# Patient Record
Sex: Male | Born: 1935 | Race: White | Hispanic: No | Smoking: Never smoker
Health system: Southern US, Community
[De-identification: ages and names within clinical notes are randomized; demographics above are authoritative.]

## PROBLEM LIST (undated history)

## (undated) DIAGNOSIS — E785 Hyperlipidemia, unspecified: Secondary | ICD-10-CM

## (undated) DIAGNOSIS — I499 Cardiac arrhythmia, unspecified: Secondary | ICD-10-CM

## (undated) DIAGNOSIS — C4490 Unspecified malignant neoplasm of skin, unspecified: Secondary | ICD-10-CM

## (undated) DIAGNOSIS — I2510 Atherosclerotic heart disease of native coronary artery without angina pectoris: Secondary | ICD-10-CM

## (undated) DIAGNOSIS — K219 Gastro-esophageal reflux disease without esophagitis: Secondary | ICD-10-CM

## (undated) DIAGNOSIS — I639 Cerebral infarction, unspecified: Secondary | ICD-10-CM

## (undated) DIAGNOSIS — E119 Type 2 diabetes mellitus without complications: Secondary | ICD-10-CM

## (undated) DIAGNOSIS — N200 Calculus of kidney: Secondary | ICD-10-CM

## (undated) DIAGNOSIS — L570 Actinic keratosis: Secondary | ICD-10-CM

## (undated) DIAGNOSIS — N400 Enlarged prostate without lower urinary tract symptoms: Secondary | ICD-10-CM

## (undated) DIAGNOSIS — N289 Disorder of kidney and ureter, unspecified: Secondary | ICD-10-CM

## (undated) DIAGNOSIS — I10 Essential (primary) hypertension: Secondary | ICD-10-CM

## (undated) HISTORY — DX: Gastro-esophageal reflux disease without esophagitis: K21.9

## (undated) HISTORY — DX: Benign prostatic hyperplasia without lower urinary tract symptoms: N40.0

## (undated) HISTORY — DX: Type 2 diabetes mellitus without complications: E11.9

## (undated) HISTORY — PX: OTHER SURGICAL HISTORY: SHX169

## (undated) HISTORY — DX: Hyperlipidemia, unspecified: E78.5

## (undated) HISTORY — DX: Disorder of kidney and ureter, unspecified: N28.9

## (undated) HISTORY — DX: Unspecified malignant neoplasm of skin, unspecified: C44.90

## (undated) HISTORY — DX: Cardiac arrhythmia, unspecified: I49.9

## (undated) HISTORY — DX: Cerebral infarction, unspecified: I63.9

## (undated) HISTORY — DX: Calculus of kidney: N20.0

## (undated) HISTORY — DX: Actinic keratosis: L57.0

## (undated) HISTORY — DX: Atherosclerotic heart disease of native coronary artery without angina pectoris: I25.10

## (undated) HISTORY — DX: Essential (primary) hypertension: I10

---

## 2000-06-29 ENCOUNTER — Encounter: Payer: Self-pay | Admitting: Gastroenterology

## 2000-06-29 ENCOUNTER — Ambulatory Visit (HOSPITAL_COMMUNITY): Admission: RE | Admit: 2000-06-29 | Discharge: 2000-06-29 | Payer: Self-pay | Admitting: Gastroenterology

## 2001-06-24 ENCOUNTER — Encounter: Payer: Self-pay | Admitting: Orthopedic Surgery

## 2001-06-24 ENCOUNTER — Encounter: Admission: RE | Admit: 2001-06-24 | Discharge: 2001-06-24 | Payer: Self-pay | Admitting: Orthopedic Surgery

## 2003-06-20 ENCOUNTER — Other Ambulatory Visit: Payer: Self-pay

## 2004-01-24 ENCOUNTER — Emergency Department: Payer: Self-pay | Admitting: Emergency Medicine

## 2004-01-24 ENCOUNTER — Other Ambulatory Visit: Payer: Self-pay

## 2004-01-31 ENCOUNTER — Ambulatory Visit: Payer: Self-pay | Admitting: Specialist

## 2004-04-21 ENCOUNTER — Emergency Department: Payer: Self-pay | Admitting: General Practice

## 2004-08-15 ENCOUNTER — Ambulatory Visit: Payer: Self-pay | Admitting: Specialist

## 2004-09-10 ENCOUNTER — Ambulatory Visit: Payer: Self-pay | Admitting: Specialist

## 2005-10-08 ENCOUNTER — Ambulatory Visit: Payer: Self-pay | Admitting: Gastroenterology

## 2006-12-17 ENCOUNTER — Other Ambulatory Visit: Payer: Self-pay

## 2006-12-17 ENCOUNTER — Emergency Department: Payer: Self-pay | Admitting: Emergency Medicine

## 2007-02-11 DIAGNOSIS — I2510 Atherosclerotic heart disease of native coronary artery without angina pectoris: Secondary | ICD-10-CM

## 2007-02-11 HISTORY — PX: CORONARY ARTERY BYPASS GRAFT: SHX141

## 2007-02-11 HISTORY — DX: Atherosclerotic heart disease of native coronary artery without angina pectoris: I25.10

## 2007-06-10 ENCOUNTER — Ambulatory Visit: Payer: Self-pay | Admitting: Urology

## 2007-09-20 ENCOUNTER — Encounter: Payer: Self-pay | Admitting: Cardiovascular Disease

## 2007-09-21 ENCOUNTER — Encounter: Payer: Self-pay | Admitting: Cardiovascular Disease

## 2007-09-22 ENCOUNTER — Encounter: Payer: Self-pay | Admitting: Cardiovascular Disease

## 2007-09-22 ENCOUNTER — Inpatient Hospital Stay: Payer: Self-pay | Admitting: Cardiovascular Disease

## 2007-10-27 ENCOUNTER — Encounter: Payer: Self-pay | Admitting: Cardiovascular Disease

## 2007-11-17 ENCOUNTER — Encounter: Payer: Self-pay | Admitting: Cardiovascular Disease

## 2007-12-12 ENCOUNTER — Encounter: Payer: Self-pay | Admitting: Cardiovascular Disease

## 2007-12-28 ENCOUNTER — Encounter: Payer: Self-pay | Admitting: Cardiovascular Disease

## 2008-01-11 ENCOUNTER — Emergency Department: Payer: Self-pay | Admitting: Emergency Medicine

## 2008-01-13 ENCOUNTER — Encounter: Payer: Self-pay | Admitting: Cardiovascular Disease

## 2008-01-17 ENCOUNTER — Encounter: Payer: Self-pay | Admitting: Cardiovascular Disease

## 2008-02-11 ENCOUNTER — Encounter: Payer: Self-pay | Admitting: Cardiovascular Disease

## 2009-02-26 ENCOUNTER — Encounter: Payer: Self-pay | Admitting: Cardiovascular Disease

## 2009-06-13 ENCOUNTER — Encounter: Payer: Self-pay | Admitting: Cardiovascular Disease

## 2009-11-29 ENCOUNTER — Encounter: Payer: Self-pay | Admitting: Cardiovascular Disease

## 2009-12-11 ENCOUNTER — Ambulatory Visit: Payer: Self-pay | Admitting: Cardiovascular Disease

## 2009-12-11 DIAGNOSIS — I2510 Atherosclerotic heart disease of native coronary artery without angina pectoris: Secondary | ICD-10-CM | POA: Insufficient documentation

## 2009-12-11 DIAGNOSIS — I25810 Atherosclerosis of coronary artery bypass graft(s) without angina pectoris: Secondary | ICD-10-CM | POA: Insufficient documentation

## 2009-12-11 DIAGNOSIS — E785 Hyperlipidemia, unspecified: Secondary | ICD-10-CM | POA: Insufficient documentation

## 2009-12-11 DIAGNOSIS — I10 Essential (primary) hypertension: Secondary | ICD-10-CM | POA: Insufficient documentation

## 2010-01-10 ENCOUNTER — Ambulatory Visit: Payer: Self-pay | Admitting: Cardiovascular Disease

## 2010-01-14 LAB — CONVERTED CEMR LAB
ALT: 27 units/L (ref 0–53)
AST: 28 units/L (ref 0–37)
Albumin: 4.4 g/dL (ref 3.5–5.2)
Alkaline Phosphatase: 64 units/L (ref 39–117)
Bilirubin, Direct: 0.1 mg/dL (ref 0.0–0.3)
Cholesterol: 125 mg/dL (ref 0–200)
HDL: 36 mg/dL — ABNORMAL LOW (ref >39)
Indirect Bilirubin: 0.5 mg/dL (ref 0.0–0.9)
LDL Cholesterol: 63 mg/dL (ref 0–99)
Total Bilirubin: 0.6 mg/dL (ref 0.3–1.2)
Total CHOL/HDL Ratio: 3.5
Total Protein: 7.2 g/dL (ref 6.0–8.3)
Triglycerides: 131 mg/dL (ref <150)
VLDL: 26 mg/dL (ref 0–40)

## 2010-03-12 NOTE — Letter (Signed)
Summary: Medical Record Release  Medical Record Release   Imported By: Harlon Flor 11/29/2009 11:47:08  _____________________________________________________________________  External Attachment:    Type:   Image     Comment:   External Document

## 2010-03-12 NOTE — Letter (Signed)
Summary: Medical Record Release  Medical Record Release   Imported By: Harlon Flor 12/19/2009 16:12:22  _____________________________________________________________________  External Attachment:    Type:   Image     Comment:   External Document

## 2010-03-12 NOTE — Cardiovascular Report (Signed)
[facility]   Imported By: Roderic Ovens 12/27/2009 17:34:18  _____________________________________________________________________  External Attachment:    Type:   Image     Comment:   External Document

## 2010-03-12 NOTE — Assessment & Plan Note (Signed)
Summary: NP6/AMD   Visit Type:  Initial Consult   History of Present Illness: Randy Frazier is a very pleasant 77 year old gentleman with a history of coronary artery disease, bypass surgery in 2009 at [facility], history of hyperlipidemia and hypertension who presents for new patient evaluation.  Randy Frazier states that he was seen previously by the outside cardiologist and would like to change to her group for his continued care.  Cardiac catheterization notes from August 2009 shows a proximal left main lesion of 40%, proximal LAD lesion 60%, mid LAD lesion 95%, distal LAD lesion 70%, proximal circumflex and 60% followed by a 90% lesion with 99% proximal RCA disease, 70% distal RCA disease, 100% stenosis of the PDA vessel. He was transferred to [facility] for bypass surgery previous records are not available to [place] at this time.  Since his surgery, he reports that he's been doing well. He leads a sedentary lifestyle. His blood pressure has been relatively well controlled with systolic pressures in the low 161W to high 130s. He had some muscle cramping on lovastatin and one month ago changed to pravastatin.  Echocardiogram from December 2009 shows mild mitral regurgitation, mild pulmonic regurgitation, trace TR, otherwise normal LV size and systolic function with mild LVH and mild diastolic dysfunction.  Stress test in August 2000 Prior to his bypass surgery shows ischemia in the inferolateral wall, ejection fraction 73%.  EKG shows normal sinus rhythm with rate 61 beats per minute, right bundle branch block, no significant ST-T wave changes  Labs from January 2000 and shows hemoglobin A1c 8.2, total cholesterol 102, LDL 44, HDL 29, creatinine 1.69, BUN 30  Preventive Screening-Counseling & Management  Alcohol-Tobacco     Smoking Status: never  Caffeine-Diet-Exercise     Does Patient Exercise: no      Drug Use:  no.    Current Medications (verified): 1)  Aspirin 325 Mg Tabs (Aspirin)  .... One Tablet Once Daily 2)  Coq10 50 Mg Caps (Coenzyme Q10) .... One Tablet Once Daily 3)  Enalapril Maleate 10 Mg Tabs (Enalapril Maleate) .... One Tablet Once Daily 4)  Magnesium 250 Mg Tabs (Magnesium) .... One Tablet Once Daily 5)  Metformin Hcl 500 Mg Tabs (Metformin Hcl) .... One Tablet Two Times A Day 6)  Metoprolol Succinate 50 Mg Xr24h-Tab (Metoprolol Succinate) .... One Tablet Once Daily 7)  Omeprazole 40 Mg Cpdr (Omeprazole) .... One Tablet Once Daily 8)  Pravastatin Sodium 40 Mg Tabs (Pravastatin Sodium) .... One Tablet At Bedtime 9)  Amlodipine Besylate 10 Mg Tabs (Amlodipine Besylate) .... One Tablet Once Daily  Allergies (verified): 1)  ! Ibuprofen 2)  ! Penicillin  Past History:  Family History: Last updated: 2009-12-23 Mother: Deceased @ age 40 with cancer Father: Deceased @ age 28 with hemorrhage  Social History: Last updated: 12-23-09 Retired  Married  Tobacco Use - No.  Alcohol Use - no Regular Exercise - no Drug Use - no  Risk Factors: Exercise: no (Dec 23, 2009)  Risk Factors: Smoking Status: never (2009-12-23)  Past Medical History: A-Fib CAD; CABG x 3 @ [facility] Hypertension GERD Renal disorder 1.69 creatinine Enlarged prostate  Past Surgical History: CABG x 3 @ [facility]; 2009   Family History: Mother: Deceased @ age 73 with cancer Father: Deceased @ age 23 with hemorrhage  Social History: Retired  Married  Tobacco Use - No.  Alcohol Use - no Regular Exercise - no Drug Use - no Smoking Status:  never Does Patient Exercise:  no Drug Use:  no  Review of Systems  The patient denies fever, weight loss, weight gain, vision loss, decreased hearing, hoarseness, chest pain, syncope, dyspnea on exertion, peripheral edema, prolonged cough, abdominal pain, incontinence, muscle weakness, depression, and enlarged lymph nodes.    Vital Signs:  Patient profile:   77 year old male Height:      72 inches Weight:      180 pounds BMI:      24.50 Pulse rate:   68 / minute BP sitting:   151 / 85  (left arm) Cuff size:   regular  Vitals Entered By: Bishop Dublin, CMA (December 11, 2009 10:37 AM)  Physical Exam  General:  Well developed, well nourished, in no acute distress. Head:  normocephalic and atraumatic Neck:  Neck supple, no JVD. No masses, thyromegaly or abnormal cervical nodes. Lungs:  Clear bilaterally to auscultation and percussion. Heart:  Non-displaced PMI, chest non-tender; regular rate and rhythm, S1, S2 without murmurs, rubs or gallops. Carotid upstroke normal, no bruit. Normal abdominal aortic size, no bruits. Pedals normal pulses. No edema, no varicosities. Abdomen:  Bowel sounds positive; abdomen soft and non-tender  Msk:  Back normal, normal gait. Muscle strength and tone normal. Pulses:  pulses normal in all 4 extremities Extremities:  No clubbing or cyanosis. Neurologic:  Alert and oriented x 3. Skin:  Intact without lesions or rashes. Psych:  Normal affect.   Impression & Recommendations:  Problem # 1:  CAD, AUTOLOGOUS BYPASS GRAFT (ICD-414.02) severe three-vessel CAD by catheterization in 2009 with bypass surgery at [facility] at that time. No symptoms over the past 2 years. We'll continue aggressive medical management.  His updated medication list for this problem includes:    Aspirin 81 Mg Tbec (Aspirin) .[name]... Take 2  tablets by mouth daily    Enalapril Maleate 10 Mg Tabs (Enalapril maleate) ..... One tablet once daily    Metoprolol Succinate 50 Mg Xr24h-tab (Metoprolol succinate) ..... One tablet once daily    Amlodipine Besylate 10 Mg Tabs (Amlodipine besylate) ..... One tablet once daily  Problem # 2:  HYPERLIPIDEMIA-MIXED (ICD-272.4) Goal LDL is less than 70. We will have him continue on pravastatin for one more month and check his cholesterol at that time. It is not goal, we can change him to Lipitor.  The following medications were removed from the medication list:    Lovastatin 40 Mg  Tabs (Lovastatin) ..... One tablet at bedtime His updated medication list for this problem includes:    Pravastatin Sodium 40 Mg Tabs (Pravastatin sodium) ..... One tablet at bedtime  Problem # 3:  HYPERTENSION, BENIGN (ICD-401.1) I have asked him to monitor his blood pressure. He comments that his stock pressures are typically 140.  His updated medication list for this problem includes:    Aspirin 81 Mg Tbec (Aspirin) .[name]... Take 2  tablets by mouth daily    Enalapril Maleate 10 Mg Tabs (Enalapril maleate) ..... One tablet once daily    Metoprolol Succinate 50 Mg Xr24h-tab (Metoprolol succinate) ..... One tablet once daily    Amlodipine Besylate 10 Mg Tabs (Amlodipine besylate) ..... One tablet once daily  Other Orders: EKG w/ Interpretation (93000)  Patient Instructions: 1)  Your physician recommends that you return for a FASTING lipid profile: 1 month (lipid/lft) 2)  Your physician has recommended you make the following change in your medication: Decrease aspirin 81mg  take 2 tabs daily 3)  Your physician wants you to follow-up in:   6 months You will receive a reminder letter in  the mail two months in advance. If you don't receive a letter, please call our office to schedule the follow-up appointment.

## 2010-05-20 ENCOUNTER — Other Ambulatory Visit: Payer: Self-pay | Admitting: Emergency Medicine

## 2010-05-20 MED ORDER — AMLODIPINE BESYLATE 10 MG PO TABS: 10.0000 mg | ORAL_TABLET | Freq: Every day | ORAL | Status: DC

## 2010-06-19 ENCOUNTER — Encounter: Payer: Self-pay | Admitting: Cardiovascular Disease

## 2010-06-19 ENCOUNTER — Ambulatory Visit (INDEPENDENT_AMBULATORY_CARE_PROVIDER_SITE_OTHER): Payer: Medicare Other | Admitting: Cardiovascular Disease

## 2010-06-19 DIAGNOSIS — E785 Hyperlipidemia, unspecified: Secondary | ICD-10-CM

## 2010-06-19 DIAGNOSIS — I10 Essential (primary) hypertension: Secondary | ICD-10-CM

## 2010-06-19 DIAGNOSIS — E119 Type 2 diabetes mellitus without complications: Secondary | ICD-10-CM | POA: Insufficient documentation

## 2010-06-19 DIAGNOSIS — I2510 Atherosclerotic heart disease of native coronary artery without angina pectoris: Secondary | ICD-10-CM

## 2010-06-19 DIAGNOSIS — I25810 Atherosclerosis of coronary artery bypass graft(s) without angina pectoris: Secondary | ICD-10-CM

## 2010-06-19 MED ORDER — ENALAPRIL MALEATE 20 MG PO TABS: 20.0000 mg | ORAL_TABLET | Freq: Every day | ORAL | Status: DC

## 2010-06-19 NOTE — Assessment & Plan Note (Signed)
Blood pressure is well controlled on today's visit. No changes made to the medications. He would like to change his p.m. Enalapril to AM. We'll change the a.m. Dose to 20 mg daily.

## 2010-06-19 NOTE — Progress Notes (Signed)
Patient ID: Randy Frazier, male    DOB: 03-18-1927, 77 y.o.   MRN: [id number]  HPI Comments: Randy Frazier is a very pleasant 77 year old gentleman with a history of coronary artery disease, bypass surgery in 2009 at [facility], poorly controlled diabetes, history of hyperlipidemia and hypertension who presents for routine followup.   Since his surgery, he reports that he's been doing well. He leads a sedentary lifestyle. His blood pressure has been relatively well controlled with systolic pressures in the low 409W to high 130s. He had some muscle cramping on lovastatin and  changed to pravastatin. On pravastatin, he has without muscle ache and has cut the pill in half. He does not exercise and reports eating poorly. He believes his diabetes numbers will be elevated. He also reports having vivid dreams at night and wonders if it could be one of the medications.   Cardiac catheterization notes from August 2009 shows a proximal left main lesion of 40%, proximal LAD lesion 60%, mid LAD lesion 95%, distal LAD lesion 70%, proximal circumflex and 60% followed by a 90% lesion with 99% proximal RCA disease, 70% distal RCA disease, 100% stenosis of the PDA vessel. He was transferred to [facility] for bypass surgery previous records are not available to [place] at this time.   Echocardiogram from December 2009 shows mild mitral regurgitation, mild pulmonic regurgitation, trace TR, otherwise normal LV size and systolic function with mild LVH and mild diastolic dysfunction.   EKG shows normal sinus rhythm with rate 58 beats per minute, right bundle branch block, no significant ST-T wave changes   Labs from January 2011 and shows hemoglobin A1c 8.2, total cholesterol 102, LDL 44, HDL 29, creatinine 1.69, BUN 30      Review of Systems  Constitutional: Negative.        Nightmares when sleeping at night.  HENT: Negative.   Eyes: Negative.   Respiratory: Negative.   Cardiovascular: Negative.     Gastrointestinal: Negative.   Musculoskeletal: Negative.   Skin: Negative.   Neurological: Negative.   Hematological: Negative.   Psychiatric/Behavioral: Negative.   All other systems reviewed and are negative.   BP 118/68  Pulse 58  Ht 6' (1.829 m)  Wt 179 lb 12.8 oz (81.557 kg)  BMI 24.39 kg/m2   Physical Exam  Nursing note and vitals reviewed. Constitutional: He is oriented to person, place, and time. He appears well-developed and well-nourished.  HENT:  Head: Normocephalic.  Nose: Nose normal.  Mouth/Throat: Oropharynx is clear and moist.  Eyes: Conjunctivae are normal. Pupils are equal, round, and reactive to light.  Neck: Normal range of motion. Neck supple. No JVD present.  Cardiovascular: Normal rate, regular rhythm, S1 normal, S2 normal, normal heart sounds and intact distal pulses.  Exam reveals no gallop and no friction rub.   No murmur heard. Pulmonary/Chest: Effort normal and breath sounds normal. No respiratory distress. He has no wheezes. He has no rales. He exhibits no tenderness.  Abdominal: Soft. Bowel sounds are normal. He exhibits no distension. There is no tenderness.  Musculoskeletal: Normal range of motion. He exhibits no edema and no tenderness.  Lymphadenopathy:    He has no cervical adenopathy.  Neurological: He is alert and oriented to person, place, and time. Coordination normal.  Skin: Skin is warm and dry. No rash noted. No erythema.  Psychiatric: He has a normal mood and affect. His behavior is normal. Judgment and thought content normal.  Assessment and Plan

## 2010-06-19 NOTE — Assessment & Plan Note (Signed)
We have encouraged continued exercise, careful diet management in an effort to lose weight. He needs to check his sugars at home. He reports that he has the supplies that does not do it. He also eats junk food.

## 2010-06-19 NOTE — Patient Instructions (Signed)
Your physician has recommended you make the following change in your medication: Change Enalapril to 20mg  in AM only. We will send this Rx to [facility] pharmacy.  Your physician recommends that you return for a FASTING lipid profile: in 1 week (lipid/lft/HgB A1c/BMP)  Your physician recommends that you schedule a follow-up appointment in: 6 months

## 2010-06-19 NOTE — Assessment & Plan Note (Signed)
We'll schedule a cholesterol check for next week. We'll also check hemoglobin A1c

## 2010-06-19 NOTE — Assessment & Plan Note (Signed)
Currently with no symptoms of angina. No further workup at this time. Continue current medication regimen. 

## 2010-06-26 ENCOUNTER — Other Ambulatory Visit (INDEPENDENT_AMBULATORY_CARE_PROVIDER_SITE_OTHER): Payer: Medicare Other | Admitting: *Deleted

## 2010-06-26 DIAGNOSIS — E119 Type 2 diabetes mellitus without complications: Secondary | ICD-10-CM

## 2010-06-26 DIAGNOSIS — E785 Hyperlipidemia, unspecified: Secondary | ICD-10-CM

## 2010-06-26 DIAGNOSIS — I2510 Atherosclerotic heart disease of native coronary artery without angina pectoris: Secondary | ICD-10-CM

## 2010-06-26 DIAGNOSIS — Z79899 Other long term (current) drug therapy: Secondary | ICD-10-CM

## 2010-06-26 LAB — BASIC METABOLIC PANEL
CO2: 24 mEq/L (ref 19–32)
Glucose, Bld: 129 mg/dL — ABNORMAL HIGH (ref 70–99)
Potassium: 4.8 mEq/L (ref 3.5–5.3)
Sodium: 137 mEq/L (ref 135–145)

## 2010-06-26 LAB — LIPID PANEL
Cholesterol: 137 mg/dL (ref 0–200)
HDL: 36 mg/dL — ABNORMAL LOW (ref >39)
Total CHOL/HDL Ratio: 3.8 Ratio
Triglycerides: 123 mg/dL (ref <150)
VLDL: 25 mg/dL (ref 0–40)

## 2010-06-26 LAB — HEPATIC FUNCTION PANEL
ALT: 27 U/L (ref 0–53)
AST: 34 U/L (ref 0–37)
Albumin: 4 g/dL (ref 3.5–5.2)
Total Protein: 6.6 g/dL (ref 6.0–8.3)

## 2010-06-27 LAB — HEMOGLOBIN A1C: Hgb A1c MFr Bld: 7.2 % — ABNORMAL HIGH (ref <5.7)

## 2010-06-28 NOTE — Procedures (Signed)
[place]  Patient:    EKANSH, SHERK.                  MRN: [id number] Proc. Date: 07/01/00 Attending:  Verlin Grills, M.D. CC:         [facility], [place]   Procedure Report  PROCEDURE:  Esophagogastroduodenoscopy with Savary esophageal dilation.  PROCEDURE INDICATION:  Mr. Terrez Ander (date of birth 03-23-1927) is a 77 year old male with chronic heartburn.  He takes his proton pump inhibitor intermittently.  He is complaining of heartburn and nausea.  I discussed with Mr. Gelb the complications associated with esophagogastroduodenoscopy and possible Savary esophageal dilation, including intestinal bleeding and intestinal perforation.  Mr. Tendler has signed the operative permit.  ENDOSCOPIST:  Verlin Grills, M.D.  PREMEDICATION:  Fentanyl 50 mcg, Versed 6 mg.  ENDOSCOPE:  Olympus gastroscope and the Savary 16 mm dilator.  DESCRIPTION OF PROCEDURE:  After obtaining informed consent, the patient was placed in the left lateral decubitus position.  I administered intravenous fentanyl and intravenous Versed to achieve conscious sedation for the procedure.  The patients blood pressure, oxygen saturation, and cardiac rhythm were monitored throughout the procedure and documented in the medial record.  The Olympus gastroscope was passed through the posterior hypopharynx into the proximal esophagus without difficulty.  The hypopharynx, larynx, and vocal cords appeared normal.  Esophagoscopy:  The proximal and midsegments of the esophagus appeared normal. 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  With steady light pressure, the mucosal stricture was slightly dilated with the gastroscope, allowing the gastroscope to enter the proximal stomach.  There is circumferential erosive esophagitis at the esophagogastric junction.  There is no ulceration of the  esophagus.  Gastroscopy:  There is a moderate-sized hiatal hernia.  Retroflexed view of the gastric cardia and fundus was normal.  The gastric body, antrum, and pylorus appeared normal.  Duodenoscopy:  There are scattered erosions in the duodenal bulb without ulceration; the descending duodenum appeared normal.  Savary esophageal dilation:  The Savary dilator wire was passed through the endoscope and the tip of the guidewire advanced to the distal gastric antrum as confirmed endoscopically and fluoroscopically.  Under fluoroscopic guidance, the 16 mm Savary dilator passed with minimal resistance.  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  RECOMMENDATIONS:  I have encouraged Mr. Burmester to take his proton pump inhibitor on a daily basis to prevent heartburn and hopefully relieve his nausea. DD:  06/29/00 TD:  06/30/00 Job: [id number]

## 2010-06-30 ENCOUNTER — Encounter: Payer: Self-pay | Admitting: Cardiovascular Disease

## 2010-07-15 ENCOUNTER — Telehealth: Payer: Self-pay | Admitting: Emergency Medicine

## 2010-07-15 NOTE — Telephone Encounter (Signed)
Pt called and left a message stating that he has questions about his pravastatin. Tried to call patient and LMOM TCB

## 2010-07-16 NOTE — Telephone Encounter (Signed)
Pt called the office regarding his pravastatin and stated that he has bad muscle cramps. He said that he is going to stop this medication due to this problem. Also he stated that you mentioned trying him on lipitor(generic) and he wanted to know if he can switch to this medication. If so what dose? Please advise, Thanks, Leemon Ayala

## 2010-07-18 NOTE — Telephone Encounter (Signed)
Would start lowest dose to start 10 mg daily Would check cholesterol in 2 months

## 2010-07-19 NOTE — Telephone Encounter (Signed)
Attempted to call pt, no answer. x1

## 2010-07-23 NOTE — Telephone Encounter (Signed)
Pt called back stating the medication Dr. Leavy Cella put him on was Mirapex (Ropinirole HCL) usually used for Parkinson's dz, he wants him to try this prior to restarting chol med to see if improves muscle spasms. Please advise.

## 2010-07-23 NOTE — Telephone Encounter (Signed)
Spoke to pt this AM, he states he saw Dr. Leavy Cella in the meantime, and was told to hold Pravastatin for 30 days. Dr. Leavy Cella prescribed a medication (pt unsure of name he has not picked up from pharmacy yet) to help with his muscle cramps. Pt states that since stopping Pravastatin his cramps have worsened and he is having spasms "all over body." Pt does not want to decr dose, he does want to hold for now and he states he will call me back to let me know what medication he has started. Notified him I would discuss with Dr. Mariah Milling after we know what he is taking.

## 2010-07-24 NOTE — Telephone Encounter (Signed)
Sounds like he can try the other medication first. Will defer to Dr. Leavy Cella.

## 2010-07-25 NOTE — Telephone Encounter (Signed)
Noted. Will follow up with pt to find out if his symptoms have improved after a few weeks.

## 2010-08-27 NOTE — Telephone Encounter (Signed)
Spoke to pt after he has been on Mirapex as mentioned below, his muscle spasms have improved some but not gone. Told pt that this shows it was not the chol med causing most likely, and advised he restart Pravastatin 40mg  again, may want to try 1/2 tab to start. He says Dr. Mariah Milling had also mentioned in the past trying Lipitor 10mg , and would like to see if he recommends instead of restarting prava. Please advise.

## 2010-08-28 NOTE — Telephone Encounter (Signed)
He could try either pravastatin or lipitor 10 mg daily

## 2010-08-29 NOTE — Telephone Encounter (Signed)
Attempted to contact pt, LMOM TCB. Please call pt and notify. Thanks.

## 2010-08-30 NOTE — Telephone Encounter (Signed)
LMOMTCB

## 2010-09-03 ENCOUNTER — Other Ambulatory Visit: Payer: Self-pay

## 2010-09-03 MED ORDER — ROSUVASTATIN CALCIUM 5 MG PO TABS: 5.0000 mg | ORAL_TABLET | Freq: Every day | ORAL | Status: DC

## 2010-09-03 NOTE — Telephone Encounter (Signed)
Notified Dr. Mariah Milling the patient is having cramping from the pravastatin.  Dr. Mariah Milling had patient pick up crestor 5 mg samples to take one tablet daily.

## 2010-10-23 ENCOUNTER — Telehealth: Payer: Self-pay | Admitting: Cardiovascular Disease

## 2010-10-23 NOTE — Telephone Encounter (Signed)
Please see below.

## 2010-10-23 NOTE — Telephone Encounter (Signed)
Pt wants to have a script for Lipitor (generic 10mg ). Pt uses [facility] [address]

## 2010-10-28 ENCOUNTER — Telehealth: Payer: Self-pay

## 2010-10-28 NOTE — Telephone Encounter (Signed)
Duplicate message. 

## 2010-11-05 NOTE — Telephone Encounter (Signed)
Please advise if okay to take generic lipitor. If so, what dose do you recommend? If okay send to [facility].

## 2010-11-06 NOTE — Telephone Encounter (Signed)
Would start lipitor 10 mg. Check lipids in 3 months May need 20 mg daily to match crestor numbers from May

## 2010-11-07 ENCOUNTER — Other Ambulatory Visit: Payer: Self-pay

## 2010-11-07 DIAGNOSIS — E785 Hyperlipidemia, unspecified: Secondary | ICD-10-CM

## 2010-11-07 DIAGNOSIS — I2510 Atherosclerotic heart disease of native coronary artery without angina pectoris: Secondary | ICD-10-CM

## 2010-11-07 MED ORDER — ATORVASTATIN CALCIUM 10 MG PO TABS: 10.0000 mg | ORAL_TABLET | Freq: Every day | ORAL | Status: DC

## 2010-11-07 NOTE — Telephone Encounter (Signed)
Notified patient need to start on lipitor 10 mg one tablet daily.  Reck liv/lip in three months.

## 2010-12-12 ENCOUNTER — Other Ambulatory Visit: Payer: Self-pay

## 2010-12-12 MED ORDER — AMLODIPINE BESYLATE 10 MG PO TABS: 10.0000 mg | ORAL_TABLET | Freq: Every day | ORAL | Status: DC

## 2011-02-01 ENCOUNTER — Other Ambulatory Visit: Payer: Self-pay | Admitting: Cardiovascular Disease

## 2011-02-03 ENCOUNTER — Telehealth: Payer: Self-pay

## 2011-02-03 MED ORDER — ENALAPRIL MALEATE 20 MG PO TABS: 20.0000 mg | ORAL_TABLET | Freq: Every day | ORAL | Status: DC

## 2011-02-03 NOTE — Telephone Encounter (Signed)
Refill sent for enalapril  

## 2011-02-06 ENCOUNTER — Ambulatory Visit (INDEPENDENT_AMBULATORY_CARE_PROVIDER_SITE_OTHER): Payer: Medicare Other | Admitting: *Deleted

## 2011-02-06 DIAGNOSIS — I2510 Atherosclerotic heart disease of native coronary artery without angina pectoris: Secondary | ICD-10-CM

## 2011-02-06 DIAGNOSIS — E785 Hyperlipidemia, unspecified: Secondary | ICD-10-CM

## 2011-02-06 LAB — HEPATIC FUNCTION PANEL
AST: 18 U/L (ref 0–37)
Albumin: 4.3 g/dL (ref 3.5–5.2)
Alkaline Phosphatase: 53 U/L (ref 39–117)
Total Protein: 6.8 g/dL (ref 6.0–8.3)

## 2011-02-06 LAB — HEMOGLOBIN A1C
Hgb A1c MFr Bld: 6.5 % — ABNORMAL HIGH (ref <5.7)
Mean Plasma Glucose: 140 mg/dL — ABNORMAL HIGH (ref <117)

## 2011-02-06 LAB — LIPID PANEL
LDL Cholesterol: 78 mg/dL (ref 0–99)
Total CHOL/HDL Ratio: 4 Ratio

## 2011-02-19 ENCOUNTER — Other Ambulatory Visit: Payer: Self-pay | Admitting: Cardiovascular Disease

## 2011-02-19 DIAGNOSIS — E785 Hyperlipidemia, unspecified: Secondary | ICD-10-CM

## 2011-06-19 DIAGNOSIS — I10 Essential (primary) hypertension: Secondary | ICD-10-CM | POA: Insufficient documentation

## 2011-06-19 DIAGNOSIS — N400 Enlarged prostate without lower urinary tract symptoms: Secondary | ICD-10-CM | POA: Insufficient documentation

## 2011-07-14 ENCOUNTER — Other Ambulatory Visit: Payer: Self-pay | Admitting: Cardiovascular Disease

## 2011-07-14 NOTE — Telephone Encounter (Signed)
Refilled Lipitor

## 2011-07-31 ENCOUNTER — Encounter: Payer: Self-pay | Admitting: Cardiovascular Disease

## 2011-07-31 ENCOUNTER — Ambulatory Visit (INDEPENDENT_AMBULATORY_CARE_PROVIDER_SITE_OTHER): Payer: Medicare Other | Admitting: Cardiovascular Disease

## 2011-07-31 VITALS — BP 102/52 | HR 59 | Ht 72.0 in | Wt 180.5 lb

## 2011-07-31 DIAGNOSIS — I2510 Atherosclerotic heart disease of native coronary artery without angina pectoris: Secondary | ICD-10-CM

## 2011-07-31 DIAGNOSIS — F3289 Other specified depressive episodes: Secondary | ICD-10-CM | POA: Insufficient documentation

## 2011-07-31 DIAGNOSIS — F329 Major depressive disorder, single episode, unspecified: Secondary | ICD-10-CM | POA: Insufficient documentation

## 2011-07-31 DIAGNOSIS — I10 Essential (primary) hypertension: Secondary | ICD-10-CM

## 2011-07-31 DIAGNOSIS — E119 Type 2 diabetes mellitus without complications: Secondary | ICD-10-CM

## 2011-07-31 DIAGNOSIS — E785 Hyperlipidemia, unspecified: Secondary | ICD-10-CM

## 2011-07-31 DIAGNOSIS — I25810 Atherosclerosis of coronary artery bypass graft(s) without angina pectoris: Secondary | ICD-10-CM

## 2011-07-31 NOTE — Patient Instructions (Addendum)
You are doing well. No medication changes were made.  Please call us if you have new issues that need to be addressed before your next appt.  Your physician wants you to follow-up in: 6 months.  You will receive a reminder letter in the mail two months in advance. If you don't receive a letter, please call our office to schedule the follow-up appointment.   

## 2011-07-31 NOTE — Assessment & Plan Note (Signed)
Cholesterol is at goal on the current lipid regimen. No changes to the medications were made.  

## 2011-07-31 NOTE — Assessment & Plan Note (Signed)
We have encouraged continued exercise, careful diet management in an effort to lose weight. 

## 2011-07-31 NOTE — Progress Notes (Signed)
Patient ID: Randy Frazier, male    DOB: 1927/10/30, 78 y.o.   MRN: [id number]  HPI Comments: Mr. Strole is a very pleasant 78 year old gentleman with a history of coronary artery disease, bypass surgery in 2009 at [facility], poorly controlled diabetes, history of hyperlipidemia and hypertension who presents for routine followup.  He reports that he is depressed after the recent loss of his wife in the hospital. She had a stroke, finally succumbed to pneumonia. In the past, he did not tolerate lovastatin secondary to cramping and was changed to pravastatin. He decreased the dose of pravastatin. He does not exercise, eats poorly. He has problems with diabetes secondary to poor diet. In the past he has had vivid dreams. He denies any chest pain or shortness of breath. He does not do very much as he reports having no hobbies since his wife passed away  Recent lab work shows total cholesterol 134, LDL 70, HDL 35 labs from May 2013 Hemoglobin A1c 7.2   Cardiac catheterization notes from August 2009 shows a proximal left main lesion of 40%, proximal LAD lesion 60%, mid LAD lesion 95%, distal LAD lesion 70%, proximal circumflex and 60% followed by a 90% lesion with 99% proximal RCA disease, 70% distal RCA disease, 100% stenosis of the PDA vessel. He was transferred to [facility] for bypass surgery previous records are not available to [place] at this time.   Echocardiogram from December 2009 shows mild mitral regurgitation, mild pulmonic regurgitation, trace TR, otherwise normal LV size and systolic function with mild LVH and mild diastolic dysfunction.   EKG shows normal sinus rhythm with rate 59 beats per minute, right bundle branch block, no significant ST-T wave changes    Outpatient Encounter Prescriptions as of 76/20/2013  Medication Sig Dispense Refill  . amLODipine (NORVASC) 10 MG tablet Take 1 tablet (10 mg total) by mouth daily.  30 tablet  6  . aspirin 162 MG EC tablet 162 mg daily.      [name]  atorvastatin (LIPITOR) 10 MG tablet TAKE 1 TABLET BY MOUTH EVERY DAY  30 tablet  3  . enalapril (VASOTEC) 20 MG tablet Take 1 tablet (20 mg total) by mouth daily with breakfast.  30 tablet  6  . Fluoxetine HCl, PMDD, 20 MG CAPS Take 20 mg by mouth daily.      . metFORMIN (GLUCOPHAGE) 500 MG tablet Take 500 mg by mouth 2 (two) times daily with a meal.        . metoprolol tartrate (LOPRESSOR) 25 MG tablet Take 25 mg by mouth 2 (two) times daily.        [name] omeprazole (PRILOSEC) 40 MG capsule Take 40 mg by mouth daily.        . Tamsulosin HCl (FLOMAX) 0.4 MG CAPS Take 0.4 mg by mouth daily.       Review of Systems  Constitutional: Negative.   HENT: Negative.   Eyes: Negative.   Respiratory: Negative.   Cardiovascular: Negative.   Gastrointestinal: Negative.   Musculoskeletal: Negative.   Skin: Negative.   Neurological: Negative.   Hematological: Negative.   Psychiatric/Behavioral: Positive for dysphoric mood.  All other systems reviewed and are negative.   BP 102/52  Pulse 59  Ht 6' (1.829 m)  Wt 180 lb 8 oz (81.874 kg)  BMI 24.48 kg/m2  Physical Exam  Nursing note and vitals reviewed. Constitutional: He is oriented to person, place, and time. He appears well-developed and well-nourished.  HENT:  Head: Normocephalic.  Nose:  Nose normal.  Mouth/Throat: Oropharynx is clear and moist.  Eyes: Conjunctivae are normal. Pupils are equal, round, and reactive to light.  Neck: Normal range of motion. Neck supple. No JVD present.  Cardiovascular: Normal rate, regular rhythm, S1 normal, S2 normal, normal heart sounds and intact distal pulses.  Exam reveals no gallop and no friction rub.   No murmur heard. Pulmonary/Chest: Effort normal and breath sounds normal. No respiratory distress. He has no wheezes. He has no rales. He exhibits no tenderness.  Abdominal: Soft. Bowel sounds are normal. He exhibits no distension. There is no tenderness.  Musculoskeletal: Normal range of motion. He  exhibits no edema and no tenderness.  Lymphadenopathy:    He has no cervical adenopathy.  Neurological: He is alert and oriented to person, place, and time. Coordination normal.  Skin: Skin is warm and dry. No rash noted. No erythema.  Psychiatric: He has a normal mood and affect. His behavior is normal. Judgment and thought content normal.           Assessment and Plan

## 2011-07-31 NOTE — Assessment & Plan Note (Addendum)
He is currently on Prozac. He does not like being on medications for depression but feels he is very depressed. We have encouraged him to restart exercise program. We have also encouraged him to find new hobbies. He has significant stress from a pending court case with the hospital.

## 2011-07-31 NOTE — Assessment & Plan Note (Signed)
Currently with no symptoms of angina. No further workup at this time. Continue current medication regimen. 

## 2011-07-31 NOTE — Assessment & Plan Note (Signed)
Blood pressure is well controlled on today's visit. No changes made to the medications. 

## 2011-08-08 ENCOUNTER — Other Ambulatory Visit: Payer: Self-pay | Admitting: Cardiovascular Disease

## 2011-08-08 NOTE — Telephone Encounter (Signed)
Refilled Amlodipine.

## 2011-09-01 ENCOUNTER — Other Ambulatory Visit: Payer: Self-pay | Admitting: Cardiovascular Disease

## 2011-09-01 NOTE — Telephone Encounter (Signed)
Refilled Enalapril

## 2011-11-10 ENCOUNTER — Other Ambulatory Visit: Payer: Self-pay | Admitting: Cardiovascular Disease

## 2011-11-10 ENCOUNTER — Telehealth: Payer: Self-pay

## 2011-11-10 NOTE — Telephone Encounter (Signed)
See below as to what I told pt Let me know if I need to do anything different Thanks

## 2011-11-10 NOTE — Telephone Encounter (Signed)
Pt calling with c/o left arm/neck/hand numbness/tingling. He says this has been occuring x 2 days. From elbow to hand, he feels it is "asleep".  He says he is able to get relief when he manipulates area by rubbing on it.  He denies c/o cp, sob, or dizziness. He has a hx of CABG but did not have any symptoms prior to surg. He is asking if he should see Dr. Mariah Milling for this or his PCP. He says he thinks it is a "pinched nerve". I reassured him, since he is able to get relief from movement/manipulation, it is probably non cardiac and should f/u with PCP first. I instructed him to let us know ASAP should he continue to have symptoms/not be able to see PCP and we would address. Understanding verb. He will call PCP now.

## 2011-11-11 ENCOUNTER — Other Ambulatory Visit: Payer: Self-pay | Admitting: *Deleted

## 2011-11-11 MED ORDER — ATORVASTATIN CALCIUM 10 MG PO TABS: 10.0000 mg | ORAL_TABLET | Freq: Every day | ORAL | Status: DC

## 2011-11-11 NOTE — Telephone Encounter (Signed)
Refilled Atorvastatin.

## 2012-01-14 ENCOUNTER — Other Ambulatory Visit: Payer: Self-pay | Admitting: *Deleted

## 2012-01-14 MED ORDER — AMLODIPINE BESYLATE 10 MG PO TABS: 10.0000 mg | ORAL_TABLET | Freq: Every day | ORAL | Status: DC

## 2012-01-14 NOTE — Telephone Encounter (Signed)
Refilled Amlodipine.

## 2012-02-05 ENCOUNTER — Other Ambulatory Visit: Payer: Self-pay | Admitting: *Deleted

## 2012-02-05 MED ORDER — ENALAPRIL MALEATE 20 MG PO TABS: 20.0000 mg | ORAL_TABLET | Freq: Every day | ORAL | Status: DC

## 2012-02-05 NOTE — Telephone Encounter (Signed)
Refilled enalapril

## 2012-02-11 DIAGNOSIS — I639 Cerebral infarction, unspecified: Secondary | ICD-10-CM

## 2012-02-11 HISTORY — DX: Cerebral infarction, unspecified: I63.9

## 2012-02-20 ENCOUNTER — Ambulatory Visit (INDEPENDENT_AMBULATORY_CARE_PROVIDER_SITE_OTHER): Payer: Medicare Other | Admitting: Cardiovascular Disease

## 2012-02-20 ENCOUNTER — Encounter: Payer: Self-pay | Admitting: Cardiovascular Disease

## 2012-02-20 VITALS — BP 132/70 | HR 54 | Ht 72.0 in | Wt 189.2 lb

## 2012-02-20 DIAGNOSIS — I10 Essential (primary) hypertension: Secondary | ICD-10-CM

## 2012-02-20 DIAGNOSIS — I2510 Atherosclerotic heart disease of native coronary artery without angina pectoris: Secondary | ICD-10-CM

## 2012-02-20 DIAGNOSIS — I25810 Atherosclerosis of coronary artery bypass graft(s) without angina pectoris: Secondary | ICD-10-CM

## 2012-02-20 DIAGNOSIS — E785 Hyperlipidemia, unspecified: Secondary | ICD-10-CM

## 2012-02-20 DIAGNOSIS — E119 Type 2 diabetes mellitus without complications: Secondary | ICD-10-CM

## 2012-02-20 NOTE — Progress Notes (Signed)
Patient ID: Randy Frazier, male    DOB: [birth year], 79 y.o.   MRN: [id number]  HPI Comments: [name] is a very pleasant 79 year old gentleman with a history of coronary artery disease, bypass surgery in 2009 at [facility], poorly controlled diabetes, history of hyperlipidemia and hypertension who presents for routine followup.  He had a difficult time after the loss of his wife several years ago. He is been doing better since then, he is eating more, has had weight gain and has started to go out and socialize. Overall he feels much better. He continues to have trouble sleeping and has been taking melatonin 500 mg a time. He reports recently going line dancing and although he did not know the moves, he had a good time and is willing to go back. He does have mild fatigue at times.  Recent lab work shows total cholesterol 134, LDL 70, HDL 35 labs from May 2013 Hemoglobin A1c 7.2   Cardiac catheterization notes from August 2009 shows a proximal left main lesion of 40%, proximal LAD lesion 60%, mid LAD lesion 95%, distal LAD lesion 70%, proximal circumflex and 60% followed by a 90% lesion with 99% proximal RCA disease, 70% distal RCA disease, 100% stenosis of the PDA vessel. He was transferred to [facility] for bypass surgery previous records are not available to [place] at this time.   Echocardiogram from December 2009 shows mild mitral regurgitation, mild pulmonic regurgitation, trace TR, otherwise normal LV size and systolic function with mild LVH and mild diastolic dysfunction.   EKG shows normal sinus rhythm with rate 53 beats per minute, right bundle branch block, no significant ST-T wave changes    Outpatient Encounter Prescriptions as of 02/20/2012  Medication Sig Dispense Refill  . amLODipine (NORVASC) 10 MG tablet Take 1 tablet (10 mg total) by mouth daily.  30 tablet  3  . aspirin 162 MG EC tablet 162 mg daily.      [name] atorvastatin (LIPITOR) 10 MG tablet Take 1 tablet (10 mg total) by  mouth daily.  30 tablet  3  . enalapril (VASOTEC) 20 MG tablet Take 1 tablet (20 mg total) by mouth daily.  30 tablet  5  . Fluoxetine HCl, PMDD, 20 MG CAPS Take 20 mg by mouth daily.      [name] gabapentin (NEURONTIN) 300 MG capsule Take 300 mg by mouth 3 (three) times daily.       . Melatonin 500 MCG TBDP Take 500 mcg by mouth at bedtime.      . metFORMIN (GLUCOPHAGE) 500 MG tablet Take 500 mg by mouth 2 (two) times daily with a meal.        . metoprolol tartrate (LOPRESSOR) 25 MG tablet Take 25 mg by mouth 2 (two) times daily.        [name] omeprazole (PRILOSEC) 40 MG capsule Take 40 mg by mouth daily.        . Tamsulosin HCl (FLOMAX) 0.4 MG CAPS Take 0.4 mg by mouth daily.        Review of Systems  Constitutional: Negative.   HENT: Negative.   Eyes: Negative.   Respiratory: Negative.   Cardiovascular: Negative.   Gastrointestinal: Negative.   Musculoskeletal: Negative.   Skin: Negative.   Neurological: Negative.   Hematological: Negative.   All other systems reviewed and are negative.   BP 132/70  Pulse 54  Ht 6' (1.829 m)  Wt 189 lb 4 oz (85.843 kg)  BMI 25.67 kg/m2  Physical Exam  Nursing note and vitals reviewed. Constitutional: He is oriented to person, place, and time. He appears well-developed and well-nourished.  HENT:  Head: Normocephalic.  Nose: Nose normal.  Mouth/Throat: Oropharynx is clear and moist.  Eyes: Conjunctivae normal are normal. Pupils are equal, round, and reactive to light.  Neck: Normal range of motion. Neck supple. No JVD present.  Cardiovascular: Normal rate, regular rhythm, S1 normal, S2 normal, normal heart sounds and intact distal pulses.  Exam reveals no gallop and no friction rub.   No murmur heard. Pulmonary/Chest: Effort normal and breath sounds normal. No respiratory distress. He has no wheezes. He has no rales. He exhibits no tenderness.  Abdominal: Soft. Bowel sounds are normal. He exhibits no distension. There is no tenderness.    Musculoskeletal: Normal range of motion. He exhibits no edema and no tenderness.  Lymphadenopathy:    He has no cervical adenopathy.  Neurological: He is alert and oriented to person, place, and time. Coordination normal.  Skin: Skin is warm and dry. No rash noted. No erythema.  Psychiatric: He has a normal mood and affect. His behavior is normal. Judgment and thought content normal.           Assessment and Plan

## 2012-02-20 NOTE — Assessment & Plan Note (Signed)
Currently with no symptoms of angina. No further workup at this time. Continue current medication regimen. 

## 2012-02-20 NOTE — Assessment & Plan Note (Signed)
We have encouraged continued exercise, careful diet management in an effort to lose weight. 

## 2012-02-20 NOTE — Patient Instructions (Addendum)
You are doing well. Consider cutting the metoprolol in 1/2 and take twice a day  Come in for cholesterol check (fasting) next week  Please call us if you have new issues that need to be addressed before your next appt.  Your physician wants you to follow-up in: 6 months.  You will receive a reminder letter in the mail two months in advance. If you don't receive a letter, please call our office to schedule the follow-up appointment.

## 2012-02-20 NOTE — Assessment & Plan Note (Signed)
He will come in next week for routine lipid panel. We have suggested he stay on Lipitor 10 mg daily until results are back for further review.

## 2012-02-20 NOTE — Assessment & Plan Note (Signed)
Blood pressure is well controlled on today's visit. No changes made to the medications. 

## 2012-02-23 ENCOUNTER — Ambulatory Visit (INDEPENDENT_AMBULATORY_CARE_PROVIDER_SITE_OTHER): Payer: Medicare Other

## 2012-02-23 DIAGNOSIS — I2510 Atherosclerotic heart disease of native coronary artery without angina pectoris: Secondary | ICD-10-CM

## 2012-02-23 DIAGNOSIS — E785 Hyperlipidemia, unspecified: Secondary | ICD-10-CM

## 2012-02-24 LAB — HEPATIC FUNCTION PANEL
Albumin: 4.1 g/dL (ref 3.5–4.7)
Total Protein: 6.4 g/dL (ref 6.0–8.5)

## 2012-02-24 LAB — LIPID PANEL
LDL Calculated: 64 mg/dL (ref 0–99)
VLDL Cholesterol Cal: 20 mg/dL (ref 5–40)

## 2012-03-09 ENCOUNTER — Emergency Department: Payer: Self-pay | Admitting: Emergency Medicine

## 2012-03-11 ENCOUNTER — Other Ambulatory Visit: Payer: Self-pay

## 2012-03-11 MED ORDER — ATORVASTATIN CALCIUM 10 MG PO TABS: 10.0000 mg | ORAL_TABLET | Freq: Every day | ORAL | Status: DC

## 2012-03-11 NOTE — Telephone Encounter (Signed)
Refill sent for atorvastatin. 

## 2012-05-12 ENCOUNTER — Other Ambulatory Visit: Payer: Self-pay | Admitting: *Deleted

## 2012-05-12 MED ORDER — AMLODIPINE BESYLATE 10 MG PO TABS: 10.0000 mg | ORAL_TABLET | Freq: Every day | ORAL | Status: DC

## 2012-05-12 NOTE — Telephone Encounter (Signed)
Refilled Amlodipine sent to [facility] pharmacy. 

## 2012-08-04 ENCOUNTER — Other Ambulatory Visit: Payer: Self-pay | Admitting: *Deleted

## 2012-08-04 MED ORDER — ENALAPRIL MALEATE 20 MG PO TABS: 20.0000 mg | ORAL_TABLET | Freq: Every day | ORAL | Status: DC

## 2012-08-04 NOTE — Telephone Encounter (Signed)
Refilled Enalapril sent to [facility] pharmacy.

## 2012-08-12 DIAGNOSIS — R4701 Aphasia: Secondary | ICD-10-CM | POA: Insufficient documentation

## 2012-08-16 ENCOUNTER — Telehealth: Payer: Self-pay | Admitting: *Deleted

## 2012-08-16 NOTE — Telephone Encounter (Signed)
Please call patient..he had a stroke Wed. 08/12/12 [place] and has some questions

## 2012-08-16 NOTE — Telephone Encounter (Signed)
Pt says he was in [place] last week for vacation and had a CVA He was taken to [facility] and was admitted x 3 days Says he had echocardiogram, CT and MRI He does have some aphasia, which he mentions is the only deficit from the CVA He was advised to start speech therapy when he returned home but is unsure where to begin with this He is due for 6 month f/u with Dr. Mariah Milling I advised he come in 7/9 for 6 month f/u and hospital f/u and we can discuss speech therapy at that time. I exp[lained this may be left up to PCP to manage, but he states PCP  is leaving and has only seen pt once Will come in 7/9 and discuss further He will drop off records from hospital today

## 2012-08-18 ENCOUNTER — Ambulatory Visit (INDEPENDENT_AMBULATORY_CARE_PROVIDER_SITE_OTHER): Payer: Medicare Other | Admitting: Cardiovascular Disease

## 2012-08-18 ENCOUNTER — Encounter: Payer: Self-pay | Admitting: Cardiovascular Disease

## 2012-08-18 VITALS — BP 121/60 | HR 57 | Ht 73.0 in | Wt 182.5 lb

## 2012-08-18 DIAGNOSIS — I639 Cerebral infarction, unspecified: Secondary | ICD-10-CM | POA: Insufficient documentation

## 2012-08-18 DIAGNOSIS — I25810 Atherosclerosis of coronary artery bypass graft(s) without angina pectoris: Secondary | ICD-10-CM

## 2012-08-18 DIAGNOSIS — M109 Gout, unspecified: Secondary | ICD-10-CM

## 2012-08-18 DIAGNOSIS — E785 Hyperlipidemia, unspecified: Secondary | ICD-10-CM

## 2012-08-18 DIAGNOSIS — E119 Type 2 diabetes mellitus without complications: Secondary | ICD-10-CM

## 2012-08-18 DIAGNOSIS — I2510 Atherosclerotic heart disease of native coronary artery without angina pectoris: Secondary | ICD-10-CM

## 2012-08-18 DIAGNOSIS — I10 Essential (primary) hypertension: Secondary | ICD-10-CM

## 2012-08-18 MED ORDER — CLOPIDOGREL BISULFATE 75 MG PO TABS: 75.0000 mg | ORAL_TABLET | Freq: Every day | ORAL | Status: DC

## 2012-08-18 MED ORDER — COLCHICINE 0.6 MG PO TABS
0.6000 mg | ORAL_TABLET | Freq: Two times a day (BID) | ORAL | Status: DC | PRN
Start: 2012-08-18 — End: 2016-02-22

## 2012-08-18 NOTE — Assessment & Plan Note (Signed)
He reports having gout attack of his left ankle. It is swollen and tender. He has had similar episodes in the past and reports that colchicine works well. He is requesting a prescription of colchicine and we will send this in for him to the pharmacy. I have asked him to followup with his primary care physician for his gout management.

## 2012-08-18 NOTE — Assessment & Plan Note (Signed)
Etiology is not clear. Mild residual speech deficits. We have ordered a bubble study to rule out interatrial septal defect, PFO. We'll start him on Plavix as well as his aspirin. If he has recurrent stroke, he may need warfarin or other more aggressive anticoagulation.

## 2012-08-18 NOTE — Progress Notes (Signed)
Patient ID: Randy Frazier, male    DOB: 10-03-1927, 79 y.o.   MRN: [id number]  HPI Comments: Randy Frazier is a very pleasant 79 year old gentleman with a history of coronary artery disease, bypass surgery in 2009 at [facility], poorly controlled diabetes, history of hyperlipidemia and hypertension who presents for routine followup.  He reports that he had recent stroke last week[place] difficulty with [place] speech. He was admitted to a hospital there and had full workup including carotid ultrasound, head CT, MRI and echocardiogram. Presumably he was placed on a telemetry monitor during his hospital admission. Cardiac CT and MRI showed left posterior temporal lobe stroke.  Carotid ultrasound showed mild plaquing bilaterally Echocardiogram showed we've she estimated at 60-65%, mildly dilated left atrium, otherwise normal study   lab work there showed hemoglobin A1c 7.4, total cholesterol 138, LDL 70  Cardiac catheterization notes from August 2009 shows a proximal left main lesion of 40%, proximal LAD lesion 60%, mid LAD lesion 95%, distal LAD lesion 70%, proximal circumflex and 60% followed by a 90% lesion with 99% proximal RCA disease, 70% distal RCA disease, 100% stenosis of the PDA vessel. He was transferred to [facility] for bypass surgery previous records are not available to [place] at this time.   Echocardiogram from December 2009 shows mild mitral regurgitation, mild pulmonic regurgitation, trace TR, otherwise normal LV size and systolic function with mild LVH and mild diastolic dysfunction.   EKG shows normal sinus rhythm with rate 57beats per minute, right bundle branch block, no significant ST-T wave changes    Outpatient Encounter Prescriptions as of 08/18/2012  Medication Sig Dispense Refill  . amLODipine (NORVASC) 10 MG tablet Take 1 tablet (10 mg total) by mouth daily.  30 tablet  3  . aspirin 162 MG EC tablet 162 mg daily.      [name]  atorvastatin (LIPITOR) 10 MG tablet Take 1 tablet (10 mg total) by mouth daily.  30 tablet  6  . enalapril (VASOTEC) 20 MG tablet Take 1 tablet (20 mg total) by mouth daily.  30 tablet  5  . Fluoxetine HCl, PMDD, 20 MG CAPS Take 20 mg by mouth daily.      [name] gabapentin (NEURONTIN) 300 MG capsule Take 300 mg by mouth 3 (three) times daily.       . Melatonin 500 MCG TBDP Take 500 mcg by mouth at bedtime.      . metFORMIN (GLUCOPHAGE) 500 MG tablet Take 500 mg by mouth 2 (two) times daily with a meal.        . metoprolol tartrate (LOPRESSOR) 25 MG tablet Take 25 mg by mouth 2 (two) times daily.        [name] omeprazole (PRILOSEC) 40 MG capsule Take 40 mg by mouth daily.        . Tamsulosin HCl (FLOMAX) 0.4 MG CAPS Take 0.4 mg by mouth daily.      . clopidogrel (PLAVIX) 75 MG tablet Take 1 tablet (75 mg total) by mouth daily.  30 tablet  6  . colchicine 0.6 MG tablet Take 1 tablet (0.6 mg total) by mouth 2 (two) times daily as needed.  30 tablet  1   Review of Systems  Constitutional: Negative.   HENT: Negative.   Eyes: Negative.   Respiratory: Negative.   Cardiovascular: Negative.   Gastrointestinal: Negative.   Musculoskeletal: Negative.   Skin: Negative.   Neurological: Negative.  Problems with his speech  Psychiatric/Behavioral: Negative.   All other systems reviewed and are negative.   BP 121/60  Pulse 57  Ht 6\' 1"  (1.854 m)  Wt 182 lb 8 oz (82.781 kg)  BMI 24.08 kg/m2  Physical Exam  Nursing note and vitals reviewed. Constitutional: He is oriented to person, place, and time. He appears well-developed and well-nourished.  HENT:  Head: Normocephalic.  Nose: Nose normal.  Mouth/Throat: Oropharynx is clear and moist.  Eyes: Conjunctivae are normal. Pupils are equal, round, and reactive to light.  Neck: Normal range of motion. Neck supple. No JVD present.  Cardiovascular: Normal rate, regular rhythm, S1 normal, S2 normal, normal heart sounds and intact distal pulses.  Exam  reveals no gallop and no friction rub.   No murmur heard. Pulmonary/Chest: Effort normal and breath sounds normal. No respiratory distress. He has no wheezes. He has no rales. He exhibits no tenderness.  Abdominal: Soft. Bowel sounds are normal. He exhibits no distension. There is no tenderness.  Musculoskeletal: Normal range of motion. He exhibits no edema and no tenderness.  Lymphadenopathy:    He has no cervical adenopathy.  Neurological: He is alert and oriented to person, place, and time. Coordination normal.  Skin: Skin is warm and dry. No rash noted. No erythema.  Psychiatric: He has a normal mood and affect. His behavior is normal. Judgment and thought content normal.      Assessment and Plan

## 2012-08-18 NOTE — Assessment & Plan Note (Signed)
Cholesterol is at goal on the current lipid regimen. No changes to the medications were made.  

## 2012-08-18 NOTE — Assessment & Plan Note (Signed)
Encouraged him to work on his diet and exercise. Goal hemoglobin A1c less than 6.5

## 2012-08-18 NOTE — Patient Instructions (Addendum)
Please take colchicine for gout twice a day as needed Please start plavix once a day with aspirin 81 mg daily If no bruising, ok to increase to aspirin 81 mg x 2 with plavix  We will call you to schedule a "bubble study" to look for a hole in the heart   Please call us if you have new issues that need to be addressed before your next appt.  Your physician wants you to follow-up in: 6 months.  You will receive a reminder letter in the mail two months in advance. If you don't receive a letter, please call our office to schedule the follow-up appointment.

## 2012-08-18 NOTE — Assessment & Plan Note (Signed)
Blood pressure is well controlled on today's visit. No changes made to the medications. 

## 2012-08-18 NOTE — Assessment & Plan Note (Signed)
Currently with no symptoms of angina. No further workup at this time. Continue current medication regimen. 

## 2012-08-19 NOTE — Addendum Note (Signed)
Addended by: Sabino Snipes E on: 08/19/2012 08:24 AM   Modules accepted: Orders

## 2012-08-24 ENCOUNTER — Other Ambulatory Visit: Payer: Self-pay

## 2012-08-24 ENCOUNTER — Other Ambulatory Visit (INDEPENDENT_AMBULATORY_CARE_PROVIDER_SITE_OTHER): Payer: Medicare Other

## 2012-08-24 DIAGNOSIS — I639 Cerebral infarction, unspecified: Secondary | ICD-10-CM

## 2012-08-24 DIAGNOSIS — I10 Essential (primary) hypertension: Secondary | ICD-10-CM

## 2012-08-24 DIAGNOSIS — E785 Hyperlipidemia, unspecified: Secondary | ICD-10-CM

## 2012-08-24 DIAGNOSIS — I6789 Other cerebrovascular disease: Secondary | ICD-10-CM

## 2012-08-24 DIAGNOSIS — M109 Gout, unspecified: Secondary | ICD-10-CM

## 2012-08-24 DIAGNOSIS — I25810 Atherosclerosis of coronary artery bypass graft(s) without angina pectoris: Secondary | ICD-10-CM

## 2012-08-24 DIAGNOSIS — E119 Type 2 diabetes mellitus without complications: Secondary | ICD-10-CM

## 2012-08-24 DIAGNOSIS — I2510 Atherosclerotic heart disease of native coronary artery without angina pectoris: Secondary | ICD-10-CM

## 2012-08-27 ENCOUNTER — Encounter: Payer: Self-pay | Admitting: *Deleted

## 2012-09-13 DIAGNOSIS — M109 Gout, unspecified: Secondary | ICD-10-CM

## 2012-09-13 DIAGNOSIS — I2510 Atherosclerotic heart disease of native coronary artery without angina pectoris: Secondary | ICD-10-CM

## 2012-09-13 DIAGNOSIS — I10 Essential (primary) hypertension: Secondary | ICD-10-CM

## 2012-09-13 DIAGNOSIS — E785 Hyperlipidemia, unspecified: Secondary | ICD-10-CM

## 2012-09-13 DIAGNOSIS — E119 Type 2 diabetes mellitus without complications: Secondary | ICD-10-CM

## 2012-09-13 DIAGNOSIS — I6350 Cerebral infarction due to unspecified occlusion or stenosis of unspecified cerebral artery: Secondary | ICD-10-CM

## 2012-09-13 DIAGNOSIS — I25810 Atherosclerosis of coronary artery bypass graft(s) without angina pectoris: Secondary | ICD-10-CM

## 2012-10-03 ENCOUNTER — Other Ambulatory Visit: Payer: Self-pay | Admitting: Cardiovascular Disease

## 2012-10-22 ENCOUNTER — Other Ambulatory Visit: Payer: Self-pay | Admitting: Cardiovascular Disease

## 2012-10-25 ENCOUNTER — Other Ambulatory Visit: Payer: Self-pay | Admitting: *Deleted

## 2012-10-25 MED ORDER — ATORVASTATIN CALCIUM 10 MG PO TABS: 10.0000 mg | ORAL_TABLET | Freq: Every day | ORAL | Status: DC

## 2012-10-25 NOTE — Telephone Encounter (Signed)
Refilled Atorvastatin sent to [facility] pharmacy.

## 2013-02-21 ENCOUNTER — Ambulatory Visit (INDEPENDENT_AMBULATORY_CARE_PROVIDER_SITE_OTHER): Payer: Medicare Other | Admitting: Cardiovascular Disease

## 2013-02-21 ENCOUNTER — Encounter: Payer: Self-pay | Admitting: Cardiovascular Disease

## 2013-02-21 VITALS — BP 153/73 | HR 54 | Ht 72.0 in | Wt 192.2 lb

## 2013-02-21 DIAGNOSIS — I639 Cerebral infarction, unspecified: Secondary | ICD-10-CM

## 2013-02-21 DIAGNOSIS — E119 Type 2 diabetes mellitus without complications: Secondary | ICD-10-CM

## 2013-02-21 DIAGNOSIS — I2510 Atherosclerotic heart disease of native coronary artery without angina pectoris: Secondary | ICD-10-CM

## 2013-02-21 DIAGNOSIS — I6350 Cerebral infarction due to unspecified occlusion or stenosis of unspecified cerebral artery: Secondary | ICD-10-CM

## 2013-02-21 DIAGNOSIS — E785 Hyperlipidemia, unspecified: Secondary | ICD-10-CM

## 2013-02-21 DIAGNOSIS — I25810 Atherosclerosis of coronary artery bypass graft(s) without angina pectoris: Secondary | ICD-10-CM

## 2013-02-21 DIAGNOSIS — I10 Essential (primary) hypertension: Secondary | ICD-10-CM

## 2013-02-21 NOTE — Progress Notes (Signed)
Patient ID: Randy Frazier, male    DOB: [birth year], 80 y.o.   MRN: [id number]  HPI Comments: Randy Frazier is a very pleasant 80 year old gentleman with a history of coronary artery disease, bypass surgery in 2009 at [facility], poorly controlled diabetes, history of hyperlipidemia and hypertension who presents for routine followup. He has a history of stroke   In followup today, he reports that he continues to have word finding difficulty. 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 Otherwise she feels well with no complaints. Still fighting with depression after the loss of his wife. He's not doing regular exercise. In fact reports that he sits on the couch most of the day but is trying to be more social.   He was previously in the [place] when he had acute onset of difficulty with his speech. He was admitted to a hospital there and had full workup including carotid ultrasound, head CT, MRI and echocardiogram. Presumably he was placed on a telemetry monitor during his hospital admission. Cardiac CT and MRI showed left posterior temporal lobe stroke.  Carotid ultrasound showed mild plaquing bilaterally Echocardiogram ejection fraction 60-65%, mildly dilated left atrium, otherwise normal study  hemoglobin A1c 7.9, Previous  total cholesterol 138, LDL 70  Cardiac catheterization notes from August 2009 shows a proximal left main lesion of 40%, proximal LAD lesion 60%, mid LAD lesion 95%, distal LAD lesion 70%, proximal circumflex and 60% followed by a 90% lesion with 99% proximal RCA disease, 70% distal RCA disease, 100% stenosis of the PDA vessel. He was transferred to [facility] for bypass surgery previous records are not available to [place] at this time.   Echocardiogram from December 2009 shows mild mitral regurgitation, mild pulmonic regurgitation, trace TR, otherwise normal LV size and systolic function with mild LVH and mild diastolic  dysfunction.   EKG shows normal sinus rhythm with rate 54 beats per minute, right bundle branch block, no significant ST-T wave changes   Outpatient Encounter Prescriptions 78 as of 02/21/2013  Medication Sig  . amLODipine (NORVASC) 10 MG tablet TAKE 1 TABLET BY MOUTH EVERY DAY  . aspirin 162 MG EC tablet 162 mg daily.  [name] atorvastatin (LIPITOR) 10 MG tablet Take 1 tablet (10 mg total) by mouth daily.  . clopidogrel (PLAVIX) 75 MG tablet Take 1 tablet (75 mg total) by mouth daily.  . colchicine 0.6 MG tablet Take 1 tablet (0.6 mg total) by mouth 2 (two) times daily as needed.  . Fluoxetine HCl, PMDD, 20 MG CAPS Take 20 mg by mouth daily.  [name] gabapentin (NEURONTIN) 300 MG capsule Take 300 mg by mouth 3 (three) times daily.   [name] lisinopril (PRINIVIL,ZESTRIL) 20 MG tablet Take 20 mg by mouth daily.   . Melatonin 500 MCG TBDP Take 500 mcg by mouth at bedtime.  . metFORMIN (GLUCOPHAGE) 500 MG tablet Take 500 mg by mouth 2 (two) times daily with a meal.    . metoprolol tartrate (LOPRESSOR) 25 MG tablet Take 25 mg by mouth 2 (two) times daily.    [name] omeprazole (PRILOSEC) 40 MG capsule Take 40 mg by mouth daily.    . Tamsulosin HCl (FLOMAX) 0.4 MG CAPS Take 0.4 mg by mouth daily.  . [DISCONTINUED] enalapril (VASOTEC) 20 MG tablet Take 1 tablet (20 mg total) by mouth daily.    Review of Systems  Constitutional: Negative.   HENT: Negative.   Eyes: Negative.   Respiratory: Negative.  Cardiovascular: Negative.   Gastrointestinal: Negative.   Musculoskeletal: Negative.   Skin: Negative.   Neurological: Negative.        Problems with his speech  Psychiatric/Behavioral: Positive for dysphoric mood.  All other systems reviewed and are negative.   BP 153/73  Pulse 54  Ht 6' (1.829 m)  Wt 192 lb 4 oz (87.204 kg)  BMI 26.07 kg/m2  Physical Exam  Nursing note and vitals reviewed. Constitutional: He is oriented to person, place, and time. He appears well-developed and well-nourished.  HENT:   Head: Normocephalic.  Nose: Nose normal.  Mouth/Throat: Oropharynx is clear and moist.  Eyes: Conjunctivae are normal. Pupils are equal, round, and reactive to light.  Neck: Normal range of motion. Neck supple. No JVD present.  Cardiovascular: Normal rate, regular rhythm, S1 normal, S2 normal, normal heart sounds and intact distal pulses.  Exam reveals no gallop and no friction rub.   No murmur heard. Pulmonary/Chest: Effort normal and breath sounds normal. No respiratory distress. He has no wheezes. He has no rales. He exhibits no tenderness.  Abdominal: Soft. Bowel sounds are normal. He exhibits no distension. There is no tenderness.  Musculoskeletal: Normal range of motion. He exhibits no edema and no tenderness.  Lymphadenopathy:    He has no cervical adenopathy.  Neurological: He is alert and oriented to person, place, and time. Coordination normal.  Skin: Skin is warm and dry. No rash noted. No erythema.  Psychiatric: He has a normal mood and affect. His behavior is normal. Judgment and thought content normal.      Assessment and Plan

## 2013-02-21 NOTE — Patient Instructions (Addendum)
You are doing well. No medication changes were made.  If your blood pressure runs high, Please call the office  If your heart rate runs slow (low 50s), call the office  We would cut the metoprolol in 1/2 twice a day  Please call us if you have new issues that need to be addressed before your next appt.  Your physician wants you to follow-up in: 6 months.  You will receive a reminder letter in the mail two months in advance. If you don't receive a letter, please call our office to schedule the follow-up appointment.

## 2013-02-21 NOTE — Assessment & Plan Note (Signed)
Cholesterol is at goal on the current lipid regimen. No changes to the medications were made.  

## 2013-02-21 NOTE — Assessment & Plan Note (Addendum)
has recovered for the most part but still with mild word finding difficulty and difficulty completing his sentences

## 2013-02-21 NOTE — Assessment & Plan Note (Signed)
Blood pressure is well controlled on today's visit. No changes made to the medications. 

## 2013-02-21 NOTE — Assessment & Plan Note (Signed)
Currently with no symptoms of angina. No further workup at this time. Continue current medication regimen. 

## 2013-03-15 ENCOUNTER — Other Ambulatory Visit: Payer: Self-pay | Admitting: *Deleted

## 2013-03-15 ENCOUNTER — Other Ambulatory Visit: Payer: Self-pay | Admitting: Cardiovascular Disease

## 2013-03-15 MED ORDER — CLOPIDOGREL BISULFATE 75 MG PO TABS: 75.0000 mg | ORAL_TABLET | Freq: Every day | ORAL | Status: DC

## 2013-03-15 NOTE — Telephone Encounter (Signed)
Requested Prescriptions   Signed Prescriptions Disp Refills  . clopidogrel (PLAVIX) 75 MG tablet 30 tablet 6    Sig: Take 1 tablet (75 mg total) by mouth daily.    Authorizing Provider: GOLLAN, TIMOTHY J    Ordering User: LOPEZ, MARINA C    

## 2013-03-30 ENCOUNTER — Emergency Department: Payer: Self-pay | Admitting: Emergency Medicine

## 2013-03-30 LAB — URINALYSIS, COMPLETE
BACTERIA: NONE SEEN
Bilirubin,UR: NEGATIVE
Glucose,UR: 150 mg/dL (ref 0–75)
Ketone: NEGATIVE
Leukocyte Esterase: NEGATIVE
NITRITE: NEGATIVE
Ph: 5 (ref 4.5–8.0)
Protein: NEGATIVE
SPECIFIC GRAVITY: 1.023 (ref 1.003–1.030)
Squamous Epithelial: <1
WBC UR: 1 /HPF (ref 0–5)

## 2013-03-30 LAB — COMPREHENSIVE METABOLIC PANEL
ANION GAP: 7 (ref 7–16)
AST: 21 U/L (ref 15–37)
Albumin: 3.7 g/dL (ref 3.4–5.0)
Alkaline Phosphatase: 75 U/L
BUN: 20 mg/dL — ABNORMAL HIGH (ref 7–18)
Bilirubin,Total: 0.5 mg/dL (ref 0.2–1.0)
CHLORIDE: 103 mmol/L (ref 98–107)
CO2: 25 mmol/L (ref 21–32)
CREATININE: 1.54 mg/dL — AB (ref 0.60–1.30)
Calcium, Total: 8.5 mg/dL (ref 8.5–10.1)
EGFR (African American): 48 — ABNORMAL LOW
EGFR (Non-African Amer.): 41 — ABNORMAL LOW
Glucose: 219 mg/dL — ABNORMAL HIGH (ref 65–99)
Osmolality: 279 (ref 275–301)
Potassium: 4.3 mmol/L (ref 3.5–5.1)
SGPT (ALT): 35 U/L (ref 12–78)
Sodium: 135 mmol/L — ABNORMAL LOW (ref 136–145)
Total Protein: 7.7 g/dL (ref 6.4–8.2)

## 2013-03-30 LAB — CBC WITH DIFFERENTIAL/PLATELET
Basophil #: 0 10*3/uL (ref 0.0–0.1)
Basophil %: 0.3 %
Eosinophil #: 0 10*3/uL (ref 0.0–0.7)
Eosinophil %: 0.5 %
HCT: 43.2 % (ref 40.0–52.0)
HGB: 14.6 g/dL (ref 13.0–18.0)
Lymphocyte #: 0.3 10*3/uL — ABNORMAL LOW (ref 1.0–3.6)
Lymphocyte %: 3.9 %
MCH: 31.5 pg (ref 26.0–34.0)
MCHC: 33.7 g/dL (ref 32.0–36.0)
MCV: 93 fL (ref 80–100)
Monocyte #: 0.3 x10 3/mm (ref 0.2–1.0)
Monocyte %: 4 %
Neutrophil #: 7.9 10*3/uL — ABNORMAL HIGH (ref 1.4–6.5)
Neutrophil %: 91.3 %
Platelet: 190 10*3/uL (ref 150–440)
RBC: 4.63 10*6/uL (ref 4.40–5.90)
RDW: 15.6 % — ABNORMAL HIGH (ref 11.5–14.5)
WBC: 8.7 10*3/uL (ref 3.8–10.6)

## 2013-03-30 LAB — LIPASE, BLOOD: Lipase: 119 U/L (ref 73–393)

## 2013-03-30 LAB — TROPONIN I: Troponin-I: <0.02 ng/mL

## 2013-06-04 ENCOUNTER — Other Ambulatory Visit: Payer: Self-pay | Admitting: Cardiovascular Disease

## 2013-10-13 ENCOUNTER — Other Ambulatory Visit: Payer: Self-pay | Admitting: Cardiovascular Disease

## 2013-10-26 ENCOUNTER — Encounter: Payer: Self-pay | Admitting: Cardiovascular Disease

## 2013-10-26 ENCOUNTER — Ambulatory Visit (INDEPENDENT_AMBULATORY_CARE_PROVIDER_SITE_OTHER): Payer: Medicare Other | Admitting: Cardiovascular Disease

## 2013-10-26 VITALS — BP 130/80 | HR 55 | Ht 72.0 in | Wt 186.3 lb

## 2013-10-26 DIAGNOSIS — F32A Depression, unspecified: Secondary | ICD-10-CM

## 2013-10-26 DIAGNOSIS — E1159 Type 2 diabetes mellitus with other circulatory complications: Secondary | ICD-10-CM

## 2013-10-26 DIAGNOSIS — I25810 Atherosclerosis of coronary artery bypass graft(s) without angina pectoris: Secondary | ICD-10-CM

## 2013-10-26 DIAGNOSIS — I2510 Atherosclerotic heart disease of native coronary artery without angina pectoris: Secondary | ICD-10-CM

## 2013-10-26 DIAGNOSIS — E785 Hyperlipidemia, unspecified: Secondary | ICD-10-CM

## 2013-10-26 DIAGNOSIS — F329 Major depressive disorder, single episode, unspecified: Secondary | ICD-10-CM

## 2013-10-26 DIAGNOSIS — I10 Essential (primary) hypertension: Secondary | ICD-10-CM

## 2013-10-26 DIAGNOSIS — F3289 Other specified depressive episodes: Secondary | ICD-10-CM

## 2013-10-26 NOTE — Assessment & Plan Note (Signed)
Blood pressure is well controlled on today's visit. No changes made to the medications. 

## 2013-10-26 NOTE — Assessment & Plan Note (Signed)
Previously had significant adjustment disorder. Has done well on fluoxetine. He has indicated he would like to potentially wean off this medication. Suggested he talk with primary care

## 2013-10-26 NOTE — Assessment & Plan Note (Signed)
Hemoglobin A1c continues to be elevated. Encouraged him to work on his diet, several pound weight loss

## 2013-10-26 NOTE — Patient Instructions (Signed)
You are doing well. No medication changes were made.  Please call us if you have new issues that need to be addressed before your next appt.  Your physician wants you to follow-up in: 6 months.  You will receive a reminder letter in the mail two months in advance. If you don't receive a letter, please call our office to schedule the follow-up appointment.   

## 2013-10-26 NOTE — Assessment & Plan Note (Signed)
Currently with no symptoms of angina. No further workup at this time. Continue current medication regimen. 

## 2013-10-26 NOTE — Progress Notes (Signed)
Patient ID: Randy Frazier, male    DOB: 11/04/1927, 80 y.o.   MRN: [id number]  HPI Comments: Randy Frazier is a very pleasant 80 year old gentleman with a history of coronary artery disease, bypass surgery in 2009 at [facility], poorly controlled diabetes, history of hyperlipidemia and hypertension who presents for routine followup. He has a history of stroke  Following his stroke he had word finding difficulty. He had depression after the loss of his wife. Better on fluoxetine for now he would like to potentially, office medication Reports his depression is better, overall is doing well. Riding a motorcycle periodically.  He denies any significant chest pain or shortness of breath with exertion. Seen in the hospital emergency room 03/30/2013 port chest pain. Workup was essentially normal, negative cardiac enzymes, creatinine 1.54 with BUN of 20, normal LFTs. EKG was unchanged with right bundle branch block  Hemoglobin A1c 7.3, total cholesterol 127, HDL 37, LDL 60  [place] speech. He was admitted to a hospital there and had full workup including carotid ultrasound, head CT, MRI and echocardiogram. Presumably he was placed on a telemetry monitor during his hospital admission. Cardiac CT and MRI showed left posterior temporal lobe stroke.  Carotid ultrasound showed mild plaquing bilaterally Echocardiogram ejection fraction 60-65%, mildly dilated left atrium, otherwise normal study  Cardiac catheterization notes from August 2009 shows a proximal left main lesion of 40%, proximal LAD lesion 60%, mid LAD lesion 95%, distal LAD lesion 70%, proximal circumflex and 60% followed by a 90% lesion with 99% proximal RCA disease, 70% distal RCA disease, 100% stenosis of the PDA vessel. He was transferred to [facility] for bypass surgery previous records are not available to [place] at this time.   Echocardiogram from December 2009 shows mild  mitral regurgitation, mild pulmonic regurgitation, trace TR, otherwise normal LV size and systolic function with mild LVH and mild diastolic dysfunction.   EKG shows normal sinus rhythm with rate 54 beats per minute, right bundle branch block, no significant ST-T wave changes   Outpatient Encounter Prescriptions as of 10/26/2013  Medication Sig  . amLODipine (NORVASC) 10 MG tablet TAKE 1 TABLET BY MOUTH EVERY DAY  . aspirin 162 MG EC tablet 162 mg daily.  [name] atorvastatin (LIPITOR) 10 MG tablet TAKE 1 TABLET BY MOUTH EVERY DAY  . clopidogrel (PLAVIX) 75 MG tablet TAKE 1 TABLET BY MOUTH EVERY DAY  . colchicine 0.6 MG tablet Take 1 tablet (0.6 mg total) by mouth 2 (two) times daily as needed.  . Fluoxetine HCl, PMDD, 20 MG CAPS Take 20 mg by mouth daily.  [name] gabapentin (NEURONTIN) 300 MG capsule Take 300 mg by mouth 3 (three) times daily.   [name] lisinopril (PRINIVIL,ZESTRIL) 20 MG tablet Take 20 mg by mouth daily.   . Melatonin 500 MCG TBDP Take 500 mcg by mouth at bedtime.  . metFORMIN (GLUCOPHAGE) 500 MG tablet Take 500 mg by mouth 2 (two) times daily with a meal.    . metoprolol tartrate (LOPRESSOR) 25 MG tablet Take 25 mg by mouth 2 (two) times daily.    [name] omeprazole (PRILOSEC) 40 MG capsule Take 40 mg by mouth daily.    . Tamsulosin HCl (FLOMAX) 0.4 MG CAPS Take 0.4 mg by mouth daily.    Review of Systems  Constitutional: Negative.   HENT: Negative.   Eyes: Negative.   Respiratory: Negative.   Cardiovascular: Negative.   Gastrointestinal: Negative.   Endocrine: Negative.  Musculoskeletal: Negative.   Skin: Negative.   Allergic/Immunologic: Negative.   Neurological: Negative.        Problems with his speech  Hematological: Negative.   Psychiatric/Behavioral: Positive for dysphoric mood.  All other systems reviewed and are negative.  BP 130/80  Pulse 55  Ht 6' (1.829 m)  Wt 186 lb 5 oz (84.511 kg)  BMI 25.26 kg/m2  Physical Exam  Nursing note and vitals  reviewed. Constitutional: He is oriented to person, place, and time. He appears well-developed and well-nourished.  HENT:  Head: Normocephalic.  Nose: Nose normal.  Mouth/Throat: Oropharynx is clear and moist.  Eyes: Conjunctivae are normal. Pupils are equal, round, and reactive to light.  Neck: Normal range of motion. Neck supple. No JVD present.  Cardiovascular: Normal rate, regular rhythm, S1 normal, S2 normal, normal heart sounds and intact distal pulses.  Exam reveals no gallop and no friction rub.   No murmur heard. Pulmonary/Chest: Effort normal and breath sounds normal. No respiratory distress. He has no wheezes. He has no rales. He exhibits no tenderness.  Abdominal: Soft. Bowel sounds are normal. He exhibits no distension. There is no tenderness.  Musculoskeletal: Normal range of motion. He exhibits no edema and no tenderness.  Lymphadenopathy:    He has no cervical adenopathy.  Neurological: He is alert and oriented to person, place, and time. Coordination normal.  Skin: Skin is warm and dry. No rash noted. No erythema.  Psychiatric: He has a normal mood and affect. His behavior is normal. Judgment and thought content normal.      Assessment and Plan

## 2013-10-26 NOTE — Assessment & Plan Note (Signed)
Cholesterol is at goal on the current lipid regimen. No changes to the medications were made.  

## 2013-11-19 ENCOUNTER — Other Ambulatory Visit: Payer: Self-pay | Admitting: Cardiovascular Disease

## 2014-03-16 ENCOUNTER — Telehealth: Payer: Self-pay | Admitting: *Deleted

## 2014-03-16 NOTE — Telephone Encounter (Signed)
Left message for pt to call back  °

## 2014-03-16 NOTE — Telephone Encounter (Signed)
BP ran how high? Was he experiencing any symptoms? Is his BP still high? How has his BP been running lately? I cannot fax a letter clearing him based on the info provided in the phone note.

## 2014-03-16 NOTE — Telephone Encounter (Signed)
1.What dental office are you calling from? [facility]   1. What is your office phone and fax number? [phone] and fax: [phone]  2. What type of procedure is the patient having performed? [facility] crown lengthen   3. What date is procedure scheduled? could'nt start it today, we suppose be done today   4. What is your question (ex. Antibiotics prior to procedure, holding medication-we need to know how long dentist wants pt to hold med)? No They just need a letter saying he is okay to do this. He came in today to do procedure but his bp ran a little high.  Please fax a letter of clearance.

## 2014-03-17 NOTE — Telephone Encounter (Signed)
Not much I can offer here if his BP is running normal at home and their's is elevated unless he takes his BP cuff in to compare so we truly know what is going on. Alternatively, he can bring his BP cuff here to verify that his is calibrated. If we can verify that and if we can verify that he has normal readings at home then we may be able to treat his blood pressure for a short period to get him through his dental procedure. However, he will have to help [place] out be cooperating to get this done.

## 2014-03-17 NOTE — Telephone Encounter (Signed)
Spoke w/ pt.  He reports that during his dental visit, his BP was 190/90, "couldn't get it to come down past 180/something".  Reports that he feels fine, never felt bad and his BP runs WNL at home. He feels that they are getting an inaccurate reading.  Advised pt to bring his BP monitor to visit to compare, but he states that he does not want to sched another appt w/ them unless he can proceed w/ dental procedure.  Please advise.  Thank you.

## 2014-03-17 NOTE — Telephone Encounter (Signed)
Spoke w/ pt.  Advised him of Ryan's recommendation.  He states "I just need a note so I can get my teeth done". Again reiterated Ryan's suggestion. He verbalizes understanding and will call back if he would like [place] to check his cuff.

## 2014-03-21 ENCOUNTER — Other Ambulatory Visit: Payer: Self-pay | Admitting: Cardiovascular Disease

## 2014-06-01 ENCOUNTER — Telehealth: Payer: Self-pay | Admitting: *Deleted

## 2014-06-01 NOTE — Telephone Encounter (Signed)
Patient needs cardiac clearance for dental procedure. It will be scheduled as soon as he gets his cardiac clearance. Please fax to [facility] at [address] Caryl Asp, DDs phone # [phone] fax # [phone]. Please call patient when this has been completed. Thanks!

## 2014-06-01 NOTE — Telephone Encounter (Signed)
Letter faxed.

## 2014-06-01 NOTE — Telephone Encounter (Signed)
He is having a crown lengthing

## 2014-06-01 NOTE — Telephone Encounter (Signed)
Letter printed and signed.  

## 2014-06-01 NOTE — Telephone Encounter (Signed)
Left message for pt to call back and let us know what type of procedure he is having done.

## 2014-06-08 ENCOUNTER — Telehealth: Payer: Self-pay

## 2014-06-08 NOTE — Telephone Encounter (Signed)
Patient called to check status of clearance form fax.  Patient is aware this was faxed on 04-21 per note from [facility].

## 2014-09-14 ENCOUNTER — Other Ambulatory Visit: Payer: Self-pay | Admitting: Cardiovascular Disease

## 2014-09-14 DIAGNOSIS — I25810 Atherosclerosis of coronary artery bypass graft(s) without angina pectoris: Secondary | ICD-10-CM

## 2014-09-22 ENCOUNTER — Ambulatory Visit (INDEPENDENT_AMBULATORY_CARE_PROVIDER_SITE_OTHER): Payer: Medicare Other

## 2014-09-22 ENCOUNTER — Other Ambulatory Visit: Payer: Self-pay

## 2014-09-22 DIAGNOSIS — I25810 Atherosclerosis of coronary artery bypass graft(s) without angina pectoris: Secondary | ICD-10-CM

## 2014-09-26 ENCOUNTER — Telehealth: Payer: Self-pay | Admitting: Cardiovascular Disease

## 2014-09-26 NOTE — Telephone Encounter (Signed)
Patient was denied upcoming dental procedure because we did not provide card clearance letter that [employer] [facility] [employer] needed to proceed.  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  Please see note from [facility] under Letters.  Patient  Made a comment that he might die then said oh no he was kidding he was fine.  Patient was instructed to go to the ER if needed.  Patient also aware he has an upcoming appt with Dr. Rockey Situ on 08/23.   Patient says he may have to cancel dental procedure and asked if this is what he should do.     Please let patient know what to do about Bp and dental appt.

## 2014-09-26 NOTE — Telephone Encounter (Signed)
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 Rockey Situ. States he will be here 8/23 at 2:40pm Pt had no further questions.

## 2014-10-03 ENCOUNTER — Encounter: Payer: Self-pay | Admitting: Cardiovascular Disease

## 2014-10-03 ENCOUNTER — Ambulatory Visit (INDEPENDENT_AMBULATORY_CARE_PROVIDER_SITE_OTHER): Payer: Medicare Other | Admitting: Cardiovascular Disease

## 2014-10-03 VITALS — BP 106/60 | HR 61 | Ht 72.0 in | Wt 184.8 lb

## 2014-10-03 DIAGNOSIS — M549 Dorsalgia, unspecified: Secondary | ICD-10-CM

## 2014-10-03 DIAGNOSIS — E1159 Type 2 diabetes mellitus with other circulatory complications: Secondary | ICD-10-CM

## 2014-10-03 DIAGNOSIS — E785 Hyperlipidemia, unspecified: Secondary | ICD-10-CM

## 2014-10-03 DIAGNOSIS — I25810 Atherosclerosis of coronary artery bypass graft(s) without angina pectoris: Secondary | ICD-10-CM

## 2014-10-03 DIAGNOSIS — I10 Essential (primary) hypertension: Secondary | ICD-10-CM

## 2014-10-03 MED ORDER — ALPRAZOLAM 0.25 MG PO TABS: 0.2500 mg | ORAL_TABLET | Freq: Every day | ORAL | Status: DC | PRN

## 2014-10-03 NOTE — Assessment & Plan Note (Signed)
Currently with no symptoms of angina. No further workup at this time. Continue current medication regimen. 

## 2014-10-03 NOTE — Assessment & Plan Note (Signed)
Blood pressure well controlled. Recommended he stay on his current medications Prior to his dental procedure, recommended he take Xanax Likely has situational hypertension

## 2014-10-03 NOTE — Assessment & Plan Note (Signed)
We have encouraged continued exercise, careful diet management   

## 2014-10-03 NOTE — Assessment & Plan Note (Signed)
Cholesterol is at goal on the current lipid regimen. No changes to the medications were made.  

## 2014-10-03 NOTE — Patient Instructions (Signed)
You are doing well. No medication changes were made.  Take xanax the morning of the dental procedure Take additional pill before procedure if needed for high blood pressure  Please call us if you have new issues that need to be addressed before your next appt.  Your physician wants you to follow-up in: 6 months.  You will receive a reminder letter in the mail two months in advance. If you don't receive a letter, please call our office to schedule the follow-up appointment.

## 2014-10-03 NOTE — Progress Notes (Signed)
Patient ID: Randy Frazier, male    DOB: November 20, 1927, 81 y.o.   MRN: [id number]  HPI Comments: Randy Frazier is a very pleasant 81 year old gentleman with a history of coronary artery disease, bypass surgery in 2009 at [facility], poorly controlled diabetes, history of hyperlipidemia and hypertension who presents for routine followup of his coronary artery disease He has a history of stroke  Reports having well-controlled blood pressure at home Several trips to the dentist at [facility] has had high blood pressure They have refused treatment He reports he is been taking his medications consistently. He is requesting a note to give to them so they may proceed with care and do a final procedure on his teeth.  Otherwise reports he is doing well, denies any anginal symptoms, rice his motorcycle on a frequent basis  EKG on today's visit shows normal sinus rhythm with rate 61 bpm, right bundle branch block  Other past medical history Prior stroke stroke he had word finding difficulty. He had depression after the loss of his wife.   Seen in the hospital emergency room 03/30/2013 for chest pain. Workup was essentially normal, negative cardiac enzymes, creatinine 1.54 with BUN of 20, normal LFTs. EKG was unchanged with right bundle branch block  Hemoglobin A1c 7.3, total cholesterol 127, HDL 37, LDL 60  [place] of [place] with [place] speech. He was admitted to a hospital there and had full workup including carotid ultrasound, head CT, MRI and echocardiogram. Presumably he was placed on a telemetry monitor during his hospital admission. Cardiac CT and MRI showed left posterior temporal lobe stroke.  Carotid ultrasound showed mild plaquing bilaterally Echocardiogram ejection fraction 60-65%, mildly dilated left atrium, otherwise normal study  Cardiac catheterization notes from August 2009 shows a proximal left main lesion of 40%, proximal LAD lesion 60%, mid LAD  lesion 95%, distal LAD lesion 70%, proximal circumflex and 60% followed by a 90% lesion with 99% proximal RCA disease, 70% distal RCA disease, 100% stenosis of the PDA vessel. He was transferred to [facility] for bypass surgery previous records are not available to [place] at this time.   Echocardiogram from December 2009 shows mild mitral regurgitation, mild pulmonic regurgitation, trace TR, otherwise normal LV size and systolic function with mild LVH and mild diastolic dysfunction.   Allergies  Allergen Reactions  . Ibuprofen   . Penicillins     Current Outpatient Prescriptions on File Prior to Visit  Medication Sig Dispense Refill  . amLODipine (NORVASC) 10 MG tablet TAKE 1 TABLET BY MOUTH EVERY DAY 30 tablet 6  . aspirin 162 MG EC tablet 162 mg daily.    [name] atorvastatin (LIPITOR) 10 MG tablet TAKE 1 TABLET BY MOUTH EVERY DAY 30 tablet 0  . clopidogrel (PLAVIX) 75 MG tablet TAKE 1 TABLET BY MOUTH EVERY DAY 30 tablet 0  . colchicine 0.6 MG tablet Take 1 tablet (0.6 mg total) by mouth 2 (two) times daily as needed. 30 tablet 1  . Fluoxetine HCl, PMDD, 20 MG CAPS Take 20 mg by mouth daily.    [name] gabapentin (NEURONTIN) 300 MG capsule Take 300 mg by mouth 3 (three) times daily.     [name] lisinopril (PRINIVIL,ZESTRIL) 20 MG tablet Take 20 mg by mouth daily.     . Melatonin 500 MCG TBDP Take 500 mcg by mouth at bedtime.    . metFORMIN (GLUCOPHAGE) 500 MG tablet Take 500 mg by mouth 2 (two) times daily with a meal.      .  metoprolol tartrate (LOPRESSOR) 25 MG tablet Take 25 mg by mouth 2 (two) times daily.      [name] omeprazole (PRILOSEC) 40 MG capsule Take 40 mg by mouth daily.      . Tamsulosin HCl (FLOMAX) 0.4 MG CAPS Take 0.4 mg by mouth daily.     No current facility-administered medications on file prior to visit.    Past Medical History  Diagnosis Date  . Hypertension   . Hyperlipidemia   . Arrhythmia     A-Fib  . Coronary artery disease 2009    CABG X 3  @ [facility]  . GERD (gastroesophageal  reflux disease)   . Enlarged prostate   . Renal disorder   . CVA (cerebral infarction) 2014  . Diabetes mellitus without complication   . BPH (benign prostatic hyperplasia)     Past Surgical History  Procedure Laterality Date  . Coronary artery bypass graft  2009    CABG X 3 @ [facility]  . Cataract surgery Bilateral     Social History  reports that he has never smoked. He has never used smokeless tobacco. He reports that he does not drink alcohol or use illicit drugs.  Family History Family history is unknown by patient.        Review of Systems  Constitutional: Negative.   Respiratory: Negative.   Cardiovascular: Negative.   Gastrointestinal: Negative.   Musculoskeletal: Negative.   Skin: Negative.   Neurological: Negative.        Problems with his speech  Hematological: Negative.   Psychiatric/Behavioral: Positive for dysphoric mood.  All other systems reviewed and are negative.  BP 106/60 mmHg  Pulse 61  Ht 6' (1.829 m)  Wt 184 lb 12 oz (83.802 kg)  BMI 25.05 kg/m2  Physical Exam  Constitutional: He is oriented to person, place, and time. He appears well-developed and well-nourished.  HENT:  Head: Normocephalic.  Nose: Nose normal.  Mouth/Throat: Oropharynx is clear and moist.  Eyes: Conjunctivae are normal. Pupils are equal, round, and reactive to light.  Neck: Normal range of motion. Neck supple. No JVD present.  Cardiovascular: Normal rate, regular rhythm, S1 normal, S2 normal, normal heart sounds and intact distal pulses.  Exam reveals no gallop and no friction rub.   No murmur heard. Pulmonary/Chest: Effort normal and breath sounds normal. No respiratory distress. He has no wheezes. He has no rales. He exhibits no tenderness.  Abdominal: Soft. Bowel sounds are normal. He exhibits no distension. There is no tenderness.  Musculoskeletal: Normal range of motion. He exhibits no edema or tenderness.  Lymphadenopathy:    He has no cervical adenopathy.   Neurological: He is alert and oriented to person, place, and time. Coordination normal.  Skin: Skin is warm and dry. No rash noted. No erythema.  Psychiatric: He has a normal mood and affect. His behavior is normal. Judgment and thought content normal.      Assessment and Plan   Nursing note and vitals reviewed.

## 2014-10-19 ENCOUNTER — Other Ambulatory Visit: Payer: Self-pay | Admitting: Cardiovascular Disease

## 2015-01-25 DIAGNOSIS — L84 Corns and callosities: Secondary | ICD-10-CM | POA: Insufficient documentation

## 2015-02-13 DIAGNOSIS — M109 Gout, unspecified: Secondary | ICD-10-CM | POA: Insufficient documentation

## 2015-03-30 ENCOUNTER — Telehealth: Payer: Self-pay | Admitting: Cardiovascular Disease

## 2015-03-30 NOTE — Telephone Encounter (Signed)
Patient wants dr. Rockey Situ to know he was recently treated at [facility] for DM and is now on insulin.

## 2015-05-14 ENCOUNTER — Encounter: Payer: Self-pay | Admitting: *Deleted

## 2015-05-14 ENCOUNTER — Ambulatory Visit: Payer: Medicare Other | Admitting: Cardiovascular Disease

## 2015-05-23 ENCOUNTER — Encounter (INDEPENDENT_AMBULATORY_CARE_PROVIDER_SITE_OTHER): Payer: Self-pay

## 2015-05-23 ENCOUNTER — Encounter: Payer: Self-pay | Admitting: Cardiovascular Disease

## 2015-05-23 ENCOUNTER — Ambulatory Visit (INDEPENDENT_AMBULATORY_CARE_PROVIDER_SITE_OTHER): Payer: Medicare Other | Admitting: Cardiovascular Disease

## 2015-05-23 VITALS — BP 118/68 | HR 68 | Ht 72.0 in | Wt 176.0 lb

## 2015-05-23 DIAGNOSIS — F329 Major depressive disorder, single episode, unspecified: Secondary | ICD-10-CM

## 2015-05-23 DIAGNOSIS — I10 Essential (primary) hypertension: Secondary | ICD-10-CM

## 2015-05-23 DIAGNOSIS — I2510 Atherosclerotic heart disease of native coronary artery without angina pectoris: Secondary | ICD-10-CM

## 2015-05-23 DIAGNOSIS — E1159 Type 2 diabetes mellitus with other circulatory complications: Secondary | ICD-10-CM

## 2015-05-23 DIAGNOSIS — I25810 Atherosclerosis of coronary artery bypass graft(s) without angina pectoris: Secondary | ICD-10-CM

## 2015-05-23 DIAGNOSIS — E785 Hyperlipidemia, unspecified: Secondary | ICD-10-CM

## 2015-05-23 DIAGNOSIS — F32A Depression, unspecified: Secondary | ICD-10-CM

## 2015-05-23 NOTE — Assessment & Plan Note (Signed)
Currently with no symptoms of angina. No further workup at this time. Continue current medication regimen. 

## 2015-05-23 NOTE — Assessment & Plan Note (Signed)
We have encouraged continued exercise, careful diet management in an effort to lose weight. Stress the importance of avoiding sodas

## 2015-05-23 NOTE — Progress Notes (Signed)
Patient ID: Randy Frazier, male    DOB: 03/29/1927, 82 y.o.   MRN: [id number]  HPI Comments: Randy Frazier is a very pleasant 82 year-old gentleman with a history of coronary artery disease, bypass surgery in 2009 at [facility], poorly controlled diabetes, history of hyperlipidemia and hypertension who presents for routine followup of his coronary artery disease He has a history of stroke  In follow-up today, he reports doing relatively well Hemoglobin A1c climbed up to 11.4 denies any dramatic dietary changes Started on insulin, now has improved daily sugar numbers 100 up to 120. Takes 20 units of insulin Previous hemoglobin A1c of 7. He reports he cut out all sodas, other sweet drinks, was drinking lots of RuM mixers, He has lost several pounds since his last clinic visit  He reports that he stopped his fluoxetine on his own abruptly, did not have side effects Does not feel that he is depressed  He has significant leg cramping at nighttime Problems with BPH, urinary flow Lab work reviewed with him showing total cholesterol 153, LDL 73 He does not want to increase his Lipitor as his cramping gets worse Denies having any chest pain concerning for angina  EKG on today's visit shows normal sinus rhythm with rate 68 bpm, right bundle branch block, left axis deviation  Other past medical history Prior stroke stroke he had word finding difficulty. He had depression after the loss of his wife.   Seen in the hospital emergency room 03/30/2013 for chest pain. Workup was essentially normal, negative cardiac enzymes, creatinine 1.54 with BUN of 20, normal LFTs. EKG was unchanged with right bundle branch block  Hemoglobin A1c 7.3, total cholesterol 127, HDL 37, LDL 60  [place]. He was admitted to a hospital there and had full workup including carotid ultrasound, head CT, MRI and echocardiogram. Presumably he was placed on a  telemetry monitor during his hospital admission. Cardiac CT and MRI showed left posterior temporal lobe stroke.  Carotid ultrasound showed mild plaquing bilaterally Echocardiogram ejection fraction 60-65%, mildly dilated left atrium, otherwise normal study  Cardiac catheterization notes from August 2009 shows a proximal left main lesion of 40%, proximal LAD lesion 60%, mid LAD lesion 95%, distal LAD lesion 70%, proximal circumflex and 60% followed by a 90% lesion with 99% proximal RCA disease, 70% distal RCA disease, 100% stenosis of the PDA vessel. He was transferred to [facility] for bypass surgery previous records are not available to [place] at this time.   Echocardiogram from December 2009 shows mild mitral regurgitation, mild pulmonic regurgitation, trace TR, otherwise normal LV size and systolic function with mild LVH and mild diastolic dysfunction.   Allergies  Allergen Reactions  . Ibuprofen   . Penicillins     Current Outpatient Prescriptions on File Prior to Visit  Medication Sig Dispense Refill  . ALPRAZolam (XANAX) 0.25 MG tablet Take 1 tablet (0.25 mg total) by mouth daily as needed for anxiety. 4 tablet 0  . amLODipine (NORVASC) 10 MG tablet TAKE 1 TABLET BY MOUTH EVERY DAY 30 tablet 6  . aspirin 162 MG EC tablet 162 mg daily.    [name] atorvastatin (LIPITOR) 10 MG tablet TAKE 1 TABLET BY MOUTH EVERY DAY 30 tablet 0  . clopidogrel (PLAVIX) 75 MG tablet TAKE 1 TABLET BY MOUTH EVERY DAY 30 tablet 6  . colchicine 0.6 MG tablet Take 1 tablet (0.6 mg total) by mouth 2 (two) times  daily as needed. 30 tablet 1  . Fluoxetine HCl, PMDD, 20 MG CAPS Take 20 mg by mouth daily.    [name] gabapentin (NEURONTIN) 300 MG capsule Take 300 mg by mouth 3 (three) times daily.     [name] lisinopril (PRINIVIL,ZESTRIL) 20 MG tablet Take 20 mg by mouth daily.     . Melatonin 500 MCG TBDP Take 500 mcg by mouth at bedtime.    . metFORMIN (GLUCOPHAGE) 500 MG tablet Take 1,000 mg by mouth 2 (two) times daily with a  meal.     . metoprolol tartrate (LOPRESSOR) 25 MG tablet Take 25 mg by mouth 2 (two) times daily.      [name] omeprazole (PRILOSEC) 40 MG capsule Take 40 mg by mouth daily.      . Tamsulosin HCl (FLOMAX) 0.4 MG CAPS Take 0.4 mg by mouth daily.     No current facility-administered medications on file prior to visit.    Past Medical History  Diagnosis Date  . Hypertension   . Hyperlipidemia   . Arrhythmia     A-Fib  . Coronary artery disease 2009    CABG X 3  @ [facility]  . GERD (gastroesophageal reflux disease)   . Enlarged prostate   . Renal disorder   . CVA (cerebral infarction) 2014  . Diabetes mellitus without complication ([place])   . BPH (benign prostatic hyperplasia)     Past Surgical History  Procedure Laterality Date  . Coronary artery bypass graft  2009    CABG X 3 @ [facility]  . Cataract surgery Bilateral     Social History  reports that he has never smoked. He has never used smokeless tobacco. He reports that he does not drink alcohol or use illicit drugs.  Family History Family history is unknown by patient.        Review of Systems  Constitutional: Negative.   Respiratory: Negative.   Cardiovascular: Negative.   Gastrointestinal: Negative.   Musculoskeletal: Negative.   Skin: Negative.   Neurological: Negative.        Problems with his speech  Hematological: Negative.   Psychiatric/Behavioral: Negative.   All other systems reviewed and are negative.  BP 118/68 mmHg  Pulse 68  Ht 6' (1.829 m)  Wt 176 lb (79.833 kg)  BMI 23.86 kg/m2  Physical Exam  Constitutional: He is oriented to person, place, and time. He appears well-developed and well-nourished.  HENT:  Head: Normocephalic.  Nose: Nose normal.  Mouth/Throat: Oropharynx is clear and moist.  Eyes: Conjunctivae are normal. Pupils are equal, round, and reactive to light.  Neck: Normal range of motion. Neck supple. No JVD present.  Cardiovascular: Normal rate, regular rhythm, S1 normal, S2 normal, normal  heart sounds and intact distal pulses.  Exam reveals no gallop and no friction rub.   No murmur heard. Pulmonary/Chest: Effort normal and breath sounds normal. No respiratory distress. He has no wheezes. He has no rales. He exhibits no tenderness.  Abdominal: Soft. Bowel sounds are normal. He exhibits no distension. There is no tenderness.  Musculoskeletal: Normal range of motion. He exhibits no edema or tenderness.  Lymphadenopathy:    He has no cervical adenopathy.  Neurological: He is alert and oriented to person, place, and time. Coordination normal.  Skin: Skin is warm and dry. No rash noted. No erythema.  Psychiatric: He has a normal mood and affect. His behavior is normal. Judgment and thought content normal.      Assessment and Plan   Nursing note  and vitals reviewed.

## 2015-05-23 NOTE — Assessment & Plan Note (Signed)
He has indicated that he stop the fluoxetine abruptly, does not feel depressed, "none of his friends say so".    Total encounter time more than 25 minutes  Greater than 50% was spent in counseling and coordination of care with the patient

## 2015-05-23 NOTE — Assessment & Plan Note (Signed)
He does not want a higher dose Lipitor secondary to myalgias, we'll continue low-dose

## 2015-05-23 NOTE — Patient Instructions (Signed)
You are doing well. No medication changes were made.  Please call us if you have new issues that need to be addressed before your next appt.  Your physician wants you to follow-up in: 6 months.  You will receive a reminder letter in the mail two months in advance. If you don't receive a letter, please call our office to schedule the follow-up appointment.   

## 2015-05-23 NOTE — Assessment & Plan Note (Signed)
Blood pressure is well controlled on today's visit. No changes made to the medications. 

## 2015-05-26 ENCOUNTER — Other Ambulatory Visit: Payer: Self-pay | Admitting: Cardiovascular Disease

## 2015-06-17 ENCOUNTER — Emergency Department
Admission: EM | Admit: 2015-06-17 | Discharge: 2015-06-17 | Disposition: A | Discharge status: Home / Self Care | Payer: Medicare Other | Attending: Emergency Medicine | Admitting: Emergency Medicine

## 2015-06-17 ENCOUNTER — Emergency Department: Payer: Medicare Other

## 2015-06-17 ENCOUNTER — Encounter: Payer: Self-pay | Admitting: Emergency Medicine

## 2015-06-17 DIAGNOSIS — J029 Acute pharyngitis, unspecified: Secondary | ICD-10-CM | POA: Insufficient documentation

## 2015-06-17 DIAGNOSIS — F329 Major depressive disorder, single episode, unspecified: Secondary | ICD-10-CM | POA: Diagnosis not present

## 2015-06-17 DIAGNOSIS — E785 Hyperlipidemia, unspecified: Secondary | ICD-10-CM | POA: Diagnosis not present

## 2015-06-17 DIAGNOSIS — R739 Hyperglycemia, unspecified: Secondary | ICD-10-CM

## 2015-06-17 DIAGNOSIS — I2510 Atherosclerotic heart disease of native coronary artery without angina pectoris: Secondary | ICD-10-CM | POA: Insufficient documentation

## 2015-06-17 DIAGNOSIS — Z7982 Long term (current) use of aspirin: Secondary | ICD-10-CM | POA: Insufficient documentation

## 2015-06-17 DIAGNOSIS — I10 Essential (primary) hypertension: Secondary | ICD-10-CM | POA: Diagnosis not present

## 2015-06-17 DIAGNOSIS — Z794 Long term (current) use of insulin: Secondary | ICD-10-CM | POA: Insufficient documentation

## 2015-06-17 DIAGNOSIS — E1165 Type 2 diabetes mellitus with hyperglycemia: Secondary | ICD-10-CM | POA: Insufficient documentation

## 2015-06-17 DIAGNOSIS — Z7984 Long term (current) use of oral hypoglycemic drugs: Secondary | ICD-10-CM | POA: Insufficient documentation

## 2015-06-17 DIAGNOSIS — Z8673 Personal history of transient ischemic attack (TIA), and cerebral infarction without residual deficits: Secondary | ICD-10-CM | POA: Insufficient documentation

## 2015-06-17 DIAGNOSIS — Z79899 Other long term (current) drug therapy: Secondary | ICD-10-CM | POA: Insufficient documentation

## 2015-06-17 DIAGNOSIS — J069 Acute upper respiratory infection, unspecified: Secondary | ICD-10-CM | POA: Insufficient documentation

## 2015-06-17 DIAGNOSIS — J320 Chronic maxillary sinusitis: Secondary | ICD-10-CM | POA: Diagnosis not present

## 2015-06-17 DIAGNOSIS — H109 Unspecified conjunctivitis: Secondary | ICD-10-CM | POA: Insufficient documentation

## 2015-06-17 DIAGNOSIS — Z951 Presence of aortocoronary bypass graft: Secondary | ICD-10-CM | POA: Insufficient documentation

## 2015-06-17 DIAGNOSIS — R05 Cough: Secondary | ICD-10-CM | POA: Diagnosis present

## 2015-06-17 LAB — CBC
HCT: 38.5 % — ABNORMAL LOW (ref 40.0–52.0)
HEMOGLOBIN: 13 g/dL (ref 13.0–18.0)
MCH: 31.6 pg (ref 26.0–34.0)
MCHC: 33.7 g/dL (ref 32.0–36.0)
MCV: 93.7 fL (ref 80.0–100.0)
Platelets: 305 10*3/uL (ref 150–440)
RBC: 4.1 MIL/uL — ABNORMAL LOW (ref 4.40–5.90)
RDW: 14.9 % — AB (ref 11.5–14.5)
WBC: 8.6 10*3/uL (ref 3.8–10.6)

## 2015-06-17 LAB — TROPONIN I: Troponin I: <0.03 ng/mL (ref <0.031)

## 2015-06-17 LAB — BASIC METABOLIC PANEL
ANION GAP: 13 (ref 5–15)
BUN: 22 mg/dL — AB (ref 6–20)
CALCIUM: 8.9 mg/dL (ref 8.9–10.3)
CO2: 23 mmol/L (ref 22–32)
Chloride: 100 mmol/L — ABNORMAL LOW (ref 101–111)
Creatinine, Ser: 1.27 mg/dL — ABNORMAL HIGH (ref 0.61–1.24)
GFR calc Af Amer: 58 mL/min — ABNORMAL LOW (ref >60)
GFR, EST NON AFRICAN AMERICAN: 50 mL/min — AB (ref >60)
GLUCOSE: 170 mg/dL — AB (ref 65–99)
Potassium: 3.9 mmol/L (ref 3.5–5.1)
SODIUM: 136 mmol/L (ref 135–145)

## 2015-06-17 LAB — RAPID INFLUENZA A&B ANTIGENS (ARMC ONLY): INFLUENZA B (ARMC): NEGATIVE

## 2015-06-17 LAB — RAPID INFLUENZA A&B ANTIGENS: Influenza A (ARMC): NEGATIVE

## 2015-06-17 MED ORDER — ACETAMINOPHEN 500 MG PO TABS
1000.0000 mg | ORAL_TABLET | Freq: Once | ORAL | Status: AC
  Administered 2015-06-17: 1000 mg via ORAL
  Filled 2015-06-17: qty 2

## 2015-06-17 MED ORDER — BENZONATATE 100 MG PO CAPS: 100.0000 mg | ORAL_CAPSULE | Freq: Four times a day (QID) | ORAL | Status: DC | PRN

## 2015-06-17 MED ORDER — ONDANSETRON 4 MG PO TBDP
ORAL_TABLET | ORAL | Status: AC
  Administered 2015-06-17: 4 mg
  Filled 2015-06-17: qty 1

## 2015-06-17 MED ORDER — ONDANSETRON HCL 4 MG PO TABS: 4.0000 mg | ORAL_TABLET | Freq: Once | ORAL | Status: DC

## 2015-06-17 MED ORDER — SODIUM CHLORIDE-SODIUM BICARB 2300-700 MG NA KIT: 1.0000 | PACK | Freq: Once | NASAL | Status: DC

## 2015-06-17 MED ORDER — LEVOFLOXACIN 750 MG PO TABS: 750.0000 mg | ORAL_TABLET | Freq: Every day | ORAL | Status: AC

## 2015-06-17 NOTE — ED Notes (Signed)
Returned to room.

## 2015-06-17 NOTE — ED Notes (Signed)
Pt says he's been feeling bad for about a week; sleeping more than usual; productive cough of green sputum; shortness of breath with exertion; denies chest pain; talking in complete coherent sentences

## 2015-06-17 NOTE — ED Provider Notes (Signed)
[facility] Emergency Department Provider Note  ____________________________________________  Time seen: Approximately 8:08 PM  I have reviewed the triage vital signs and the nursing notes.   HISTORY  Chief Complaint Nausea; Cough; and Shortness of Breath    HPI RANI SISNEY is a 82 y.o. male with a history of HTN, HL, CAD, DM presenting with cough, congestion, facial fullness, rhinorrhea. The patient reports that for 7 days he has had a cough that is productive of a small amount of green phlegm. He also has a sensation of facial fullness when he leans forward and a postnasal drip. He reports significant congestion and rhinorrhea without ear pain. Mild sore throat. No fever or chills, nausea vomiting diarrhea or abdominal pain. He has tried Mucinex with mild improvement.   Past Medical History  Diagnosis Date  . Hypertension   . Hyperlipidemia   . Arrhythmia     A-Fib  . Coronary artery disease 2009    CABG X 3  @ [facility]  . GERD (gastroesophageal reflux disease)   . Enlarged prostate   . Renal disorder   . CVA (cerebral infarction) 2014  . Diabetes mellitus without complication ([place])   . BPH (benign prostatic hyperplasia)     Patient Active Problem List   Diagnosis Date Noted  . Stroke ([place]) 08/18/2012  . Gout 08/18/2012  . Depression 07/31/2011  . Diabetes mellitus ([place]) 06/19/2010  . Hyperlipidemia 12/11/2009  . HYPERTENSION, BENIGN 12/11/2009  . CAD (coronary artery disease) 12/11/2009  . CAD, AUTOLOGOUS BYPASS GRAFT 12/11/2009    Past Surgical History  Procedure Laterality Date  . Coronary artery bypass graft  2009    CABG X 3 @ [facility]  . Cataract surgery Bilateral     Current Outpatient Rx  Name  Route  Sig  Dispense  Refill  . ALPRAZolam (XANAX) 0.25 MG tablet   Oral   Take 1 tablet (0.25 mg total) by mouth daily as needed for anxiety.   4 tablet   0   . amLODipine (NORVASC) 10 MG tablet      TAKE 1 TABLET BY MOUTH EVERY  DAY   30 tablet   6   . aspirin 162 MG EC tablet      162 mg daily.         [name] atorvastatin (LIPITOR) 10 MG tablet      TAKE 1 TABLET BY MOUTH EVERY DAY   30 tablet   0   . clopidogrel (PLAVIX) 75 MG tablet      TAKE 1 TABLET BY MOUTH EVERY DAY   30 tablet   6   . colchicine 0.6 MG tablet   Oral   Take 1 tablet (0.6 mg total) by mouth 2 (two) times daily as needed.   30 tablet   1   . Fluoxetine HCl, PMDD, 20 MG CAPS   Oral   Take 20 mg by mouth daily.         [name] gabapentin (NEURONTIN) 300 MG capsule   Oral   Take 300 mg by mouth 3 (three) times daily.          . insulin glargine (LANTUS) 100 UNIT/ML injection   Subcutaneous   Inject 10 Units into the skin at bedtime.         [name] lisinopril (PRINIVIL,ZESTRIL) 20 MG tablet   Oral   Take 20 mg by mouth daily.          . metFORMIN (GLUCOPHAGE) 500 MG tablet  Oral   Take 1,000 mg by mouth 2 (two) times daily with a meal.          . metoprolol tartrate (LOPRESSOR) 25 MG tablet   Oral   Take 25 mg by mouth 2 (two) times daily.           [name] omeprazole (PRILOSEC) 40 MG capsule   Oral   Take 40 mg by mouth daily.           . Tamsulosin HCl (FLOMAX) 0.4 MG CAPS   Oral   Take 0.4 mg by mouth daily.         . benzonatate (TESSALON PERLES) 100 MG capsule   Oral   Take 1 capsule (100 mg total) by mouth every 6 (six) hours as needed for cough.   15 capsule   0   . insulin glargine (LANTUS) 100 UNIT/ML injection   Subcutaneous   Inject 10 Units into the skin 2 (two) times daily.         [name] levofloxacin (LEVAQUIN) 750 MG tablet   Oral   Take 1 tablet (750 mg total) by mouth daily.   10 tablet   0   . Melatonin 500 MCG TBDP   Oral   Take 500 mcg by mouth at bedtime.         . Sodium Chloride-Sodium Bicarb (CLASSIC NETI POT SINUS WASH) 2300-700 MG KIT   Nasal   Place 1 Dose into the nose once.   1 each   0     Allergies Ibuprofen and Penicillins  Family History  Problem Relation  Age of Onset  . Family history unknown: Yes    Social History Social History  Substance Use Topics  . Smoking status: Never Smoker   . Smokeless tobacco: Never Used  . Alcohol Use: No     Comment: occasional    Review of Systems Constitutional: No fever/chills.No lightheadedness or syncope. Positive general fatigue. Eyes: No visual changes. Positive eye discharge, right greater than left. ENT: Mild sore throat. Positive congestion and rhinorrhea.. Cardiovascular: Denies chest pain. Denies palpitations. Respiratory: Denies shortness of breath.  Positive cough. Gastrointestinal: No abdominal pain.  No nausea, no vomiting.  No diarrhea.  No constipation. Genitourinary: Negative for dysuria. Musculoskeletal: Negative for back pain. Skin: Negative for rash. Neurological: Negative for headaches. No focal numbness, tingling or weakness.   10-point ROS otherwise negative.  ____________________________________________   PHYSICAL EXAM:  VITAL SIGNS: ED Triage Vitals  Enc Vitals Group     BP 06/17/15 1918 147/78 mmHg     Pulse Rate 06/17/15 1918 88     Resp 06/17/15 1918 20     Temp 06/17/15 1918 99 F (37.2 C)     Temp Source 06/17/15 1918 Oral     SpO2 06/17/15 1918 98 %     Weight 06/17/15 1918 180 lb (81.647 kg)     Height 06/17/15 1918 6' (1.829 m)     Head Cir --      Peak Flow --      Pain Score 06/17/15 1918 0     Pain Loc --      Pain Edu? --      Excl. in [place]? --     Constitutional: Alert and oriented. Well appearing and in no acute distress. Answers questions appropriately. Eyes:   EOMI. No scleral icterus.Positive yellowish eye discharge right greater than left. Positive conjunctival erythema bilaterally. Head: Atraumatic.Positive mild tenderness to palpation over the maxillary sinus. No pain  over the frontal sinus. Nose: Positive congestion/rhinnorhea. Mouth/Throat: Mucous membranes are moist. Mild posterior pharyngeal erythema without tonsillar swelling or  exudate. The posterior palate is symmetric and uvula is midline. Neck: No stridor.  Supple.  No JVD. Cardiovascular: Normal rate, regular rhythm. No murmurs, rubs or gallops.  Respiratory: Normal respiratory effort.  No accessory muscle use or retractions. Lungs CTAB.  No wheezes, rales or ronchi. Gastrointestinal: Soft, nontender and nondistended.  No guarding or rebound.  No peritoneal signs. Musculoskeletal: No LE edema. No ttp in the calves or palpable cords.  Negative Homan's sign. Neurologic:  A&Ox3.  Speech is clear.  Face and smile are symmetric.  EOMI.  Moves all extremities well. Skin:  Skin is warm, dry and intact. No rash noted. Psychiatric: Mood and affect are normal. Speech and behavior are normal.  Normal judgement.  ____________________________________________   LABS (all labs ordered are listed, but only abnormal results are displayed)  Labs Reviewed  BASIC METABOLIC PANEL - Abnormal; Notable for the following:    Chloride 100 (*)    Glucose, Bld 170 (*)    BUN 22 (*)    Creatinine, Ser 1.27 (*)    GFR calc non Af Amer 50 (*)    GFR calc Af Amer 58 (*)    All other components within normal limits  CBC - Abnormal; Notable for the following:    RBC 4.10 (*)    HCT 38.5 (*)    RDW 14.9 (*)    All other components within normal limits  RAPID INFLUENZA A&B ANTIGENS ([facility] ONLY)  TROPONIN I   ____________________________________________  EKG  ED ECG REPORT I, Eula Listen, the attending physician, personally viewed and interpreted this ECG.   Date: 06/17/2015  EKG Time: 1931  Rate: 85  Rhythm: normal sinus rhythm, RBBB  Axis: Leftward  Intervals:none  ST&T Change: No STEMI.  EKG Is grossly unchanged from 4/17 ____________________________________________  RADIOLOGY  Dg Chest 2 View  06/17/2015  CLINICAL DATA:  Cough and shortness of breath. EXAM: CHEST  2 VIEW COMPARISON:  None available FINDINGS: Normal heart size and mediastinal contours. The  patient is status post CABG. There is no edema, consolidation, effusion, or pneumothorax. Mid thoracic degenerative disc disease. No acute osseous finding. IMPRESSION: No evidence of active disease. Electronically Signed   By: Monte Fantasia M.D.   On: 06/17/2015 20:25    ____________________________________________   PROCEDURES  Procedure(s) performed: None  Critical Care performed: No ____________________________________________   INITIAL IMPRESSION / ASSESSMENT AND PLAN / ED COURSE  Pertinent labs & imaging results that were available during my care of the patient were reviewed by me and considered in my medical decision making (see chart for details).  82 y.o. male with 7 days of cough, congestion and rhinorrhea, facial fullness, postnasal drip without fever or shortness of breath. The most likely etiology is a viral process including viral conjunctivitis and viral pharyngitis. Given the facial fullness however, I will plan at least to treat the patient for sinusitis. We will get a chest x-ray to rule out pneumonia.  ----------------------------------------- 8:15 PM on 06/17/2015 -----------------------------------------  Patient has some mild renal insufficiency, but he has had a creatinine of 1.5 in 2015 sig this is likely chronic.  ----------------------------------------- 9:30 PM on 06/17/2015 -----------------------------------------  The patient's evaluation here is reassuring and he does not have influenza or pneumonia. We'll discharge him home with symptom at a treatment for his likely viral URI . I will also treat him for sinusitis with  antibiotics. He understands that he needs close follow-up with his primary care physician, and return precautions were discussed. ____________________________________________  FINAL CLINICAL IMPRESSION(S) / ED DIAGNOSES  Final diagnoses:  Hyperglycemia  URI (upper respiratory infection)  Maxillary sinusitis, unspecified chronicity   Pharyngitis  Bilateral conjunctivitis      NEW MEDICATIONS STARTED DURING THIS VISIT:  New Prescriptions   BENZONATATE (TESSALON PERLES) 100 MG CAPSULE    Take 1 capsule (100 mg total) by mouth every 6 (six) hours as needed for cough.   LEVOFLOXACIN (LEVAQUIN) 750 MG TABLET    Take 1 tablet (750 mg total) by mouth daily.   SODIUM CHLORIDE-SODIUM BICARB (CLASSIC NETI POT SINUS WASH) 2300-700 MG KIT    Place 1 Dose into the nose once.     Eula Listen, MD 06/17/15 2131

## 2015-06-17 NOTE — ED Notes (Signed)
Patient transported to X-ray 

## 2015-06-17 NOTE — Discharge Instructions (Signed)
Please drink plenty of fluid to stay well hydrated. Take the entire course of antibiotics, even if you're feeling better. Return to the emergency department if you developd shortness of breath, inability to keep down fluids, lightheadedness or fainting, or any other symptoms concerning to you.

## 2015-06-19 DIAGNOSIS — M25579 Pain in unspecified ankle and joints of unspecified foot: Secondary | ICD-10-CM | POA: Insufficient documentation

## 2015-07-03 DIAGNOSIS — S92353A Displaced fracture of fifth metatarsal bone, unspecified foot, initial encounter for closed fracture: Secondary | ICD-10-CM | POA: Insufficient documentation

## 2015-08-10 DIAGNOSIS — E1142 Type 2 diabetes mellitus with diabetic polyneuropathy: Secondary | ICD-10-CM | POA: Insufficient documentation

## 2015-08-10 DIAGNOSIS — E119 Type 2 diabetes mellitus without complications: Secondary | ICD-10-CM | POA: Insufficient documentation

## 2015-08-14 ENCOUNTER — Emergency Department
Admission: EM | Admit: 2015-08-14 | Discharge: 2015-08-15 | Disposition: A | Discharge status: Home / Self Care | Payer: Medicare Other | Attending: Emergency Medicine | Admitting: Emergency Medicine

## 2015-08-14 ENCOUNTER — Emergency Department: Payer: Medicare Other

## 2015-08-14 DIAGNOSIS — F329 Major depressive disorder, single episode, unspecified: Secondary | ICD-10-CM | POA: Diagnosis not present

## 2015-08-14 DIAGNOSIS — I10 Essential (primary) hypertension: Secondary | ICD-10-CM | POA: Diagnosis not present

## 2015-08-14 DIAGNOSIS — I4891 Unspecified atrial fibrillation: Secondary | ICD-10-CM | POA: Diagnosis not present

## 2015-08-14 DIAGNOSIS — E785 Hyperlipidemia, unspecified: Secondary | ICD-10-CM | POA: Diagnosis not present

## 2015-08-14 DIAGNOSIS — I2510 Atherosclerotic heart disease of native coronary artery without angina pectoris: Secondary | ICD-10-CM | POA: Diagnosis not present

## 2015-08-14 DIAGNOSIS — E119 Type 2 diabetes mellitus without complications: Secondary | ICD-10-CM | POA: Insufficient documentation

## 2015-08-14 DIAGNOSIS — Z794 Long term (current) use of insulin: Secondary | ICD-10-CM | POA: Insufficient documentation

## 2015-08-14 DIAGNOSIS — Z7982 Long term (current) use of aspirin: Secondary | ICD-10-CM | POA: Diagnosis not present

## 2015-08-14 DIAGNOSIS — K529 Noninfective gastroenteritis and colitis, unspecified: Secondary | ICD-10-CM | POA: Diagnosis not present

## 2015-08-14 DIAGNOSIS — Z955 Presence of coronary angioplasty implant and graft: Secondary | ICD-10-CM | POA: Diagnosis not present

## 2015-08-14 DIAGNOSIS — Z8673 Personal history of transient ischemic attack (TIA), and cerebral infarction without residual deficits: Secondary | ICD-10-CM | POA: Diagnosis not present

## 2015-08-14 DIAGNOSIS — Z7984 Long term (current) use of oral hypoglycemic drugs: Secondary | ICD-10-CM | POA: Diagnosis not present

## 2015-08-14 DIAGNOSIS — K8020 Calculus of gallbladder without cholecystitis without obstruction: Secondary | ICD-10-CM | POA: Insufficient documentation

## 2015-08-14 DIAGNOSIS — Z79899 Other long term (current) drug therapy: Secondary | ICD-10-CM | POA: Insufficient documentation

## 2015-08-14 DIAGNOSIS — R109 Unspecified abdominal pain: Secondary | ICD-10-CM | POA: Diagnosis present

## 2015-08-14 LAB — COMPREHENSIVE METABOLIC PANEL
ALBUMIN: 4.5 g/dL (ref 3.5–5.0)
ALK PHOS: 59 U/L (ref 38–126)
ALT: 23 U/L (ref 17–63)
ANION GAP: 12 (ref 5–15)
AST: 28 U/L (ref 15–41)
BUN: 15 mg/dL (ref 6–20)
CALCIUM: 9.4 mg/dL (ref 8.9–10.3)
CHLORIDE: 99 mmol/L — AB (ref 101–111)
CO2: 25 mmol/L (ref 22–32)
Creatinine, Ser: 1.11 mg/dL (ref 0.61–1.24)
GFR calc Af Amer: >60 mL/min (ref >60)
GFR calc non Af Amer: 59 mL/min — ABNORMAL LOW (ref >60)
GLUCOSE: 161 mg/dL — AB (ref 65–99)
POTASSIUM: 3.9 mmol/L (ref 3.5–5.1)
SODIUM: 136 mmol/L (ref 135–145)
Total Bilirubin: 0.8 mg/dL (ref 0.3–1.2)
Total Protein: 8.4 g/dL — ABNORMAL HIGH (ref 6.5–8.1)

## 2015-08-14 LAB — URINALYSIS COMPLETE WITH MICROSCOPIC (ARMC ONLY)
BACTERIA UA: NONE SEEN
Bilirubin Urine: NEGATIVE
GLUCOSE, UA: NEGATIVE mg/dL
Ketones, ur: NEGATIVE mg/dL
LEUKOCYTES UA: NEGATIVE
NITRITE: NEGATIVE
PH: 5 (ref 5.0–8.0)
Protein, ur: 30 mg/dL — AB
SPECIFIC GRAVITY, URINE: 1.02 (ref 1.005–1.030)
SQUAMOUS EPITHELIAL / LPF: NONE SEEN

## 2015-08-14 LAB — CBC
HCT: 43.7 % (ref 40.0–52.0)
Hemoglobin: 14.6 g/dL (ref 13.0–18.0)
MCH: 31.1 pg (ref 26.0–34.0)
MCHC: 33.5 g/dL (ref 32.0–36.0)
MCV: 92.9 fL (ref 80.0–100.0)
PLATELETS: 291 10*3/uL (ref 150–440)
RBC: 4.7 MIL/uL (ref 4.40–5.90)
RDW: 16.1 % — ABNORMAL HIGH (ref 11.5–14.5)
WBC: 6.4 10*3/uL (ref 3.8–10.6)

## 2015-08-14 LAB — TROPONIN I

## 2015-08-14 LAB — LIPASE, BLOOD: LIPASE: 34 U/L (ref 11–51)

## 2015-08-14 LAB — LACTIC ACID, PLASMA: Lactic Acid, Venous: 2.2 mmol/L (ref 0.5–1.9)

## 2015-08-14 MED ORDER — MORPHINE SULFATE (PF) 4 MG/ML IV SOLN
4.0000 mg | Freq: Once | INTRAVENOUS | Status: AC
  Administered 2015-08-14: 4 mg via INTRAVENOUS
  Filled 2015-08-14: qty 1

## 2015-08-14 MED ORDER — OXYCODONE-ACETAMINOPHEN 5-325 MG PO TABS
2.0000 | ORAL_TABLET | Freq: Once | ORAL | Status: AC
  Administered 2015-08-15: 2 via ORAL
  Filled 2015-08-14: qty 2

## 2015-08-14 MED ORDER — DIATRIZOATE MEGLUMINE & SODIUM 66-10 % PO SOLN
15.0000 mL | Freq: Once | ORAL | Status: AC
  Administered 2015-08-14: 15 mL via ORAL

## 2015-08-14 MED ORDER — IOPAMIDOL (ISOVUE-370) INJECTION 76%
100.0000 mL | Freq: Once | INTRAVENOUS | Status: AC | PRN
  Administered 2015-08-14: 100 mL via INTRAVENOUS

## 2015-08-14 MED ORDER — SODIUM CHLORIDE 0.9 % IV BOLUS (SEPSIS)
500.0000 mL | INTRAVENOUS | Status: AC
  Administered 2015-08-15: 500 mL via INTRAVENOUS

## 2015-08-14 MED ORDER — ONDANSETRON HCL 4 MG/2ML IJ SOLN
4.0000 mg | INTRAMUSCULAR | Status: AC
  Administered 2015-08-14: 4 mg via INTRAVENOUS
  Filled 2015-08-14: qty 2

## 2015-08-14 MED ORDER — SODIUM CHLORIDE 0.9 % IV BOLUS (SEPSIS)
500.0000 mL | INTRAVENOUS | Status: AC
  Administered 2015-08-14: 500 mL via INTRAVENOUS

## 2015-08-14 NOTE — ED Notes (Signed)
Patient transported to CT 

## 2015-08-14 NOTE — ED Notes (Signed)
Pt in with co left flank pain radiates down left leg and left groin area. Denies any dysuria at this time, has nausea no vomiting. Denies any hx of the same or injury.

## 2015-08-14 NOTE — ED Provider Notes (Signed)
[facility] Emergency Department Provider Note  ____________________________________________  Time seen: Approximately 8:25 PM  I have reviewed the triage vital signs and the nursing notes.   HISTORY  Chief Complaint Flank Pain    HPI Randy Frazier is a 82 y.o. male who is relatively healthy and active for his age and presents for evaluation of acute onset severe sharp stabbing pain in his left flank that radiates around to his left groin and testicles.  He reports that it awoke him from sleep at about 3 AM and has been persistent ever since.  It is worse with palpation of his left flank and with certain movements and helped a little bit depending on his position.  He denies dysuria and gross hematuria.  He has had a couple of loose stools and has been nauseated but has not vomited today.  He denies fever/chills, chest pain, shortness of breath.  He has some pain in the left side of his abdomen that he feels this coming from his left flank.  He states it feels exactly like prior kidney stones he has had in the past.   Past Medical History  Diagnosis Date  . Hypertension   . Hyperlipidemia   . Arrhythmia     A-Fib  . Coronary artery disease 2009    CABG X 3  @ [facility]  . GERD (gastroesophageal reflux disease)   . Enlarged prostate   . Renal disorder   . CVA (cerebral infarction) 2014  . Diabetes mellitus without complication ([place])   . BPH (benign prostatic hyperplasia)     Patient Active Problem List   Diagnosis Date Noted  . Stroke ([place]) 08/18/2012  . Gout 08/18/2012  . Depression 07/31/2011  . Diabetes mellitus ([place]) 06/19/2010  . Hyperlipidemia 12/11/2009  . HYPERTENSION, BENIGN 12/11/2009  . CAD (coronary artery disease) 12/11/2009  . CAD, AUTOLOGOUS BYPASS GRAFT 12/11/2009    Past Surgical History  Procedure Laterality Date  . Coronary artery bypass graft  2009    CABG X 3 @ [facility]  . Cataract surgery Bilateral     Current  Outpatient Rx  Name  Route  Sig  Dispense  Refill  . ALPRAZolam (XANAX) 0.25 MG tablet   Oral   Take 1 tablet (0.25 mg total) by mouth daily as needed for anxiety.   4 tablet   0   . amLODipine (NORVASC) 10 MG tablet      TAKE 1 TABLET BY MOUTH EVERY DAY   30 tablet   6   . aspirin 162 MG EC tablet      162 mg daily.         [name] atorvastatin (LIPITOR) 10 MG tablet      TAKE 1 TABLET BY MOUTH EVERY DAY   30 tablet   0   . benzonatate (TESSALON PERLES) 100 MG capsule   Oral   Take 1 capsule (100 mg total) by mouth every 6 (six) hours as needed for cough.   15 capsule   0   . clopidogrel (PLAVIX) 75 MG tablet      TAKE 1 TABLET BY MOUTH EVERY DAY   30 tablet   6   . colchicine 0.6 MG tablet   Oral   Take 1 tablet (0.6 mg total) by mouth 2 (two) times daily as needed.   30 tablet   1   . Fluoxetine HCl, PMDD, 20 MG CAPS   Oral   Take 20 mg by mouth daily.         [name]  gabapentin (NEURONTIN) 300 MG capsule   Oral   Take 300 mg by mouth 3 (three) times daily.          . insulin glargine (LANTUS) 100 UNIT/ML injection   Subcutaneous   Inject 10 Units into the skin 2 (two) times daily.         . insulin glargine (LANTUS) 100 UNIT/ML injection   Subcutaneous   Inject 10 Units into the skin at bedtime.         [name] lisinopril (PRINIVIL,ZESTRIL) 20 MG tablet   Oral   Take 20 mg by mouth daily.          . Melatonin 500 MCG TBDP   Oral   Take 500 mcg by mouth at bedtime.         . metFORMIN (GLUCOPHAGE) 500 MG tablet   Oral   Take 1,000 mg by mouth 2 (two) times daily with a meal.          . metoprolol tartrate (LOPRESSOR) 25 MG tablet   Oral   Take 25 mg by mouth 2 (two) times daily.           [name] omeprazole (PRILOSEC) 40 MG capsule   Oral   Take 40 mg by mouth daily.           . Sodium Chloride-Sodium Bicarb (CLASSIC NETI POT SINUS WASH) 2300-700 MG KIT   Nasal   Place 1 Dose into the nose once.   1 each   0   . Tamsulosin HCl  (FLOMAX) 0.4 MG CAPS   Oral   Take 0.4 mg by mouth daily.           Allergies Penicillins  Family History  Problem Relation Age of Onset  . Family history unknown: Yes    Social History Social History  Substance Use Topics  . Smoking status: Never Smoker   . Smokeless tobacco: Never Used  . Alcohol Use: No     Comment: occasional    Review of Systems Constitutional: No fever/chills Eyes: No visual changes. ENT: No sore throat. Cardiovascular: Denies chest pain. Respiratory: Denies shortness of breath. Gastrointestinal: No abdominal pain But severe left flank pain radiating around to the left groin.  nausea, no vomiting.  Several loose stools.  No constipation. Genitourinary: Negative for dysuria. Musculoskeletal: Negative for back pain. Skin: Negative for rash. Neurological: Negative for headaches, focal weakness or numbness.  10-point ROS otherwise negative.  ____________________________________________   PHYSICAL EXAM:  VITAL SIGNS: ED Triage Vitals  Enc Vitals Group     BP 08/14/15 1929 155/89 mmHg     Pulse Rate 08/14/15 1929 85     Resp 08/14/15 1929 18     Temp 08/14/15 1929 97.8 F (36.6 C)     Temp Source 08/14/15 1929 Oral     SpO2 08/14/15 1929 100 %     Weight 08/14/15 1929 180 lb (81.647 kg)     Height 08/14/15 1929 6' (1.829 m)     Head Cir --      Peak Flow --      Pain Score 08/14/15 1930 9     Pain Loc --      Pain Edu? --      Excl. in [place]? --     Constitutional: Alert and oriented. Well appearing and in no acute distress. Eyes: Conjunctivae are normal. PERRL. EOMI. Head: Atraumatic. Nose: No congestion/rhinnorhea. Mouth/Throat: Mucous membranes are moist.  Oropharynx non-erythematous. Neck: No stridor.  No meningeal  signs.   Cardiovascular: Normal rate, regular rhythm. Good peripheral circulation. Grossly normal heart sounds.   Respiratory: Normal respiratory effort.  No retractions. Lungs CTAB. Gastrointestinal: Soft and  nontender. No distention.  Musculoskeletal: No lower extremity tenderness nor edema. No gross deformities of extremities.  CVA tenderness on left Neurologic:  Normal speech and language. No gross focal neurologic deficits are appreciated.  Skin:  Skin is warm, dry and intact. No rash noted. Psychiatric: Mood and affect are normal. Speech and behavior are normal.  ____________________________________________   LABS (all labs ordered are listed, but only abnormal results are displayed)  Labs Reviewed  CBC - Abnormal; Notable for the following:    RDW 16.1 (*)    All other components within normal limits  COMPREHENSIVE METABOLIC PANEL - Abnormal; Notable for the following:    Chloride 99 (*)    Glucose, Bld 161 (*)    Total Protein 8.4 (*)    GFR calc non Af Amer 59 (*)    All other components within normal limits  URINALYSIS COMPLETEWITH MICROSCOPIC ([facility] ONLY) - Abnormal; Notable for the following:    Color, Urine YELLOW (*)    APPearance CLEAR (*)    Hgb urine dipstick 1+ (*)    Protein, ur 30 (*)    All other components within normal limits  LACTIC ACID, PLASMA - Abnormal; Notable for the following:    Lactic Acid, Venous 2.2 (*)    All other components within normal limits  LIPASE, BLOOD  TROPONIN I  LACTIC ACID, PLASMA   ____________________________________________  EKG  None ____________________________________________  RADIOLOGY  I, Tereka Thorley, personally discussed these images and results by phone with the on-call radiologist and used this discussion as part of my medical decision making. I also viewed the images personally.   Ct Renal Stone Study  08/14/2015  ADDENDUM REPORT: 08/14/2015 21:49 ADDENDUM: After further review, there is subtle haziness within the central abdominal mesentery, at the level of the duodenum and proximal jejunum. This is of uncertain significance, possibly edema. The adjacent small bowel is difficult to characterize due to inadequate  distention. Consider CT with oral and IV contrast for further characterization of a possible of bowel wall inflammation or ischemia. These findings and recommendations were discussed with Dr. Hinda Kehr. Per this discussion, a CT angiogram will be obtained, with additional oral contrast. Electronically Signed   By: Franki Cabot M.D.   On: 08/14/2015 21:49  08/14/2015  CLINICAL DATA:  Left flank pain radiating down left leg and left groin area. EXAM: CT ABDOMEN AND PELVIS WITHOUT CONTRAST TECHNIQUE: Multidetector CT imaging of the abdomen and pelvis was performed following the standard protocol without IV contrast. COMPARISON:  None. FINDINGS: Lower chest:  No acute findings. Hepatobiliary: Small stone in the gallbladder neck region. No gallbladder distension. No pericholecystic inflammation. Liver appears normal. Pancreas: No mass or inflammatory process identified on this un-enhanced exam. Spleen: Within normal limits in size. Adrenals/Urinary Tract: Adrenal glands appear normal. The right renal cyst has enlarged in the interval, now measuring 8.2 x 6.3 cm. Left renal cyst has also enlarged, now measuring 3.6 x 3.5 cm. No renal stone or hydronephrosis bilaterally. No perinephric fluid. No ureteral or bladder calculi identified. Stomach/Bowel: Scattered diverticulosis again noted within the sigmoid and descending colon but no inflammatory change to suggest acute diverticulitis. No large or small bowel dilatation. No evidence of bowel wall inflammation. Appendix is normal. Stomach appears normal. Vascular/Lymphatic: Scattered atherosclerotic calcifications along the walls of the normal caliber  abdominal aorta, aortic branch vessels and pelvic vasculature. No enlarged lymph nodes seen in the abdomen or pelvis. Reproductive: Prostate gland is moderately enlarged causing mass effect on the bladder base. Other: No free fluid or abscess collections seen. No free intraperitoneal air. Musculoskeletal: Degenerative  changes of the thoracolumbar spine, moderate in degree. No acute or suspicious osseous lesion. Superficial soft tissues are unremarkable. IMPRESSION: 1. No acute findings in the abdomen or pelvis. No renal or ureteral calculi. No hydronephrosis. 2. Cholelithiasis without evidence of acute cholecystitis. 3. Bilateral renal cysts. 4. Aortic atherosclerosis. 5. Prostate gland enlargement. 6. Colonic diverticulosis without evidence of acute diverticulitis. Electronically Signed: By: Franki Cabot M.D. On: 08/14/2015 21:23   Ct Cta Abd/pel W/cm &/or W/o Cm  08/14/2015  CLINICAL DATA:  Severe left flank pain. Mesenteric haziness on noncontrast CT. EXAM: CTA ABDOMEN AND PELVIS wITHOUT AND WITH CONTRAST TECHNIQUE: Multidetector CT imaging of the abdomen and pelvis was performed using the standard protocol during bolus administration of intravenous contrast. Multiplanar reconstructed images and MIPs were obtained and reviewed to evaluate the vascular anatomy. Venous phase also obtained. CONTRAST:  100 cc Isovue 370 IV COMPARISON:  Noncontrast CT 2 hours prior. FINDINGS: Lower chest:  The included lung bases are clear. Liver: Focal fatty infiltration adjacent with falciform ligament. No focal lesion. Hepatobiliary: Small gallstone. No abnormal gallbladder distention. No biliary dilatation. Pancreas: No ductal dilatation or inflammation. Small calcifications adjacent to pancreatic head. Spleen: Normal. Adrenal glands: No nodule. Kidneys: Symmetric renal enhancement. No hydronephrosis. Right renal cyst measures 8.2 cm. Left renal cyst measures 3.6 cm. Additional small cortical hypodensity in the left kidney. Stomach/Bowel: Stomach physiologically distended. There is wall thickening involving the third and fourth portion of the duodenum and proximal jejunum. Mild adjacent mesenteric edema. More distal small bowel loops are decompressed. The appendix is normal. Distal colonic diverticulosis without acute diverticulitis.  Vascular/Lymphatic: Moderate diffuse atherosclerosis of the abdominal aorta. No aneurysm or dissection. Calcified plaque at the origin of the celiac artery causes mild luminal narrowing with minimal poststenotic dilatation. There is an accessory small right hepatic artery arising directly from the aorta. Atherosclerosis of the superior and inferior mesenteric arteries. No or abrupt arterial occlusion. Portal and mesenteric veins appear patent. Central mesenteric edema. No retroperitoneal adenopathy. Small mesenteric lymph nodes not enlarged by size criteria. Reproductive: Heterogeneous enlarged prostate gland. Bladder: Physiologically distended. Minimal perivesicular soft tissue stranding likely related to are enlarged prostate gland and chronic outlet obstruction. Other: No free air, free fluid, or intra-abdominal fluid collection. Musculoskeletal: There are no acute or suspicious osseous abnormalities. Review of the MIP images confirms the above findings. IMPRESSION: 1. Duodenal and proximal jejunal wall thickening with adjacent mesenteric edema, suggesting enteritis. Findings may be infectious or inflammatory. No focal vascular abnormality to suggest ischemia. 2. Diffuse atherosclerosis of the abdominal aorta and its branches. No acute vascular abnormality or aneurysm. No abrupt occlusion. 3. Chronic findings again seen include cholelithiasis, colonic diverticulosis, bilateral renal cysts and prostate gland enlargement. Electronically Signed   By: Jeb Levering M.D.   On: 08/14/2015 23:04    ____________________________________________   PROCEDURES  Procedure(s) performed:   Procedures   ____________________________________________   INITIAL IMPRESSION / ASSESSMENT AND PLAN / ED COURSE  Pertinent labs & imaging results that were available during my care of the patient were reviewed by me and considered in my medical decision making (see chart for details).  Initially the patient presented  very much like renal colic/ureteral stone.  However his urinalysis was unremarkable  and his CT scan without contrast did not demonstrate a stone.  There is no evidence of any perinephric edema but I was concerned about the possibility of an ischemic kidney versus ischemic bowel.  I called and spoke with the radiologist and he did comment and added an addendum regarding the possibility of some haziness of the mesentery in the area of the jejunum.  He agreed with my plan for additional workup and we decided upon a CT angiography of the abdomen/pelvis with the addition of oral contrast.  The patient did require an additional dose of morphine and continues to complain not of abdominal pain but of left flank pain.  Lab work was unremarkable except for a slightly elevated lactic acid of 2.2 of uncertain significance.  His vital signs remained stable and he has no leukocytosis.  When I received the results of the CT angiography I checked on him and he was sitting up in a chair, not in the exam bed, alert and oriented and still describes discomfort but is very healthy and well-appearing.  I explained to him about the enteritis and he said that he has been struggling with some persistent diarrhea issues for more than a month and working with his primary care doctor.  He thought that it had essentially resolved but now wonders if maybe it is back.  We discussed it and decided to hold off on antibiotics since he had been dealing with this for the long-term and he has not yet had any antibiotics and he has the ability to follow up closely as an outpatient.  Current plan is to give another 500 mL of normal saline and recheck a lactic acid to make sure it is trending down.  I discussed with him that he can go home for outpatient follow-up if he continues to feel okay with adequate analgesia due to the Percocet I ordered.  I signed out care to Dr. Dahlia Client to follow up and reassess if the patient changes clinically, but he is  eager to go home if possible.  I still do not have a solid explanation for his flank pain; it may be due to his enteritis, or less likely due to the single gallstone found (the radiologist said that his gallbladder looks very healthy and would be surprised if this was causing the left flank pain).  I gave the patient strict return precautions.   ____________________________________________  FINAL CLINICAL IMPRESSION(S) / ED DIAGNOSES  Final diagnoses:  Enteritis  Acute left flank pain  Cholelithiasis without cholecystitis     MEDICATIONS GIVEN DURING THIS VISIT:  Medications  morphine 4 MG/ML injection 4 mg (4 mg Intravenous Given 08/14/15 2055)  ondansetron (ZOFRAN) injection 4 mg (4 mg Intravenous Given 08/14/15 2055)  sodium chloride 0.9 % bolus 500 mL (0 mLs Intravenous Stopped 08/14/15 2207)  diatrizoate meglumine-sodium (GASTROGRAFIN) 66-10 % solution 15 mL (15 mLs Oral Given 08/14/15 2203)  iopamidol (ISOVUE-370) 76 % injection 100 mL (100 mLs Intravenous Contrast Given 08/14/15 2224)  morphine 4 MG/ML injection 4 mg (4 mg Intravenous Given 08/14/15 2244)  oxyCODONE-acetaminophen (PERCOCET/ROXICET) 5-325 MG per tablet 2 tablet (2 tablets Oral Given 08/15/15 0018)  sodium chloride 0.9 % bolus 500 mL (500 mLs Intravenous New Bag/Given 08/15/15 0018)     NEW OUTPATIENT MEDICATIONS STARTED DURING THIS VISIT:  New Prescriptions   No medications on file      Note:  This document was prepared using Dragon voice recognition software and may include unintentional dictation errors.  Hinda Kehr, MD 08/15/15 [phone]

## 2015-08-14 NOTE — ED Notes (Signed)
Pt in via triage with complaints of left flank pain x 1 day which radiates around hip and down into groin.  Pt reports nausea, denies any vomiting.  Pt denies any pain, urgency, frequency with urination.  Pt denies any hx of kidney stones.  Pt A/Ox4, no immediate distress at this time.

## 2015-08-15 DIAGNOSIS — K8020 Calculus of gallbladder without cholecystitis without obstruction: Secondary | ICD-10-CM | POA: Diagnosis not present

## 2015-08-15 LAB — LACTIC ACID, PLASMA: LACTIC ACID, VENOUS: 1.6 mmol/L (ref 0.5–1.9)

## 2015-08-15 NOTE — Discharge Instructions (Signed)
Fortunately your workup was reassuring today.  There was no evidence of kidney stones on your scan.  It does appear that you have a degree of enteritis, which is either an infectious or inflammatory process in your bowels.  Since you have had issues in your bowels before over the last month and your outpatient doctor is working with you on this ongoing issue, we recommend that rather than prescribing antibiotics for a year now (since all of your lab work is within normal limits) that you follow-up with your doctor at the next available appointment to discuss the enteritis and whether or not you would benefit from outpatient antibiotics.  Drink plenty of clear fluids to stay hydrated.  Remember that you should not take your metformin for the next 2 days.  Of note, you do have what appears to be a single gallstone in your gallbladder.  It is very unlikely that this is the cause of your current discomfort, but it is important for you to know in the future and you may want to discuss this result with your primary care doctor and consider going to see a surgeon to have her gallbladder removed if you have signs or symptoms of biliary colic (see the information included below).  Return to the emergency department if you develop new or worsening symptoms that concern you.   Flank Pain Flank pain refers to pain that is located on the side of the body between the upper abdomen and the back. The pain may occur over a short period of time (acute) or may be long-term or reoccurring (chronic). It may be mild or severe. Flank pain can be caused by many things. CAUSES  Some of the more common causes of flank pain include:  Muscle strains.   Muscle spasms.   A disease of your spine (vertebral disk disease).   A lung infection (pneumonia).   Fluid around your lungs (pulmonary edema).   A kidney infection.   Kidney stones.   A very painful skin rash caused by the chickenpox virus (shingles).    Gallbladder disease.  [place] care will depend on the cause of your pain. In general,  Rest as directed by your caregiver.  Drink enough fluids to keep your urine clear or pale yellow.  Only take over-the-counter or prescription medicines as directed by your caregiver. Some medicines may help relieve the pain.  Tell your caregiver about any changes in your pain.  Follow up with your caregiver as directed. SEEK IMMEDIATE MEDICAL CARE IF:   Your pain is not controlled with medicine.   You have new or worsening symptoms.  Your pain increases.   You have abdominal pain.   You have shortness of breath.   You have persistent nausea or vomiting.   You have swelling in your abdomen.   You feel faint or pass out.   You have blood in your urine.  You have a fever or persistent symptoms for more than 2-3 days.  You have a fever and your symptoms suddenly get worse. MAKE SURE YOU:   Understand these instructions.  Will watch your condition.  Will get help right away if you are not doing well or get worse.   This information is not intended to replace advice given to you by your health care provider. Make sure you discuss any questions you have with your health care provider.   Document Released: 03/20/2005 Document Revised: 10/22/2011 Document Reviewed: 09/11/2011 Elsevier Interactive Patient Education 2016  Elsevier Inc.  Colitis Colitis is inflammation of the colon. Colitis may last a short time (acute) or it may last a long time (chronic). CAUSES This condition may be caused by:  Viruses.  Bacteria.  Reactions to medicine.  Certain autoimmune diseases, such as Crohn disease or ulcerative colitis. SYMPTOMS Symptoms of this condition include:  Diarrhea.  Passing bloody or tarry stool.  Pain.  Fever.  Vomiting.  Tiredness (fatigue).  Weight loss.  Bloating.  Sudden increase in abdominal pain.  Having fewer bowel  movements than usual. DIAGNOSIS This condition is diagnosed with a stool test or a blood test. You may also have other tests, including X-rays, a CT scan, or a colonoscopy. TREATMENT Treatment may include:  Resting the bowel. This involves not eating or drinking for a period of time.  Fluids that are given through an IV tube.  Medicine for pain and diarrhea.  Antibiotic medicines.  Cortisone medicines.  Surgery. HOME CARE INSTRUCTIONS Eating and Drinking  Follow instructions from your health care provider about eating or drinking restrictions.  Drink enough fluid to keep your urine clear or pale yellow.  Work with a dietitian to determine which foods cause your condition to flare up.  Avoid foods that cause flare-ups.  Eat a well-balanced diet. Medicines  Take over-the-counter and prescription medicines only as told by your health care provider.  If you were prescribed an antibiotic medicine, take it as told by your health care provider. Do not stop taking the antibiotic even if you start to feel better. General Instructions  Keep all follow-up visits as told by your health care provider. This is important. SEEK MEDICAL CARE IF:  Your symptoms do not go away.  You develop new symptoms. SEEK IMMEDIATE MEDICAL CARE IF:  You have a fever that does not go away with treatment.  You develop chills.  You have extreme weakness, fainting, or dehydration.  You have repeated vomiting.  You develop severe pain in your abdomen.  You pass bloody or tarry stool.   This information is not intended to replace advice given to you by your health care provider. Make sure you discuss any questions you have with your health care provider.   Document Released: 03/06/2004 Document Revised: 10/18/2014 Document Reviewed: 05/22/2014 Elsevier Interactive Patient Education 2016 [employer].   Biliary Colic Biliary colic is a pain in the upper abdomen. The pain:  Is usually felt  on the right side of the abdomen, but it may also be felt in the center of the abdomen, just below the breastbone (sternum).  May spread back toward the right shoulder blade.  May be steady or irregular.  May be accompanied by nausea and vomiting. Most of the time, the pain goes away in 1-5 hours. After the most intense pain passes, the abdomen may continue to ache mildly for about 24 hours. Biliary colic is caused by a blockage in the bile duct. The bile duct is a pathway that carries bile--a liquid that helps to digest fats--from the gallbladder to the small intestine. Biliary colic usually occurs after eating, when the digestive system demands bile. The pain develops when muscle cells contract forcefully to try to move the blockage so that bile can get by. HOME CARE INSTRUCTIONS  Take medicines only as directed by your health care provider.  Drink enough fluid to keep your urine clear or pale yellow.  Avoid fatty, greasy, and fried foods. These kinds of foods increase your body's demand for bile.  Avoid any  foods that make your pain worse.  Avoid overeating.  Avoid having a large meal after fasting. SEEK MEDICAL CARE IF:  You develop a fever.  Your pain gets worse.  You vomit.  You develop nausea that prevents you from eating and drinking. SEEK IMMEDIATE MEDICAL CARE IF:  You suddenly develop a fever and shaking chills.  You develop a yellowish discoloration (jaundice) of:  Skin.  Whites of the eyes.  Mucous membranes.  You have continuous or severe pain that is not relieved with medicines.  You have nausea and vomiting that is not relieved with medicines.  You develop dizziness or you faint.   This information is not intended to replace advice given to you by your health care provider. Make sure you discuss any questions you have with your health care provider.   Document Released: 06/30/2005 Document Revised: 06/13/2014 Document Reviewed: 11/08/2013 Elsevier  Interactive Patient Education [employer].

## 2015-08-15 NOTE — ED Provider Notes (Signed)
-----------------------------------------   1:32 AM on 08/15/2015 -----------------------------------------   Blood pressure 172/74, pulse 64, temperature 97.8 F (36.6 C), temperature source Oral, resp. rate 15, height 6' (1.829 m), weight 180 lb (81.647 kg), SpO2 97 %.  Assuming care from Dr. Karma Greaser.  In short, Randy Frazier is a 82 y.o. male with a chief complaint of Flank Pain .  Refer to the original H&P for additional details.  The current plan of care is to follow up the lactic acid.  The repeat lactic acid is 1.6. He will be discharged to home.       Loney Hering, MD 08/15/15 [phone]

## 2015-08-16 DIAGNOSIS — L409 Psoriasis, unspecified: Secondary | ICD-10-CM | POA: Insufficient documentation

## 2015-08-16 DIAGNOSIS — Z951 Presence of aortocoronary bypass graft: Secondary | ICD-10-CM | POA: Insufficient documentation

## 2015-08-16 DIAGNOSIS — N503 Cyst of epididymis: Secondary | ICD-10-CM | POA: Insufficient documentation

## 2015-08-17 DIAGNOSIS — N401 Enlarged prostate with lower urinary tract symptoms: Secondary | ICD-10-CM

## 2015-08-17 DIAGNOSIS — E119 Type 2 diabetes mellitus without complications: Secondary | ICD-10-CM | POA: Insufficient documentation

## 2015-08-17 DIAGNOSIS — N138 Other obstructive and reflux uropathy: Secondary | ICD-10-CM | POA: Insufficient documentation

## 2015-08-22 ENCOUNTER — Ambulatory Visit: Payer: Medicare Other | Attending: Geriatric Medicine

## 2015-08-22 DIAGNOSIS — M545 Low back pain: Secondary | ICD-10-CM | POA: Insufficient documentation

## 2015-08-22 DIAGNOSIS — M6281 Muscle weakness (generalized): Secondary | ICD-10-CM | POA: Diagnosis present

## 2015-08-22 DIAGNOSIS — M25552 Pain in left hip: Secondary | ICD-10-CM | POA: Diagnosis present

## 2015-08-22 NOTE — Therapy (Signed)
[facility] [address] [place], [address] Phone: [phone]   Fax:  [phone]  Physical Therapy Evaluation And Discharge Summary  Patient Details  Name: Randy Frazier MRN: [id number] Date of Birth: 01-07-1928 Referring Provider: Johny Blamer, MD  Encounter Date: 08/22/2015      PT End of Session - 08/22/15 0806    Visit Number 1   Number of Visits 1   Date for PT Re-Evaluation 08/22/15   Authorization Type 1   Authorization Time Period of 10   PT Start Time 0806   PT Stop Time 0918   PT Time Calculation (min) 72 min   Activity Tolerance Patient tolerated treatment well   Behavior During Therapy [facility] for tasks assessed/performed      Past Medical History  Diagnosis Date  . Hypertension   . Hyperlipidemia   . Arrhythmia     A-Fib  . Coronary artery disease 2009    CABG X 3  @ [facility]  . GERD (gastroesophageal reflux disease)   . Enlarged prostate   . Renal disorder   . CVA (cerebral infarction) 2014  . Diabetes mellitus without complication ([place])   . BPH (benign prostatic hyperplasia)     Past Surgical History  Procedure Laterality Date  . Coronary artery bypass graft  2009    CABG X 3 @ [facility]  . Cataract surgery Bilateral     There were no vitals filed for this visit.      Subjective Assessment - 08/22/15 0807    Subjective Back pain: 5-6/10 current (pt sitting) and worst.    L groin pain 8/10 currently, L testicle is 10/10 currently   Pertinent History Pt states he went to the hospital thinking he had kidney stones but was negative for it. Went to [facility] and was diagnosed with a pinched nerve which radiated to his L hip and groin. Symptoms began last Tuesday. Pt was taking down an old wire fence that day. Denies bowel or bladder problems.    Patient Stated Goals Pt expresses desire to get better.    Currently in Pain? Yes   Pain Score 8   L groin   Pain Location --  low  back, L low back, L groin, L testicle   Pain Orientation Left   Pain Type Acute pain   Pain Onset 1 to 4 weeks ago   Aggravating Factors  Sleeping on his back, and on his side; picking up something from the floor   Pain Relieving Factors lying on his stomach, standing straight up, heating pad   Multiple Pain Sites Yes  low back, L low back, L groin            [facility] PT Assessment - 08/22/15 0825    Assessment   Medical Diagnosis L groin pain   Referring Provider Johny Blamer, MD   Onset Date/Surgical Date 08/14/15  Symptoms began last Tuesday   Prior Therapy No known PT for current condition.    Precautions   Precaution Comments No known precautions   Restrictions   Other Position/Activity Restrictions No known restrictions   Balance Screen   Has the patient fallen in the past 6 months No   Has the patient had a decrease in activity level because of a fear of falling?  No   Is the patient reluctant to leave their home because of a fear of falling?  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    Observation/Other Assessments   Observations Poison ivy observed throughout body, pt scratching. Repeated flexion test did not reproduce L groin pain. L LE longer in supine. Equal LE leg length in sitting. (+) long sit test L LE suggesting anterior nutation of L innominant.    Posture/Postural Control   Posture Comments Slight R lateral shift, bilaterally protracted shoulders and neck   AROM   Lumbar Flexion limited with bilateral posterior thigh tightness. Difficulty keeping knees straight   Lumbar Extension limited with low back discomfort   Lumbar - Right Side Bend limited with L low back stiffness and symptoms   Lumbar - Left Side Bend limited with increased L low back, posterior lateral hip, and L groin pain   Lumbar - Right Rotation limited   Lumbar - Left  Rotation limited with reproduction of L groin pain  Pt performed L rotation when trying to take fence off.    Strength   Right Hip Flexion 4/5   Right Hip Extension 4/5   Right Hip ABduction 4/5   Left Hip Flexion 4/5   Left Hip Extension 4+/5   Left Hip ABduction 4/5   Right Knee Flexion 4/5   Right Knee Extension 5/5   Left Knee Flexion 4-/5   Left Knee Extension 5/5   Palpation   Palpation comment Tenderness to palpation  L sacral ala, L L5, L4, L3, L2, L1, T12 transverse process with most reproduction of L groin symptoms at around L2, L1, T12 area.    Ambulation/Gait   Gait Comments Decreased stance L LE, decreased trunk rotation        Objectives  There-ex  Sitting with lumbar towel roll x 2 min. No change in symptoms Standing R trunk rotation 10x5 seconds  Supine L lower trunk rotation 10x5 seconds    Improved exercise technique, movement at target joints, use of target muscles after mod verbal, visual, tactile cues.    No change in symptoms                     PT Education - 08/22/15 1717    Education provided Yes   Education Details ther-ex   [employer]) Educated Patient   Methods Explanation;Demonstration;Tactile cues;Verbal cues   Comprehension Verbalized understanding;Returned demonstration                    Plan - 08/22/15 1718    Clinical Impression Statement Patient is an 82 year old male who came to physical therapy secondary to L low back, and L groin pain a few days ago. He also presents with reproduction of symptoms with L lumbar side bending and L lumbar rotation; tenderness to palpation to L lumbar transverse processes with most reproduction of L groin symptoms with posterior to anterior pressure to around the L L2, L1, and T12 transverse processes; positive special test suggesting lumbar and pelvic involvement; bilateral LE weakness, altered posture and gait pattern, and difficulty performing functional tasks such as  picking up items from the floor and tolerating positions such as supine and sidelying. Skilled physical therapy services not recommended secondary to patient not wanting to continue.    PT Treatment/Interventions Therapeutic exercise   Consulted and Agree with Plan of Care Patient      Patient will benefit from skilled therapeutic intervention in order to improve the  following deficits and impairments:     Visit Diagnosis: Pain in left hip - Plan: PT plan of care cert/re-cert  Left low back pain, with sciatica presence unspecified - Plan: PT plan of care cert/re-cert  Muscle weakness (generalized) - Plan: PT plan of care cert/re-cert       G-Codes - 16-Sep-2015 1939    Functional Assessment Tool Used Clinical presentation, patient interview   Functional Limitation Mobility: Walking and moving around   Mobility: Walking and Moving Around Current Status [phone]) At least 40 percent but less than 60 percent impaired, limited or restricted   Mobility: Walking and Moving Around Goal Status [phone]) At least 40 percent but less than 60 percent impaired, limited or restricted   Mobility: Walking and Moving Around Discharge Status [phone]) At least 40 percent but less than 60 percent impaired, limited or restricted      Problem List Patient Active Problem List   Diagnosis Date Noted  . Stroke ([place]) 08/18/2012  . Gout 08/18/2012  . Depression 07/31/2011  . Diabetes mellitus ([place]) 06/19/2010  . Hyperlipidemia 12/11/2009  . HYPERTENSION, BENIGN 12/11/2009  . CAD (coronary artery disease) 12/11/2009  . CAD, AUTOLOGOUS BYPASS GRAFT 12/11/2009   Thank you for your referral.  Joneen Boers PT, DPT   09/16/15, 7:52 PM  [facility] [address] [place], [address] Phone: [phone]   Fax:  [phone]  Name: Randy Frazier MRN: [id number] Date of Birth: [birth year]

## 2016-01-01 ENCOUNTER — Other Ambulatory Visit: Payer: Self-pay | Admitting: Cardiovascular Disease

## 2016-02-22 ENCOUNTER — Ambulatory Visit (INDEPENDENT_AMBULATORY_CARE_PROVIDER_SITE_OTHER): Payer: Medicare Other | Admitting: Cardiovascular Disease

## 2016-02-22 ENCOUNTER — Encounter: Payer: Self-pay | Admitting: Cardiovascular Disease

## 2016-02-22 VITALS — BP 130/82 | HR 64 | Ht 71.0 in | Wt 189.2 lb

## 2016-02-22 DIAGNOSIS — E782 Mixed hyperlipidemia: Secondary | ICD-10-CM

## 2016-02-22 DIAGNOSIS — I2510 Atherosclerotic heart disease of native coronary artery without angina pectoris: Secondary | ICD-10-CM

## 2016-02-22 DIAGNOSIS — I10 Essential (primary) hypertension: Secondary | ICD-10-CM

## 2016-02-22 DIAGNOSIS — Z794 Long term (current) use of insulin: Secondary | ICD-10-CM

## 2016-02-22 DIAGNOSIS — E1159 Type 2 diabetes mellitus with other circulatory complications: Secondary | ICD-10-CM

## 2016-02-22 DIAGNOSIS — I639 Cerebral infarction, unspecified: Secondary | ICD-10-CM

## 2016-02-22 NOTE — Progress Notes (Signed)
Cardiology Office Note  Date:  02/22/2016   ID:  Randy Frazier, Randy Frazier [birth year], MRN [id number]  PCP:  Valera Castle, MD   Chief Complaint  Patient presents with  . other    6 month follow up. Meds reviewed by the pt. verbally. "doing well."     HPI:  Randy Frazier is a very pleasant 83 year-old gentleman with a history of coronary artery disease, bypass surgery in 2009 at [facility], poorly controlled diabetes, history of hyperlipidemia and hypertension who presents for routine followup of his coronary artery disease He has a history of stroke  Currently taking insulin for diabetes, managed by endocrinology Reports he is trying to do better on his diet Feels that he is "starving" Glu 230 today  Hemoglobin A1c  7.12 July 2015  No recent lipid  Denies any chest pain concerning for angina  Previously stopped his fluoxetine on his own abruptly, did not have side effects Does not feel that he is depressed Reports that he has a "lady friend"  He is having bladder control issues, some incontinence Has a prostate pill, stopped on his own   total cholesterol 153, LDL 73 in 2016 He does not want to increase his Lipitor as his cramping gets worse  EKG on today's visit shows normal sinus rhythm with rate 70 bpm, right bundle branch block, left axis deviation, PVC noted  Other past medical history Prior stroke stroke he had word finding difficulty. He had depression after the loss of his wife.   Seen in the hospital emergency room 03/30/2013 for chest pain. Workup was essentially normal, negative cardiac enzymes, creatinine 1.54 with BUN of 20, normal LFTs. EKG was unchanged with right bundle branch block  Hemoglobin A1c 7.3, total cholesterol 127, HDL 37, LDL 60  [place] speech. He was admitted to a hospital there and had full workup including carotid ultrasound, head CT, MRI and echocardiogram. Presumably  he was placed on a telemetry monitor during his hospital admission. Cardiac CT and MRI showed left posterior temporal lobe stroke.  Carotid ultrasound showed mild plaquing bilaterally Echocardiogram ejection fraction 60-65%, mildly dilated left atrium, otherwise normal study  Cardiac catheterization notes from August 2009 shows a proximal left main lesion of 40%, proximal LAD lesion 60%, mid LAD lesion 95%, distal LAD lesion 70%, proximal circumflex and 60% followed by a 90% lesion with 99% proximal RCA disease, 70% distal RCA disease, 100% stenosis of the PDA vessel. He was transferred to [facility] for bypass surgery previous records are not available to [place] at this time.   Echocardiogram from December 2009 shows mild mitral regurgitation, mild pulmonic regurgitation, trace TR, otherwise normal LV size and systolic function with mild LVH and mild diastolic dysfunction.  PMH:   has a past medical history of Arrhythmia; BPH (benign prostatic hyperplasia); Coronary artery disease (2009); CVA (cerebral infarction) (2014); Diabetes mellitus without complication ([place]); Enlarged prostate; GERD (gastroesophageal reflux disease); Hyperlipidemia; Hypertension; and Renal disorder.  PSH:    Past Surgical History:  Procedure Laterality Date  . cataract surgery Bilateral   . CORONARY ARTERY BYPASS GRAFT  2009   CABG X 3 @ [facility]    Current Outpatient Prescriptions  Medication Sig Dispense Refill  . amLODipine (NORVASC) 10 MG tablet TAKE 1 TABLET BY MOUTH EVERY DAY 30 tablet 6  . aspirin EC 81 MG tablet Take 81 mg by mouth.    [name] atorvastatin (LIPITOR) 10 MG  tablet TAKE 1 TABLET BY MOUTH EVERY DAY 30 tablet 0  . clopidogrel (PLAVIX) 75 MG tablet Take 75 mg by mouth daily.    . Fluoxetine HCl, PMDD, 20 MG CAPS Take 20 mg by mouth daily.    [name] gabapentin (NEURONTIN) 300 MG capsule Take 300 mg by mouth 3 (three) times daily.     . insulin glargine (LANTUS) 100 UNIT/ML injection Inject 30 Units into the  skin at bedtime.     [name] lisinopril (PRINIVIL,ZESTRIL) 20 MG tablet Take 20 mg by mouth daily.     . Melatonin 500 MCG TBDP Take 500 mcg by mouth at bedtime.    . metFORMIN (GLUCOPHAGE) 500 MG tablet Take 1,000 mg by mouth 2 (two) times daily with a meal.     . metoprolol tartrate (LOPRESSOR) 25 MG tablet Take 25 mg by mouth 2 (two) times daily.      [name] omeprazole (PRILOSEC) 40 MG capsule Take 40 mg by mouth daily.       No current facility-administered medications for this visit.      Allergies:   Penicillins   Social History:  The patient  reports that he has never smoked. He has never used smokeless tobacco. He reports that he does not drink alcohol or use drugs.   Family History:   Family history is unknown by patient.    Review of Systems: Review of Systems  Constitutional: Negative.        Hungry, food cravings  Respiratory: Negative.   Cardiovascular: Negative.   Gastrointestinal: Negative.   Musculoskeletal: Negative.   Neurological: Negative.   Psychiatric/Behavioral: Negative.   All other systems reviewed and are negative.    PHYSICAL EXAM: VS:  BP 130/82 (BP Location: Left Arm, Patient Position: Sitting, Cuff Size: Normal)   Pulse 64   Ht 5\' 11"  (1.803 m)   Wt 189 lb 4 oz (85.8 kg)   BMI 26.40 kg/m  , BMI Body mass index is 26.4 kg/m. GEN: Well nourished, well developed, in no acute distress  HEENT: normal  Neck: no JVD, carotid bruits, or masses Cardiac: RRR; no murmurs, rubs, or gallops,no edema  Respiratory:  clear to auscultation bilaterally, normal work of breathing GI: soft, nontender, nondistended, + BS MS: no deformity or atrophy  Skin: warm and dry, no rash Neuro:  Strength and sensation are intact Psych: euthymic mood, full affect  Recent Labs: 08/14/2015: ALT 23; BUN 15; Creatinine, Ser 1.11; Hemoglobin 14.6; Platelets 291; Potassium 3.9; Sodium 136    Lipid Panel Lab Results  Component Value Date   CHOL 116 02/23/2012   HDL 32 (L)  02/23/2012   LDLCALC 64 02/23/2012   TRIG 99 02/23/2012      Wt Readings from Last 3 Encounters:  02/22/16 189 lb 4 oz (85.8 kg)  08/14/15 180 lb (81.6 kg)  06/17/15 180 lb (81.6 kg)       ASSESSMENT AND PLAN:  HYPERTENSION, BENIGN - Plan: EKG 12-Lead Blood pressure is well controlled on today's visit. No changes made to the medications.  Coronary artery disease involving native coronary artery of native heart without angina pectoris - Plan: EKG 12-Lead Currently with no symptoms of angina. No further workup at this time. Continue current medication regimen.  Mixed hyperlipidemia Recommended he have fasting labs done through primary care In the past did not want higher dose statin secondary to muscle cramping Goal LDL less than 70  Type 2 diabetes mellitus with other circulatory complication, with long-term current use of  insulin ([place]va[place]) Long discussion with him concerning his diet, his food cravings Suggested he try to increase his protein Encouraged him to follow low carbohydrate diet Stressed the importance of Compliance with his insulin Recent 9 pound weight gain over the past year  Prior stroke Encouraged him to stay on his aspirin, aggressive lipid management   Total encounter time more than 25 minutes  Greater than 50% was spent in counseling and coordination of care with the patient   Disposition:   F/U  6 months   Orders Placed This Encounter  Procedures  . EKG 12-Lead     Signed, Esmond Plants, M.D., Ph.D. 02/22/2016  [place], [place]

## 2016-02-22 NOTE — Patient Instructions (Signed)

## 2016-04-28 ENCOUNTER — Other Ambulatory Visit: Payer: Self-pay | Admitting: Cardiovascular Disease

## 2016-04-29 ENCOUNTER — Telehealth: Payer: Self-pay | Admitting: Cardiovascular Disease

## 2016-04-29 NOTE — Telephone Encounter (Signed)
Received cardiac clearance request for pt to proceed w/ dilation of lacrimal punctum - right - under IV sedation on 05/07/16.  This will be done on an outpatient basis at [facility]. Dr. Georjean Mode is requesting that pt hold aspirin & Plavix x 7 days prior to procedure. Please route clearance and recommendation to Nobie Putnam, RN @ [phone].

## 2016-04-30 NOTE — Telephone Encounter (Signed)
Acceptable risk to stop the Plavix 5 days prior to the procedure Would stay on low-dose aspirin given history of coronary disease bypass surgery and stroke Patient can call eye Dr. and confirm this is okay

## 2016-05-01 ENCOUNTER — Telehealth: Payer: Self-pay | Admitting: Cardiovascular Disease

## 2016-05-01 MED ORDER — CLOPIDOGREL BISULFATE 75 MG PO TABS
75.0000 mg | ORAL_TABLET | Freq: Every day | ORAL | 3 refills | Status: DC
Start: 2016-05-01 — End: 2016-05-08

## 2016-05-01 NOTE — Telephone Encounter (Signed)
Refill sent to [facility].  

## 2016-05-01 NOTE — Telephone Encounter (Signed)
Routed to fax # provided. 

## 2016-05-01 NOTE — Telephone Encounter (Signed)
°*  STAT* If patient is at the pharmacy, call can be transferred to refill team.   1. Which medications need to be refilled? (please list name of each medication and dose if known) Plavix 75 mg po daily   2. Which pharmacy/location (including street and city if local pharmacy) is medication to be sent to? [facility]   3. Do they need a 30 day or 90 day supply? [place]

## 2016-05-07 ENCOUNTER — Emergency Department
Admission: EM | Admit: 2016-05-07 | Discharge: 2016-05-07 | Disposition: A | Discharge status: Home / Self Care | Payer: Medicare Other | Attending: Emergency Medicine | Admitting: Emergency Medicine

## 2016-05-07 ENCOUNTER — Encounter (HOSPITAL_COMMUNITY): Admission: EM | Disposition: A | Discharge status: Home / Self Care | Payer: Self-pay | Source: Home / Self Care

## 2016-05-07 ENCOUNTER — Emergency Department (HOSPITAL_COMMUNITY): Payer: Medicare Other | Admitting: Certified Registered Nurse Anesthetist

## 2016-05-07 ENCOUNTER — Observation Stay (HOSPITAL_COMMUNITY)
Admission: EM | Admit: 2016-05-07 | Discharge: 2016-05-08 | Disposition: A | Discharge status: Home / Self Care | Payer: Medicare Other | Attending: Oculoplastics Ophthalmology | Admitting: Oculoplastics Ophthalmology

## 2016-05-07 ENCOUNTER — Encounter (HOSPITAL_COMMUNITY): Payer: Self-pay | Admitting: Emergency Medicine

## 2016-05-07 ENCOUNTER — Encounter: Payer: Self-pay | Admitting: Emergency Medicine

## 2016-05-07 DIAGNOSIS — I4891 Unspecified atrial fibrillation: Secondary | ICD-10-CM | POA: Insufficient documentation

## 2016-05-07 DIAGNOSIS — I2510 Atherosclerotic heart disease of native coronary artery without angina pectoris: Secondary | ICD-10-CM | POA: Insufficient documentation

## 2016-05-07 DIAGNOSIS — I10 Essential (primary) hypertension: Secondary | ICD-10-CM | POA: Diagnosis not present

## 2016-05-07 DIAGNOSIS — T148XXA Other injury of unspecified body region, initial encounter: Secondary | ICD-10-CM

## 2016-05-07 DIAGNOSIS — S0591XA Unspecified injury of right eye and orbit, initial encounter: Secondary | ICD-10-CM | POA: Diagnosis present

## 2016-05-07 DIAGNOSIS — S0511XA Contusion of eyeball and orbital tissues, right eye, initial encounter: Secondary | ICD-10-CM | POA: Diagnosis not present

## 2016-05-07 DIAGNOSIS — X58XXXA Exposure to other specified factors, initial encounter: Secondary | ICD-10-CM | POA: Insufficient documentation

## 2016-05-07 DIAGNOSIS — H59341 Postprocedural hematoma of right eye and adnexa following other procedure: Principal | ICD-10-CM | POA: Insufficient documentation

## 2016-05-07 DIAGNOSIS — E119 Type 2 diabetes mellitus without complications: Secondary | ICD-10-CM | POA: Insufficient documentation

## 2016-05-07 DIAGNOSIS — N401 Enlarged prostate with lower urinary tract symptoms: Secondary | ICD-10-CM | POA: Insufficient documentation

## 2016-05-07 DIAGNOSIS — N138 Other obstructive and reflux uropathy: Secondary | ICD-10-CM | POA: Insufficient documentation

## 2016-05-07 DIAGNOSIS — Y848 Other medical procedures as the cause of abnormal reaction of the patient, or of later complication, without mention of misadventure at the time of the procedure: Secondary | ICD-10-CM | POA: Diagnosis not present

## 2016-05-07 DIAGNOSIS — Z7902 Long term (current) use of antithrombotics/antiplatelets: Secondary | ICD-10-CM | POA: Insufficient documentation

## 2016-05-07 DIAGNOSIS — Y929 Unspecified place or not applicable: Secondary | ICD-10-CM | POA: Diagnosis not present

## 2016-05-07 DIAGNOSIS — Z880 Allergy status to penicillin: Secondary | ICD-10-CM | POA: Diagnosis not present

## 2016-05-07 DIAGNOSIS — Z951 Presence of aortocoronary bypass graft: Secondary | ICD-10-CM | POA: Insufficient documentation

## 2016-05-07 DIAGNOSIS — Z8673 Personal history of transient ischemic attack (TIA), and cerebral infarction without residual deficits: Secondary | ICD-10-CM | POA: Diagnosis not present

## 2016-05-07 DIAGNOSIS — K219 Gastro-esophageal reflux disease without esophagitis: Secondary | ICD-10-CM | POA: Insufficient documentation

## 2016-05-07 DIAGNOSIS — Z7982 Long term (current) use of aspirin: Secondary | ICD-10-CM | POA: Insufficient documentation

## 2016-05-07 DIAGNOSIS — E785 Hyperlipidemia, unspecified: Secondary | ICD-10-CM | POA: Diagnosis not present

## 2016-05-07 DIAGNOSIS — N400 Enlarged prostate without lower urinary tract symptoms: Secondary | ICD-10-CM | POA: Insufficient documentation

## 2016-05-07 DIAGNOSIS — Z7984 Long term (current) use of oral hypoglycemic drugs: Secondary | ICD-10-CM | POA: Diagnosis not present

## 2016-05-07 DIAGNOSIS — Y939 Activity, unspecified: Secondary | ICD-10-CM | POA: Insufficient documentation

## 2016-05-07 DIAGNOSIS — H05239 Hemorrhage of unspecified orbit: Secondary | ICD-10-CM | POA: Diagnosis present

## 2016-05-07 DIAGNOSIS — J329 Chronic sinusitis, unspecified: Secondary | ICD-10-CM | POA: Insufficient documentation

## 2016-05-07 DIAGNOSIS — N179 Acute kidney failure, unspecified: Secondary | ICD-10-CM | POA: Insufficient documentation

## 2016-05-07 DIAGNOSIS — Y999 Unspecified external cause status: Secondary | ICD-10-CM | POA: Insufficient documentation

## 2016-05-07 DIAGNOSIS — Z79899 Other long term (current) drug therapy: Secondary | ICD-10-CM | POA: Insufficient documentation

## 2016-05-07 HISTORY — PX: TEAR DUCT PROBING: SHX793

## 2016-05-07 LAB — CBC WITH DIFFERENTIAL/PLATELET
BASOS PCT: 1 %
Basophils Absolute: 0 10*3/uL (ref 0.0–0.1)
Eosinophils Absolute: 0.2 10*3/uL (ref 0.0–0.7)
Eosinophils Relative: 4 %
HEMATOCRIT: 40.6 % (ref 39.0–52.0)
HEMOGLOBIN: 13.4 g/dL (ref 13.0–17.0)
LYMPHS ABS: 1.1 10*3/uL (ref 0.7–4.0)
LYMPHS PCT: 19 %
MCH: 30 pg (ref 26.0–34.0)
MCHC: 33 g/dL (ref 30.0–36.0)
MCV: 91 fL (ref 78.0–100.0)
MONOS PCT: 11 %
Monocytes Absolute: 0.7 10*3/uL (ref 0.1–1.0)
NEUTROS ABS: 3.9 10*3/uL (ref 1.7–7.7)
NEUTROS PCT: 65 %
Platelets: 262 10*3/uL (ref 150–400)
RBC: 4.46 MIL/uL (ref 4.22–5.81)
RDW: 16 % — ABNORMAL HIGH (ref 11.5–15.5)
WBC: 5.9 10*3/uL (ref 4.0–10.5)

## 2016-05-07 LAB — COMPREHENSIVE METABOLIC PANEL
ALBUMIN: 3.8 g/dL (ref 3.5–5.0)
ALK PHOS: 58 U/L (ref 38–126)
ALT: 40 U/L (ref 17–63)
ANION GAP: 10 (ref 5–15)
AST: 40 U/L (ref 15–41)
BILIRUBIN TOTAL: 0.6 mg/dL (ref 0.3–1.2)
BUN: 17 mg/dL (ref 6–20)
CALCIUM: 8.8 mg/dL — AB (ref 8.9–10.3)
CO2: 24 mmol/L (ref 22–32)
Chloride: 102 mmol/L (ref 101–111)
Creatinine, Ser: 1.34 mg/dL — ABNORMAL HIGH (ref 0.61–1.24)
GFR calc Af Amer: 54 mL/min — ABNORMAL LOW (ref >60)
GFR calc non Af Amer: 46 mL/min — ABNORMAL LOW (ref >60)
GLUCOSE: 208 mg/dL — AB (ref 65–99)
Potassium: 4.2 mmol/L (ref 3.5–5.1)
Sodium: 136 mmol/L (ref 135–145)
TOTAL PROTEIN: 7 g/dL (ref 6.5–8.1)

## 2016-05-07 LAB — PROTIME-INR
INR: 1.09
Prothrombin Time: 14.1 seconds (ref 11.4–15.2)

## 2016-05-07 SURGERY — PROBING, LACRIMAL DUCT, WITH IRRIGATION
Anesthesia: General | Site: Face | Laterality: Right

## 2016-05-07 MED ORDER — LIDOCAINE 2% (20 MG/ML) 5 ML SYRINGE
INTRAMUSCULAR | Status: DC | PRN
  Administered 2016-05-07: 60 mg via INTRAVENOUS

## 2016-05-07 MED ORDER — BUPIVACAINE HCL (PF) 0.25 % IJ SOLN
INTRAMUSCULAR | Status: AC
  Filled 2016-05-07: qty 30

## 2016-05-07 MED ORDER — SODIUM CHLORIDE 0.9 % IV SOLN: INTRAVENOUS | Status: DC

## 2016-05-07 MED ORDER — BSS IO SOLN
INTRAOCULAR | Status: AC
  Filled 2016-05-07: qty 15

## 2016-05-07 MED ORDER — MELATONIN 500 MCG PO TBDP: 500.0000 ug | ORAL_TABLET | Freq: Every day | ORAL | Status: DC

## 2016-05-07 MED ORDER — PANTOPRAZOLE SODIUM 40 MG PO TBEC: 40.0000 mg | DELAYED_RELEASE_TABLET | Freq: Every day | ORAL | Status: DC

## 2016-05-07 MED ORDER — LACTATED RINGERS IV SOLN
INTRAVENOUS | Status: DC | PRN
  Administered 2016-05-07: 21:00:00 via INTRAVENOUS

## 2016-05-07 MED ORDER — BACITRACIN ZINC 500 UNIT/GM EX OINT
TOPICAL_OINTMENT | CUTANEOUS | Status: AC
  Filled 2016-05-07: qty 28.35

## 2016-05-07 MED ORDER — PROPOFOL 10 MG/ML IV BOLUS
INTRAVENOUS | Status: DC | PRN
  Administered 2016-05-07: 150 mg via INTRAVENOUS

## 2016-05-07 MED ORDER — LIDOCAINE-EPINEPHRINE 2 %-1:100000 IJ SOLN
INTRAMUSCULAR | Status: AC
  Filled 2016-05-07: qty 1

## 2016-05-07 MED ORDER — BACITRACIN ZINC 500 UNIT/GM EX OINT
TOPICAL_OINTMENT | CUTANEOUS | Status: DC | PRN
  Administered 2016-05-07: 1 via TOPICAL

## 2016-05-07 MED ORDER — FLUOXETINE HCL 20 MG PO CAPS: 20.0000 mg | ORAL_CAPSULE | Freq: Every day | ORAL | Status: DC

## 2016-05-07 MED ORDER — PHENYLEPHRINE 40 MCG/ML (10ML) SYRINGE FOR IV PUSH (FOR BLOOD PRESSURE SUPPORT)
PREFILLED_SYRINGE | INTRAVENOUS | Status: DC | PRN
  Administered 2016-05-07: 120 ug via INTRAVENOUS
  Administered 2016-05-07: 80 ug via INTRAVENOUS

## 2016-05-07 MED ORDER — OXYCODONE-ACETAMINOPHEN 5-325 MG PO TABS
1.0000 | ORAL_TABLET | Freq: Once | ORAL | Status: AC
  Administered 2016-05-07: 1 via ORAL
  Filled 2016-05-07: qty 1

## 2016-05-07 MED ORDER — LIDOCAINE-EPINEPHRINE 2 %-1:100000 IJ SOLN
INTRAMUSCULAR | Status: DC | PRN
  Administered 2016-05-07: 1.5 mL

## 2016-05-07 MED ORDER — ONDANSETRON HCL 4 MG/2ML IJ SOLN
INTRAMUSCULAR | Status: AC
  Filled 2016-05-07: qty 8

## 2016-05-07 MED ORDER — GABAPENTIN 300 MG PO CAPS: 300.0000 mg | ORAL_CAPSULE | Freq: Three times a day (TID) | ORAL | Status: DC

## 2016-05-07 MED ORDER — SUGAMMADEX SODIUM 200 MG/2ML IV SOLN
INTRAVENOUS | Status: AC
  Filled 2016-05-07: qty 2

## 2016-05-07 MED ORDER — EPHEDRINE SULFATE-NACL 50-0.9 MG/10ML-% IV SOSY
PREFILLED_SYRINGE | INTRAVENOUS | Status: DC | PRN
  Administered 2016-05-07: 10 mg via INTRAVENOUS

## 2016-05-07 MED ORDER — INSULIN GLARGINE 100 UNIT/ML ~~LOC~~ SOLN
30.0000 [IU] | Freq: Every day | SUBCUTANEOUS | Status: DC
  Filled 2016-05-07: qty 0.3

## 2016-05-07 MED ORDER — ONDANSETRON 4 MG PO TBDP
4.0000 mg | ORAL_TABLET | Freq: Once | ORAL | Status: AC
  Administered 2016-05-07: 4 mg via ORAL
  Filled 2016-05-07: qty 1

## 2016-05-07 MED ORDER — FENTANYL CITRATE (PF) 250 MCG/5ML IJ SOLN
INTRAMUSCULAR | Status: AC
  Filled 2016-05-07: qty 5

## 2016-05-07 MED ORDER — CEFAZOLIN SODIUM 1 G IJ SOLR
INTRAMUSCULAR | Status: DC | PRN
  Administered 2016-05-07: 1 g via INTRAMUSCULAR

## 2016-05-07 MED ORDER — METOPROLOL TARTRATE 25 MG PO TABS: 25.0000 mg | ORAL_TABLET | Freq: Two times a day (BID) | ORAL | Status: DC

## 2016-05-07 MED ORDER — LISINOPRIL 20 MG PO TABS: 20.0000 mg | ORAL_TABLET | Freq: Every day | ORAL | Status: DC

## 2016-05-07 MED ORDER — ONDANSETRON HCL 4 MG/2ML IJ SOLN
INTRAMUSCULAR | Status: DC | PRN
  Administered 2016-05-07: 4 mg via INTRAVENOUS

## 2016-05-07 MED ORDER — CEFAZOLIN SODIUM 1 G IJ SOLR
INTRAMUSCULAR | Status: AC
  Filled 2016-05-07: qty 30

## 2016-05-07 MED ORDER — ONDANSETRON 4 MG PO TBDP
ORAL_TABLET | ORAL | Status: AC
  Filled 2016-05-07: qty 1

## 2016-05-07 MED ORDER — FENTANYL CITRATE (PF) 250 MCG/5ML IJ SOLN
INTRAMUSCULAR | Status: DC | PRN
  Administered 2016-05-07: 50 ug via INTRAVENOUS

## 2016-05-07 MED ORDER — FENTANYL CITRATE (PF) 100 MCG/2ML IJ SOLN: 25.0000 ug | INTRAMUSCULAR | Status: DC | PRN

## 2016-05-07 MED ORDER — BSS IO SOLN
INTRAOCULAR | Status: DC | PRN
  Administered 2016-05-07: 15 mL via INTRAOCULAR

## 2016-05-07 MED ORDER — METFORMIN HCL 500 MG PO TABS
1000.0000 mg | ORAL_TABLET | Freq: Two times a day (BID) | ORAL | Status: DC
  Administered 2016-05-08: 1000 mg via ORAL
  Filled 2016-05-07: qty 2

## 2016-05-07 MED ORDER — AMLODIPINE BESYLATE 10 MG PO TABS: 10.0000 mg | ORAL_TABLET | Freq: Every day | ORAL | Status: DC

## 2016-05-07 MED ORDER — BUPIVACAINE HCL (PF) 0.25 % IJ SOLN
INTRAMUSCULAR | Status: DC | PRN
  Administered 2016-05-07: 1.5 mL

## 2016-05-07 MED ORDER — ATORVASTATIN CALCIUM 10 MG PO TABS: 10.0000 mg | ORAL_TABLET | Freq: Every day | ORAL | Status: DC

## 2016-05-07 MED ORDER — PROMETHAZINE HCL 25 MG/ML IJ SOLN: 6.2500 mg | INTRAMUSCULAR | Status: DC | PRN

## 2016-05-07 MED ORDER — SUGAMMADEX SODIUM 200 MG/2ML IV SOLN
INTRAVENOUS | Status: DC | PRN
Start: 2016-05-07 — End: 2016-05-07
  Administered 2016-05-07: 200 mg via INTRAVENOUS

## 2016-05-07 MED ORDER — ROCURONIUM BROMIDE 10 MG/ML (PF) SYRINGE
PREFILLED_SYRINGE | INTRAVENOUS | Status: DC | PRN
  Administered 2016-05-07: 40 mg via INTRAVENOUS

## 2016-05-07 MED ORDER — ACETAMINOPHEN-CODEINE #3 300-30 MG PO TABS: 1.0000 | ORAL_TABLET | ORAL | Status: DC | PRN

## 2016-05-07 SURGICAL SUPPLY — 31 items
APL SRG 3 HI ABS STRL LF PLS (MISCELLANEOUS) ×2
APPLICATOR COTTON TIP 6IN STRL (MISCELLANEOUS) ×5 IMPLANT
APPLICATOR DR MATTHEWS STRL (MISCELLANEOUS) ×6 IMPLANT
BANDAGE EYE OVAL (MISCELLANEOUS) ×3 IMPLANT
BLADE SURG 15 STRL LF DISP TIS (BLADE) ×1 IMPLANT
BLADE SURG 15 STRL SS (BLADE) ×3
CORDS BIPOLAR (ELECTRODE) ×3 IMPLANT
COVER SURGICAL LIGHT HANDLE (MISCELLANEOUS) ×3 IMPLANT
DRAPE U-SHAPE 76X120 STRL (DRAPES) ×3 IMPLANT
FORCEPS BIPOLAR SPETZLER 8 1.0 (NEUROSURGERY SUPPLIES) ×3 IMPLANT
GAUZE SPONGE 4X4 16PLY XRAY LF (GAUZE/BANDAGES/DRESSINGS) ×3 IMPLANT
GLOVE BIO SURGEON STRL SZ7.5 (GLOVE) ×3 IMPLANT
GOWN STRL REUS W/ TWL LRG LVL3 (GOWN DISPOSABLE) ×2 IMPLANT
GOWN STRL REUS W/TWL LRG LVL3 (GOWN DISPOSABLE) ×6
KIT BASIN OR (CUSTOM PROCEDURE TRAY) ×3 IMPLANT
KIT ROOM TURNOVER OR (KITS) ×3 IMPLANT
MATRIX HEMOSTAT SURGIFLO (HEMOSTASIS) ×2 IMPLANT
NDL HYPO 30X.5 LL (NEEDLE) ×1 IMPLANT
NEEDLE HYPO 30X.5 LL (NEEDLE) ×3 IMPLANT
NS IRRIG 1000ML POUR BTL (IV SOLUTION) ×3 IMPLANT
PACK SURGICAL SETUP 50X90 (CUSTOM PROCEDURE TRAY) ×3 IMPLANT
PAD ARMBOARD 7.5X6 YLW CONV (MISCELLANEOUS) ×2 IMPLANT
SHIELD EYE LENSE ONLY DISP (MISCELLANEOUS) IMPLANT
SUT ETHILON 6 0 P 1 (SUTURE) ×7 IMPLANT
SUT SILK 4 0 SH CR/8 (SUTURE) ×2 IMPLANT
SUT VIC AB 5-0 RB1 27 (SUTURE) ×2 IMPLANT
SYR BULB 3OZ (MISCELLANEOUS) ×3 IMPLANT
SYR CONTROL 10ML LL (SYRINGE) ×3 IMPLANT
TOWEL OR 17X24 6PK STRL BLUE (TOWEL DISPOSABLE) ×4 IMPLANT
TUBE CONNECTING 12'X1/4 (SUCTIONS) ×1
TUBE CONNECTING 12X1/4 (SUCTIONS) ×2 IMPLANT

## 2016-05-07 NOTE — Anesthesia Procedure Notes (Signed)
Procedure Name: Intubation Date/Time: 05/07/2016 9:37 PM Performed by: Myna Bright Pre-anesthesia Checklist: Patient identified, Emergency Drugs available, Suction available and Patient being monitored Patient Re-evaluated:Patient Re-evaluated prior to inductionOxygen Delivery Method: Circle system utilized Preoxygenation: Pre-oxygenation with 100% oxygen Intubation Type: IV induction Ventilation: Mask ventilation without difficulty Laryngoscope Size: Mac and 4 Grade View: Grade I Tube type: Oral Tube size: 7.5 mm Number of attempts: 1 Airway Equipment and Method: Stylet Placement Confirmation: ETT inserted through vocal cords under direct vision,  positive ETCO2 and breath sounds checked- equal and bilateral Secured at: 22 cm Tube secured with: Tape Dental Injury: Teeth and Oropharynx as per pre-operative assessment

## 2016-05-07 NOTE — H&P (Signed)
Randy Frazier is an 83 y.o. male.   Chief Complaint: Right Periorbital pain. HPI:  S/p surgery for mass removal this am. Pt went home and eye became swollen and extremely painful and he developed a mass over his right eye and his eye is black and blue. Significantly the patient is on Plavix and stopped it 7 days before surgery. The bleeding was attempted to be stopped in the outpatient center but was unable to be stopped and the patient came to the ER per MD(Tylyn Derwin) advice for surgery.   Past Medical History:  Diagnosis Date  . Arrhythmia    A-Fib  . BPH (benign prostatic hyperplasia)   . Coronary artery disease 2009   CABG X 3  @ [facility]  . CVA (cerebral infarction) 2014  . Diabetes mellitus without complication ([place])   . Enlarged prostate   . GERD (gastroesophageal reflux disease)   . Hyperlipidemia   . Hypertension   . Renal disorder     Past Surgical History:  Procedure Laterality Date  . cataract surgery Bilateral   . CORONARY ARTERY BYPASS GRAFT  2009   CABG X 3 @ [facility]    Family History  Problem Relation Age of Onset  . Family history unknown: Yes   Social History:  reports that he has never smoked. He has never used smokeless tobacco. He reports that he does not drink alcohol or use drugs.  Allergies:  Allergies  Allergen Reactions  . Penicillins      (Not in a hospital admission)  No results found for this or any previous visit (from the past 48 hour(s)). No results found.  Review of Systems  Constitutional: Negative.   HENT: Negative.   Eyes: Positive for blurred vision and pain.  Respiratory: Negative.   Cardiovascular: Negative.   Gastrointestinal: Negative.   Genitourinary: Negative.   Musculoskeletal: Negative.   Neurological: Negative.   Endo/Heme/Allergies: Bruises/bleeds easily.  Psychiatric/Behavioral: Negative.    Other than what is mention in the HPI, nothing is positive  Blood pressure (!) 162/68, pulse 73, temperature 98.3 F (36.8  C), temperature source Oral, resp. rate 18, SpO2 94 %. Physical Exam  Constitutional: He is oriented to person, place, and time. He appears well-developed and well-nourished.  HENT:  Head: Normocephalic and atraumatic.  Eyes: Conjunctivae and EOM are normal. Pupils are equal, round, and reactive to light.  Neck: Normal range of motion.  Respiratory: Effort normal.  Musculoskeletal: Normal range of motion.  Neurological: He is alert and oriented to person, place, and time.  Skin: Skin is dry.  Psychiatric: He has a normal mood and affect. His behavior is normal. Judgment and thought content normal.     Eyes: Right Periorbital hematoma No APD, EOM full AC wnl from gross exam   Assessment/Plan Orbital Hematoma Right Side Admit for Hematoma Evacuation and Orbital Exploration Right Side.   Clista Bernhardt, MD 05/07/2016, 8:45 PM

## 2016-05-07 NOTE — ED Triage Notes (Signed)
Patient arrived with his surgeon with right eye swelling / bruise onset this afternoon , he had a surgery "lipoma" done by Dr. Kristeen Miss this morning . His surgeon advised nurse first that she is going to OR .

## 2016-05-07 NOTE — Brief Op Note (Signed)
05/07/2016  9:23 PM  PATIENT:  [name]  83 y.o. male  PRE-OPERATIVE DIAGNOSIS:  hematoma, periorbital bleeding right eye, status post mass removal  POST-OPERATIVE DIAGNOSIS:  same  PROCEDURE:  Procedure(s): Evacuation of hematoma, right peri-orbital bleed. (Right)  SURGEON:  Surgeon(s) and Role:    * Clista Bernhardt, MD - Primary  PHYSICIAN ASSISTANT:   ASSISTANTS: none   ANESTHESIA:   local and IV sedation  EBL:  No intake/output data recorded.  BLOOD ADMINISTERED:none  DRAINS: none   LOCAL MEDICATIONS USED:  BUPIVICAINE , LIDOCAINE  and OTHER Epinephrine  SPECIMEN:  No Specimen  DISPOSITION OF SPECIMEN:  N/A  COUNTS:  YES  TOURNIQUET:  none DICTATION: .Note written in EPIC  PLAN OF CARE: Admit for overnight observation  PATIENT DISPOSITION:  PACU - hemodynamically stable.   Delay start of Pharmacological VTE agent (>24hrs) due to surgical blood loss or risk of bleeding: yes

## 2016-05-07 NOTE — Transfer of Care (Signed)
Immediate Anesthesia Transfer of Care Note  Patient: Randy Frazier  Procedure(s) Performed: Procedure(s): Evacuation of hematoma, right peri-orbital bleed. (Right)  Patient Location: PACU  Anesthesia Type:General  Level of Consciousness: awake, alert  and oriented  Airway & Oxygen Therapy: Patient Spontanous Breathing and Patient connected to nasal cannula oxygen  Post-op Assessment: Report given to RN, Post -op Vital signs reviewed and stable and Patient moving all extremities  Post vital signs: Reviewed and stable  Last Vitals:  Vitals:   05/07/16 2020  BP: (!) 162/68  Pulse: 73  Resp: 18  Temp: 36.8 C    Last Pain:  Vitals:   05/07/16 2020  TempSrc: Oral         Complications: No apparent anesthesia complications

## 2016-05-07 NOTE — H&P (Signed)
Subjective:     Randy Frazier is a 83 y.o. male who presents for evaluation of Right Periorbital Pain. The pain is described as aching, sharp, shooting, stabbing and throbbing and is 6/10 in intensity.  Pain pattern is is constant Onset was a few hours ago. Symptoms have been unchanged since. Aggravating factors: pressure over the wound site.  Alleviating factors: nothing. Associated symptoms: The patient denies other associated symptoms.. The patient denies any other associated symptoms. .  Patient History:  The following portions of the patient's history were reviewed and updated as appropriate: allergies, current medications, past family history, past medical history, past social history, past surgical history and problem list.  Review of Systems Pertinent items are noted in HPI.    Objective:    BP (!) 162/68 (BP Location: Left Arm)   Pulse 73   Temp 98.3 F (36.8 C) (Oral)   Resp 18   SpO2 94%   General:  alert, cooperative, appears stated age and no distress  Skin:  purpura/ecchymosis noted on face  Eyes: positive findings: eyelids/periorbital: ecchymosis on the right  Mouth: MMM no lesions  Lymph Nodes:  Cervical, supraclavicular, and axillary nodes normal.  Lungs:  clear to auscultation bilaterally  Heart:  regular rate and rhythm, S1, S2 normal, no murmur, click, rub or gallop  Abdomen: soft, non-tender; bowel sounds normal; no masses,  no organomegaly  CVA:  absent  Genitourinary: defer exam  Extremities:  extremities normal, atraumatic, no cyanosis or edema  Neurologic:  Alert and oriented x3. Gait normal. Reflexes and motor strength normal and symmetric. Cranial nerves 2-12 and sensation grossly intact.  Psychiatric:  normal mood, behavior, speech, dress, and thought processes     Assessment:     Right Periorbital Hematoma     Plan:    1. Discussed the risk of surgery including bleeding, infection, and loss of vision,  and the risks of general anesthetic  including MI, CVA, sudden death or even reaction to anesthetic medications. The patient understands the risks, any and all questions were answered to the patient's satisfaction. 2. Right Orbital Exploration and Hematoma Evacuation 3. Follow up: 1 day.  Date of Surgery Update (To be completed by Attending Surgeon day of surgery.)  There have been no significant clinical changes since the completion of the above H&P.

## 2016-05-07 NOTE — Op Note (Signed)
Procedure(s): Evacuation of hematoma, right peri-orbital bleed. Procedure Note  PIUS BYROM male 83 y.o. 05/07/2016  Procedure(s) and Anesthesia Type:    * Evacuation of hematoma, right peri-orbital bleed. - General  Surgeon(s) and Role:    * Clista Bernhardt, MD - Primary   Indications: The patient was admitted to the hospital with a brief history of right-sided periorbital hematoma. The patient now presents for evacuation of hematoma and orbital exploration after discussing therapeutic alternatives.        Surgeon: Clista Bernhardt   Assistants: none  Anesthesia: Monitored Local Anesthesia with Sedation  ASA Class: 3    Procedure Detail  Evacuation of hematoma, right peri-orbital bleed. Patient is aware that they have a right periorbital bleed. The patient understands the risks of the procedure including but not limited to bleeding, infection, blindness, and need for re-operation. Patient agrees to proceed with the procedure.   Patient was transported to the operating room in supine position where they were prepped and draped in the standard fashion for oculoplastic surgery. Attention was turned to the the periorbital area. The packing was removed. Then 4-0 silk was used to tie off any visible bleeders. The area was rinsed with BSS. Then hemostasis was achieved with a bipolar cautery. Then surgiflow was placed in the defect. Then the defect was closed with subcuticular 5-0 vicryl and then the skin was closed in an interrupted fashion with 6-0 nylon. The area was patched. Bacitracin was placed on the raw periorbital skin.   Findings: Periorbital hematoma  Estimated Blood Loss:  less than 100 mL         Drains: none         Total IV Fluids: 368ml  Blood Given: none          Specimens: none         Implants: none        Complications:  * No complications entered in OR log *         Disposition: PACU - hemodynamically stable.         Condition: stable

## 2016-05-07 NOTE — ED Provider Notes (Signed)
[facility] Emergency Department Provider Note  ____________________________________________   First MD Initiated Contact with Patient 05/07/16 1527     (approximate)  I have reviewed the triage vital signs and the nursing notes.   HISTORY  Chief Complaint Allergic Reaction   HPI Randy Frazier is a 83 y.o. male with a history of atrial fibrillation as well as coronary artery disease on Plavix was presenting to the emergency department with swelling above his right eye. He said that he had a lipoma removed this morning as well as a procedure on a tear duct. He said that about 1 hour prior to arrival he started to experience swelling at the site of the lipoma removal. He has since had ecchymosis and became concerned because of the size of the swelling.   Past Medical History:  Diagnosis Date  . Arrhythmia    A-Fib  . BPH (benign prostatic hyperplasia)   . Coronary artery disease 2009   CABG X 3  @ [facility]  . CVA (cerebral infarction) 2014  . Diabetes mellitus without complication ([place])   . Enlarged prostate   . GERD (gastroesophageal reflux disease)   . Hyperlipidemia   . Hypertension   . Renal disorder     Patient Active Problem List   Diagnosis Date Noted  . Stroke ([place]) 08/18/2012  . Gout 08/18/2012  . Depression 07/31/2011  . Diabetes mellitus ([place]) 06/19/2010  . Hyperlipidemia 12/11/2009  . HYPERTENSION, BENIGN 12/11/2009  . CAD (coronary artery disease) 12/11/2009  . CAD, AUTOLOGOUS BYPASS GRAFT 12/11/2009    Past Surgical History:  Procedure Laterality Date  . cataract surgery Bilateral   . CORONARY ARTERY BYPASS GRAFT  2009   CABG X 3 @ [facility]    Prior to Admission medications   Medication Sig Start Date End Date Taking? Authorizing Provider  amLODipine (NORVASC) 10 MG tablet TAKE 1 TABLET BY MOUTH EVERY DAY 10/03/12   Minna Merritts, MD  aspirin EC 81 MG tablet Take 81 mg by mouth.    Historical Provider, MD  atorvastatin  (LIPITOR) 10 MG tablet TAKE 1 TABLET BY MOUTH EVERY DAY 10/14/13   Minna Merritts, MD  clopidogrel (PLAVIX) 75 MG tablet Take 1 tablet (75 mg total) by mouth daily. 05/01/16   Minna Merritts, MD  Fluoxetine HCl, PMDD, 20 MG CAPS Take 20 mg by mouth daily.    Historical Provider, MD  gabapentin (NEURONTIN) 300 MG capsule Take 300 mg by mouth 3 (three) times daily.  01/24/12   Historical Provider, MD  insulin glargine (LANTUS) 100 UNIT/ML injection Inject 30 Units into the skin at bedtime.     Historical Provider, MD  lisinopril (PRINIVIL,ZESTRIL) 20 MG tablet Take 20 mg by mouth daily.  01/27/13   Historical Provider, MD  Melatonin 500 MCG TBDP Take 500 mcg by mouth at bedtime.    Historical Provider, MD  metFORMIN (GLUCOPHAGE) 500 MG tablet Take 1,000 mg by mouth 2 (two) times daily with a meal.     Historical Provider, MD  metoprolol tartrate (LOPRESSOR) 25 MG tablet Take 25 mg by mouth 2 (two) times daily.      Historical Provider, MD  omeprazole (PRILOSEC) 40 MG capsule Take 40 mg by mouth daily.      Historical Provider, MD    Allergies Penicillins  Family History  Problem Relation Age of Onset  . Family history unknown: Yes    Social History Social History  Substance Use Topics  . Smoking status:  Never Smoker  . Smokeless tobacco: Never Used  . Alcohol use No     Comment: occasional    Review of Systems Constitutional: No fever/chills Eyes: as above ENT: No sore throat. Cardiovascular: Denies chest pain. Respiratory: Denies shortness of breath. Gastrointestinal: No abdominal pain.  No nausea, no vomiting.  No diarrhea.  No constipation. Genitourinary: Negative for dysuria. Musculoskeletal: Negative for back pain. Skin: Negative for rash. Neurological: Negative for headaches, focal weakness or numbness.  10-point ROS otherwise negative.  ____________________________________________   PHYSICAL EXAM:  VITAL SIGNS: ED Triage Vitals  Enc Vitals Group     BP  05/07/16 1522 (!) 175/72     Pulse Rate 05/07/16 1522 68     Resp 05/07/16 1522 18     Temp 05/07/16 1522 97.6 F (36.4 C)     Temp Source 05/07/16 1522 Oral     SpO2 05/07/16 1522 95 %     Weight 05/07/16 1522 190 lb (86.2 kg)     Height 05/07/16 1522 6' (1.829 m)     Head Circumference --      Peak Flow --      Pain Score 05/07/16 1521 10     Pain Loc --      Pain Edu? --      Excl. in [place]? --     Constitutional: Alert and oriented. Well appearing and in no acute distress. Eyes: Conjunctivae are normal. PERRL. EOMI.  6 x 10 cm hematoma overlying the supraorbital ridge where the patient had his incision done to have a lipoma removed. There is also ecchymosis laterally as well as to the upper eyelid. The right eye is swollen shut however, I'm able to open the eyelids and the patient's eye exam is normal otherwise. Small amount of red blood over the incision superior to the supraorbital ridge but no active bleeding visualized. Head: Atraumatic. Nose: No congestion/rhinnorhea. Mouth/Throat: Mucous membranes are moist.   Neck: No stridor.   Cardiovascular: Normal rate, regular rhythm. Grossly normal heart sounds.   Respiratory: Normal respiratory effort.  No retractions. Lungs CTAB. Gastrointestinal: Soft and nontender. No distention.  Musculoskeletal: No lower extremity tenderness nor edema.  No joint effusions. Neurologic:  Normal speech and language. No gross focal neurologic deficits are appreciated.  Skin:  Skin is warm, dry and intact. No rash noted. Psychiatric: Mood and affect are normal. Speech and behavior are normal.  ____________________________________________   LABS (all labs ordered are listed, but only abnormal results are displayed)  Labs Reviewed - No data to display ____________________________________________  EKG   ____________________________________________  RADIOLOGY   ____________________________________________   PROCEDURES  Procedure(s)  performed:   Procedures  Critical Care performed:   ____________________________________________   INITIAL IMPRESSION / ASSESSMENT AND PLAN / ED COURSE  Pertinent labs & imaging results that were available during my care of the patient were reviewed by me and considered in my medical decision making (see chart for details).  ----------------------------------------- 4:30 PM on 05/07/2016 -----------------------------------------  Discussed the case with patient's surgical center. His surgeon, Dr. Isac Sarna, request that he return immediately for further evaluation. I discussed with the patient he'll be discharged. He is understandable and return immediately to the surgical center.      ____________________________________________   FINAL CLINICAL IMPRESSION(S) / ED DIAGNOSES  Postop bleeding. Hematoma.    NEW MEDICATIONS STARTED DURING THIS VISIT:  New Prescriptions   No medications on file     Note:  This document was prepared using Dragon voice recognition  software and may include unintentional dictation errors.    Orbie Pyo, MD 05/07/16 [phone]

## 2016-05-07 NOTE — ED Notes (Signed)
Pressure bandage applied.

## 2016-05-07 NOTE — ED Triage Notes (Signed)
Pt comes into the ED via POV c/o allergic reaction after having a lipoma removed off the top of his eye and from having a clogged tear duct.  Patient had the local anesthetic and within an hour started having severe swelling over the eye, bruising, and now unable to see out of that eye.  Patient has even and unlabored respirations and denies any difficulties swallowing.

## 2016-05-07 NOTE — ED Notes (Signed)
Pt verbalized understanding of discharge instructions. NAD at this time. 

## 2016-05-07 NOTE — Discharge Instructions (Signed)
Pt should keep patch on until seen by provider. Come straight from the hospital to the eye clinic.

## 2016-05-07 NOTE — Anesthesia Preprocedure Evaluation (Addendum)
Anesthesia Evaluation  Patient identified by MRN, date of birth, ID band Patient awake    Reviewed: Allergy & Precautions, NPO status , Patient's Chart, lab work & pertinent test results  Airway Mallampati: II  TM Distance: >3 FB Neck ROM: Full    Dental  (+) Dental Advisory Given   Pulmonary neg pulmonary ROS,    breath sounds clear to auscultation       Cardiovascular hypertension, Pt. on medications and Pt. on home beta blockers + CAD and + CABG   Rhythm:Regular Rate:Normal     Neuro/Psych CVA    GI/Hepatic Neg liver ROS, GERD  ,  Endo/Other  diabetes, Type 2, Oral Hypoglycemic Agents, Insulin Dependent  Renal/GU Renal disease     Musculoskeletal   Abdominal   Peds  Hematology   Anesthesia Other Findings   Reproductive/Obstetrics                            Anesthesia Physical Anesthesia Plan  ASA: III and emergent  Anesthesia Plan: General   Post-op Pain Management:    Induction: Intravenous and Rapid sequence  Airway Management Planned: Oral ETT  Additional Equipment:   Intra-op Plan:   Post-operative Plan: Extubation in OR  Informed Consent: I have reviewed the patients History and Physical, chart, labs and discussed the procedure including the risks, benefits and alternatives for the proposed anesthesia with the patient or authorized representative who has indicated his/her understanding and acceptance.   Dental advisory given  Plan Discussed with: CRNA  Anesthesia Plan Comments:         Anesthesia Quick Evaluation

## 2016-05-08 ENCOUNTER — Encounter (HOSPITAL_COMMUNITY): Payer: Self-pay | Admitting: Oculoplastics Ophthalmology

## 2016-05-08 ENCOUNTER — Other Ambulatory Visit: Payer: Self-pay | Admitting: Oculoplastics Ophthalmology

## 2016-05-08 DIAGNOSIS — H59341 Postprocedural hematoma of right eye and adnexa following other procedure: Secondary | ICD-10-CM | POA: Diagnosis not present

## 2016-05-08 LAB — APTT: aPTT: 28 seconds (ref 24–36)

## 2016-05-08 LAB — CBC
HEMATOCRIT: 38.7 % — AB (ref 39.0–52.0)
Hemoglobin: 12.9 g/dL — ABNORMAL LOW (ref 13.0–17.0)
MCH: 30.1 pg (ref 26.0–34.0)
MCHC: 33.3 g/dL (ref 30.0–36.0)
MCV: 90.2 fL (ref 78.0–100.0)
Platelets: 273 10*3/uL (ref 150–400)
RBC: 4.29 MIL/uL (ref 4.22–5.81)
RDW: 16.1 % — ABNORMAL HIGH (ref 11.5–15.5)
WBC: 6.2 10*3/uL (ref 4.0–10.5)

## 2016-05-08 LAB — POCT I-STAT 4, (NA,K, GLUC, HGB,HCT)
GLUCOSE: 197 mg/dL — AB (ref 65–99)
HEMATOCRIT: 46 % (ref 39.0–52.0)
HEMOGLOBIN: 15.6 g/dL (ref 13.0–17.0)
POTASSIUM: 4.2 mmol/L (ref 3.5–5.1)
SODIUM: 140 mmol/L (ref 135–145)

## 2016-05-08 LAB — PROTIME-INR
INR: 1.03
Prothrombin Time: 13.6 seconds (ref 11.4–15.2)

## 2016-05-08 LAB — GLUCOSE, CAPILLARY: Glucose-Capillary: 175 mg/dL — ABNORMAL HIGH (ref 65–99)

## 2016-05-08 MED ORDER — CLOPIDOGREL BISULFATE 75 MG PO TABS: 75.0000 mg | ORAL_TABLET | Freq: Every day | ORAL | 3 refills | Status: DC

## 2016-05-08 MED ORDER — ACETAMINOPHEN-CODEINE #3 300-30 MG PO TABS: 1.0000 | ORAL_TABLET | ORAL | 0 refills | Status: DC | PRN

## 2016-05-08 NOTE — Progress Notes (Signed)
Pt requesting milk shake from cookout.  Explained to pt that I could not leave to get him [employer], but that I would try to find some ice cream and bring it to him.  Vanilla  Ice cream provided to pt.

## 2016-05-08 NOTE — Progress Notes (Signed)
Called to see patient regarding service recovery. Spoke with patient. Patient stated that he is supposed to discharge this am to go to his MD appointment with Dr. Kristeen Miss at [facility]. Patient concerned that he will miss his appointment and his son is on his way. Informed patient that his discharge order is written however the MD needs to reconcile the medications on his After Visit Summary.  Patient stated that he was upset that he ended up in the hospital, and was afraid that he had lost the vision in his eye.   DD Called Dr. Kristeen Miss at the office, and made her aware of the patients concerns and the need to reconcile the AVS. Requested Dr. Kristeen Miss to speak with the patient via phone to hear his concerns. Call transferred to patient room, and the patient and MD Agubo spoke.   Patient verbalized that he did not feel that he should be responsible for this hospital stay.  Drue Harr, Bryn Gulling

## 2016-05-08 NOTE — Care Management Obs Status (Signed)
[place] NOTIFICATION   Patient Details  Name: LORD LANCOUR MRN: [id number] Date of Birth: 1927-04-01   Medicare Observation Status Notification Given:  Other (see comment) (Pt for overnight stay only, d/c early this am.)  Pt d/c before IM presented.   Jadyn Barge, Rory Percy, RN 05/08/2016, 12:39 PM

## 2016-05-08 NOTE — Progress Notes (Signed)
Eye patch provided to pt, but pt refusing to wear it.  Pt educated.

## 2016-05-08 NOTE — Progress Notes (Signed)
Arrived in patient 's room to received report from outgoing nurse at [facility].Patient was  Ready to go home,already on his street cloths.Patient said that he needs his breakfast before going to his eye clinic appointment today at 0900.Nurse arranged an early delivery of his breakfast tray and to facilitate it,nurse had to go to kitchen so that patient could have breakfast early as opposed of waiting for normal delivery.Patient also requested to talk to ''patient's advocate staffs".Patient also requested to remove his peripheral I.v. To which the nurse replied that i'll do it upon your discharged.Nurse tried to print his discharged papers but unable to due to the fact the discharged med reconcialition order were not done. Pharmacy unable to help.I called M.D.'s clinic but the doctor was not yet in there at 0800.Spoke to the nurses at the clinic and explained the situation to them and what it needs to be done by the M.D. to fix it.Talked and explained to the patient what is going on.Nurse assisted patient on calling the Patient Experience Dept.Patient insisted of his discharged and the nurse explained it again to patient.Leaderships made aware of the situation.

## 2016-05-08 NOTE — Progress Notes (Signed)
Received a call from opthamology,telling this nurse that discharged papers were ready.Printed,explained and given to the patient.Nurse emphasized to the patient that he has unsigned pain medication prescription and since this is unsigned ,and since patient is going directly to his opthamology,patient needs to request his doctor for signed pain medication prescription.Questions answered satisfactorily upon discharged.Nurse called [facility] and spoke to [facility] that the patient is on the way to them and his doctor needs to give a signed pain medication prescription to the patient.

## 2016-05-08 NOTE — Anesthesia Postprocedure Evaluation (Signed)
Anesthesia Post Note  Patient: Randy Frazier  Procedure(s) Performed: Procedure(s) (LRB): Evacuation of hematoma, right peri-orbital bleed. (Right)  Patient location during evaluation: PACU Anesthesia Type: General Level of consciousness: awake and alert Pain management: pain level controlled Vital Signs Assessment: post-procedure vital signs reviewed and stable Respiratory status: spontaneous breathing, nonlabored ventilation, respiratory function stable and patient connected to nasal cannula oxygen Cardiovascular status: blood pressure returned to baseline and stable Postop Assessment: no signs of nausea or vomiting Anesthetic complications: no       Last Vitals:  Vitals:   05/07/16 2333 05/08/16 0514  BP: (!) 148/70 (!) 132/53  Pulse: 71 71  Resp: 16 14  Temp: 36.7 C 37.1 C    Last Pain:  Vitals:   05/07/16 2020  TempSrc: Leonard Schwartz

## 2016-05-08 NOTE — Progress Notes (Signed)
05/08/2016 10:00 AM  Patient discharge order has been reconciled and ready for print. Gwenlyn Perking RN provided patient with discharge documents. Please see Albert's note for more details   Caitlynn Ju [employer], RN-BC, [employer] [facility] Phone [phone]

## 2016-05-10 ENCOUNTER — Emergency Department (HOSPITAL_COMMUNITY)
Admission: EM | Admit: 2016-05-10 | Discharge: 2016-05-11 | Disposition: A | Discharge status: Other Acute Inpatient Hospital | Payer: Medicare Other | Attending: Emergency Medicine | Admitting: Emergency Medicine

## 2016-05-10 ENCOUNTER — Encounter (HOSPITAL_COMMUNITY): Payer: Self-pay | Admitting: Emergency Medicine

## 2016-05-10 ENCOUNTER — Emergency Department (HOSPITAL_COMMUNITY): Payer: Medicare Other

## 2016-05-10 ENCOUNTER — Encounter (HOSPITAL_COMMUNITY): Payer: Self-pay | Admitting: Anesthesiology

## 2016-05-10 ENCOUNTER — Encounter (HOSPITAL_COMMUNITY)
Admission: EM | Disposition: A | Discharge status: Other Acute Inpatient Hospital | Payer: Self-pay | Source: Home / Self Care | Attending: Emergency Medicine

## 2016-05-10 DIAGNOSIS — Z7982 Long term (current) use of aspirin: Secondary | ICD-10-CM | POA: Insufficient documentation

## 2016-05-10 DIAGNOSIS — Z951 Presence of aortocoronary bypass graft: Secondary | ICD-10-CM | POA: Insufficient documentation

## 2016-05-10 DIAGNOSIS — R220 Localized swelling, mass and lump, head: Secondary | ICD-10-CM | POA: Insufficient documentation

## 2016-05-10 DIAGNOSIS — Z8673 Personal history of transient ischemic attack (TIA), and cerebral infarction without residual deficits: Secondary | ICD-10-CM | POA: Diagnosis not present

## 2016-05-10 DIAGNOSIS — Z794 Long term (current) use of insulin: Secondary | ICD-10-CM | POA: Insufficient documentation

## 2016-05-10 DIAGNOSIS — I10 Essential (primary) hypertension: Secondary | ICD-10-CM | POA: Diagnosis not present

## 2016-05-10 DIAGNOSIS — E119 Type 2 diabetes mellitus without complications: Secondary | ICD-10-CM | POA: Diagnosis not present

## 2016-05-10 DIAGNOSIS — Z5181 Encounter for therapeutic drug level monitoring: Secondary | ICD-10-CM | POA: Diagnosis not present

## 2016-05-10 DIAGNOSIS — Z79899 Other long term (current) drug therapy: Secondary | ICD-10-CM | POA: Insufficient documentation

## 2016-05-10 DIAGNOSIS — I2510 Atherosclerotic heart disease of native coronary artery without angina pectoris: Secondary | ICD-10-CM | POA: Diagnosis not present

## 2016-05-10 DIAGNOSIS — L7682 Other postprocedural complications of skin and subcutaneous tissue: Secondary | ICD-10-CM | POA: Diagnosis present

## 2016-05-10 LAB — COMPREHENSIVE METABOLIC PANEL
ALBUMIN: 3.7 g/dL (ref 3.5–5.0)
ALK PHOS: 47 U/L (ref 38–126)
ALT: 34 U/L (ref 17–63)
AST: 39 U/L (ref 15–41)
Anion gap: 9 (ref 5–15)
BUN: 19 mg/dL (ref 6–20)
CALCIUM: 9 mg/dL (ref 8.9–10.3)
CO2: 26 mmol/L (ref 22–32)
CREATININE: 1.41 mg/dL — AB (ref 0.61–1.24)
Chloride: 100 mmol/L — ABNORMAL LOW (ref 101–111)
GFR calc non Af Amer: 44 mL/min — ABNORMAL LOW (ref >60)
GFR, EST AFRICAN AMERICAN: 50 mL/min — AB (ref >60)
GLUCOSE: 111 mg/dL — AB (ref 65–99)
Potassium: 4 mmol/L (ref 3.5–5.1)
SODIUM: 135 mmol/L (ref 135–145)
Total Bilirubin: 0.9 mg/dL (ref 0.3–1.2)
Total Protein: 6.7 g/dL (ref 6.5–8.1)

## 2016-05-10 LAB — CBC WITH DIFFERENTIAL/PLATELET
BASOS ABS: 0.1 10*3/uL (ref 0.0–0.1)
Basophils Relative: 1 %
Eosinophils Absolute: 0.3 10*3/uL (ref 0.0–0.7)
Eosinophils Relative: 5 %
HEMATOCRIT: 38.3 % — AB (ref 39.0–52.0)
HEMOGLOBIN: 12.8 g/dL — AB (ref 13.0–17.0)
LYMPHS ABS: 2 10*3/uL (ref 0.7–4.0)
Lymphocytes Relative: 33 %
MCH: 30 pg (ref 26.0–34.0)
MCHC: 33.4 g/dL (ref 30.0–36.0)
MCV: 89.7 fL (ref 78.0–100.0)
Monocytes Absolute: 0.7 10*3/uL (ref 0.1–1.0)
Monocytes Relative: 11 %
Neutro Abs: 2.9 10*3/uL (ref 1.7–7.7)
Neutrophils Relative %: 50 %
PLATELETS: 258 10*3/uL (ref 150–400)
RBC: 4.27 MIL/uL (ref 4.22–5.81)
RDW: 15.6 % — ABNORMAL HIGH (ref 11.5–15.5)
WBC: 5.9 10*3/uL (ref 4.0–10.5)

## 2016-05-10 LAB — PROTIME-INR
INR: 1.05
Prothrombin Time: 13.8 seconds (ref 11.4–15.2)

## 2016-05-10 LAB — APTT: APTT: 29 s (ref 24–36)

## 2016-05-10 SURGERY — CANCELLED PROCEDURE
Site: Eye

## 2016-05-10 MED ORDER — BACITRACIN-POLYMYXIN B 500-10000 UNIT/GM OP OINT
TOPICAL_OINTMENT | OPHTHALMIC | Status: AC
  Filled 2016-05-10: qty 3.5

## 2016-05-10 MED ORDER — BSS IO SOLN
INTRAOCULAR | Status: AC
  Filled 2016-05-10: qty 15

## 2016-05-10 MED ORDER — BSS IO SOLN
INTRAOCULAR | Status: DC | PRN
  Administered 2016-05-10: 15 mL via INTRAOCULAR

## 2016-05-10 MED ORDER — BUPIVACAINE HCL (PF) 0.25 % IJ SOLN
INTRAMUSCULAR | Status: AC
  Filled 2016-05-10: qty 30

## 2016-05-10 MED ORDER — PROPOFOL 10 MG/ML IV BOLUS
INTRAVENOUS | Status: AC
  Filled 2016-05-10: qty 20

## 2016-05-10 MED ORDER — FENTANYL CITRATE (PF) 250 MCG/5ML IJ SOLN
INTRAMUSCULAR | Status: AC
  Filled 2016-05-10: qty 5

## 2016-05-10 MED ORDER — LIDOCAINE-EPINEPHRINE 2 %-1:100000 IJ SOLN
INTRAMUSCULAR | Status: AC
  Filled 2016-05-10: qty 1

## 2016-05-10 NOTE — Consult Note (Addendum)
Randy Frazier                                                                                         05/10/2016                                               Ophthalmology Evaluation                                          Consult Requested by: Dr. Jeanell Sparrow Consultation requested for:  Randy Hick, MD    HPI:  83 y/o WM who presents with right upper lid (RUL) hematoma s/p lipoma excision on 05/08/16 by Dr. Georjean Mode.  Patient has a h/o Plavix use, but discontinued 10 days ago on 04/30/16 in anticipation for lipoma surgery.  Hours after the original surgery, patient had bleeding which was unable to be controlled in the clinic.  He was subsequently admitted to [facility] ED same day on 05/07/16 and taken to OR for evacuation of hematoma and hemostasis.  Today, he reports recurrent heme and hematoma recurrence with local swelling and tenderness.  He stated that he has attempted to apply pressure for 2 hours at home but has not been able to successfully stop the hemorrhage.  Patient states OD has been somewhat blurry since the original surgery without acute change, otherwise no new complaints.  Pertinent Medical History:  Active Ambulatory Problems    Diagnosis Date Noted  . Hyperlipidemia 12/11/2009  . HYPERTENSION, BENIGN 12/11/2009  . Coronary atherosclerosis 12/11/2009  . CAD, AUTOLOGOUS BYPASS GRAFT 12/11/2009  . Diabetes mellitus type II, controlled ([place]) 06/19/2010  . Depressive disorder, not elsewhere classified 07/31/2011  . Cerebrovascular accident (CVA) ([place]) 08/18/2012  . Gout 08/18/2012  . Periorbital hematoma 05/07/2016  . Acute kidney failure, unspecified 05/07/2016  . Benign prostatic hyperplasia with urinary obstruction 08/17/2015  . BPH (benign prostatic hyperplasia) 06/19/2011  . Chronic sinusitis 05/07/2016  . Controlled type 2 diabetes mellitus without complication ([place]) 16/11/9602  . DM type 2 with diabetic peripheral neuropathy ([place]) 08/10/2015  .  Hypertension 06/19/2011  . Hypertrophy of prostate with urinary obstruction and other lower urinary tract symptoms (LUTS) 05/07/2016  . Psoriasis 08/16/2015  . 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   Resolved Ambulatory Problems    Diagnosis Date Noted  . No Resolved Ambulatory Problems   Past Medical History:  Diagnosis Date  . Arrhythmia   . BPH (benign prostatic hyperplasia)   . Coronary artery disease 2009  . CVA (cerebral infarction) 2014  . Diabetes mellitus without complication ([place])   . Enlarged prostate   . GERD (gastroesophageal reflux disease)   . Hyperlipidemia   . Hypertension   . Renal disorder     Pertinent Ophthalmic History:  s/p CE/IOL OU in about 2013 by Dr. Gershon Crane  s/p RUL lipoma excision may years ago with severing of "my trigeminal nerve and no  feeling over my forehead", then recently by Dr. Kristeen Miss on 05/08/16, followed by evacuation of hematoma and hemostasis later same evening  Current Eye Medications:  None  Allergies:  Penicilins  ROS: Constitutional:  No fever, no chills, no weight change.  Ocular: as per above.  HEENT:  No sore throat, earache, or congestion. RUL tenderness and pain. CV:  No chest pain. No palpitations.  Respiratory:  No shortness of breath or cough.  GI:  No nausea, no vomiting, no diarrhea, no constipation, no anorexia.  GU:  No dysuria, frequency, or urgency. No hematuria.  Musculoskeletal:  No joint pain or swelling or edema.  Skin:  No rash or itching.  Endocrine:  No polyuria or polydipsia. Psychiatric:  No anxiety, no depression.    Confrontation Visual Fields:  OD:  Full to count fingers OS:  Full to count fingers  Extraocular muscles (EOMs): OD:  Full OS:  Full  Pupils:  OD:  Round, reactive, equal, symmetric, no rAPD OS:  Round, reactive, equal, symmetric, no rAPD  Visual Acuity: Near vision with +2.50 correction OD:  20/40-2 OS:  20/25 +1  Intraocular Pressure (IOP):  With Tonopen OD: 18 mmhg         OS: 18 mmhg      Lids/Lashes:  OD:  No neck pain.  RLL hematoma with steri strip in place, tender to the touch, mobile, no current active hemorrhage, mechanical ptosis.  Ecchymosis of right face. OS:  Normal for age  37:  OD:  White and quiet OS:  White and quiet  Cornea:  OD: Clear OS: Clear  Anterior [facility]):  OD:  Deep and quiet OS:  Deep and quiet  Iris:  OD:  Round and reactive   OS:  Round and reactive     Lens:  OD:  PCIOL in good position OS:  PCIOL in good position  Dilation:  Both eyes Medication used  [ x ] NS 2.5% [x  ]Tropicamide  [  ] Cyclogyl [  ] Cyclomydril      Vitreous: OD:  Clear OS:  Clear  Optic Nerve (ON): OD:  Sharp margins, pink neuroretinal rim, C:D 0.4 (difficult to gauge with shallow cupping) OS:  Sharp margins, pink neuroretinal rim, C:D 0.4 (difficult to gauge with shallow cupping)  Macula: OD:  Flat without hemorrhage OS:  Flat without hemorrhage  Retina Vessels: OD:  Normal for age OS:  Normal for age  Peripheral Retina:  OD:  Flat and attached in all areas seen  OS:  Flat and attached in all areas seen   Impression/Discussion: 83 y/o WM who presents with recurrent left upper lid (RLL) hematoma s/p lipoma excision on 05/08/16 by Dr. Georjean Mode. Case directly d/w Dr. Kristeen Miss who recommends return to OR for repeat evacuation of hematoma and hemostasis with her.  OR desk notified on behalf of Dr. Kristeen Miss and bleeding diathesis work-up pending.  Patient to be admitted under Dr. Kristeen Miss who will take over managing patient's care.  Recommendations/Plan: As per above.  Planned studies/Lab testing:  None from Ophthalmology.  Bleeding diathesis work-up, EKG, and CXR as per primary team in ED.  Procedures:  As per above.  Randy Hick MD   Martin Majestic to evaluate the patient. The orbit is non-tense, there is a palpable non tense hematoma supero-nasally. Patient with new peri-nasal and periorbital chemosis on the right side. Explained  to the patient the situation. Patient is refusing surgery here and would like to be transferred to [facility].  Explained to the patient that both times the patient was dry and not bleeding when he left the facility and the second time was witnessed by the office staff and the patient's son. The patient lives by himself and was initially admitted for observation after the first intervention to assure post operative care. At this point It is uncertain whether the patient has been appropriately taking care of the wound site. Talked extensively to the attending and the PA taking care of the patient. Patient reported to the PA that after going home he felt really sick and all day yesterday he was nauseas and had several episodes of emesis and then he started to ooze from his wound site early today. Spoke to the patient who personally said that he has diarrhea and multiple coughing fits. Patient reported that he does not want a tube down his throat because he feels that that is what made him sick and made him have the multiple episodes of emesis this past weekend. Patient is really concerned about having a tube down his throat. I also explained to the patient that the best option for his eye/orbit to him was to have it opened here and that the OR is ready now for the procedure but the patient would like to be seen at [facility]. Will initiate the transfer process. Spoke with Dr. Gennaro Africa at [facility] who will accept the patient to the service.

## 2016-05-10 NOTE — ED Notes (Signed)
Patient transported to X-ray 

## 2016-05-10 NOTE — ED Provider Notes (Signed)
[facility] DEPT Provider Note   CSN: [id number] Arrival date & time: 05/10/16  0623  Medical screening exam   History   Chief Complaint Chief Complaint  Patient presents with  . Post-op Problem    HPI Randy Frazier is a 83 y.o. male.  HPI  He presents today for evaluation by Dr. Arlina Robes, Ophthalmologist.  On the 28th he had a lipoma removed from over his right eye by Dr. Kristeen Miss on the 28th.  That afternoon he experienced a hematoma formation that required drainage and repair in the OR.   He is here reporting that the same thing has happened again.   He reports that he can feel the swelling above his right eye and has felt it draining.  He denies any other complaints. No chest pain, shortness of breath, nausea, vomiting, headache, fevers, chills.  He denies any weakness, numbness, changes in sensation, loss of coordination or strength.    He reports that yesterday he was nauseous, vomiting and blames that on the anesthesia and having a tube in his throat.   Past Medical History:  Diagnosis Date  . Arrhythmia    A-Fib  . BPH (benign prostatic hyperplasia)   . Coronary artery disease 2009   CABG X 3  @ [facility]  . CVA (cerebral infarction) 2014  . Diabetes mellitus without complication ([place])   . Enlarged prostate   . GERD (gastroesophageal reflux disease)   . Hyperlipidemia   . Hypertension   . Renal disorder     Patient Active Problem List   Diagnosis Date Noted  . Periorbital hematoma 05/07/2016  . Acute kidney failure, unspecified 05/07/2016  . Chronic sinusitis 05/07/2016  . Hypertrophy of prostate with urinary obstruction and other lower urinary tract symptoms (LUTS) 05/07/2016  . Benign prostatic hyperplasia with urinary obstruction 08/17/2015  . Controlled type 2 diabetes mellitus without complication ([place]) 76/28/3151  . Psoriasis 08/16/2015  . 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  . DM type 2 with diabetic peripheral neuropathy ([place]) 08/10/2015  . Cerebrovascular accident  (CVA) ([place]) 08/18/2012  . Gout 08/18/2012  . Depressive disorder, not elsewhere classified 07/31/2011  . BPH (benign prostatic hyperplasia) 06/19/2011  . Hypertension 06/19/2011  . Diabetes mellitus type II, controlled ([place]) 06/19/2010  . Hyperlipidemia 12/11/2009  . HYPERTENSION, BENIGN 12/11/2009  . Coronary atherosclerosis 12/11/2009  . CAD, AUTOLOGOUS BYPASS GRAFT 12/11/2009    Past Surgical History:  Procedure Laterality Date  . cataract surgery Bilateral   . CORONARY ARTERY BYPASS GRAFT  2009   CABG X 3 @ [facility]  . TEAR DUCT PROBING Right 05/07/2016   Procedure: Evacuation of hematoma, right peri-orbital bleed.;  Surgeon: Clista Bernhardt, MD;  Location: [facility];  Service: Ophthalmology;  Laterality: Right;       Home Medications    Prior to Admission medications   Medication Sig Start Date End Date Taking? Authorizing Provider  acetaminophen-codeine (TYLENOL #3) 300-30 MG tablet Take 1 tablet by mouth every 4 (four) hours as needed for moderate pain. 05/08/16   Clista Bernhardt, MD  amLODipine (NORVASC) 10 MG tablet TAKE 1 TABLET BY MOUTH EVERY DAY 10/03/12   Minna Merritts, MD  aspirin EC 81 MG tablet Take 81 mg by mouth.    Historical Provider, MD  atorvastatin (LIPITOR) 10 MG tablet TAKE 1 TABLET BY MOUTH EVERY DAY 10/14/13   Minna Merritts, MD  clopidogrel (PLAVIX) 75 MG tablet Take 1 tablet (75 mg total) by mouth daily. Hold until Friday 05/09/16 05/08/16  Clista Bernhardt, MD  Fluoxetine HCl, PMDD, 20 MG CAPS Take 20 mg by mouth daily.    Historical Provider, MD  gabapentin (NEURONTIN) 300 MG capsule Take 300 mg by mouth 3 (three) times daily.  01/24/12   Historical Provider, MD  insulin glargine (LANTUS) 100 UNIT/ML injection Inject 30 Units into the skin at bedtime.     Historical Provider, MD  lisinopril (PRINIVIL,ZESTRIL) 20 MG tablet Take 20 mg by mouth daily.  01/27/13   Historical Provider, MD  Melatonin 500 MCG TBDP Take 500 mcg by mouth at bedtime.     Historical Provider, MD  metFORMIN (GLUCOPHAGE) 500 MG tablet Take 1,000 mg by mouth 2 (two) times daily with a meal.     Historical Provider, MD  metoprolol tartrate (LOPRESSOR) 25 MG tablet Take 25 mg by mouth 2 (two) times daily.      Historical Provider, MD  omeprazole (PRILOSEC) 40 MG capsule Take 40 mg by mouth daily.      Historical Provider, MD    Family History Family History  Problem Relation Age of Onset  . Family history unknown: Yes    Social History Social History  Substance Use Topics  . Smoking status: Never Smoker  . Smokeless tobacco: Never Used  . Alcohol use No     Comment: occasional     Allergies   Penicillins   Review of Systems Review of Systems  Constitutional: Negative for chills and fever.  HENT: Negative for ear pain and sore throat.   Eyes: Negative for pain and visual disturbance.  Respiratory: Negative for cough and shortness of breath.   Cardiovascular: Negative for chest pain and palpitations.  Gastrointestinal: Negative for abdominal pain and vomiting.  Genitourinary: Negative for dysuria and hematuria.  Musculoskeletal: Negative for arthralgias and back pain.  Skin: Negative for color change and rash.  Neurological: Negative for seizures, syncope and headaches.  All other systems reviewed and are negative.    Physical Exam Updated Vital Signs BP (!) 158/77   Pulse 69   Resp 20   Ht 6' (1.829 m)   Wt 86.2 kg   SpO2 98%   BMI 25.77 kg/m   Physical Exam  Constitutional: He appears well-developed and well-nourished.  HENT:  Head:    Eyes: Pupils are equal, round, and reactive to light. Right eye exhibits discharge. Left eye exhibits no discharge. Left conjunctiva is not injected. No scleral icterus.  Neck: Neck supple.  Cardiovascular: Normal rate, regular rhythm and intact distal pulses.  Exam reveals no gallop and no friction rub.   No murmur heard. Pulmonary/Chest: Effort normal and breath sounds normal. No respiratory  distress.  Abdominal: Soft. Bowel sounds are normal. He exhibits no distension. There is no tenderness.  Musculoskeletal: He exhibits no edema.  Neurological: He is alert.  Skin: Skin is warm and dry.  Psychiatric: He has a normal mood and affect.  Nursing note and vitals reviewed.    ED Treatments / Results  Labs (all labs ordered are listed, but only abnormal results are displayed) Labs Reviewed  CBC WITH DIFFERENTIAL/PLATELET - Abnormal; Notable for the following:       Result Value   Hemoglobin 12.8 (*)    HCT 38.3 (*)    RDW 15.6 (*)    All other components within normal limits  PROTIME-INR  COMPREHENSIVE METABOLIC PANEL  APTT    EKG  EKG Interpretation None       Radiology Dg Chest 2 View  Result Date: 05/10/2016  CLINICAL DATA:  Preoperative chest radiograph.  Initial encounter. EXAM: CHEST  2 VIEW COMPARISON:  Chest radiograph from 06/17/2015 FINDINGS: The lungs are well-aerated. Mild left basilar scarring is noted. There is no evidence of pleural effusion or pneumothorax. The heart is borderline normal in size. The patient is status post median sternotomy, with evidence of prior CABG. No acute osseous abnormalities are seen. IMPRESSION: Mild basilar scarring noted.  Lungs otherwise grossly clear. Electronically Signed   By: Garald Balding M.D.   On: 05/10/2016 20:32    Procedures Procedures (including critical care time)  Medications Ordered in ED Medications - No data to display   Initial Impression / Assessment and Plan / ED Course  I have reviewed the triage vital signs and the nursing notes.  Pertinent labs & imaging results that were available during my care of the patient were reviewed by me and considered in my medical decision making (see chart for details).    MSE was initiated and I personally evaluated the patient and placed orders requested by ophthalmology for pre-operative evaluation.    The Patient was seen as a medical screening exam while  awaiting Dr. Lorrin Goodell to take him to the operating room.   The patient appears reasonably stabilized for admission considering the current resources, flow, and capabilities available in the ED at this time, and I doubt any other [facility] requiring further screening and/or treatment in the ED prior to admission by ED providers.   Final Clinical Impressions(s) / ED Diagnoses   Final diagnoses:  Postoperative surgical complication involving skin associated with non-dermatologic procedure, unspecified complication    New Prescriptions New Prescriptions   No medications on file     [name], PA-C 05/10/16 2036    [name], PA-C 05/10/16 2115    Pattricia Boss, MD 05/10/16 2302

## 2016-05-10 NOTE — ED Triage Notes (Signed)
Pt presents for a post op problem. Pt had a lympoma removed from above his right eye and presents today for bleeding that has not stopped since surgery. Pt denies any falls or additional trauma. Dr. Arlina Robes referred pt to the ED for further evaluation.

## 2016-05-10 NOTE — ED Triage Notes (Addendum)
Dr. Arlina Robes notified of patient arrival, labs ordered as requested

## 2016-06-03 NOTE — Discharge Summary (Signed)
Physician Discharge Summary  Patient ID: Randy Frazier MRN: [id number] DOB/AGE: [birth year] 83 y.o.  Admit date: 05/07/2016 Discharge date: 05/08/16  Admission Diagnoses: Periorbital Hematoma  Discharge Diagnoses:  Active Problems:   Periorbital hematoma   Discharged Condition: good  Hospital Course: Patient was admitted for observation s/p evacuation of hematoma. Patient had concerns of taking care of himself when he went home which was related prior to the evacuation at [facility] to the staff. The anesthesiologist related this to me which aided in the decision to have the patient admitted for observation. Patient was evaluated fully prior to going to the floor. Patient was comfortable, his dressings were dry with no drainage noted on them. Patient's VS were stable. Patient asked the physician again to go out to dinner, stating, "What's going on doctor, I thought I was taking you to dinner tonight." in front of the staff in the PACU, and was visibly upset when I informed him that it would not happen and asked multiple times.  Patient had a benign course during observation. No complaints of pain or discomfort during the observation period. Left strict instructions to call if any drainage through the dressing. Was not called overnight about this patient.   Consults: None  Significant Diagnostic Studies: none  Treatments: pain control  Discharge Exam: Blood pressure (!) 160/73, pulse 84, temperature 98.1 F (36.7 C), temperature source Oral, resp. rate 16, weight 86.6 kg (191 lb), SpO2 97 %. Per report from nurse, patient stable for discharge. VS stable. Spoke to patient before he left the facility to reiterate what had been done during the surgery and that he had a common complication of surgery which is bleeding and that his healing though delayed would result in an eventual positive outcome but that he would need to be patient. I also pointed out to the patient that when his eyelid  was opened by myself that his vision was fine. The patient was able to be reassured over the phone. Patient also related to me that he felt fine and was in no discomfort.  Patient spoke concerns of the billing concerning the surgery and the admission to the staff, but did not relate this to me when I spoke to him.  Disposition: To clinic to see Dr. Kristeen Miss  Allergies as of 05/08/2016      Reactions   Penicillins       Medication List    TAKE these medications   acetaminophen-codeine 300-30 MG tablet Commonly known as:  TYLENOL #3 Take 1 tablet by mouth every 4 (four) hours as needed for moderate pain.   amLODipine 10 MG tablet Commonly known as:  NORVASC TAKE 1 TABLET BY MOUTH EVERY DAY   aspirin EC 81 MG tablet Take 81 mg by mouth.   atorvastatin 10 MG tablet Commonly known as:  LIPITOR TAKE 1 TABLET BY MOUTH EVERY DAY   clopidogrel 75 MG tablet Commonly known as:  PLAVIX Take 1 tablet (75 mg total) by mouth daily. Hold until Friday 05/09/16 What changed:  additional instructions   Fluoxetine HCl (PMDD) 20 MG Caps Take 20 mg by mouth daily.   gabapentin 300 MG capsule Commonly known as:  NEURONTIN Take 300 mg by mouth 3 (three) times daily.   insulin glargine 100 UNIT/ML injection Commonly known as:  LANTUS Inject 30 Units into the skin at bedtime.   lisinopril 20 MG tablet Commonly known as:  PRINIVIL,ZESTRIL Take 20 mg by mouth daily.   Melatonin 500 MCG Tbdp Take  500 mcg by mouth at bedtime.   metFORMIN 500 MG tablet Commonly known as:  GLUCOPHAGE Take 1,000 mg by mouth 2 (two) times daily with a meal.   metoprolol tartrate 25 MG tablet Commonly known as:  LOPRESSOR Take 25 mg by mouth 2 (two) times daily.   omeprazole 40 MG capsule Commonly known as:  PRILOSEC Take 40 mg by mouth daily.      Follow-up Information    Clista Bernhardt, MD On 05/08/2016.   Specialty:  Oculoplastics Ophthalmology Why:  at [employer] information: [place] [address] [phone]           Signed: Clista Bernhardt 06/03/2016, 8:47 AM

## 2016-06-13 ENCOUNTER — Other Ambulatory Visit: Payer: Self-pay | Admitting: Cardiovascular Disease

## 2016-08-24 NOTE — Progress Notes (Signed)
Cardiology Office Note  Date:  08/27/2016   ID:  Randy Frazier, Randy Frazier January 04, 1928, MRN [id number]  PCP:  Randy Castle, MD   Chief Complaint  Patient presents with  . other    6 month f/u no complaints today. Meds reviewed verbally with pt.    HPI:  Randy Frazier is a very pleasant 83 year-old gentleman with a history of  coronary artery disease,  bypass surgery in 2009 at [facility],  poorly controlled diabetes,  hyperlipidemia  hypertension  stroke who presents for routine followup of his coronary artery disease  Currently taking insulin for diabetes, managed by endocrinology He is bothered by frequent checking with his glucometer Denies any chest pain concerning for angina Active at baseline Hemoglobin A1c previously 9, now 7.4  Lab work reviewed with him in detail Total chol 121, LDL 40 Hemoglobin A1c  7.4 CR 1.4  Previously stopped his fluoxetine on his own abruptly, did not have side effects Does not feel that he is depressed Reports that he has a "lady friend"  Previously with bladder control issues, some incontinence Has a prostate pill, stopped on his own  He does not want to increase his Lipitor as his cramping gets worse  EKG on today's visit shows normal sinus rhythm with rate 65 bpm, right bundle branch block, left axis deviation  Other past medical history Prior stroke stroke he had word finding difficulty. He had depression after the loss of his wife.   Seen in the hospital emergency room 03/30/2013 for chest pain. Workup was essentially normal, negative cardiac enzymes, creatinine 1.54 with BUN of 20, normal LFTs. EKG was unchanged with right bundle branch block  Hemoglobin A1c 7.3, total cholesterol 127, HDL 37, LDL 60  [place]. He was admitted to a hospital there and had full workup including carotid ultrasound, head CT, MRI and echocardiogram. Presumably he was  placed on a telemetry monitor during his hospital admission. Cardiac CT and MRI showed left posterior temporal lobe stroke.  Carotid ultrasound showed mild plaquing bilaterally Echocardiogram ejection fraction 60-65%, mildly dilated left atrium, otherwise normal study  Cardiac catheterization notes from August 2009 shows a proximal left main lesion of 40%, proximal LAD lesion 60%, mid LAD lesion 95%, distal LAD lesion 70%, proximal circumflex and 60% followed by a 90% lesion with 99% proximal RCA disease, 70% distal RCA disease, 100% stenosis of the PDA vessel. He was transferred to [facility] for bypass surgery previous records are not available to [place] at this time.   Echocardiogram from December 2009 shows mild mitral regurgitation, mild pulmonic regurgitation, trace TR, otherwise normal LV size and systolic function with mild LVH and mild diastolic dysfunction.  PMH:   has a past medical history of Arrhythmia; BPH (benign prostatic hyperplasia); Coronary artery disease (2009); CVA (cerebral infarction) (2014); Diabetes mellitus without complication ([place]); Enlarged prostate; GERD (gastroesophageal reflux disease); Hyperlipidemia; Hypertension; and Renal disorder.  PSH:    Past Surgical History:  Procedure Laterality Date  . cataract surgery Bilateral   . CORONARY ARTERY BYPASS GRAFT  2009   CABG X 3 @ [facility]  . TEAR DUCT PROBING Right 05/07/2016   Procedure: Evacuation of hematoma, right peri-orbital bleed.;  Surgeon: Clista Bernhardt, MD;  Location: [place];  Service: Ophthalmology;  Laterality: Right;    Current Outpatient Prescriptions  Medication Sig Dispense Refill  . acetaminophen-codeine (TYLENOL #3) 300-30 MG tablet Take 1 tablet by mouth  every 4 (four) hours as needed for moderate pain. 30 tablet 0  . amLODipine (NORVASC) 10 MG tablet TAKE 1 TABLET BY MOUTH EVERY DAY 30 tablet 6  . aspirin EC 81 MG tablet Take 81 mg by mouth.    [name] atorvastatin (LIPITOR) 10 MG tablet TAKE 1 TABLET  BY MOUTH EVERY DAY 30 tablet 0  . clopidogrel (PLAVIX) 75 MG tablet TAKE 1 TABLET BY MOUTH EVERY DAY 30 tablet 0  . Fluoxetine HCl, PMDD, 20 MG CAPS Take 20 mg by mouth daily.    [name] gabapentin (NEURONTIN) 300 MG capsule Take 300 mg by mouth 3 (three) times daily.     . insulin glargine (LANTUS) 100 UNIT/ML injection Inject 30 Units into the skin at bedtime.     . insulin lispro (HUMALOG) 100 UNIT/ML injection Inject 20 Units into the skin 2 (two) times daily.    [name] lisinopril (PRINIVIL,ZESTRIL) 20 MG tablet Take 20 mg by mouth daily.     . Melatonin 500 MCG TBDP Take 500 mcg by mouth at bedtime.    . metFORMIN (GLUCOPHAGE) 500 MG tablet Take 1,000 mg by mouth 2 (two) times daily with a meal.     . metoprolol tartrate (LOPRESSOR) 25 MG tablet Take 25 mg by mouth 2 (two) times daily.      [name] omeprazole (PRILOSEC) 40 MG capsule Take 40 mg by mouth daily.       No current facility-administered medications for this visit.      Allergies:   Ibuprofen and Penicillins   Social History:  The patient  reports that he has never smoked. He has never used smokeless tobacco. He reports that he does not drink alcohol or use drugs.   Family History:   Family history is unknown by patient.    Review of Systems: Review of Systems  Constitutional: Negative.        Hungry, food cravings  Respiratory: Negative.   Cardiovascular: Negative.   Gastrointestinal: Negative.   Musculoskeletal: Negative.   Neurological: Negative.   Psychiatric/Behavioral: Negative.   All other systems reviewed and are negative.    PHYSICAL EXAM: VS:  BP 124/64 (BP Location: Left Arm, Patient Position: Sitting, Cuff Size: Normal)   Pulse 65   Ht 6' (1.829 m)   Wt 197 lb (89.4 kg)   BMI 26.72 kg/m  , BMI Body mass index is 26.72 kg/m. GEN: Well nourished, well developed, in no acute distress  HEENT: normal  Neck: no JVD, carotid bruits, or masses Cardiac: RRR; no murmurs, rubs, or gallops,no edema  Respiratory:  clear  to auscultation bilaterally, normal work of breathing GI: soft, nontender, nondistended, + BS MS: no deformity or atrophy  Skin: warm and dry, no rash Neuro:  Strength and sensation are intact Psych: euthymic mood, full affect  Recent Labs: 05/10/2016: ALT 34; BUN 19; Creatinine, Ser 1.41; Hemoglobin 12.8; Platelets 258; Potassium 4.0; Sodium 135    Lipid Panel Lab Results  Component Value Date   CHOL 116 02/23/2012   HDL 32 (L) 02/23/2012   LDLCALC 64 02/23/2012   TRIG 99 02/23/2012      Wt Readings from Last 3 Encounters:  08/27/16 197 lb (89.4 kg)  05/10/16 190 lb (86.2 kg)  05/07/16 191 lb (86.6 kg)       ASSESSMENT AND PLAN:  HYPERTENSION, BENIGN - Plan: EKG 12-Lead Blood pressure is well controlled on today's visit. No changes made to the medications.  Coronary artery disease involving native coronary artery of native  heart without angina pectoris - Plan: EKG 12-Lead Currently with no symptoms of angina. No further workup at this time. Continue current medication regimen.  Mixed hyperlipidemia Cholesterol is at goal on the current lipid regimen. No changes to the medications were made.  Type 2 diabetes mellitus with other circulatory complication, with long-term current use of insulin ([place]) Long discussion with him concerning his diet,  Encouraged him to follow low carbohydrate diet Stressed the importance of Compliance with his insulin Recent weight gain over the past year Hemoglobin A1c has come down in the past several months  Prior stroke Encouraged him to stay on his aspirin, aggressive lipid management   Total encounter time more than 25 minutes  Greater than 50% was spent in counseling and coordination of care with the patient   Disposition:   F/U  12 months   Orders Placed This Encounter  Procedures  . EKG 12-Lead     Signed, Esmond Plants, M.D., Ph.D. 08/27/2016  [place], [place]

## 2016-08-27 ENCOUNTER — Ambulatory Visit (INDEPENDENT_AMBULATORY_CARE_PROVIDER_SITE_OTHER): Payer: Medicare Other | Admitting: Cardiovascular Disease

## 2016-08-27 ENCOUNTER — Encounter: Payer: Self-pay | Admitting: Cardiovascular Disease

## 2016-08-27 VITALS — BP 124/64 | HR 65 | Ht 72.0 in | Wt 197.0 lb

## 2016-08-27 DIAGNOSIS — E1159 Type 2 diabetes mellitus with other circulatory complications: Secondary | ICD-10-CM

## 2016-08-27 DIAGNOSIS — I25718 Atherosclerosis of autologous vein coronary artery bypass graft(s) with other forms of angina pectoris: Secondary | ICD-10-CM

## 2016-08-27 DIAGNOSIS — N179 Acute kidney failure, unspecified: Secondary | ICD-10-CM

## 2016-08-27 DIAGNOSIS — Z951 Presence of aortocoronary bypass graft: Secondary | ICD-10-CM | POA: Diagnosis not present

## 2016-08-27 DIAGNOSIS — I2510 Atherosclerotic heart disease of native coronary artery without angina pectoris: Secondary | ICD-10-CM

## 2016-08-27 DIAGNOSIS — I10 Essential (primary) hypertension: Secondary | ICD-10-CM | POA: Diagnosis not present

## 2016-08-27 DIAGNOSIS — E782 Mixed hyperlipidemia: Secondary | ICD-10-CM | POA: Diagnosis not present

## 2016-08-27 DIAGNOSIS — I25118 Atherosclerotic heart disease of native coronary artery with other forms of angina pectoris: Secondary | ICD-10-CM | POA: Diagnosis not present

## 2016-08-27 DIAGNOSIS — I639 Cerebral infarction, unspecified: Secondary | ICD-10-CM | POA: Diagnosis not present

## 2016-08-27 DIAGNOSIS — I209 Angina pectoris, unspecified: Secondary | ICD-10-CM | POA: Diagnosis not present

## 2016-08-27 DIAGNOSIS — Z794 Long term (current) use of insulin: Secondary | ICD-10-CM

## 2016-08-27 NOTE — Patient Instructions (Addendum)

## 2016-10-16 IMAGING — CR DG CHEST 2V
1 series · 2 of 2 positions shown · non-contrast
Comparison: None available

CLINICAL DATA: Cough and shortness of breath.

EXAM:
CHEST  2 VIEW

[Series 1: dg chest 2 view · 0.14mm/px · 2 of 2 slices shown]
[im 1/2]
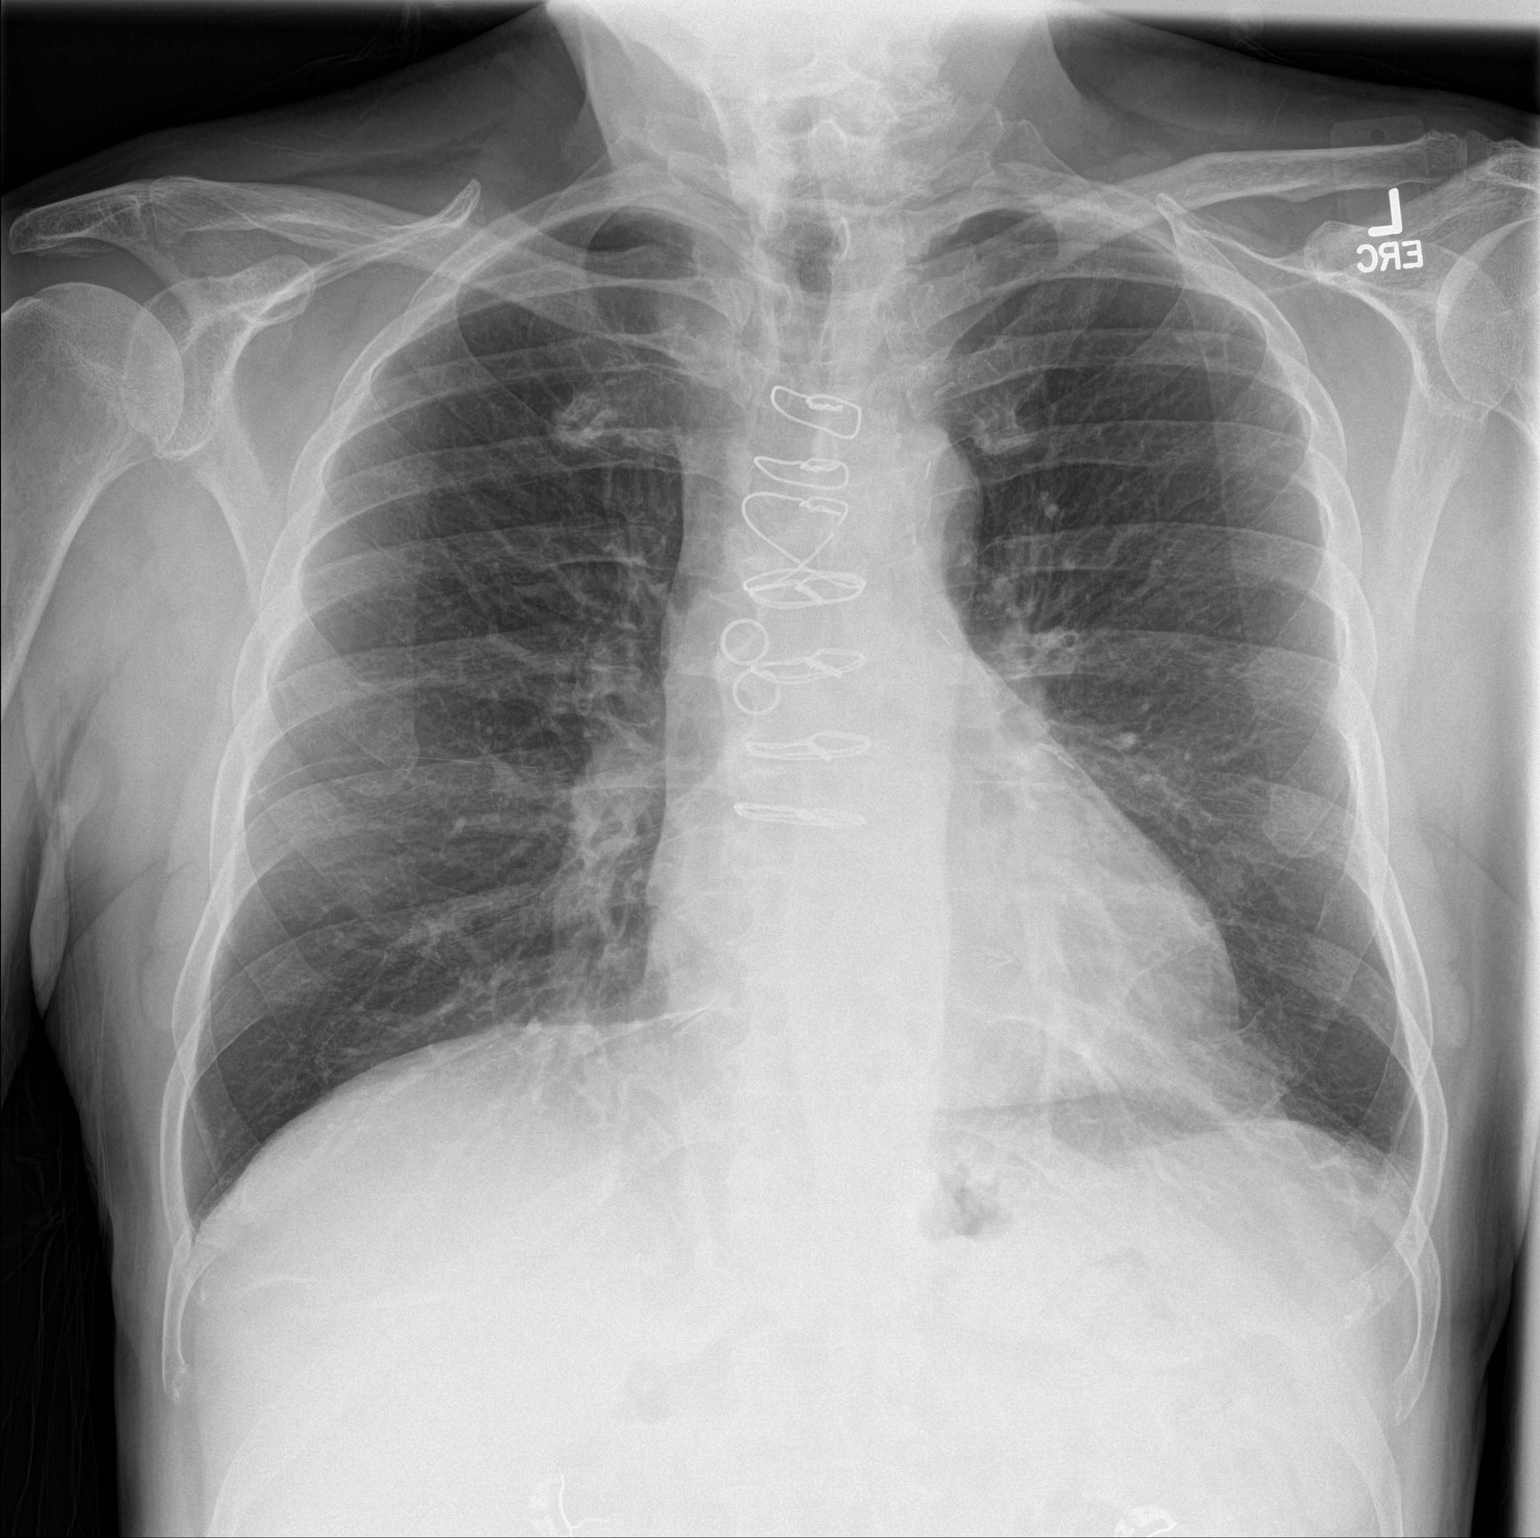
[im 2/2]
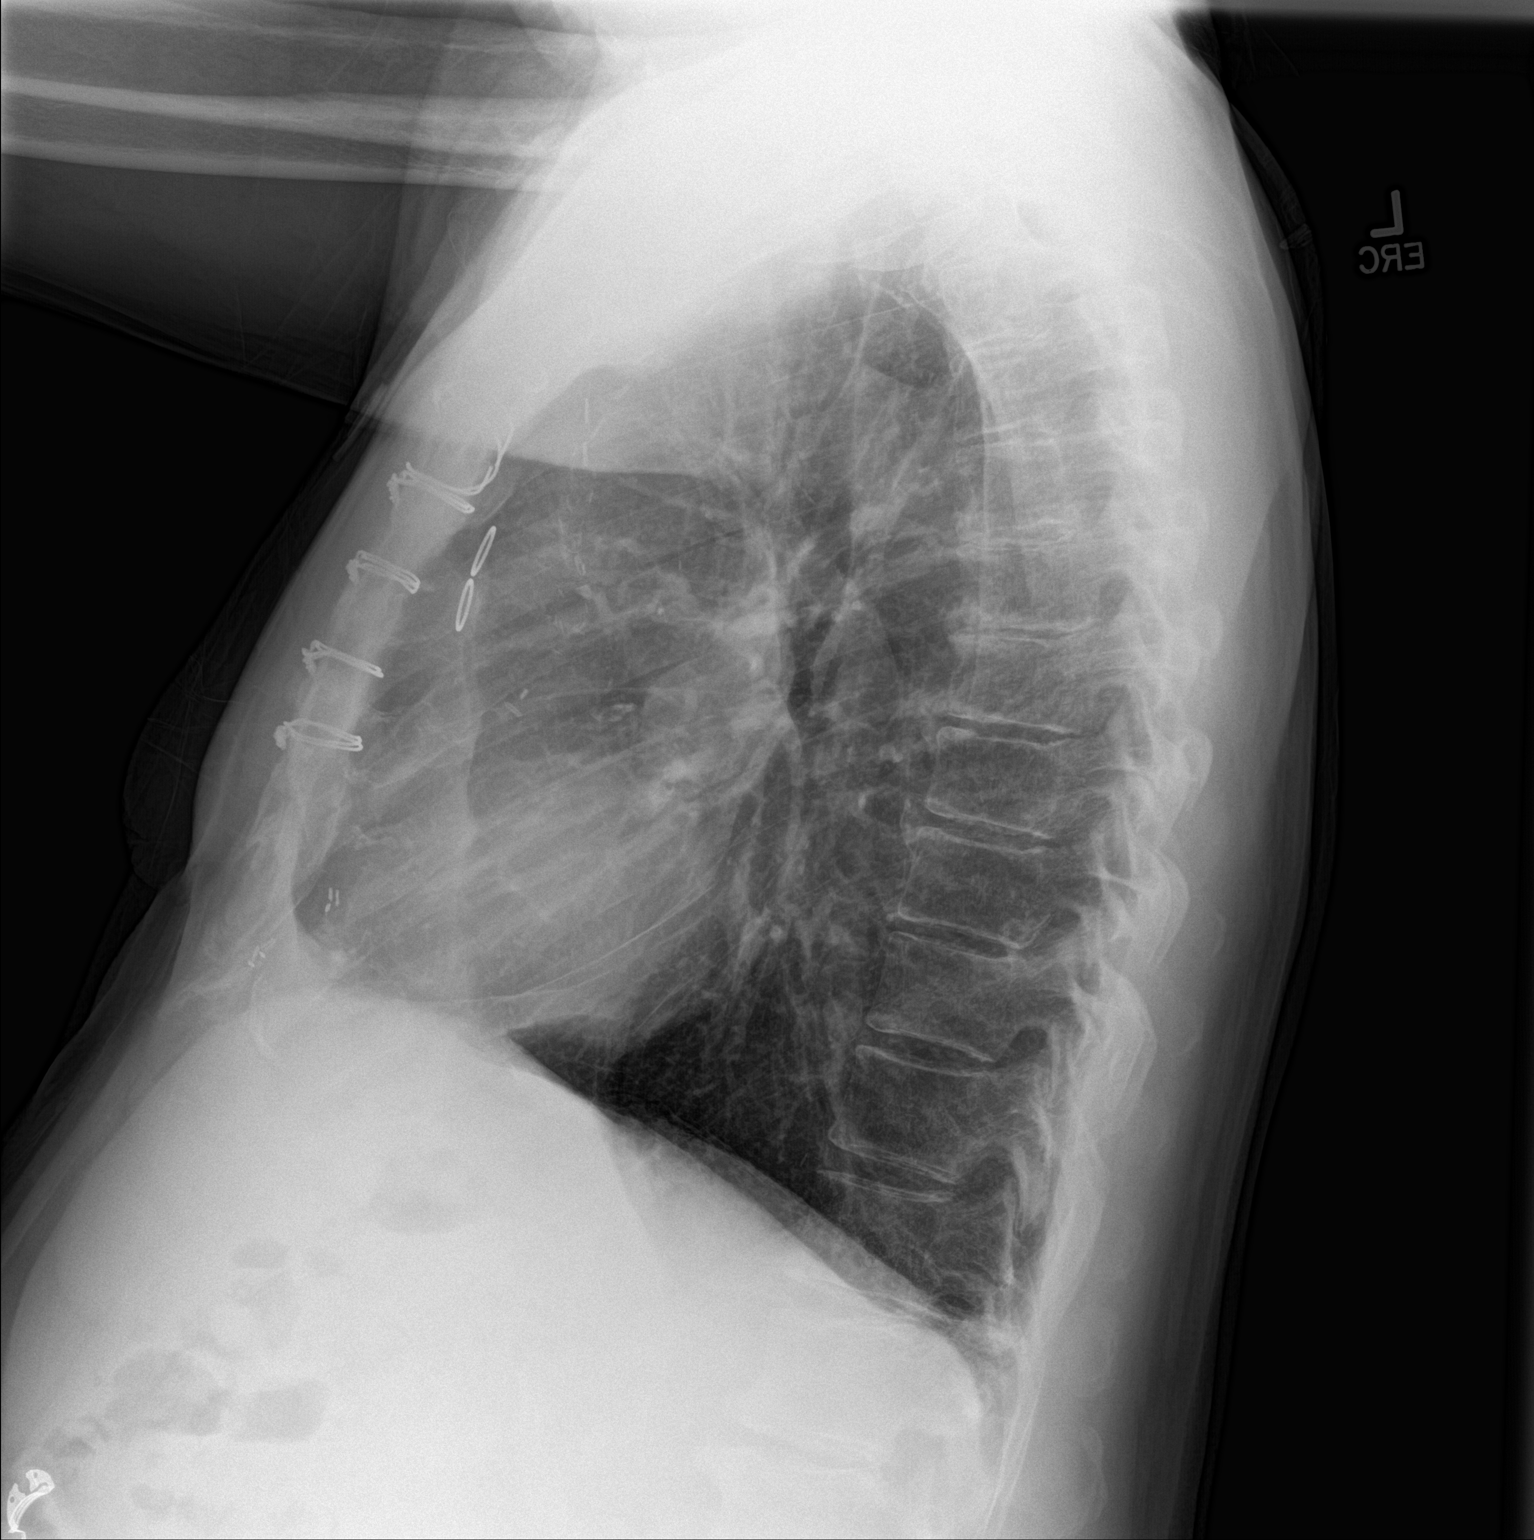

[2 of 2 positions shown; findings below may reference images not displayed]

FINDINGS: Normal heart size and mediastinal contours. The patient is status
post CABG. There is no edema, consolidation, effusion, or
pneumothorax. Mid thoracic degenerative disc disease. No acute
osseous finding.
IMPRESSION: No evidence of active disease.

## 2016-11-27 ENCOUNTER — Encounter (INDEPENDENT_AMBULATORY_CARE_PROVIDER_SITE_OTHER): Payer: Self-pay

## 2016-11-27 ENCOUNTER — Ambulatory Visit: Payer: Medicare Other | Admitting: Pharmacy Technician

## 2016-11-27 DIAGNOSIS — Z79899 Other long term (current) drug therapy: Secondary | ICD-10-CM

## 2016-11-28 NOTE — Progress Notes (Signed)
  Patient stated that he was in coverage gap.  Seeking medication assistance.  Told patient that [facility] could provide medication assistance for Lantus & Humalog but [facility] would be unable to provide medication assistance for his generic medications.  Patient stated that he doesn't need assistance with Lantus and Humalog, only his generic medications.  Patient stated that he has been approved to receive medication assistance for Lantus through 03/13/17 and is purchasing relion from [place] in place of the Humalog.  Referred patient to [facility] because they can provide medications at a reduced cost to Medicare patients.  Patient has appointment at [facility] on 12/25/16 at 2:00pm.    Patient stated that he has been depressed since his wife passed away 6 years ago.  Referred patient to [facility] and made patient aware of the walk-in hours at both facilities.  Also, provided patient with information about grief counseling from Hospice.    [facility]

## 2017-05-19 ENCOUNTER — Other Ambulatory Visit: Payer: Self-pay | Admitting: Cardiovascular Disease

## 2017-07-10 ENCOUNTER — Ambulatory Visit: Payer: Self-pay

## 2017-08-05 ENCOUNTER — Ambulatory Visit: Payer: Self-pay | Admitting: Urology

## 2017-08-19 ENCOUNTER — Other Ambulatory Visit: Payer: Self-pay | Admitting: Cardiovascular Disease

## 2018-04-16 DIAGNOSIS — L84 Corns and callosities: Secondary | ICD-10-CM | POA: Insufficient documentation

## 2018-11-28 NOTE — Progress Notes (Signed)
Cardiology Office Note  Date:  11/30/2018   ID:  Randy Frazier, Randy Frazier 06-28-1927, MRN [id number]  PCP:  Valera Castle, MD   Chief Complaint  Patient presents with  . Other    12 month follow up. Meds reviewed verbally with patient.     HPI:  Randy Frazier is a very pleasant 85 year-old gentleman with a history of  coronary artery disease,  bypass surgery in 2009 at [facility],   diabetes,  hyperlipidemia  hypertension  stroke who presents for routine followup of his coronary artery disease  Feels good Weight up, 194 No exercising Feels good, "I can't check glucose levels 4 times a day, I just can't"  No regular exercise program Used to bike, tried it recently legs hurt so he sold his bike Reports his diet is poor in general, eats what he wants, pizza  No chest pain concerning for angina Denies having shortness of breath on exertion  Reports having myalgias on anything more than Lipitor 10 daily  Lab work reviewed with him in detail Total chol 141, LDL 81 Hemoglobin A1c  7.7 CR 1.3  Previously stopped his fluoxetine on his own abruptly, did not have side effects Does not feel that he is depressed  EKG on today's visit shows normal sinus rhythm with rate 56 bpm, right bundle branch block, left axis deviation No significant change compared to EKG from 2018  Other past medical history Prior stroke stroke he had word finding difficulty. He had depression after the loss of his wife.   Cardiac catheterization notes from August 2009 shows a proximal left main lesion of 40%, proximal LAD lesion 60%, mid LAD lesion 95%, distal LAD lesion 70%, proximal circumflex and 60% followed by a 90% lesion with 99% proximal RCA disease, 70% distal RCA disease, 100% stenosis of the PDA vessel. He was transferred to [facility] for bypass surgery previous records are not available to [place] at this time.     PMH:   has a past medical history of Arrhythmia, BPH (benign prostatic  hyperplasia), Coronary artery disease (2009), CVA (cerebral infarction) (2014), Diabetes mellitus without complication ([place]), Enlarged prostate, GERD (gastroesophageal reflux disease), Hyperlipidemia, Hypertension, and Renal disorder.  PSH:    Past Surgical History:  Procedure Laterality Date  . cataract surgery Bilateral   . CORONARY ARTERY BYPASS GRAFT  2009   CABG X 3 @ [facility]  . TEAR DUCT PROBING Right 05/07/2016   Procedure: Evacuation of hematoma, right peri-orbital bleed.;  Surgeon: Clista Bernhardt, MD;  Location: [place];  Service: Ophthalmology;  Laterality: Right;    Current Outpatient Medications  Medication Sig Dispense Refill  . acetaminophen-codeine (TYLENOL #3) 300-30 MG tablet Take 1 tablet by mouth every 4 (four) hours as needed for moderate pain. 30 tablet 0  . amLODipine (NORVASC) 10 MG tablet TAKE 1 TABLET BY MOUTH EVERY DAY 30 tablet 6  . aspirin EC 81 MG tablet Take 81 mg by mouth.    [name] atorvastatin (LIPITOR) 10 MG tablet TAKE 1 TABLET BY MOUTH EVERY DAY 30 tablet 0  . clopidogrel (PLAVIX) 75 MG tablet TAKE 1 TABLET(75 MG) BY MOUTH DAILY 90 tablet 0  . Fluoxetine HCl, PMDD, 20 MG CAPS Take 20 mg by mouth daily.    [name] gabapentin (NEURONTIN) 300 MG capsule Take 300 mg by mouth 3 (three) times daily.     . insulin glargine (LANTUS) 100 UNIT/ML injection Inject 30 Units into the skin at bedtime.     [name]  insulin lispro (HUMALOG) 100 UNIT/ML injection Inject 20 Units into the skin 2 (two) times daily.    [name] lisinopril (PRINIVIL,ZESTRIL) 20 MG tablet Take 20 mg by mouth daily.     . Melatonin 500 MCG TBDP Take 500 mcg by mouth at bedtime.    . metFORMIN (GLUCOPHAGE) 500 MG tablet Take 1,000 mg by mouth 2 (two) times daily with a meal.     . metoprolol tartrate (LOPRESSOR) 25 MG tablet Take 25 mg by mouth 2 (two) times daily.      [name] omeprazole (PRILOSEC) 40 MG capsule Take 40 mg by mouth daily.       No current facility-administered medications for this visit.      Allergies:    Ibuprofen and Penicillins   Social History:  The patient  reports that he has never smoked. He has never used smokeless tobacco. He reports that he does not drink alcohol or use drugs.   Family History:   Family history is unknown by patient.    Review of Systems: Review of Systems  Constitutional: Negative.        Hungry, food cravings  Respiratory: Negative.   Cardiovascular: Negative.   Gastrointestinal: Negative.   Musculoskeletal: Negative.   Neurological: Negative.   Psychiatric/Behavioral: Negative.   All other systems reviewed and are negative.   PHYSICAL EXAM: VS:  BP (!) 156/70 (BP Location: Left Arm, Patient Position: Sitting, Cuff Size: Normal)   Pulse (!) 56   Ht 6' (1.829 m)   Wt 194 lb (88 kg)   BMI 26.31 kg/m  , BMI Body mass index is 26.31 kg/m. Constitutional:  oriented to person, place, and time. No distress.  HENT:  Head: Grossly normal Eyes:  no discharge. No scleral icterus.  Neck: No JVD, no carotid bruits  Cardiovascular: Regular rate and rhythm, no murmurs appreciated Pulmonary/Chest: Clear to auscultation bilaterally, no wheezes or rails Abdominal: Soft.  no distension.  no tenderness.  Musculoskeletal: Normal range of motion Neurological:  normal muscle tone. Coordination normal. No atrophy Skin: Skin warm and dry Psychiatric: normal affect, pleasant  Recent Labs: No results found for requested labs within last 8760 hours.    Lipid Panel Lab Results  Component Value Date   CHOL 116 02/23/2012   HDL 32 (L) 02/23/2012   LDLCALC 64 02/23/2012   TRIG 99 02/23/2012      Wt Readings from Last 3 Encounters:  11/30/18 194 lb (88 kg)  08/27/16 197 lb (89.4 kg)  05/10/16 190 lb (86.2 kg)     ASSESSMENT AND PLAN:  HYPERTENSION, BENIGN - Plan: EKG 12-Lead Blood pressure is well controlled on today's visit. No changes made to the medications.  Coronary artery disease involving native coronary artery of native heart without angina  pectoris - Plan: EKG 12-Lead Denies any anginal symptoms Recommend he restart his exercise program  Mixed hyperlipidemia Given weight gain, numbers above goal Recommended he start Zetia in addition to Lipitor 10 He had myalgias on high-dose Lipitor  Type 2 diabetes mellitus with other circulatory complication, with long-term current use of insulin ([place]) Recommended a more aggressive diet Currently eats what he wants, lots of pizza Weight has been trending higher Does not like to check his sugars 4 times a day but would like the sensor to help avoid checking  Prior stroke No further TIA or stroke symptoms On aspirin, stressed importance of aggressive lipid control   Total encounter time more than 25 minutes  Greater than 50% was spent in  counseling and coordination of care with the patient   Disposition:   F/U  12 months   No orders of the defined types were placed in this encounter.    Signed, Esmond Plants, M.D., Ph.D. 11/30/2018  [place], [place]

## 2018-11-30 ENCOUNTER — Encounter: Payer: Self-pay | Admitting: Cardiovascular Disease

## 2018-11-30 ENCOUNTER — Other Ambulatory Visit: Payer: Self-pay

## 2018-11-30 ENCOUNTER — Ambulatory Visit (INDEPENDENT_AMBULATORY_CARE_PROVIDER_SITE_OTHER): Payer: Medicare Other | Admitting: Cardiovascular Disease

## 2018-11-30 VITALS — BP 156/70 | HR 56 | Ht 72.0 in | Wt 194.0 lb

## 2018-11-30 DIAGNOSIS — E782 Mixed hyperlipidemia: Secondary | ICD-10-CM

## 2018-11-30 DIAGNOSIS — I25118 Atherosclerotic heart disease of native coronary artery with other forms of angina pectoris: Secondary | ICD-10-CM

## 2018-11-30 DIAGNOSIS — I639 Cerebral infarction, unspecified: Secondary | ICD-10-CM

## 2018-11-30 DIAGNOSIS — I10 Essential (primary) hypertension: Secondary | ICD-10-CM | POA: Diagnosis not present

## 2018-11-30 MED ORDER — EZETIMIBE 10 MG PO TABS: 10.0000 mg | ORAL_TABLET | Freq: Every day | ORAL | 3 refills | Status: DC

## 2018-11-30 NOTE — Patient Instructions (Addendum)
Medication Instructions:  Please start zetia 10 mg one a day for cholesterol  If you need a refill on your cardiac medications before your next appointment, please call your pharmacy.    Lab work: No new labs needed   If you have labs (blood work) drawn today and your tests are completely normal, you will receive your results only by: [name] MyChart Message (if you have MyChart) OR . A paper copy in the mail If you have any lab test that is abnormal or we need to change your treatment, we will call you to review the results.   Testing/Procedures: No new testing needed   Follow-Up: At [facility], you and your health needs are our priority.  As part of our continuing mission to provide you with exceptional heart care, we have created designated Provider Care Teams.  These Care Teams include your primary Cardiologist (physician) and Advanced Practice Providers (APPs -  Physician Assistants and Nurse Practitioners) who all work together to provide you with the care you need, when you need it.  . You will need a follow up appointment in 12 months .   Please call our office 2 months in advance to schedule this appointment.    . Providers on your designated Care Team:   . Murray Hodgkins, NP . Christell Faith, PA-C . Marrianne Mood, PA-C  Any Other Special Instructions Will Be Listed Below (If Applicable).  For educational health videos Log in to : [email or web address] Or : [email or web address], password : [email or web address]

## 2019-05-30 ENCOUNTER — Other Ambulatory Visit: Payer: Self-pay

## 2019-05-30 ENCOUNTER — Ambulatory Visit (INDEPENDENT_AMBULATORY_CARE_PROVIDER_SITE_OTHER): Payer: Medicare Other | Admitting: Dermatology

## 2019-05-30 DIAGNOSIS — D692 Other nonthrombocytopenic purpura: Secondary | ICD-10-CM

## 2019-05-30 DIAGNOSIS — L720 Epidermal cyst: Secondary | ICD-10-CM

## 2019-05-30 DIAGNOSIS — D229 Melanocytic nevi, unspecified: Secondary | ICD-10-CM

## 2019-05-30 DIAGNOSIS — L814 Other melanin hyperpigmentation: Secondary | ICD-10-CM

## 2019-05-30 DIAGNOSIS — L820 Inflamed seborrheic keratosis: Secondary | ICD-10-CM | POA: Diagnosis not present

## 2019-05-30 DIAGNOSIS — L918 Other hypertrophic disorders of the skin: Secondary | ICD-10-CM

## 2019-05-30 DIAGNOSIS — L821 Other seborrheic keratosis: Secondary | ICD-10-CM

## 2019-05-30 DIAGNOSIS — L570 Actinic keratosis: Secondary | ICD-10-CM | POA: Diagnosis not present

## 2019-05-30 DIAGNOSIS — Z85828 Personal history of other malignant neoplasm of skin: Secondary | ICD-10-CM | POA: Diagnosis not present

## 2019-05-30 NOTE — Progress Notes (Signed)
   Follow-Up Visit   Subjective  Randy Frazier is a 86 y.o. male who presents for the following: lesions (of the scalp - crusted, scabbed, irritated. Patient would like to have them treated. ).  The following portions of the chart were reviewed this encounter and updated as appropriate: Tobacco  Allergies  Meds  Problems  Med Hx  Surg Hx  Fam Hx     Review of Systems: No other skin or systemic complaints.  Objective  Well appearing patient in no apparent distress; mood and affect are within normal limits.  A focused examination was performed including face and scalp. Relevant physical exam findings are noted in the Assessment and Plan.  Objective  Scalp x 17 (17): Erythematous thin papules/macules with gritty scale.   Objective  L nasal tip: Firm SQ nodule 0.4 cm   Objective  Scalp: Well healed scar  Objective  Scalp (16): Erythematous keratotic or waxy stuck-on papule or plaque.   Assessment & Plan  AK (actinic keratosis) (17) Scalp x 17  Destruction of lesion - Scalp x 17 Complexity: simple   Destruction method: cryotherapy   Informed consent: discussed and consent obtained   Timeout:  patient name, date of birth, surgical site, and procedure verified Lesion destroyed using liquid nitrogen: Yes   Region frozen until ice ball extended beyond lesion: Yes   Outcome: patient tolerated procedure well with no complications   Post-procedure details: wound care instructions given    Epidermal cyst L nasal tip  Benign, observe.    History of skin cancer Scalp  Treated by another physician years ago   Inflamed seborrheic keratosis (16) Scalp  Destruction of lesion - Scalp Complexity: simple   Destruction method: cryotherapy   Informed consent: discussed and consent obtained   Timeout:  patient name, date of birth, surgical site, and procedure verified Lesion destroyed using liquid nitrogen: Yes   Region frozen until ice ball extended beyond lesion:  Yes   Outcome: patient tolerated procedure well with no complications   Post-procedure details: wound care instructions given    Skin tag Left Breast  Seborrheic Keratoses - Stuck-on, waxy, tan-brown papules and plaques  - Discussed benign etiology and prognosis. - Observe - Call for any changes  Lentigines - Scattered tan macules - Discussed due to sun exposure - Benign, observe - Call for any changes  Purpura - Violaceous macules and patches - Benign - Related to age, sun damage and/or use of blood thinners - Observe - Can use OTC arnica containing moisturizer such as Dermend Bruise Formula if desired - Call for worsening or other concerns  Melanocytic Nevi - Tan-brown and/or pink-flesh-colored symmetric macules and papules - Benign appearing on exam today - Observation - Call clinic for new or changing moles - Recommend daily use of broad spectrum spf 30+ sunscreen to sun-exposed areas.   Return in about 2 months (around 07/30/2019).   Luther Redo, CMA, am acting as scribe for Sarina Ser, MD .  Documentation: I have reviewed the above documentation for accuracy and completeness, and I agree with the above.  Sarina Ser, MD

## 2019-05-31 ENCOUNTER — Encounter: Payer: Self-pay | Admitting: Dermatology

## 2019-08-08 ENCOUNTER — Other Ambulatory Visit: Payer: Self-pay

## 2019-08-08 ENCOUNTER — Ambulatory Visit (INDEPENDENT_AMBULATORY_CARE_PROVIDER_SITE_OTHER): Payer: Medicare Other | Admitting: Dermatology

## 2019-08-08 DIAGNOSIS — L578 Other skin changes due to chronic exposure to nonionizing radiation: Secondary | ICD-10-CM

## 2019-08-08 DIAGNOSIS — L738 Other specified follicular disorders: Secondary | ICD-10-CM | POA: Diagnosis not present

## 2019-08-08 DIAGNOSIS — L570 Actinic keratosis: Secondary | ICD-10-CM

## 2019-08-08 DIAGNOSIS — L820 Inflamed seborrheic keratosis: Secondary | ICD-10-CM

## 2019-08-08 NOTE — Progress Notes (Signed)
   Follow-Up Visit   Subjective  Randy Frazier is a 86 y.o. male who presents for the following: Follow-up.  Patient here today for 3 month AK and ISK follow up.  The following portions of the chart were reviewed this encounter and updated as appropriate:  Tobacco  Allergies  Meds  Problems  Med Hx  Surg Hx  Fam Hx      Review of Systems:  No other skin or systemic complaints except as noted in HPI or Assessment and Plan.  Objective  Well appearing patient in no apparent distress; mood and affect are within normal limits.  A focused examination was performed including extremities, including the arms, hands, fingers, and fingernails and the legs, feet, toes, and toenails and face, scalp. Relevant physical exam findings are noted in the Assessment and Plan.  Objective  Scalp x 16 (16): Erythematous thin papules/macules with gritty scale.   Objective  Scalp, ear x 17 (17): Erythematous keratotic or waxy stuck-on papule or plaque.   Objective  Left nose: Yellow papule   Assessment & Plan    AK (actinic keratosis) (16) Scalp x 16  Destruction of lesion - Scalp x 16 Complexity: simple   Destruction method: cryotherapy   Informed consent: discussed and consent obtained   Timeout:  patient name, date of birth, surgical site, and procedure verified Lesion destroyed using liquid nitrogen: Yes   Region frozen until ice ball extended beyond lesion: Yes   Outcome: patient tolerated procedure well with no complications   Post-procedure details: wound care instructions given    Inflamed seborrheic keratosis (17) Scalp, ear x 17  Destruction of lesion - Scalp, ear x 17 Complexity: simple   Destruction method: cryotherapy   Informed consent: discussed and consent obtained   Timeout:  patient name, date of birth, surgical site, and procedure verified Lesion destroyed using liquid nitrogen: Yes   Region frozen until ice ball extended beyond lesion: Yes   Outcome:  patient tolerated procedure well with no complications   Post-procedure details: wound care instructions given    Sebaceous hyperplasia Left nose  Benign, observe.   Will plan ED on follow up in fall.   Actinic Damage - diffuse scaly erythematous macules with underlying dyspigmentation - Recommend daily broad spectrum sunscreen SPF 30+ to sun-exposed areas, reapply every 2 hours as needed.  - Call for new or changing lesions.  Return in about 6 weeks (around 09/19/2019) for PDT to scalp, 4 months with Dr. Nehemiah Massed for [place] .  Graciella Belton, RMA, am acting as scribe for Sarina Ser, MD . Documentation: I have reviewed the above documentation for accuracy and completeness, and I agree with the above.  Sarina Ser, MD

## 2019-08-08 NOTE — Patient Instructions (Signed)
Recommend daily broad spectrum sunscreen SPF 30+ to sun-exposed areas, reapply every 2 hours as needed. Call for new or changing lesions.  Cryotherapy Aftercare  . Wash gently with soap and water everyday.   [name] Apply Vaseline and Band-Aid daily until healed.  Levulan/PDT Treatment Common Side Effects  - Burning/stinging, which may be severe and last up to 24-72 hours after your treatment  - Redness, swelling and/or peeling which may last up to 4 weeks  - Scaling/crusting which may last up to 2 weeks  - Sun sensitivity (you MUST avoid sun exposure for 48-72 hours after treatment)  Care Instructions  - Okay to wash with soap and water and shampoo as normal  - If needed, you can do a cold compress (ex. Ice packs) for comfort  - If okay with your Primary Doctor, you may use analgesics such as Tylenol every 4-6 hours, not to exceed recommended dose  - You may apply Cerave Healing Ointment, Vaseline or Aquaphor  - If you have a lot of swelling you may take a Benadryl to help with this (this may cause drowsiness)  Sun Precautions  - Wear a wide brim hat for the next week if outside  - Wear a sunblock with zinc or titanium dioxide at least SPF 50 daily   We will recheck you in 10-12 weeks. If any problems, please call the office and ask to speak with a nurse.

## 2019-08-11 ENCOUNTER — Encounter: Payer: Self-pay | Admitting: Dermatology

## 2019-09-19 ENCOUNTER — Ambulatory Visit: Payer: Medicare Other

## 2019-12-02 NOTE — Progress Notes (Signed)
Cardiology Office Note  Date:  12/05/2019   ID:  Randy Frazier, Randy Frazier August 17, 1927, MRN [id number]  PCP:  Valera Castle, MD   Chief Complaint  Patient presents with  . Other    12 month follow up. Patient c/o cramps in legs at night. patient repeatedly stated "im going to stop taking all my medicine"  meds reviewed verbally with patient.     HPI:  Randy Frazier is a very pleasant 86 year-old gentleman with a history of  coronary artery disease,  bypass surgery in 2009 at [facility],   diabetes,  hyperlipidemia  hypertension  stroke who presents for routine followup of his coronary artery disease  In follow-up today denies any new changes Active doing wedding functions No regular exercise program Lots of leg cramping, night time Previously reported myalgias on anything more than Lipitor 10  Denies palpitations No significant tachycardia No shortness of breath on exertion No chest pain concerning for angina  Previously he used to bike, hurt his leg so he sold the bike Previously reported having poor diet, pizza  Bothered by having to give himself insulin  Lab work reviewed with him in detail Total chol 119, LDL 57  Hemoglobin A1c  7.7 down to 6.9 CR 1.2  EKG on today's visit reviewed by myself shows normal sinus rhythm with rate 63 bpm PVCs right bundle branch block  Other past medical history Prior stroke stroke he had word finding difficulty. He had depression after the loss of his wife.   Cardiac catheterization notes from August 2009 shows a proximal left main lesion of 40%, proximal LAD lesion 60%, mid LAD lesion 95%, distal LAD lesion 70%, proximal circumflex and 60% followed by a 90% lesion with 99% proximal RCA disease, 70% distal RCA disease, 100% stenosis of the PDA vessel. He was transferred to [facility] for bypass surgery previous records are not available to [place] at this time.     PMH:   has a past medical history of Arrhythmia, BPH (benign  prostatic hyperplasia), Coronary artery disease (2009), CVA (cerebral infarction) (2014), Diabetes mellitus without complication ([place]), Enlarged prostate, GERD (gastroesophageal reflux disease), Hyperlipidemia, Hypertension, and Renal disorder.  PSH:    Past Surgical History:  Procedure Laterality Date  . cataract surgery Bilateral   . CORONARY ARTERY BYPASS GRAFT  2009   CABG X 3 @ [facility]  . TEAR DUCT PROBING Right 05/07/2016   Procedure: Evacuation of hematoma, right peri-orbital bleed.;  Surgeon: Clista Bernhardt, MD;  Location: [place];  Service: Ophthalmology;  Laterality: Right;    Current Outpatient Medications  Medication Sig Dispense Refill  . acetaminophen-codeine (TYLENOL #3) 300-30 MG tablet Take 1 tablet by mouth every 4 (four) hours as needed for moderate pain. 30 tablet 0  . amLODipine (NORVASC) 10 MG tablet TAKE 1 TABLET BY MOUTH EVERY DAY 30 tablet 6  . aspirin EC 81 MG tablet Take 81 mg by mouth.    [name] atorvastatin (LIPITOR) 10 MG tablet TAKE 1 TABLET BY MOUTH EVERY DAY 30 tablet 0  . clopidogrel (PLAVIX) 75 MG tablet TAKE 1 TABLET(75 MG) BY MOUTH DAILY 90 tablet 0  . ezetimibe (ZETIA) 10 MG tablet Take 1 tablet (10 mg total) by mouth daily. 90 tablet 3  . Fluoxetine HCl, PMDD, 20 MG CAPS Take 20 mg by mouth daily.    [name] gabapentin (NEURONTIN) 300 MG capsule Take 300 mg by mouth 3 (three) times daily.     . insulin glargine (LANTUS) 100 UNIT/ML  injection Inject 30 Units into the skin at bedtime.     . insulin lispro (HUMALOG) 100 UNIT/ML injection Inject 20 Units into the skin 2 (two) times daily.    [name] lisinopril (PRINIVIL,ZESTRIL) 20 MG tablet Take 20 mg by mouth daily.     . Melatonin 500 MCG TBDP Take 500 mcg by mouth at bedtime.    . metFORMIN (GLUCOPHAGE) 500 MG tablet Take 1,000 mg by mouth 2 (two) times daily with a meal.     . metoprolol tartrate (LOPRESSOR) 25 MG tablet Take 25 mg by mouth 2 (two) times daily.      [name] omeprazole (PRILOSEC) 40 MG capsule Take 40 mg by  mouth daily.       No current facility-administered medications for this visit.     Allergies:   Ibuprofen and Penicillins   Social History:  The patient  reports that he has never smoked. He has never used smokeless tobacco. He reports that he does not drink alcohol and does not use drugs.   Family History:   Family history is unknown by patient.    Review of Systems: Review of Systems  Constitutional: Negative.        Hungry, food cravings  HENT: Negative.   Respiratory: Negative.   Cardiovascular: Negative.   Gastrointestinal: Negative.   Musculoskeletal: Negative.   Neurological: Negative.   Psychiatric/Behavioral: Negative.   All other systems reviewed and are negative.   PHYSICAL EXAM: VS:  BP 126/66 (BP Location: Left Arm, Patient Position: Sitting, Cuff Size: Normal)   Pulse 63   Ht 6' (1.829 m)   Wt 189 lb (85.7 kg)   SpO2 96%   BMI 25.63 kg/m  , BMI Body mass index is 25.63 kg/m. Constitutional:  oriented to person, place, and time. No distress.  HENT:  Head: Grossly normal Eyes:  no discharge. No scleral icterus.  Neck: No JVD, no carotid bruits  Cardiovascular: Regular rate and rhythm, no murmurs appreciated Pulmonary/Chest: Clear to auscultation bilaterally, no wheezes or rails Abdominal: Soft.  no distension.  no tenderness.  Musculoskeletal: Normal range of motion Neurological:  normal muscle tone. Coordination normal. No atrophy Skin: Skin warm and dry Psychiatric: normal affect, pleasant  Recent Labs: No results found for requested labs within last 8760 hours.    Lipid Panel Lab Results  Component Value Date   CHOL 116 02/23/2012   HDL 32 (L) 02/23/2012   LDLCALC 64 02/23/2012   TRIG 99 02/23/2012    Wt Readings from Last 3 Encounters:  12/05/19 189 lb (85.7 kg)  11/30/18 194 lb (88 kg)  08/27/16 197 lb (89.4 kg)     ASSESSMENT AND PLAN:  HYPERTENSION, BENIGN -  Blood pressure is well controlled on today's visit. No changes made  to the medications.   Coronary artery disease involving native coronary artery of native heart without angina pectoris - Plan: EKG 12-Lead No regular exercise program, denies angina Cholesterol and diabetes numbers better control  Mixed hyperlipidemia Having myalgias on Lipitor, recommend he take this every other day continue Zetia daily  Type 2 diabetes mellitus with other circulatory complication, with long-term current use of insulin (HCC) A1c improving We have encouraged continued exercise, careful diet management in an effort to lose weight.  Prior stroke No further TIA or stroke symptoms On aspirin, stressed importance of aggressive lipid control   Total encounter time more than 25 minutes  Greater than 50% was spent in counseling and coordination of care with the patient  No orders of the defined types were placed in this encounter.    Signed, Esmond Plants, M.D., Ph.D. 12/05/2019  [place], [place]

## 2019-12-05 ENCOUNTER — Other Ambulatory Visit: Payer: Self-pay

## 2019-12-05 ENCOUNTER — Encounter: Payer: Self-pay | Admitting: Cardiovascular Disease

## 2019-12-05 ENCOUNTER — Ambulatory Visit (INDEPENDENT_AMBULATORY_CARE_PROVIDER_SITE_OTHER): Payer: Medicare Other | Admitting: Cardiovascular Disease

## 2019-12-05 VITALS — BP 126/66 | HR 63 | Ht 72.0 in | Wt 189.0 lb

## 2019-12-05 DIAGNOSIS — I639 Cerebral infarction, unspecified: Secondary | ICD-10-CM

## 2019-12-05 DIAGNOSIS — I10 Essential (primary) hypertension: Secondary | ICD-10-CM

## 2019-12-05 DIAGNOSIS — I25118 Atherosclerotic heart disease of native coronary artery with other forms of angina pectoris: Secondary | ICD-10-CM | POA: Diagnosis not present

## 2019-12-05 DIAGNOSIS — E782 Mixed hyperlipidemia: Secondary | ICD-10-CM | POA: Diagnosis not present

## 2019-12-05 MED ORDER — EZETIMIBE 10 MG PO TABS: 10.0000 mg | ORAL_TABLET | Freq: Every day | ORAL | 3 refills | Status: DC

## 2019-12-05 MED ORDER — ATORVASTATIN CALCIUM 10 MG PO TABS: 10.0000 mg | ORAL_TABLET | ORAL | 1 refills | Status: DC

## 2019-12-05 NOTE — Patient Instructions (Signed)
Medication Instructions:  Try atorvastatin 10 mg every other day Stay on zetia daily  If you need a refill on your cardiac medications before your next appointment, please call your pharmacy.    Lab work: No new labs needed   If you have labs (blood work) drawn today and your tests are completely normal, you will receive your results only by: [name] MyChart Message (if you have MyChart) OR . A paper copy in the mail If you have any lab test that is abnormal or we need to change your treatment, we will call you to review the results.   Testing/Procedures: No new testing needed   Follow-Up: At [facility], you and your health needs are our priority.  As part of our continuing mission to provide you with exceptional heart care, we have created designated Provider Care Teams.  These Care Teams include your primary Cardiologist (physician) and Advanced Practice Providers (APPs -  Physician Assistants and Nurse Practitioners) who all work together to provide you with the care you need, when you need it.  . You will need a follow up appointment in 12 months  . Providers on your designated Care Team:   . Murray Hodgkins, NP . Christell Faith, PA-C . Marrianne Mood, PA-C  Any Other Special Instructions Will Be Listed Below (If Applicable).  COVID-19 Vaccine Information can be found at: [email or web address].uk For questions related to vaccine distribution or appointments, please email [email or web address] or call [phone].

## 2019-12-08 ENCOUNTER — Ambulatory Visit: Payer: Medicare Other | Admitting: Dermatology

## 2020-03-30 ENCOUNTER — Inpatient Hospital Stay
Admission: EM | Admit: 2020-03-30 | Discharge: 2020-04-01 | DRG: 392 | Disposition: A | Discharge status: Home / Self Care | Payer: Medicare Other | Attending: Internal Medicine | Admitting: Internal Medicine

## 2020-03-30 ENCOUNTER — Emergency Department: Payer: Medicare Other

## 2020-03-30 ENCOUNTER — Other Ambulatory Visit: Payer: Self-pay

## 2020-03-30 DIAGNOSIS — E1169 Type 2 diabetes mellitus with other specified complication: Secondary | ICD-10-CM | POA: Diagnosis not present

## 2020-03-30 DIAGNOSIS — Z886 Allergy status to analgesic agent status: Secondary | ICD-10-CM

## 2020-03-30 DIAGNOSIS — K5792 Diverticulitis of intestine, part unspecified, without perforation or abscess without bleeding: Secondary | ICD-10-CM

## 2020-03-30 DIAGNOSIS — I129 Hypertensive chronic kidney disease with stage 1 through stage 4 chronic kidney disease, or unspecified chronic kidney disease: Secondary | ICD-10-CM | POA: Diagnosis present

## 2020-03-30 DIAGNOSIS — I2510 Atherosclerotic heart disease of native coronary artery without angina pectoris: Secondary | ICD-10-CM | POA: Diagnosis present

## 2020-03-30 DIAGNOSIS — E871 Hypo-osmolality and hyponatremia: Secondary | ICD-10-CM | POA: Diagnosis present

## 2020-03-30 DIAGNOSIS — Z8673 Personal history of transient ischemic attack (TIA), and cerebral infarction without residual deficits: Secondary | ICD-10-CM | POA: Diagnosis not present

## 2020-03-30 DIAGNOSIS — K219 Gastro-esophageal reflux disease without esophagitis: Secondary | ICD-10-CM | POA: Diagnosis present

## 2020-03-30 DIAGNOSIS — Z20822 Contact with and (suspected) exposure to covid-19: Secondary | ICD-10-CM | POA: Diagnosis present

## 2020-03-30 DIAGNOSIS — E1122 Type 2 diabetes mellitus with diabetic chronic kidney disease: Secondary | ICD-10-CM | POA: Diagnosis present

## 2020-03-30 DIAGNOSIS — E785 Hyperlipidemia, unspecified: Secondary | ICD-10-CM | POA: Diagnosis present

## 2020-03-30 DIAGNOSIS — Z7984 Long term (current) use of oral hypoglycemic drugs: Secondary | ICD-10-CM

## 2020-03-30 DIAGNOSIS — K5720 Diverticulitis of large intestine with perforation and abscess without bleeding: Secondary | ICD-10-CM | POA: Diagnosis present

## 2020-03-30 DIAGNOSIS — Z79890 Hormone replacement therapy (postmenopausal): Secondary | ICD-10-CM | POA: Diagnosis not present

## 2020-03-30 DIAGNOSIS — Z880 Allergy status to penicillin: Secondary | ICD-10-CM

## 2020-03-30 DIAGNOSIS — Z7902 Long term (current) use of antithrombotics/antiplatelets: Secondary | ICD-10-CM

## 2020-03-30 DIAGNOSIS — N189 Chronic kidney disease, unspecified: Secondary | ICD-10-CM | POA: Diagnosis present

## 2020-03-30 DIAGNOSIS — Z951 Presence of aortocoronary bypass graft: Secondary | ICD-10-CM

## 2020-03-30 DIAGNOSIS — Z7982 Long term (current) use of aspirin: Secondary | ICD-10-CM | POA: Diagnosis not present

## 2020-03-30 DIAGNOSIS — Z79899 Other long term (current) drug therapy: Secondary | ICD-10-CM

## 2020-03-30 DIAGNOSIS — I10 Essential (primary) hypertension: Secondary | ICD-10-CM | POA: Diagnosis not present

## 2020-03-30 DIAGNOSIS — Z794 Long term (current) use of insulin: Secondary | ICD-10-CM

## 2020-03-30 DIAGNOSIS — E861 Hypovolemia: Secondary | ICD-10-CM | POA: Diagnosis present

## 2020-03-30 LAB — URINALYSIS, COMPLETE (UACMP) WITH MICROSCOPIC
Bacteria, UA: NONE SEEN
Bilirubin Urine: NEGATIVE
Glucose, UA: NEGATIVE mg/dL
Ketones, ur: NEGATIVE mg/dL
Leukocytes,Ua: NEGATIVE
Nitrite: NEGATIVE
Protein, ur: NEGATIVE mg/dL
Specific Gravity, Urine: 1.008 (ref 1.005–1.030)
pH: 5 (ref 5.0–8.0)

## 2020-03-30 LAB — TROPONIN I (HIGH SENSITIVITY): Troponin I (High Sensitivity): 6 ng/L (ref <18)

## 2020-03-30 LAB — COMPREHENSIVE METABOLIC PANEL WITH GFR
ALT: 30 U/L (ref 0–44)
AST: 29 U/L (ref 15–41)
Albumin: 4.3 g/dL (ref 3.5–5.0)
Alkaline Phosphatase: 68 U/L (ref 38–126)
Anion gap: 10 (ref 5–15)
BUN: 21 mg/dL (ref 8–23)
CO2: 24 mmol/L (ref 22–32)
Calcium: 9.4 mg/dL (ref 8.9–10.3)
Chloride: 97 mmol/L — ABNORMAL LOW (ref 98–111)
Creatinine, Ser: 1.41 mg/dL — ABNORMAL HIGH (ref 0.61–1.24)
GFR, Estimated: 47 mL/min — ABNORMAL LOW
Glucose, Bld: 190 mg/dL — ABNORMAL HIGH (ref 70–99)
Potassium: 4.3 mmol/L (ref 3.5–5.1)
Sodium: 131 mmol/L — ABNORMAL LOW (ref 135–145)
Total Bilirubin: 0.9 mg/dL (ref 0.3–1.2)
Total Protein: 8.4 g/dL — ABNORMAL HIGH (ref 6.5–8.1)

## 2020-03-30 LAB — CBC WITH DIFFERENTIAL/PLATELET
Abs Immature Granulocytes: 0.05 10*3/uL (ref 0.00–0.07)
Basophils Absolute: 0.1 10*3/uL (ref 0.0–0.1)
Basophils Relative: 1 %
Eosinophils Absolute: 0.4 10*3/uL (ref 0.0–0.5)
Eosinophils Relative: 4 %
HCT: 41.8 % (ref 39.0–52.0)
Hemoglobin: 13.8 g/dL (ref 13.0–17.0)
Immature Granulocytes: 1 %
Lymphocytes Relative: 25 %
Lymphs Abs: 2.5 10*3/uL (ref 0.7–4.0)
MCH: 30.3 pg (ref 26.0–34.0)
MCHC: 33 g/dL (ref 30.0–36.0)
MCV: 91.7 fL (ref 80.0–100.0)
Monocytes Absolute: 2 10*3/uL — ABNORMAL HIGH (ref 0.1–1.0)
Monocytes Relative: 20 %
Neutro Abs: 4.9 10*3/uL (ref 1.7–7.7)
Neutrophils Relative %: 49 %
Platelets: 186 10*3/uL (ref 150–400)
RBC: 4.56 MIL/uL (ref 4.22–5.81)
RDW: 16.8 % — ABNORMAL HIGH (ref 11.5–15.5)
Smear Review: NORMAL
WBC: 10 10*3/uL (ref 4.0–10.5)
nRBC: 0 % (ref 0.0–0.2)

## 2020-03-30 LAB — GLUCOSE, CAPILLARY
Glucose-Capillary: 303 mg/dL — ABNORMAL HIGH (ref 70–99)
Glucose-Capillary: 145 mg/dL — ABNORMAL HIGH (ref 70–99)
Glucose-Capillary: 173 mg/dL — ABNORMAL HIGH (ref 70–99)

## 2020-03-30 LAB — HEMOGLOBIN A1C
Hgb A1c MFr Bld: 7 % — ABNORMAL HIGH (ref 4.8–5.6)
Mean Plasma Glucose: 154.2 mg/dL

## 2020-03-30 LAB — LIPASE, BLOOD: Lipase: 32 U/L (ref 11–51)

## 2020-03-30 LAB — RESP PANEL BY RT-PCR (FLU A&B, COVID) ARPGX2
Influenza A by PCR: NEGATIVE
Influenza B by PCR: NEGATIVE
SARS Coronavirus 2 by RT PCR: NEGATIVE

## 2020-03-30 MED ORDER — ONDANSETRON HCL 4 MG PO TABS: 4.0000 mg | ORAL_TABLET | Freq: Four times a day (QID) | ORAL | Status: DC | PRN

## 2020-03-30 MED ORDER — LACTATED RINGERS IV BOLUS
500.0000 mL | Freq: Once | INTRAVENOUS | Status: AC
  Administered 2020-03-30: 500 mL via INTRAVENOUS

## 2020-03-30 MED ORDER — FLUOXETINE HCL 20 MG PO CAPS
20.0000 mg | ORAL_CAPSULE | Freq: Every day | ORAL | Status: DC
  Administered 2020-03-30 – 2020-04-01 (×3): 20 mg via ORAL
  Filled 2020-03-30 (×3): qty 1

## 2020-03-30 MED ORDER — CIPROFLOXACIN IN D5W 400 MG/200ML IV SOLN
400.0000 mg | Freq: Two times a day (BID) | INTRAVENOUS | Status: DC
  Administered 2020-03-30 – 2020-04-01 (×5): 400 mg via INTRAVENOUS
  Filled 2020-03-30 (×7): qty 200

## 2020-03-30 MED ORDER — PANTOPRAZOLE SODIUM 40 MG PO TBEC
40.0000 mg | DELAYED_RELEASE_TABLET | Freq: Every day | ORAL | Status: DC
  Administered 2020-03-30 – 2020-04-01 (×3): 40 mg via ORAL
  Filled 2020-03-30 (×3): qty 1

## 2020-03-30 MED ORDER — SODIUM CHLORIDE 0.9 % IV SOLN: INTRAVENOUS | Status: DC

## 2020-03-30 MED ORDER — SODIUM CHLORIDE 0.9 % IV SOLN
1.0000 g | Freq: Once | INTRAVENOUS | Status: AC
  Administered 2020-03-30: 1 g via INTRAVENOUS
  Filled 2020-03-30: qty 1

## 2020-03-30 MED ORDER — ONDANSETRON HCL 4 MG/2ML IJ SOLN
4.0000 mg | Freq: Once | INTRAMUSCULAR | Status: AC
  Administered 2020-03-30: 4 mg via INTRAVENOUS
  Filled 2020-03-30: qty 2

## 2020-03-30 MED ORDER — IOHEXOL 300 MG/ML  SOLN
75.0000 mL | Freq: Once | INTRAMUSCULAR | Status: AC | PRN
  Administered 2020-03-30: 75 mL via INTRAVENOUS

## 2020-03-30 MED ORDER — SENNOSIDES-DOCUSATE SODIUM 8.6-50 MG PO TABS: 1.0000 | ORAL_TABLET | Freq: Every evening | ORAL | Status: DC | PRN

## 2020-03-30 MED ORDER — INSULIN ASPART 100 UNIT/ML ~~LOC~~ SOLN
0.0000 [IU] | Freq: Three times a day (TID) | SUBCUTANEOUS | Status: DC
  Administered 2020-03-30: 11 [IU] via SUBCUTANEOUS
  Administered 2020-03-30: 2 [IU] via SUBCUTANEOUS
  Administered 2020-03-31 (×2): 3 [IU] via SUBCUTANEOUS
  Administered 2020-03-31: 2 [IU] via SUBCUTANEOUS
  Administered 2020-04-01 (×2): 3 [IU] via SUBCUTANEOUS
  Administered 2020-04-01: 2 [IU] via SUBCUTANEOUS
  Filled 2020-03-30 (×8): qty 1

## 2020-03-30 MED ORDER — BISACODYL 5 MG PO TBEC: 5.0000 mg | DELAYED_RELEASE_TABLET | Freq: Every day | ORAL | Status: DC | PRN

## 2020-03-30 MED ORDER — INSULIN DETEMIR 100 UNIT/ML ~~LOC~~ SOLN
25.0000 [IU] | Freq: Every day | SUBCUTANEOUS | Status: DC
  Administered 2020-03-30 – 2020-03-31 (×2): 25 [IU] via SUBCUTANEOUS
  Filled 2020-03-30 (×3): qty 0.25

## 2020-03-30 MED ORDER — ACETAMINOPHEN 325 MG PO TABS: 650.0000 mg | ORAL_TABLET | Freq: Four times a day (QID) | ORAL | Status: DC | PRN

## 2020-03-30 MED ORDER — ENOXAPARIN SODIUM 40 MG/0.4ML ~~LOC~~ SOLN
40.0000 mg | SUBCUTANEOUS | Status: DC
  Administered 2020-03-30 – 2020-03-31 (×2): 40 mg via SUBCUTANEOUS
  Filled 2020-03-30 (×2): qty 0.4

## 2020-03-30 MED ORDER — GABAPENTIN 300 MG PO CAPS
300.0000 mg | ORAL_CAPSULE | Freq: Three times a day (TID) | ORAL | Status: DC
  Administered 2020-03-30 – 2020-04-01 (×8): 300 mg via ORAL
  Filled 2020-03-30 (×8): qty 1

## 2020-03-30 MED ORDER — TAMSULOSIN HCL 0.4 MG PO CAPS
0.4000 mg | ORAL_CAPSULE | Freq: Every evening | ORAL | Status: DC
  Administered 2020-03-30 – 2020-04-01 (×3): 0.4 mg via ORAL
  Filled 2020-03-30 (×3): qty 1

## 2020-03-30 MED ORDER — METRONIDAZOLE IN NACL 5-0.79 MG/ML-% IV SOLN
500.0000 mg | Freq: Once | INTRAVENOUS | Status: AC
  Administered 2020-03-30: 500 mg via INTRAVENOUS
  Filled 2020-03-30: qty 100

## 2020-03-30 MED ORDER — AMLODIPINE BESYLATE 10 MG PO TABS
10.0000 mg | ORAL_TABLET | Freq: Every day | ORAL | Status: DC
  Administered 2020-03-30 – 2020-04-01 (×3): 10 mg via ORAL
  Filled 2020-03-30: qty 2
  Filled 2020-03-30 (×2): qty 1

## 2020-03-30 MED ORDER — METRONIDAZOLE IN NACL 5-0.79 MG/ML-% IV SOLN
500.0000 mg | Freq: Three times a day (TID) | INTRAVENOUS | Status: DC
  Administered 2020-03-30 – 2020-04-01 (×7): 500 mg via INTRAVENOUS
  Filled 2020-03-30 (×9): qty 100

## 2020-03-30 MED ORDER — ONDANSETRON HCL 4 MG/2ML IJ SOLN: 4.0000 mg | Freq: Four times a day (QID) | INTRAMUSCULAR | Status: DC | PRN

## 2020-03-30 MED ORDER — METOPROLOL TARTRATE 25 MG PO TABS
25.0000 mg | ORAL_TABLET | Freq: Two times a day (BID) | ORAL | Status: DC
  Administered 2020-03-30 – 2020-04-01 (×5): 25 mg via ORAL
  Filled 2020-03-30 (×5): qty 1

## 2020-03-30 MED ORDER — MELATONIN 5 MG PO TABS
2.5000 mg | ORAL_TABLET | Freq: Every day | ORAL | Status: DC
Start: 2020-03-30 — End: 2020-04-01
  Administered 2020-03-30 – 2020-03-31 (×2): 2.5 mg via ORAL
  Filled 2020-03-30 (×2): qty 1

## 2020-03-30 MED ORDER — EZETIMIBE 10 MG PO TABS
10.0000 mg | ORAL_TABLET | Freq: Every day | ORAL | Status: DC
  Administered 2020-03-30 – 2020-04-01 (×3): 10 mg via ORAL
  Filled 2020-03-30 (×3): qty 1

## 2020-03-30 MED ORDER — LISINOPRIL 20 MG PO TABS
20.0000 mg | ORAL_TABLET | Freq: Every day | ORAL | Status: DC
  Administered 2020-03-30 – 2020-04-01 (×3): 20 mg via ORAL
  Filled 2020-03-30: qty 2
  Filled 2020-03-30 (×2): qty 1

## 2020-03-30 MED ORDER — INSULIN ASPART 100 UNIT/ML ~~LOC~~ SOLN
0.0000 [IU] | Freq: Every day | SUBCUTANEOUS | Status: DC
  Administered 2020-03-31: 2 [IU] via SUBCUTANEOUS
  Filled 2020-03-30: qty 1

## 2020-03-30 MED ORDER — ATORVASTATIN CALCIUM 10 MG PO TABS
10.0000 mg | ORAL_TABLET | ORAL | Status: DC
  Administered 2020-03-30 – 2020-04-01 (×2): 10 mg via ORAL
  Filled 2020-03-30 (×3): qty 1

## 2020-03-30 MED ORDER — METFORMIN HCL ER 500 MG PO TB24: 500.0000 mg | ORAL_TABLET | Freq: Two times a day (BID) | ORAL | Status: DC

## 2020-03-30 MED ORDER — ACETAMINOPHEN 650 MG RE SUPP: 650.0000 mg | Freq: Four times a day (QID) | RECTAL | Status: DC | PRN

## 2020-03-30 MED ORDER — CLOPIDOGREL BISULFATE 75 MG PO TABS
75.0000 mg | ORAL_TABLET | Freq: Every day | ORAL | Status: DC
  Administered 2020-03-30 – 2020-04-01 (×3): 75 mg via ORAL
  Filled 2020-03-30 (×3): qty 1

## 2020-03-30 NOTE — Plan of Care (Signed)

## 2020-03-30 NOTE — Consult Note (Signed)
SURGICAL CONSULTATION NOTE   HISTORY OF PRESENT ILLNESS (HPI):  87 y.o. male presented to [facility] ED for evaluation of nausea and vomiting. Patient reports he has been feeling nauseous in the last 3 to 4 days.  He mainly have pain on the epigastric area.  She did have some minimal pain in the lower abdomen.  Pain aggravated by pressure.  There has been no alleviating factors.  Pain does not radiate to other part of the body.  Patient denies any fever or chills.  At the ED he was found with 10,000 white blood cell count.  CT scan of the abdomen and pelvis shows complicated diverticulitis with a 1.9 cm intramural abscess.  There is no free air or fluid.  There is significant fat stranding around the sigmoid colon.  Surgery is consulted by Dr. Manuella Ghazi in this context for evaluation and management of complicated diverticulitis.  PAST MEDICAL HISTORY (PMH):  Past Medical History:  Diagnosis Date  . Arrhythmia    A-Fib  . BPH (benign prostatic hyperplasia)   . Coronary artery disease 2009   CABG X 3  @ [facility]  . CVA (cerebral infarction) 2014  . Diabetes mellitus without complication ([place])   . Enlarged prostate   . GERD (gastroesophageal reflux disease)   . Hyperlipidemia   . Hypertension   . Renal disorder      PAST SURGICAL HISTORY ([place]):  Past Surgical History:  Procedure Laterality Date  . cataract surgery Bilateral   . CORONARY ARTERY BYPASS GRAFT  2009   CABG X 3 @ [facility]  . TEAR DUCT PROBING Right 05/07/2016   Procedure: Evacuation of hematoma, right peri-orbital bleed.;  Surgeon: Clista Bernhardt, MD;  Location: [place];  Service: Ophthalmology;  Laterality: Right;     MEDICATIONS:  Prior to Admission medications   Medication Sig Start Date End Date Taking? Authorizing Provider  amLODipine (NORVASC) 10 MG tablet TAKE 1 TABLET BY MOUTH EVERY DAY Patient taking differently: Take 10 mg by mouth daily. 10/03/12  Yes Minna Merritts, MD  aspirin EC 81 MG tablet Take 81 mg by mouth.   Yes  [provider]  atorvastatin (LIPITOR) 10 MG tablet Take 1 tablet (10 mg total) by mouth every other day. Patient taking differently: Take 10 mg by mouth daily. 12/05/19  Yes Gollan, Kathlene November, MD  clopidogrel (PLAVIX) 75 MG tablet TAKE 1 TABLET(75 MG) BY MOUTH DAILY Patient taking differently: Take 75 mg by mouth daily. 08/20/17  Yes Minna Merritts, MD  ezetimibe (ZETIA) 10 MG tablet Take 1 tablet (10 mg total) by mouth daily. 12/05/19  Yes Gollan, Kathlene November, MD  FLUoxetine (PROZAC) 20 MG capsule Take 20 mg by mouth daily.   Yes [provider]  gabapentin (NEURONTIN) 300 MG capsule Take 300 mg by mouth 3 (three) times daily.  01/24/12  Yes [provider]  insulin detemir (LEVEMIR) 100 UNIT/ML injection Inject 46 Units into the skin at bedtime.   Yes [provider]  insulin lispro (HUMALOG) 100 UNIT/ML injection Inject 0-25 Units into the skin as directed.   Yes [provider]  lisinopril (PRINIVIL,ZESTRIL) 20 MG tablet Take 20 mg by mouth daily.  01/27/13  Yes [provider]  metFORMIN (GLUCOPHAGE-XR) 500 MG 24 hr tablet Take 500 mg by mouth 2 (two) times daily with a meal.   Yes [provider]  metoprolol tartrate (LOPRESSOR) 25 MG tablet Take 25 mg by mouth 2 (two) times daily.   Yes [provider]  pantoprazole (PROTONIX) 40 MG tablet Take 40 mg by mouth daily.   Yes [provider]  tamsulosin (FLOMAX) 0.4 MG CAPS capsule Take 0.4 mg by mouth every evening.   Yes [provider]  Melatonin 500 MCG TBDP Take 500 mcg by mouth at bedtime.    [provider]     ALLERGIES:  Allergies  Allergen Reactions  . Ibuprofen Swelling  . Penicillins Swelling    lips     SOCIAL HISTORY:  Social History   Socioeconomic History  . Marital status: Widowed    Spouse name: Not on file  . Number of children: Not on file  . Years of education: Not on file  . Highest education level: Not on  file  Occupational History  . Not on file  Tobacco Use  . Smoking status: Never Smoker  . Smokeless tobacco: Never Used  Substance and Sexual Activity  . Alcohol use: No    Comment: occasional  . Drug use: No  . Sexual activity: Not on file  Other Topics Concern  . Not on file  Social History Narrative  . Not on file   Social Determinants of Health   Financial Resource Strain: Not on file  Food Insecurity: Not on file  Transportation Needs: Not on file  Physical Activity: Not on file  Stress: Not on file  Social Connections: Not on file  Intimate Partner Violence: Not on file      FAMILY HISTORY:  Family History  Family history unknown: Yes     REVIEW OF SYSTEMS:  Constitutional: denies weight loss, fever, chills, or sweats  Eyes: denies any other vision changes, history of eye injury  ENT: denies sore throat, hearing problems  Respiratory: denies shortness of breath, wheezing  Cardiovascular: denies chest pain, palpitations  Gastrointestinal: Positive abdominal pain, nausea and vomiting Genitourinary: denies burning with urination or urinary frequency Musculoskeletal: denies any other joint pains or cramps  Skin: denies any other rashes or skin discolorations  Neurological: denies any other headache, dizziness, weakness  Psychiatric: denies any other depression, anxiety   All other review of systems were negative   VITAL SIGNS:  Temp:  [97.4 F (36.3 C)-98.1 F (36.7 C)] 98.1 F (36.7 C) (02/18 1333) Pulse Rate:  [54-79] 54 (02/18 1333) Resp:  [16-30] 18 (02/18 1333) BP: (153-196)/(75-97) 153/79 (02/18 1333) SpO2:  [93 %-97 %] 97 % (02/18 1333) Weight:  [86.2 kg] 86.2 kg (02/18 0303)     Height: 6' (182.9 cm) Weight: 86.2 kg BMI (Calculated): 25.76   INTAKE/OUTPUT:  This shift: Total I/O In: -  Out: 200 [Urine:200]  Last 2 shifts: @IOLAST2SHIFTS @   PHYSICAL EXAM:  Constitutional:  -- Normal body habitus  -- Awake, alert, and oriented x3  Eyes:   -- Pupils equally round and reactive to light  -- No scleral icterus  Ear, nose, and throat:  -- No jugular venous distension  Pulmonary:  -- No crackles  -- Equal breath sounds bilaterally -- Breathing non-labored at rest Cardiovascular:  -- S1, S2 present  -- No pericardial rubs Gastrointestinal:  -- Abdomen soft, mild tender in the lower abdomen, non-distended, no guarding or rebound tenderness -- No abdominal masses appreciated, pulsatile or otherwise  Musculoskeletal and Integumentary:  -- Wounds: None appreciated -- Extremities: B/L UE and LE FROM, hands and feet warm, no edema  Neurologic:  -- Motor function: intact and symmetric -- Sensation: intact and symmetric   Labs:  CBC Latest Ref Rng &  Units 03/30/2020 05/10/2016 05/08/2016  WBC 4.0 - 10.5 K/uL 10.0 5.9 6.2  Hemoglobin 13.0 - 17.0 g/dL 13.8 12.8(L) 12.9(L)  Hematocrit 39.0 - 52.0 % 41.8 38.3(L) 38.7(L)  Platelets 150 - 400 K/uL 186 258 273   CMP Latest Ref Rng & Units 03/30/2020 05/10/2016 05/07/2016  Glucose 70 - 99 mg/dL 190(H) 111(H) 197(H)  BUN 8 - 23 mg/dL 21 19 -  Creatinine 0.61 - 1.24 mg/dL 1.41(H) 1.41(H) -  Sodium 135 - 145 mmol/L 131(L) 135 140  Potassium 3.5 - 5.1 mmol/L 4.3 4.0 4.2  Chloride 98 - 111 mmol/L 97(L) 100(L) -  CO2 22 - 32 mmol/L 24 26 -  Calcium 8.9 - 10.3 mg/dL 9.4 9.0 -  Total Protein 6.5 - 8.1 g/dL 8.4(H) 6.7 -  Total Bilirubin 0.3 - 1.2 mg/dL 0.9 0.9 -  Alkaline Phos 38 - 126 U/L 68 47 -  AST 15 - 41 U/L 29 39 -  ALT 0 - 44 U/L 30 34 -     Imaging studies:  EXAM: CT ABDOMEN AND PELVIS WITH CONTRAST  TECHNIQUE: Multidetector CT imaging of the abdomen and pelvis was performed using the standard protocol following bolus administration of intravenous contrast.  CONTRAST:  86mL OMNIPAQUE IOHEXOL 300 MG/ML  SOLN  COMPARISON:  08/14/2015  FINDINGS: Lower chest: No acute abnormality.  Coronary artery calcifications.  Hepatobiliary: No solid liver abnormality is seen.  Gallstone near the gallbladder neck. No gallbladder wall thickening, or biliary dilatation.  Pancreas: Mild adjacent fat stranding about the pancreatic head (series 2, image 39). No pancreatic ductal dilatation.  Spleen: Normal in size without significant abnormality.  Adrenals/Urinary Tract: Adrenal glands are unremarkable. Kidneys are normal, without renal calculi, solid lesion, or hydronephrosis. Bladder is unremarkable.  Stomach/Bowel: Stomach is within normal limits. Appendix appears normal. There is sigmoid diverticulosis with wall thickening and fat stranding about the mid sigmoid, with a rim enhancing small fluid collection anteriorly measuring 1.9 x 1.3 cm (series 2, image 77). There is additional fat stranding in the central small bowel mesentery.  Vascular/Lymphatic: Aortic atherosclerosis. Enlarged celiac axis and gastrohepatic ligament lymph nodes measuring up to 1.8 x 1.3 cm (series 2, image 27). Enlarged bilateral iliac and pelvic sidewall lymph nodes measuring up to 1.9 x 1.5 cm (series 2, image 72).  Reproductive: Prostatomegaly.  Other: No abdominal wall hernia or abnormality. Trace ascites throughout the abdomen and pelvis. Fat stranding in the central small bowel mesentery and retroperitoneum (series 2, image 43, 58).  Musculoskeletal: No acute or significant osseous findings.  IMPRESSION: 1. There is sigmoid diverticulosis with wall thickening and fat stranding about the mid sigmoid, with a rim enhancing small fluid collection anteriorly measuring 1.9 cm. Findings are consistent with acute diverticulitis complicated by intramural abscess. No evidence of perforation. 2. There is additional fat stranding in the central small bowel mesentery and retroperitoneum, generally greater than would be expected in the setting of diverticulitis and also appearing to surround the pancreatic head. These findings suggest acute pancreatitis, perhaps true and  unrelated todiverticulitis. 3. Enlarged retroperitoneal, iliac, and pelvic sidewall lymph nodes, of uncertain significance, most likely reactive. 4. Trace ascites throughout the abdomen and pelvis. 5. Prostatomegaly. Cholelithiasis without evidence of acute cholecystitis. 6. Coronary artery disease.  Aortic Atherosclerosis (ICD10-I70.0).   Electronically Signed   By: Eddie Candle M.D.   On: 03/30/2020 08:28  Assessment/Plan:  87 y.o. male with complete diverticulitis with 1.9 cm intramural abscess, complicated by pertinent comorbidities including diabetes mellitus, hypertension, chronic kidney disease.  Patient with contained perforation with intramural abscess.  Currently without leukocytosis and minimal pain.  I agree with conservative treatment with adequate IV hydration, IV antibiotic therapy.  I also agree with clear liquid diet.  Will follow clinically with physical exam, labs, vital signs.  May need to repeat CT scan in 48 to 72 hours.  The patient was oriented that he respond to antibiotic therapy there will be no further management but if deteriorates he will need partial colectomy with end colostomy creation.  The patient report understood.  I will follow closely.   Arnold Long, MD

## 2020-03-30 NOTE — H&P (Signed)
Randy Frazier at [place] NAME: Randy Frazier    MR#:  [id number]  DATE OF BIRTH:  1927-02-23  DATE OF ADMISSION:  03/30/2020  PRIMARY CARE PHYSICIAN: Randy Castle, MD   REQUESTING/REFERRING PHYSICIAN: Blake Divine, MD  Comes from home At baseline independent  CHIEF COMPLAINT:   Chief Complaint  Patient presents with  . Anxiety  . Urinary Incontinence   HISTORY OF PRESENT ILLNESS:  Randy Frazier  is a 87 y.o. male with a known history of hypertension, hyperlipidemia, diabetes, coronary artery disease status post CABG, GERD and CVA is being admitted for acute diverticulitis with abscess.  Patient woke up yesterday morning feeling nauseous which persisted all day, no vomiting.  Considering his diabetes he was concerned about complication of diabetes and or some kidney issue.  He started having some bilateral lower quadrant abdominal pain starting today which made him concerned about kidney and or bowel issues as he had history of bowel obstruction.  He tried enema at home but no luck.  He decided to come to the emergency department.  ED course: Normal labs and vitals. CT abdomen significant for diverticulitis with small 1.9 cm abscess, inflammation tracking up towards his pancreas.  Case was discussed with surgery Dr. Peyton Frazier who recommended admission and IV antibiotic.  He may need repeat CT scan in 48-72 hours per surgery.  No need for surgery at this time.  Patient wants to eat at this time.  Denies any other symptoms. PAST MEDICAL HISTORY:   Past Medical History:  Diagnosis Date  . Arrhythmia    A-Fib  . BPH (benign prostatic hyperplasia)   . Coronary artery disease 2009   CABG X 3  @ [facility]  . CVA (cerebral infarction) 2014  . Diabetes mellitus without complication ([place])   . Enlarged prostate   . GERD (gastroesophageal reflux disease)   . Hyperlipidemia   . Hypertension   . Renal disorder    PAST SURGICAL HISTORY:   Past Surgical  History:  Procedure Laterality Date  . cataract surgery Bilateral   . CORONARY ARTERY BYPASS GRAFT  2009   CABG X 3 @ [facility]  . TEAR DUCT PROBING Right 05/07/2016   Procedure: Evacuation of hematoma, right peri-orbital bleed.;  Surgeon: Randy Bernhardt, MD;  Location: [facility];  Service: Ophthalmology;  Laterality: Right;   SOCIAL HISTORY:   Social History   Tobacco Use  . Smoking status: Never Smoker  . Smokeless tobacco: Never Used  Substance Use Topics  . Alcohol use: No    Comment: occasional   FAMILY HISTORY:   Family History  Family history unknown: Yes   DRUG ALLERGIES:   Allergies  Allergen Reactions  . Ibuprofen Swelling  . Penicillins Swelling    lips   REVIEW OF SYSTEMS:  Review of Systems  Constitutional: Negative for diaphoresis, fever, malaise/fatigue and weight loss.  HENT: Negative for ear discharge, ear pain, hearing loss, nosebleeds, sore throat and tinnitus.   Eyes: Negative for blurred vision and pain.  Respiratory: Negative for cough, hemoptysis, shortness of breath and wheezing.   Cardiovascular: Negative for chest pain, palpitations, orthopnea and leg swelling.  Gastrointestinal: Positive for abdominal pain, constipation and nausea. Negative for blood in stool, diarrhea, heartburn and vomiting.  Genitourinary: Negative for dysuria, frequency and urgency.  Musculoskeletal: Negative for back pain and myalgias.  Skin: Negative for itching and rash.  Neurological: Negative for dizziness, tingling, tremors, focal weakness, seizures, weakness and headaches.  Psychiatric/Behavioral: Negative for  depression. The patient is not nervous/anxious.    MEDICATIONS AT HOME:   Prior to Admission medications   Medication Sig Start Date End Date Taking? Authorizing Provider  amLODipine (NORVASC) 10 MG tablet TAKE 1 TABLET BY MOUTH EVERY DAY Patient taking differently: Take 10 mg by mouth daily. 10/03/12  Yes Randy Merritts, MD  aspirin EC 81 MG tablet Take 81 mg  by mouth.   Yes [provider]  atorvastatin (LIPITOR) 10 MG tablet Take 1 tablet (10 mg total) by mouth every other day. Patient taking differently: Take 10 mg by mouth daily. 12/05/19  Yes Randy Frazier, Randy November, MD  clopidogrel (PLAVIX) 75 MG tablet TAKE 1 TABLET(75 MG) BY MOUTH DAILY Patient taking differently: Take 75 mg by mouth daily. 08/20/17  Yes Randy Merritts, MD  ezetimibe (ZETIA) 10 MG tablet Take 1 tablet (10 mg total) by mouth daily. 12/05/19  Yes Randy Frazier, Randy November, MD  FLUoxetine (PROZAC) 20 MG capsule Take 20 mg by mouth daily.   Yes [provider]  gabapentin (NEURONTIN) 300 MG capsule Take 300 mg by mouth 3 (three) times daily.  01/24/12  Yes [provider]  insulin detemir (LEVEMIR) 100 UNIT/ML injection Inject 46 Units into the skin at bedtime.   Yes [provider]  insulin lispro (HUMALOG) 100 UNIT/ML injection Inject 0-25 Units into the skin as directed.   Yes [provider]  lisinopril (PRINIVIL,ZESTRIL) 20 MG tablet Take 20 mg by mouth daily.  01/27/13  Yes [provider]  metFORMIN (GLUCOPHAGE-XR) 500 MG 24 hr tablet Take 500 mg by mouth 2 (two) times daily with a meal.   Yes [provider]  metoprolol tartrate (LOPRESSOR) 25 MG tablet Take 25 mg by mouth 2 (two) times daily.   Yes [provider]  pantoprazole (PROTONIX) 40 MG tablet Take 40 mg by mouth daily.   Yes [provider]  tamsulosin (FLOMAX) 0.4 MG CAPS capsule Take 0.4 mg by mouth every evening.   Yes [provider]  Melatonin 500 MCG TBDP Take 500 mcg by mouth at bedtime.    [provider]    VITAL SIGNS:  Blood pressure (!) 161/80, pulse 77, temperature (!) 97.4 F (36.3 C), temperature source Oral, resp. rate 18, height 6' (1.829 m), weight 86.2 kg, SpO2 95 %. PHYSICAL EXAMINATION:  Physical Exam HENT:     Head: Normocephalic and atraumatic.  Eyes:     Extraocular Movements: EOM normal.      Conjunctiva/sclera: Conjunctivae normal.     Pupils: Pupils are equal, round, and reactive to light.  Neck:     Thyroid: No thyromegaly.     Trachea: No tracheal deviation.  Cardiovascular:     Rate and Rhythm: Normal rate and regular rhythm.     Heart sounds: Normal heart sounds.  Pulmonary:     Effort: Pulmonary effort is normal. No respiratory distress.     Breath sounds: Normal breath sounds. No wheezing.  Chest:     Chest wall: No tenderness.  Abdominal:     General: Bowel sounds are normal. There is no distension.     Palpations: Abdomen is soft.     Tenderness: There is no abdominal tenderness.  Musculoskeletal:        General: Normal range of motion.     Cervical back: Normal range of motion and neck supple.  Skin:    General: Skin is warm and dry.     Findings: No rash.  Neurological:  Mental Status: He is alert and oriented to person, place, and time.     Cranial Nerves: No cranial nerve deficit.    LABORATORY PANEL:   CBC Recent Labs  Lab 03/30/20 0308  WBC 10.0  HGB 13.8  HCT 41.8  PLT 186   ------------------------------------------------------------------------------------------------------------------  Chemistries  Recent Labs  Lab 03/30/20 0308  NA 131*  K 4.3  CL 97*  CO2 24  GLUCOSE 190*  BUN 21  CREATININE 1.41*  CALCIUM 9.4  AST 29  ALT 30  ALKPHOS 68  BILITOT 0.9   ------------------------------------------------------------------------------------------------------------------  Cardiac Enzymes No results for input(s): TROPONINI in the last 168 hours. ------------------------------------------------------------------------------------------------------------------  RADIOLOGY:  CT Abdomen Pelvis W Contrast  Result Date: 03/30/2020 CLINICAL DATA:  Abdominal pain, nausea EXAM: CT ABDOMEN AND PELVIS WITH CONTRAST TECHNIQUE: Multidetector CT imaging of the abdomen and pelvis was performed using the standard protocol following bolus  administration of intravenous contrast. CONTRAST:  83mL OMNIPAQUE IOHEXOL 300 MG/ML  SOLN COMPARISON:  08/14/2015 FINDINGS: Lower chest: No acute abnormality.  Coronary artery calcifications. Hepatobiliary: No solid liver abnormality is seen. Gallstone near the gallbladder neck. No gallbladder wall thickening, or biliary dilatation. Pancreas: Mild adjacent fat stranding about the pancreatic head (series 2, image 39). No pancreatic ductal dilatation. Spleen: Normal in size without significant abnormality. Adrenals/Urinary Tract: Adrenal glands are unremarkable. Kidneys are normal, without renal calculi, solid lesion, or hydronephrosis. Bladder is unremarkable. Stomach/Bowel: Stomach is within normal limits. Appendix appears normal. There is sigmoid diverticulosis with wall thickening and fat stranding about the mid sigmoid, with a rim enhancing small fluid collection anteriorly measuring 1.9 x 1.3 cm (series 2, image 77). There is additional fat stranding in the central small bowel mesentery. Vascular/Lymphatic: Aortic atherosclerosis. Enlarged celiac axis and gastrohepatic ligament lymph nodes measuring up to 1.8 x 1.3 cm (series 2, image 27). Enlarged bilateral iliac and pelvic sidewall lymph nodes measuring up to 1.9 x 1.5 cm (series 2, image 72). Reproductive: Prostatomegaly. Other: No abdominal wall hernia or abnormality. Trace ascites throughout the abdomen and pelvis. Fat stranding in the central small bowel mesentery and retroperitoneum (series 2, image 43, 58). Musculoskeletal: No acute or significant osseous findings. IMPRESSION: 1. There is sigmoid diverticulosis with wall thickening and fat stranding about the mid sigmoid, with a rim enhancing small fluid collection anteriorly measuring 1.9 cm. Findings are consistent with acute diverticulitis complicated by intramural abscess. No evidence of perforation. 2. There is additional fat stranding in the central small bowel mesentery and retroperitoneum,  generally greater than would be expected in the setting of diverticulitis and also appearing to surround the pancreatic head. These findings suggest acute pancreatitis, perhaps true and unrelated to diverticulitis. 3. Enlarged retroperitoneal, iliac, and pelvic sidewall lymph nodes, of uncertain significance, most likely reactive. 4. Trace ascites throughout the abdomen and pelvis. 5. Prostatomegaly. Cholelithiasis without evidence of acute cholecystitis. 6. Coronary artery disease. Aortic Atherosclerosis (ICD10-I70.0). Electronically Signed   By: Eddie Candle M.D.   On: 03/30/2020 08:28   IMPRESSION AND PLAN:  87 year old male with a known history of known history of hypertension, hyperlipidemia, diabetes, coronary artery disease status post CABG, GERD and CVA is being admitted for acute diverticulitis with abscess.    Acute sigmoid diverticulosis and diverticulitis with 1.9 cm intramural abscess Continue IV Cipro and Flagyl Surgery consult -Dr. Peyton Frazier made aware Conservative management for now Patient does not have much symptoms from this May need repeat CT in 48-72 hours per surgery  Diabetes mellitus Sliding scale insulin, hold  Metformin Start insulin Levemir 25 units subcu nightly  Essential hypertension Continue metoprolol, lisinopril, amlodipine  History of CVA and CAD Continue Plavix and statin  Status is: Inpatient  Remains inpatient appropriate because:IV treatments appropriate due to intensity of illness or inability to take PO   Dispo: The patient is from: Home              Anticipated d/c is to: Home              Anticipated d/c date is: 3 days              Patient currently is not medically stable to d/c.   Difficult to place patient No   All the records are reviewed and case discussed with ED provider. Management plans discussed with the patient, nursing and they are in agreement.  CODE STATUS: Full code  TOTAL TIME TAKING CARE OF THIS PATIENT: 45 minutes.     Max Sane M.D on 03/30/2020 at 12:54 PM  [facility]   CC: Primary care physician; Randy Castle, MD   Note: This dictation was prepared with Dragon dictation along with smaller phrase technology. Any transcriptional errors that result from this process are unintentional.

## 2020-03-30 NOTE — ED Triage Notes (Signed)
Pt states he felt nauseated yesterday, gave himself an enema and now is urinating on himself and feels anxious because he is not sure what is going on.

## 2020-03-30 NOTE — ED Notes (Signed)
Pt attempting to void with urinal at this time after reiterating to this nurse c/o nausea that began upon waking 1 day prior to arrival and episode of diarrhea last night -- denies abd pain.

## 2020-03-30 NOTE — ED Notes (Signed)
Pt ambulatory independently with steady gait from waiting room escorted by first nurse.  No acute distress noted.

## 2020-03-30 NOTE — ED Provider Notes (Signed)
[facility] Emergency Department Provider Note   ____________________________________________   Event Date/Time   First MD Initiated Contact with Patient 03/30/20 [phone]     (approximate)  I have reviewed the triage vital signs and the nursing notes.   HISTORY  Chief Complaint Anxiety and Urinary Incontinence    HPI Randy Frazier is a 87 y.o. male with past medical history of hypertension, hyperlipidemia, diabetes, CAD status post CABG, GERD, and CVA who presents to the ED complaining of nausea.  Patient reports that he woke up yesterday morning feeling nauseous and this feeling has persisted through until today.  He denies any vomiting and has not had any changes in his bowel movements, states he has been able to eat and drink normally.  When nausea persisted today, he became concerned that it could be related to his heart and states "I do not want another bypass."  He denies any fevers, cough, chest pain, or shortness of breath.  He developed some pain in the bilateral lower quadrants of his abdomen over the course of today.  He was concerned he could have a bowel blockage and tried an enema at home, which only produced watery stool.  He endorses urinary frequency, but denies any hematuria or dysuria.        Past Medical History:  Diagnosis Date  . Arrhythmia    A-Fib  . BPH (benign prostatic hyperplasia)   . Coronary artery disease 2009   CABG X 3  @ [facility]  . CVA (cerebral infarction) 2014  . Diabetes mellitus without complication ([place])   . Enlarged prostate   . GERD (gastroesophageal reflux disease)   . Hyperlipidemia   . Hypertension   . Renal disorder     Patient Active Problem List   Diagnosis Date Noted  . Colonic diverticular abscess 03/30/2020  . Periorbital hematoma 05/07/2016  . Acute kidney failure, unspecified ([place]) 05/07/2016  . Chronic sinusitis 05/07/2016  . Hypertrophy of prostate with urinary obstruction and other lower  urinary tract symptoms (LUTS) 05/07/2016  . Benign prostatic hyperplasia with urinary obstruction 08/17/2015  . Controlled type 2 diabetes mellitus without complication ([place]) 99/37/1696  . Psoriasis 08/16/2015  . 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  . DM type 2 with diabetic peripheral neuropathy ([place]) 08/10/2015  . Cerebrovascular accident (CVA) ([place]) 08/18/2012  . Gout 08/18/2012  . Depressive disorder, not elsewhere classified 07/31/2011  . BPH (benign prostatic hyperplasia) 06/19/2011  . Hypertension 06/19/2011  . Diabetes mellitus type II, controlled ([place]) 06/19/2010  . Hyperlipidemia 12/11/2009  . HYPERTENSION, BENIGN 12/11/2009  . Coronary atherosclerosis 12/11/2009  . CAD, AUTOLOGOUS BYPASS GRAFT 12/11/2009    Past Surgical History:  Procedure Laterality Date  . cataract surgery Bilateral   . CORONARY ARTERY BYPASS GRAFT  2009   CABG X 3 @ [facility]  . TEAR DUCT PROBING Right 05/07/2016   Procedure: Evacuation of hematoma, right peri-orbital bleed.;  Surgeon: Clista Bernhardt, MD;  Location: [facility];  Service: Ophthalmology;  Laterality: Right;    Prior to Admission medications   Medication Sig Start Date End Date Taking? Authorizing Provider  amLODipine (NORVASC) 10 MG tablet TAKE 1 TABLET BY MOUTH EVERY DAY Patient taking differently: Take 10 mg by mouth daily. 10/03/12  Yes Minna Merritts, MD  aspirin EC 81 MG tablet Take 81 mg by mouth.   Yes [provider]  atorvastatin (LIPITOR) 10 MG tablet Take 1 tablet (10 mg total) by mouth every other day. Patient taking differently:  Take 10 mg by mouth daily. 12/05/19  Yes Gollan, Kathlene November, MD  clopidogrel (PLAVIX) 75 MG tablet TAKE 1 TABLET(75 MG) BY MOUTH DAILY Patient taking differently: Take 75 mg by mouth daily. 08/20/17  Yes Minna Merritts, MD  ezetimibe (ZETIA) 10 MG tablet Take 1 tablet (10 mg total) by mouth daily. 12/05/19  Yes Gollan, Kathlene November, MD  FLUoxetine (PROZAC) 20 MG capsule Take 20 mg by mouth daily.   Yes  [provider]  gabapentin (NEURONTIN) 300 MG capsule Take 300 mg by mouth 3 (three) times daily.  01/24/12  Yes [provider]  insulin detemir (LEVEMIR) 100 UNIT/ML injection Inject 46 Units into the skin at bedtime.   Yes [provider]  insulin lispro (HUMALOG) 100 UNIT/ML injection Inject 0-25 Units into the skin as directed.   Yes [provider]  lisinopril (PRINIVIL,ZESTRIL) 20 MG tablet Take 20 mg by mouth daily.  01/27/13  Yes [provider]  metFORMIN (GLUCOPHAGE-XR) 500 MG 24 hr tablet Take 500 mg by mouth 2 (two) times daily with a meal.   Yes [provider]  metoprolol tartrate (LOPRESSOR) 25 MG tablet Take 25 mg by mouth 2 (two) times daily.   Yes [provider]  pantoprazole (PROTONIX) 40 MG tablet Take 40 mg by mouth daily.   Yes [provider]  tamsulosin (FLOMAX) 0.4 MG CAPS capsule Take 0.4 mg by mouth every evening.   Yes [provider]  Melatonin 500 MCG TBDP Take 500 mcg by mouth at bedtime.    [provider]    Allergies Ibuprofen and Penicillins  Family History  Family history unknown: Yes    Social History Social History   Tobacco Use  . Smoking status: Never Smoker  . Smokeless tobacco: Never Used  Substance Use Topics  . Alcohol use: No    Comment: occasional  . Drug use: No    Review of Systems  Constitutional: No fever/chills Eyes: No visual changes. ENT: No sore throat. Cardiovascular: Denies chest pain. Respiratory: Denies shortness of breath. Gastrointestinal: No abdominal pain.  Positive for nausea, no vomiting.  No diarrhea.  No constipation. Genitourinary: Negative for dysuria.  Positive for urinary frequency. Musculoskeletal: Negative for back pain. Skin: Negative for rash. Neurological: Negative for headaches, focal weakness or numbness.  ____________________________________________   PHYSICAL EXAM:  VITAL SIGNS: ED Triage Vitals   Enc Vitals Group     BP 03/30/20 0307 (!) 175/84     Pulse Rate 03/30/20 0307 79     Resp 03/30/20 0307 16     Temp 03/30/20 0307 (!) 97.4 F (36.3 C)     Temp Source 03/30/20 0307 Oral     SpO2 03/30/20 0307 95 %     Weight 03/30/20 0303 190 lb (86.2 kg)     Height 03/30/20 0303 6' (1.829 m)     Head Circumference --      Peak Flow --      Pain Score 03/30/20 0303 0     Pain Loc --      Pain Edu? --      Excl. in [place]? --     Constitutional: Alert and oriented. Eyes: Conjunctivae are normal. Head: Atraumatic. Nose: No congestion/rhinnorhea. Mouth/Throat: Mucous membranes are moist. Neck: Normal ROM Cardiovascular: Normal rate, regular rhythm. Grossly normal heart sounds.  2+ radial pulses bilaterally. Respiratory: Normal respiratory effort.  No retractions. Lungs CTAB. Gastrointestinal: Soft and mildly tender to palpation to bilateral lower quadrants with no  rebound or guarding. No distention. Genitourinary: deferred Musculoskeletal: No lower extremity tenderness nor edema. Neurologic:  Normal speech and language. No gross focal neurologic deficits are appreciated. Skin:  Skin is warm, dry and intact. No rash noted. Psychiatric: Mood and affect are normal. Speech and behavior are normal.  ____________________________________________   LABS (all labs ordered are listed, but only abnormal results are displayed)  Labs Reviewed  CBC WITH DIFFERENTIAL/PLATELET - Abnormal; Notable for the following components:      Result Value   RDW 16.8 (*)    Monocytes Absolute 2.0 (*)    All other components within normal limits  COMPREHENSIVE METABOLIC PANEL - Abnormal; Notable for the following components:   Sodium 131 (*)    Chloride 97 (*)    Glucose, Bld 190 (*)    Creatinine, Ser 1.41 (*)    Total Protein 8.4 (*)    GFR, Estimated 47 (*)    All other components within normal limits  URINALYSIS, COMPLETE (UACMP) WITH MICROSCOPIC - Abnormal; Notable for the following components:    Color, Urine YELLOW (*)    APPearance CLEAR (*)    Hgb urine dipstick SMALL (*)    All other components within normal limits  RESP PANEL BY RT-PCR (FLU A&B, COVID) ARPGX2  LIPASE, BLOOD  CBC  CREATININE, SERUM  TROPONIN I (HIGH SENSITIVITY)   ____________________________________________  EKG  ED ECG REPORT I, Blake Divine, the attending physician, personally viewed and interpreted this ECG.   Date: 03/30/2020  EKG Time: 3:03  Rate: 79  Rhythm: normal sinus rhythm  Axis: LAD  Intervals:right bundle branch block  ST&T Change: None   PROCEDURES  Procedure(s) performed (including Critical Care):  Procedures   ____________________________________________   INITIAL IMPRESSION / ASSESSMENT AND PLAN / ED COURSE       87 year old male with past history of hypertension, hyperlipidemia, diabetes, CAD status post CABG, GERD, and stroke who presents to the ED for persistent nausea since yesterday with subsequent development of urinary frequency and bilateral lower quadrant abdominal pain.  He does have some mild tenderness on exam and we will further assess with CT scan.  Labs thus far are reassuring, shows stable chronic kidney disease with no other acute abnormality.  EKG shows right bundle branch block similar to previous with no acute ischemic changes, troponin is negative and I doubt cardiac etiology.  UA is pending, we will hydrate with IV fluids and treat with Zofran, patient declines pain medication.  CT scan significant for diverticulitis with small 1.9 cm associated abscess.  He additionally has inflammation tracking up towards his pancreas that would be greater than expected for diverticulitis alone, however her lipase within normal limits.  Given his penicillin allergy, we will treat with cefepime and Flagyl.  Case discussed with Dr. Peyton Najjar of general surgery, who will see the patient in consultation.  Case discussed with hospitalist for admission.       ____________________________________________   FINAL CLINICAL IMPRESSION(S) / ED DIAGNOSES  Final diagnoses:  Diverticulitis  Colonic diverticular abscess     ED Discharge Orders    None       Note:  This document was prepared using Dragon voice recognition software and may include unintentional dictation errors.   Blake Divine, MD 03/30/20 [phone]

## 2020-03-30 NOTE — Progress Notes (Deleted)
Patient arrived on unit from ED.

## 2020-03-30 NOTE — Progress Notes (Signed)
Inpatient Diabetes Program Recommendations  AACE/ADA: New Consensus Statement on Inpatient Glycemic Control (2015)  Target Ranges:  Prepandial:   less than 140 mg/dL      Peak postprandial:   less than 180 mg/dL (1-2 hours)      Critically ill patients:  140 - 180 mg/dL   Results for Randy Frazier, Randy Frazier (MRN [id number]) as of 03/30/2020 09:32  Ref. Range 03/30/2020 03:08  Glucose Latest Ref Range: 70 - 99 mg/dL 190 (H)    To ED with Nausea/ Diverticulitis with Abscess  History: DM  Home DM Meds: Levemir 46 units QHS       Humalog 0-25 units as directed       Metformin XR 500 mg BID  Current Orders: Metformin XR 500 mg BID     MD- Please consider the following:  1. Start Levemir 25 units QHS (50% home dose to start)  2. Start Novolog Moderate Correction Scale/ SSI (0-15 units) TID AC + HS  3. Stop Metformin for now--Can resume when pt ready to d/c home    Endocrinologist: Dr. Gabriel Carina with [facility] Last seen 03/27/2020 (3 days ago)--A1c in January per Dr. Joycie Peek notes was 6.8% Was told to continue the following: Continue Lantus insulin at 46 units daily.  Continue Humalog based on blood sugar readings before the morning and evening meals Take none if sugar is less than 80 Take 15 units if sugar is 81 - 120 Take 20 units if sugar is 121 - 200 Take 25 units if sugar is 201 or higher    --Will follow patient during hospitalization--  Wyn Quaker RN, MSN, CDE Diabetes Coordinator Inpatient Glycemic Control Team Team Pager: [phone] (8a-5p)

## 2020-03-31 DIAGNOSIS — K5720 Diverticulitis of large intestine with perforation and abscess without bleeding: Secondary | ICD-10-CM | POA: Diagnosis not present

## 2020-03-31 DIAGNOSIS — I10 Essential (primary) hypertension: Secondary | ICD-10-CM

## 2020-03-31 DIAGNOSIS — E871 Hypo-osmolality and hyponatremia: Secondary | ICD-10-CM | POA: Diagnosis not present

## 2020-03-31 LAB — BASIC METABOLIC PANEL
Anion gap: 11 (ref 5–15)
BUN: 23 mg/dL (ref 8–23)
CO2: 25 mmol/L (ref 22–32)
Calcium: 9.2 mg/dL (ref 8.9–10.3)
Chloride: 96 mmol/L — ABNORMAL LOW (ref 98–111)
Creatinine, Ser: 1.35 mg/dL — ABNORMAL HIGH (ref 0.61–1.24)
GFR, Estimated: 50 mL/min — ABNORMAL LOW (ref >60)
Glucose, Bld: 255 mg/dL — ABNORMAL HIGH (ref 70–99)
Potassium: 4.3 mmol/L (ref 3.5–5.1)
Sodium: 132 mmol/L — ABNORMAL LOW (ref 135–145)

## 2020-03-31 LAB — GLUCOSE, CAPILLARY
Glucose-Capillary: 140 mg/dL — ABNORMAL HIGH (ref 70–99)
Glucose-Capillary: 230 mg/dL — ABNORMAL HIGH (ref 70–99)
Glucose-Capillary: 167 mg/dL — ABNORMAL HIGH (ref 70–99)
Glucose-Capillary: 202 mg/dL — ABNORMAL HIGH (ref 70–99)

## 2020-03-31 LAB — CBC
HCT: 41.6 % (ref 39.0–52.0)
Hemoglobin: 13.8 g/dL (ref 13.0–17.0)
MCH: 30.4 pg (ref 26.0–34.0)
MCHC: 33.2 g/dL (ref 30.0–36.0)
MCV: 91.6 fL (ref 80.0–100.0)
Platelets: 156 10*3/uL (ref 150–400)
RBC: 4.54 MIL/uL (ref 4.22–5.81)
RDW: 16.8 % — ABNORMAL HIGH (ref 11.5–15.5)
WBC: 6.4 10*3/uL (ref 4.0–10.5)
nRBC: 0 % (ref 0.0–0.2)

## 2020-03-31 NOTE — Progress Notes (Signed)
[facility] Day(s): 1.   Post op day(s):  [name]   Interval History: Patient seen and examined, no acute events or new complaints overnight. Patient reports feeling great.  He denies abdominal pain.  He denies any issues with oral intake.  He was advanced to her through diet and he was having breakfast without any issues.  He did not have any fevers.  There is no pain radiation.  There is no alleviating or aggravating factors.  Patient does have any nausea which it was the main complaint upon arriving to the ED.  Patient said he is in a good mood.  Vital signs in last 24 hours: [min-max] current  Temp:  [97.4 F (36.3 C)-98.1 F (36.7 C)] 97.6 F (36.4 C) (02/19 0735) Pulse Rate:  [54-77] 60 (02/19 0735) Resp:  [16-22] 16 (02/19 0735) BP: (136-188)/(62-97) 154/93 (02/19 0735) SpO2:  [94 %-97 %] 96 % (02/19 0735)     Height: 6' (182.9 cm) Weight: 86.2 kg BMI (Calculated): 25.76   Physical Exam:  Constitutional: alert, cooperative and no distress  Respiratory: breathing non-labored at rest  Cardiovascular: regular rate and sinus rhythm  Gastrointestinal: soft, non-tender, and non-distended  Labs:  CBC Latest Ref Rng & Units 03/30/2020 05/10/2016 05/08/2016  WBC 4.0 - 10.5 K/uL 10.0 5.9 6.2  Hemoglobin 13.0 - 17.0 g/dL 13.8 12.8(L) 12.9(L)  Hematocrit 39.0 - 52.0 % 41.8 38.3(L) 38.7(L)  Platelets 150 - 400 K/uL 186 258 273   CMP Latest Ref Rng & Units 03/30/2020 05/10/2016 05/07/2016  Glucose 70 - 99 mg/dL 190(H) 111(H) 197(H)  BUN 8 - 23 mg/dL 21 19 -  Creatinine 0.61 - 1.24 mg/dL 1.41(H) 1.41(H) -  Sodium 135 - 145 mmol/L 131(L) 135 140  Potassium 3.5 - 5.1 mmol/L 4.3 4.0 4.2  Chloride 98 - 111 mmol/L 97(L) 100(L) -  CO2 22 - 32 mmol/L 24 26 -  Calcium 8.9 - 10.3 mg/dL 9.4 9.0 -  Total Protein 6.5 - 8.1 g/dL 8.4(H) 6.7 -  Total Bilirubin 0.3 - 1.2 mg/dL 0.9 0.9 -  Alkaline Phos 38 - 126 U/L 68 47 -  AST 15 - 41 U/L 29 39 -  ALT 0 - 44 U/L 30 34 -     Imaging studies: No new pertinent imaging studies   Assessment/Plan:  87 y.o. male with complicated diverticulitis with 1.9 cm intramural abscess, complicated by pertinent comorbidities including diabetes mellitus, hypertension, chronic kidney disease.  Patient responding well to IV antibiotic therapy.  There is no pain.  There is no nausea or vomiting.  Patient tolerated heart healthy diet.  There has been no fever.  There is no leukocyte count.  From surgery standpoint if patient tolerated oral antibiotic therapy he can be transition to oral therapy.  There is no surgical indication at this moment.  Arnold Long, MD

## 2020-03-31 NOTE — Progress Notes (Signed)
PROGRESS NOTE    Randy Frazier  RCV:[id number] DOB: 1927/05/31 DOA: 03/30/2020 PCP: Valera Castle, MD    Brief Narrative:  Randy Frazier  is a 87 y.o. male with a known history of hypertension, hyperlipidemia, diabetes, coronary artery disease status post CABG, GERD and CVA is being admitted for acute diverticulitis with abscess CT abdomen significant for diverticulitis with small 1.9 cm abscess, inflammation tracking up towards his pancreas.  Case was discussed with surgery Dr. Peyton Najjar who recommended admission and IV antibiotic   Consultants:   surgery  Procedures: CT  Antimicrobials:   cipro and metronidazole   Subjective: Starting to feel better.  No abdominal pain.  No nausea or vomiting.  Tolerating diet so far.  Objective: Vitals:   03/31/20 0028 03/31/20 0405 03/31/20 0735 03/31/20 1140  BP: 137/64 136/70 (!) 154/93 (!) 147/77  Pulse: 61 62 60 (!) 53  Resp: 17 18 16 16   Temp: (!) 97.4 F (36.3 C) 97.6 F (36.4 C) 97.6 F (36.4 C) (!) 97.5 F (36.4 C)  TempSrc:   Oral Oral  SpO2: 95% 95% 96% 97%  Weight:      Height:        Intake/Output Summary (Last 24 hours) at 03/31/2020 1505 Last data filed at 03/31/2020 1006 Gross per 24 hour  Intake 240 ml  Output 800 ml  Net -560 ml   Filed Weights   03/30/20 0303  Weight: 86.2 kg    Examination:  General exam: Appears calm and comfortable  Respiratory system: Clear to auscultation. Respiratory effort normal. Cardiovascular system: S1 & S2 heard, RRR. No JVD, murmurs, rubs, gallops or clicks.  Gastrointestinal system: Abdomen is nondistended, soft and nontender. Normal bowel sounds heard. Central nervous system: Alert and oriented. No focal neurological deficits. Extremities: no edema Psychiatry: Judgement and insight appear normal. Mood & affect appropriate.     Data Reviewed: I have personally reviewed following labs and imaging studies  CBC: Recent Labs  Lab 03/30/20 0308  03/31/20 1037  WBC 10.0 6.4  NEUTROABS 4.9  --   HGB 13.8 13.8  HCT 41.8 41.6  MCV 91.7 91.6  PLT 186 102   Basic Metabolic Panel: Recent Labs  Lab 03/30/20 0308 03/31/20 1037  NA 131* 132*  K 4.3 4.3  CL 97* 96*  CO2 24 25  GLUCOSE 190* 255*  BUN 21 23  CREATININE 1.41* 1.35*  CALCIUM 9.4 9.2   GFR: Estimated Creatinine Clearance: 39.9 mL/min (A) (by C-G formula based on SCr of 1.35 mg/dL (H)). Liver Function Tests: Recent Labs  Lab 03/30/20 0308  AST 29  ALT 30  ALKPHOS 68  BILITOT 0.9  PROT 8.4*  ALBUMIN 4.3   Recent Labs  Lab 03/30/20 0308  LIPASE 32   No results for input(s): AMMONIA in the last 168 hours. Coagulation Profile: No results for input(s): INR, PROTIME in the last 168 hours. Cardiac Enzymes: No results for input(s): CKTOTAL, CKMB, CKMBINDEX, TROPONINI in the last 168 hours. BNP (last 3 results) No results for input(s): PROBNP in the last 8760 hours. HbA1C: Recent Labs    03/30/20 0308  HGBA1C 7.0*   CBG: Recent Labs  Lab 03/30/20 1331 03/30/20 1631 03/30/20 2034 03/31/20 0737 03/31/20 1215  GLUCAP 303* 145* 173* 140* 230*   Lipid Profile: No results for input(s): CHOL, HDL, LDLCALC, TRIG, CHOLHDL, LDLDIRECT in the last 72 hours. Thyroid Function Tests: No results for input(s): TSH, T4TOTAL, FREET4, T3FREE, THYROIDAB in the last 72 hours. Anemia Panel: No  results for input(s): VITAMINB12, FOLATE, FERRITIN, TIBC, IRON, RETICCTPCT in the last 72 hours. Sepsis Labs: No results for input(s): PROCALCITON, LATICACIDVEN in the last 168 hours.  Recent Results (from the past 240 hour(s))  Resp Panel by RT-PCR (Flu A&B, Covid) Nasopharyngeal Swab     Status: None   Collection Time: 03/30/20  9:49 AM   Specimen: Nasopharyngeal Swab; Nasopharyngeal(NP) swabs in vial transport medium  Result Value Ref Range Status   SARS Coronavirus 2 by RT PCR NEGATIVE NEGATIVE Final    Comment: (NOTE) SARS-CoV-2 target nucleic acids are NOT  DETECTED.  The SARS-CoV-2 RNA is generally detectable in upper respiratory specimens during the acute phase of infection. The lowest concentration of SARS-CoV-2 viral copies this assay can detect is 138 copies/mL. A negative result does not preclude SARS-Cov-2 infection and should not be used as the sole basis for treatment or other patient management decisions. A negative result may occur with  improper specimen collection/handling, submission of specimen other than nasopharyngeal swab, presence of viral mutation(s) within the areas targeted by this assay, and inadequate number of viral copies(<138 copies/mL). A negative result must be combined with clinical observations, patient history, and epidemiological information. The expected result is Negative.  Fact Sheet for Patients:  EntrepreneurPulse.com.au  Fact Sheet for Healthcare Providers:  IncredibleEmployment.be  This test is no t yet approved or cleared by the [place] FDA and  has been authorized for detection and/or diagnosis of SARS-CoV-2 by FDA under an Emergency Use Authorization (EUA). This EUA will remain  in effect (meaning this test can be used) for the duration of the COVID-19 declaration under Section 564(b)(1) of the Act, 21 U.S.C.section 360bbb-3(b)(1), unless the authorization is terminated  or revoked sooner.       Influenza A by PCR NEGATIVE NEGATIVE Final   Influenza B by PCR NEGATIVE NEGATIVE Final    Comment: (NOTE) The Xpert Xpress SARS-CoV-2/FLU/RSV plus assay is intended as an aid in the diagnosis of influenza from Nasopharyngeal swab specimens and should not be used as a sole basis for treatment. Nasal washings and aspirates are unacceptable for Xpert Xpress SARS-CoV-2/FLU/RSV testing.  Fact Sheet for Patients: EntrepreneurPulse.com.au  Fact Sheet for Healthcare Providers: IncredibleEmployment.be  This test is not yet  approved or cleared by the [place] FDA and has been authorized for detection and/or diagnosis of SARS-CoV-2 by FDA under an Emergency Use Authorization (EUA). This EUA will remain in effect (meaning this test can be used) for the duration of the COVID-19 declaration under Section 564(b)(1) of the Act, 21 U.S.C. section 360bbb-3(b)(1), unless the authorization is terminated or revoked.  Performed at [facility], [address] [place], [place] [address]          Radiology Studies: CT Abdomen Pelvis W Contrast  Result Date: 03/30/2020 CLINICAL DATA:  Abdominal pain, nausea EXAM: CT ABDOMEN AND PELVIS WITH CONTRAST TECHNIQUE: Multidetector CT imaging of the abdomen and pelvis was performed using the standard protocol following bolus administration of intravenous contrast. CONTRAST:  54mL OMNIPAQUE IOHEXOL 300 MG/ML  SOLN COMPARISON:  08/14/2015 FINDINGS: Lower chest: No acute abnormality.  Coronary artery calcifications. Hepatobiliary: No solid liver abnormality is seen. Gallstone near the gallbladder neck. No gallbladder wall thickening, or biliary dilatation. Pancreas: Mild adjacent fat stranding about the pancreatic head (series 2, image 39). No pancreatic ductal dilatation. Spleen: Normal in size without significant abnormality. Adrenals/Urinary Tract: Adrenal glands are unremarkable. Kidneys are normal, without renal calculi, solid lesion, or hydronephrosis. Bladder is unremarkable. Stomach/Bowel: Stomach  is within normal limits. Appendix appears normal. There is sigmoid diverticulosis with wall thickening and fat stranding about the mid sigmoid, with a rim enhancing small fluid collection anteriorly measuring 1.9 x 1.3 cm (series 2, image 77). There is additional fat stranding in the central small bowel mesentery. Vascular/Lymphatic: Aortic atherosclerosis. Enlarged celiac axis and gastrohepatic ligament lymph nodes measuring up to 1.8 x 1.3 cm (series 2, image 27). Enlarged  bilateral iliac and pelvic sidewall lymph nodes measuring up to 1.9 x 1.5 cm (series 2, image 72). Reproductive: Prostatomegaly. Other: No abdominal wall hernia or abnormality. Trace ascites throughout the abdomen and pelvis. Fat stranding in the central small bowel mesentery and retroperitoneum (series 2, image 43, 58). Musculoskeletal: No acute or significant osseous findings. IMPRESSION: 1. There is sigmoid diverticulosis with wall thickening and fat stranding about the mid sigmoid, with a rim enhancing small fluid collection anteriorly measuring 1.9 cm. Findings are consistent with acute diverticulitis complicated by intramural abscess. No evidence of perforation. 2. There is additional fat stranding in the central small bowel mesentery and retroperitoneum, generally greater than would be expected in the setting of diverticulitis and also appearing to surround the pancreatic head. These findings suggest acute pancreatitis, perhaps true and unrelated to diverticulitis. 3. Enlarged retroperitoneal, iliac, and pelvic sidewall lymph nodes, of uncertain significance, most likely reactive. 4. Trace ascites throughout the abdomen and pelvis. 5. Prostatomegaly. Cholelithiasis without evidence of acute cholecystitis. 6. Coronary artery disease. Aortic Atherosclerosis (ICD10-I70.0). Electronically Signed   By: Eddie Candle M.D.   On: 03/30/2020 08:28        Scheduled Meds: . amLODipine  10 mg Oral Daily  . atorvastatin  10 mg Oral QODAY  . clopidogrel  75 mg Oral Daily  . enoxaparin (LOVENOX) injection  40 mg Subcutaneous Q24H  . ezetimibe  10 mg Oral Daily  . FLUoxetine  20 mg Oral Daily  . gabapentin  300 mg Oral TID  . insulin aspart  0-15 Units Subcutaneous TID WC  . insulin aspart  0-5 Units Subcutaneous QHS  . insulin detemir  25 Units Subcutaneous QHS  . lisinopril  20 mg Oral Daily  . melatonin  2.5 mg Oral QHS  . metoprolol tartrate  25 mg Oral BID  . pantoprazole  40 mg Oral Daily  .  tamsulosin  0.4 mg Oral QPM   Continuous Infusions: . sodium chloride 50 mL/hr at 03/30/20 1049  . ciprofloxacin 400 mg (03/31/20 1005)  . metronidazole 500 mg (03/31/20 0835)    Assessment & Plan:   Active Problems:   Colonic diverticular abscess   87 year old male with a known history of known history of hypertension, hyperlipidemia, diabetes, coronary artery disease status post CABG, GERD and CVA is being admitted for acute diverticulitis with abscess.    Acute sigmoid diverticulosis and diverticulitis with 1.9 cm intramural abscess Continue IV Cipro and Flagyl Surgery following Conservative management for now Continue IV antibiotics Repeat CT scan in a.m. No surgical indication at this point   Diabetes mellitus Labile overall  Continue R-ISS  continue Lantus Hold Metformin   Essential hypertension Stable Continue beta-blockers, lisinopril, Norvasc  Hyponatremia-likely 2/2 hypovolemia. Improving with ivf. Na 132 today  History of CVA and CAD Continue Plavix and statin   DVT prophylaxis: Lovenox Code Status: Full Family Communication: None at bedside  Status is: Inpatient  Remains inpatient appropriate because:Inpatient level of care appropriate due to severity of illness   Dispo: The patient is from: Home  Anticipated d/c is to: Home              Anticipated d/c date is: 2 days              Patient currently is not medically stable to d/c.   Difficult to place patient No            LOS: 1 day   Time spent: 35 min with >50% on coc    Nolberto Hanlon, MD [facility] Pager [phone]  If 7PM-7AM, please contact night-coverage 03/31/2020, 3:05 PM

## 2020-04-01 ENCOUNTER — Inpatient Hospital Stay: Payer: Medicare Other

## 2020-04-01 ENCOUNTER — Encounter: Payer: Self-pay | Admitting: Internal Medicine

## 2020-04-01 DIAGNOSIS — K5720 Diverticulitis of large intestine with perforation and abscess without bleeding: Secondary | ICD-10-CM | POA: Diagnosis not present

## 2020-04-01 DIAGNOSIS — I10 Essential (primary) hypertension: Secondary | ICD-10-CM | POA: Diagnosis not present

## 2020-04-01 DIAGNOSIS — Z8673 Personal history of transient ischemic attack (TIA), and cerebral infarction without residual deficits: Secondary | ICD-10-CM | POA: Diagnosis not present

## 2020-04-01 DIAGNOSIS — E871 Hypo-osmolality and hyponatremia: Secondary | ICD-10-CM | POA: Diagnosis not present

## 2020-04-01 LAB — CBC
HCT: 40.6 % (ref 39.0–52.0)
Hemoglobin: 13.6 g/dL (ref 13.0–17.0)
MCH: 30.6 pg (ref 26.0–34.0)
MCHC: 33.5 g/dL (ref 30.0–36.0)
MCV: 91.4 fL (ref 80.0–100.0)
Platelets: 170 10*3/uL (ref 150–400)
RBC: 4.44 MIL/uL (ref 4.22–5.81)
RDW: 16.6 % — ABNORMAL HIGH (ref 11.5–15.5)
WBC: 5.9 10*3/uL (ref 4.0–10.5)
nRBC: 0 % (ref 0.0–0.2)

## 2020-04-01 LAB — GLUCOSE, CAPILLARY
Glucose-Capillary: 147 mg/dL — ABNORMAL HIGH (ref 70–99)
Glucose-Capillary: 199 mg/dL — ABNORMAL HIGH (ref 70–99)
Glucose-Capillary: 176 mg/dL — ABNORMAL HIGH (ref 70–99)

## 2020-04-01 LAB — BASIC METABOLIC PANEL
Anion gap: 10 (ref 5–15)
BUN: 21 mg/dL (ref 8–23)
CO2: 24 mmol/L (ref 22–32)
Calcium: 9 mg/dL (ref 8.9–10.3)
Chloride: 99 mmol/L (ref 98–111)
Creatinine, Ser: 1.32 mg/dL — ABNORMAL HIGH (ref 0.61–1.24)
GFR, Estimated: 51 mL/min — ABNORMAL LOW (ref >60)
Glucose, Bld: 159 mg/dL — ABNORMAL HIGH (ref 70–99)
Potassium: 4.5 mmol/L (ref 3.5–5.1)
Sodium: 133 mmol/L — ABNORMAL LOW (ref 135–145)

## 2020-04-01 MED ORDER — LACTINEX PO CHEW: 1.0000 | CHEWABLE_TABLET | Freq: Three times a day (TID) | ORAL | 0 refills | Status: AC

## 2020-04-01 MED ORDER — IOHEXOL 300 MG/ML  SOLN
100.0000 mL | Freq: Once | INTRAMUSCULAR | Status: AC | PRN
  Administered 2020-04-01: 100 mL via INTRAVENOUS

## 2020-04-01 MED ORDER — CIPROFLOXACIN HCL 500 MG PO TABS: 500.0000 mg | ORAL_TABLET | Freq: Two times a day (BID) | ORAL | 0 refills | Status: AC

## 2020-04-01 MED ORDER — METRONIDAZOLE 500 MG PO TABS: 500.0000 mg | ORAL_TABLET | Freq: Three times a day (TID) | ORAL | 0 refills | Status: AC

## 2020-04-01 NOTE — Progress Notes (Signed)
PROGRESS NOTE    Randy Frazier  VHQ:[id number] DOB: 05/23/1927 DOA: 03/30/2020 PCP: Valera Castle, MD    Brief Narrative:  Randy Frazier  is a 87 y.o. male with a known history of hypertension, hyperlipidemia, diabetes, coronary artery disease status post CABG, GERD and CVA is being admitted for acute diverticulitis with abscess CT abdomen significant for diverticulitis with small 1.9 cm abscess, inflammation tracking up towards his pancreas.  Case was discussed with surgery Dr. Peyton Najjar who recommended admission and IV antibiotic  2/20-tolerating po intake without any issues. No bm.  Consultants:   surgery  Procedures: CT  Antimicrobials:   cipro and metronidazole   Subjective: No new complaints  Objective: Vitals:   03/31/20 2331 04/01/20 0418 04/01/20 0740 04/01/20 1154  BP: 135/72 128/73 (!) 164/79 (!) 150/78  Pulse: (!) 53 (!) 49 66 (!) 50  Resp: 18 18 16 18   Temp: 97.6 F (36.4 C) (!) 97.5 F (36.4 C) (!) 97.5 F (36.4 C)   TempSrc:      SpO2: 95% 95% 98% 98%  Weight:      Height:        Intake/Output Summary (Last 24 hours) at 04/01/2020 1404 Last data filed at 04/01/2020 1351 Gross per 24 hour  Intake 340 ml  Output 900 ml  Net -560 ml   Filed Weights   03/30/20 0303  Weight: 86.2 kg    Examination:  Calm, comfortable CTA, no wheeze rales rhonchi's Regular S1-S2 no gallops Soft benign positive bowel sounds No edema Alert oriented x3 grossly intact    Data Reviewed: I have personally reviewed following labs and imaging studies  CBC: Recent Labs  Lab 03/30/20 0308 03/31/20 1037 04/01/20 0705  WBC 10.0 6.4 5.9  NEUTROABS 4.9  --   --   HGB 13.8 13.8 13.6  HCT 41.8 41.6 40.6  MCV 91.7 91.6 91.4  PLT 186 156 413   Basic Metabolic Panel: Recent Labs  Lab 03/30/20 0308 03/31/20 1037 04/01/20 0705  NA 131* 132* 133*  K 4.3 4.3 4.5  CL 97* 96* 99  CO2 24 25 24   GLUCOSE 190* 255* 159*  BUN 21 23 21   CREATININE  1.41* 1.35* 1.32*  CALCIUM 9.4 9.2 9.0   GFR: Estimated Creatinine Clearance: 40.8 mL/min (A) (by C-G formula based on SCr of 1.32 mg/dL (H)). Liver Function Tests: Recent Labs  Lab 03/30/20 0308  AST 29  ALT 30  ALKPHOS 68  BILITOT 0.9  PROT 8.4*  ALBUMIN 4.3   Recent Labs  Lab 03/30/20 0308  LIPASE 32   No results for input(s): AMMONIA in the last 168 hours. Coagulation Profile: No results for input(s): INR, PROTIME in the last 168 hours. Cardiac Enzymes: No results for input(s): CKTOTAL, CKMB, CKMBINDEX, TROPONINI in the last 168 hours. BNP (last 3 results) No results for input(s): PROBNP in the last 8760 hours. HbA1C: Recent Labs    03/30/20 0308  HGBA1C 7.0*   CBG: Recent Labs  Lab 03/31/20 1215 03/31/20 1641 03/31/20 2045 04/01/20 0733 04/01/20 1149  GLUCAP 230* 167* 202* 147* 199*   Lipid Profile: No results for input(s): CHOL, HDL, LDLCALC, TRIG, CHOLHDL, LDLDIRECT in the last 72 hours. Thyroid Function Tests: No results for input(s): TSH, T4TOTAL, FREET4, T3FREE, THYROIDAB in the last 72 hours. Anemia Panel: No results for input(s): VITAMINB12, FOLATE, FERRITIN, TIBC, IRON, RETICCTPCT in the last 72 hours. Sepsis Labs: No results for input(s): PROCALCITON, LATICACIDVEN in the last 168 hours.  Recent Results (from  the past 240 hour(s))  Resp Panel by RT-PCR (Flu A&B, Covid) Nasopharyngeal Swab     Status: None   Collection Time: 03/30/20  9:49 AM   Specimen: Nasopharyngeal Swab; Nasopharyngeal(NP) swabs in vial transport medium  Result Value Ref Range Status   SARS Coronavirus 2 by RT PCR NEGATIVE NEGATIVE Final    Comment: (NOTE) SARS-CoV-2 target nucleic acids are NOT DETECTED.  The SARS-CoV-2 RNA is generally detectable in upper respiratory specimens during the acute phase of infection. The lowest concentration of SARS-CoV-2 viral copies this assay can detect is 138 copies/mL. A negative result does not preclude SARS-Cov-2 infection and  should not be used as the sole basis for treatment or other patient management decisions. A negative result may occur with  improper specimen collection/handling, submission of specimen other than nasopharyngeal swab, presence of viral mutation(s) within the areas targeted by this assay, and inadequate number of viral copies(<138 copies/mL). A negative result must be combined with clinical observations, patient history, and epidemiological information. The expected result is Negative.  Fact Sheet for Patients:  EntrepreneurPulse.com.au  Fact Sheet for Healthcare Providers:  IncredibleEmployment.be  This test is no t yet approved or cleared by the [place] FDA and  has been authorized for detection and/or diagnosis of SARS-CoV-2 by FDA under an Emergency Use Authorization (EUA). This EUA will remain  in effect (meaning this test can be used) for the duration of the COVID-19 declaration under Section 564(b)(1) of the Act, 21 U.S.C.section 360bbb-3(b)(1), unless the authorization is terminated  or revoked sooner.       Influenza A by PCR NEGATIVE NEGATIVE Final   Influenza B by PCR NEGATIVE NEGATIVE Final    Comment: (NOTE) The Xpert Xpress SARS-CoV-2/FLU/RSV plus assay is intended as an aid in the diagnosis of influenza from Nasopharyngeal swab specimens and should not be used as a sole basis for treatment. Nasal washings and aspirates are unacceptable for Xpert Xpress SARS-CoV-2/FLU/RSV testing.  Fact Sheet for Patients: EntrepreneurPulse.com.au  Fact Sheet for Healthcare Providers: IncredibleEmployment.be  This test is not yet approved or cleared by the [place] FDA and has been authorized for detection and/or diagnosis of SARS-CoV-2 by FDA under an Emergency Use Authorization (EUA). This EUA will remain in effect (meaning this test can be used) for the duration of the COVID-19 declaration  under Section 564(b)(1) of the Act, 21 U.S.C. section 360bbb-3(b)(1), unless the authorization is terminated or revoked.  Performed at [facility], [address], [place], [place] [address]          Radiology Studies: No results found.      Scheduled Meds: . amLODipine  10 mg Oral Daily  . atorvastatin  10 mg Oral QODAY  . clopidogrel  75 mg Oral Daily  . enoxaparin (LOVENOX) injection  40 mg Subcutaneous Q24H  . ezetimibe  10 mg Oral Daily  . FLUoxetine  20 mg Oral Daily  . gabapentin  300 mg Oral TID  . insulin aspart  0-15 Units Subcutaneous TID WC  . insulin aspart  0-5 Units Subcutaneous QHS  . insulin detemir  25 Units Subcutaneous QHS  . lisinopril  20 mg Oral Daily  . melatonin  2.5 mg Oral QHS  . metoprolol tartrate  25 mg Oral BID  . pantoprazole  40 mg Oral Daily  . tamsulosin  0.4 mg Oral QPM   Continuous Infusions: . sodium chloride 50 mL/hr at 03/30/20 1049  . ciprofloxacin 400 mg (04/01/20 1201)  . metronidazole 500 mg (  04/01/20 0915)    Assessment & Plan:   Active Problems:   Colonic diverticular abscess   87 year old male with a known history of known history of hypertension, hyperlipidemia, diabetes, coronary artery disease status post CABG, GERD and CVA is being admitted for acute diverticulitis with abscess.    Acute sigmoid diverticulosis and diverticulitis with 1.9 cm intramural abscess Continue IV Cipro and Flagyl Surgery following Conservative management for now 2/20 -we will obtain repeat CT scan to see if findings are stable  Continue IV antibiotics  No surgical indication at this time  Will need to be discharged on p.o. antibiotics with Cipro and metronidazole for 14 days  We will add probiotics to regimen     Diabetes mellitus Overall BCG stable Continue R-ISS Continue Lantus Hold Metformin    Essential hypertension Stable at times mildly elevated Continue beta-blockers, lisinopril,  Norvasc   Hyponatremia-likely 2/2 hypovolemia. Improved with IV fluids, sodium 133 Continue to monitor periodically  History of CVA and CAD Continue Plavix and statin   DVT prophylaxis: Lovenox Code Status: Full Family Communication: None at bedside  Status is: Inpatient  Remains inpatient appropriate because:Inpatient level of care appropriate due to severity of illness   Dispo: The patient is from: Home              Anticipated d/c is to: Home              Anticipated d/c date is: 1 days              Patient currently is not medically stable to d/c.    Difficult to place patient No            LOS: 2 days   Time spent: 35 min with >50% on coc    Nolberto Hanlon, MD [facility] Pager [phone]  If 7PM-7AM, please contact night-coverage 04/01/2020, 2:04 PM

## 2020-04-01 NOTE — Progress Notes (Signed)
Patient is stable and ready for discharge going home and oral ABT. Patient's IV removed. Writer went over discharge paperwork with patient and he verbalized understanding about making upcoming appt and picking up his medications at the pharmacy. Patient packed his belongings and dressed hisself. Patient was transported via [facility] to patient's car parked in the ED.

## 2020-04-01 NOTE — Progress Notes (Signed)
[facility] Day(s): 2.   Post op day(s):  [name]   Interval History: Patient seen and examined, no acute events or new complaints overnight. Patient reports feeling well.  He denies abdominal pain.  He reported tolerating diet.  There is no pain radiation.  There is no aggravating factor.  Alleviating factor was IV antibiotic therapy.  Vital signs in last 24 hours: [min-max] current  Temp:  [97.5 F (36.4 C)-97.8 F (36.6 C)] 97.5 F (36.4 C) (02/20 0740) Pulse Rate:  [49-66] 66 (02/20 0740) Resp:  [16-18] 16 (02/20 0740) BP: (120-164)/(68-94) 164/79 (02/20 0740) SpO2:  [95 %-98 %] 98 % (02/20 0740)     Height: 6' (182.9 cm) Weight: 86.2 kg BMI (Calculated): 25.76   Physical Exam:  Constitutional: alert, cooperative and no distress  Respiratory: breathing non-labored at rest  Cardiovascular: regular rate and sinus rhythm  Gastrointestinal: soft, non-tender, and non-distended  Labs:  CBC Latest Ref Rng & Units 04/01/2020 03/31/2020 03/30/2020  WBC 4.0 - 10.5 K/uL 5.9 6.4 10.0  Hemoglobin 13.0 - 17.0 g/dL 13.6 13.8 13.8  Hematocrit 39.0 - 52.0 % 40.6 41.6 41.8  Platelets 150 - 400 K/uL 170 156 186   CMP Latest Ref Rng & Units 04/01/2020 03/31/2020 03/30/2020  Glucose 70 - 99 mg/dL 159(H) 255(H) 190(H)  BUN 8 - 23 mg/dL 21 23 21   Creatinine 0.61 - 1.24 mg/dL 1.32(H) 1.35(H) 1.41(H)  Sodium 135 - 145 mmol/L 133(L) 132(L) 131(L)  Potassium 3.5 - 5.1 mmol/L 4.5 4.3 4.3  Chloride 98 - 111 mmol/L 99 96(L) 97(L)  CO2 22 - 32 mmol/L 24 25 24   Calcium 8.9 - 10.3 mg/dL 9.0 9.2 9.4  Total Protein 6.5 - 8.1 g/dL - - 8.4(H)  Total Bilirubin 0.3 - 1.2 mg/dL - - 0.9  Alkaline Phos 38 - 126 U/L - - 68  AST 15 - 41 U/L - - 29  ALT 0 - 44 U/L - - 30    Imaging studies: CT of abdomen and pelvis shows resolution of abscess with improve fat stranding on sigmoid colon. I personally evaluated the images.    Assessment/Plan:  87 y.o.malewith complicated diverticulitis with  1.9 cm intramural abscess, complicated by pertinent comorbidities includingdiabetes mellitus, hypertension, chronic kidney disease.  There is resolution of sigmoid colon wall abscess. Patient with no abdominal pain, tolerating diet, no WBC count. Patient can be discharged with oral antibiotic therapy. No surgical management indicated.   Arnold Long, MD

## 2020-04-01 NOTE — Plan of Care (Signed)

## 2020-04-01 NOTE — Discharge Summary (Signed)
Randy Frazier ERX:[id number] DOB: September 25, 1927 DOA: 03/30/2020  PCP: Valera Castle, MD  Admit date: 03/30/2020 Discharge date: 04/01/2020  Admitted From: home Disposition:  home  Recommendations for Outpatient Follow-up:  1. Follow up with PCP in 1 week 2. Please obtain BMP/CBC in one week 3. Surgery Dr. Windell Moment in 2 weeks     Discharge Condition:Stable CODE STATUS:full  Diet recommendation: Heart Healthy  Brief/Interim Summary: Per HPI: WilliamMitchellis a87 y.o.malewith a known history of hypertension, hyperlipidemia, diabetes, coronary artery disease status post CABG, GERD and CVA presented with persistent nausea and lower abdominal pain.  He had a CT scan of the abdomen significant for diverticulitis with small 1.9 cm abscess, inflammation tracking up towards his pancreas.  General surgery was consulted patient was started on ciprofloxacin and IV metronidazole.  His diet was advanced.  A repeat CT today reveals improvement and general surgery was okay with patient being discharged home.  Acute sigmoid diverticulosis and diverticulitis with 1.9 cm intramural abscess Continue IV Cipro and Flagyl... On discharge needs to be discharged on p.o. Cipro and Flagyl to complete 14 days Surgery following as inpatient and were okay based on improvement of his CT today for patient to be discharged home Tolerating p.o. intake and is asymptomatic without any further nausea or abdominal pain. He was treated here with conservative therapy with antibiotics.    Diabetes mellitus Continue home meds    Essential hypertension Continue home with beta-blockers, lisinopril, Norvasc   Hyponatremia-likely 2/2 hypovolemia. Improved with IV fluids, sodium 133 today   History of CVA and CAD Continue Plavix and statin    Discharge Diagnoses:  Active Problems:   Colonic diverticular abscess    Discharge Instructions  Discharge Instructions    Call MD for:   severe uncontrolled pain   Complete by: As directed    Call MD for:  temperature >100.4   Complete by: As directed    Diet - low sodium heart healthy   Complete by: As directed    Increase activity slowly   Complete by: As directed      Allergies as of 04/01/2020      Reactions   Ibuprofen Swelling   Penicillins Swelling   lips      Medication List    TAKE these medications   amLODipine 10 MG tablet Commonly known as: NORVASC TAKE 1 TABLET BY MOUTH EVERY DAY   aspirin EC 81 MG tablet Take 81 mg by mouth.   atorvastatin 10 MG tablet Commonly known as: LIPITOR Take 1 tablet (10 mg total) by mouth every other day. What changed: when to take this   ciprofloxacin 500 MG tablet Commonly known as: CIPRO Take 1 tablet (500 mg total) by mouth 2 (two) times daily for 14 days.   clopidogrel 75 MG tablet Commonly known as: PLAVIX TAKE 1 TABLET(75 MG) BY MOUTH DAILY What changed: See the new instructions.   ezetimibe 10 MG tablet Commonly known as: ZETIA Take 1 tablet (10 mg total) by mouth daily.   FLUoxetine 20 MG capsule Commonly known as: PROZAC Take 20 mg by mouth daily.   gabapentin 300 MG capsule Commonly known as: NEURONTIN Take 300 mg by mouth 3 (three) times daily.   insulin detemir 100 UNIT/ML injection Commonly known as: LEVEMIR Inject 46 Units into the skin at bedtime.   insulin lispro 100 UNIT/ML injection Commonly known as: HUMALOG Inject 0-25 Units into the skin as directed.   lactobacillus acidophilus & bulgar chewable tablet Chew 1  tablet by mouth 3 (three) times daily with meals for 14 days.   lisinopril 20 MG tablet Commonly known as: ZESTRIL Take 20 mg by mouth daily.   Melatonin 500 MCG Tbdp Take 500 mcg by mouth at bedtime.   metFORMIN 500 MG 24 hr tablet Commonly known as: GLUCOPHAGE-XR Take 500 mg by mouth 2 (two) times daily with a meal.   metoprolol tartrate 25 MG tablet Commonly known as: LOPRESSOR Take 25 mg by mouth 2 (two)  times daily.   metroNIDAZOLE 500 MG tablet Commonly known as: Flagyl Take 1 tablet (500 mg total) by mouth 3 (three) times daily for 14 days.   pantoprazole 40 MG tablet Commonly known as: PROTONIX Take 40 mg by mouth daily.   tamsulosin 0.4 MG Caps capsule Commonly known as: FLOMAX Take 0.4 mg by mouth every evening.       Follow-up Information    Kym Groom Guy Begin, MD Follow up in 1 week(s).   Specialty: Family Medicine Contact information: [place] [address] [phone]        Herbert Pun, MD Follow up in 2 week(s).   Specialty: General Surgery Contact information: [place] [address] [phone]              Allergies  Allergen Reactions  . Ibuprofen Swelling  . Penicillins Swelling    lips    Consultations:  General surgery   Procedures/Studies: CT ABDOMEN PELVIS W CONTRAST  Result Date: 04/01/2020 CLINICAL DATA:  87 year old male with abdominal pain. EXAM: CT ABDOMEN AND PELVIS WITH CONTRAST TECHNIQUE: Multidetector CT imaging of the abdomen and pelvis was performed using the standard protocol following bolus administration of intravenous contrast. CONTRAST:  190mL OMNIPAQUE IOHEXOL 300 MG/ML  SOLN COMPARISON:  CT abdomen pelvis dated 03/30/2020. FINDINGS: Lower chest: Minimal bibasilar atelectasis. The visualized lung bases are otherwise clear. There is coronary vascular calcification. No intra-abdominal free air or free fluid. Hepatobiliary: Probable mild fatty liver. There is a 3.7 x 2.6 cm nodular enhancing lesion in the left lobe of the liver (segment IV a) which is not characterized on this CT. Further evaluation with MRI without and with contrast is recommended. No intrahepatic biliary dilatation. Multiple small gallstones. No pericholecystic fluid or evidence of acute cholecystitis by CT. Pancreas: The pancreas is unremarkable. Inflammatory changes of the mesentery as seen on the prior CT  most consistent with panniculitis. Pancreatitis is less likely. Spleen: Normal in size without focal abnormality. Adrenals/Urinary Tract: The adrenal glands unremarkable. Bilateral renal cysts measure up to 9 cm on the right. There is no hydronephrosis on either side. There is symmetric enhancement and excretion of contrast by both kidneys. The visualized ureters and urinary bladder appear unremarkable. Stomach/Bowel: There is sigmoid diverticulosis. Significantly improved inflammatory changes of and anterior sigmoid diverticula compared to prior CT. No abscess or perforation. There is moderate amount of stool throughout the colon. There is no bowel obstruction. The appendix is normal. Vascular/Lymphatic: Advanced aortoiliac atherosclerotic disease. The IVC is unremarkable. No portal gas. Multiple top-normal mesenteric lymph nodes. No adenopathy. Reproductive: The prostate gland is mildly enlarged measuring 5.5 cm in transverse axial diameter. The seminal vesicles are symmetric. Other: None Musculoskeletal: Degenerative changes of the spine. No acute osseous pathology. IMPRESSION: 1. Significantly improved inflammatory changes of the sigmoid diverticula compared to the prior CT. No abscess or perforation. 2. Mesenteric panniculitis. 3. Cholelithiasis. 4. Indeterminate lesion in the left lobe of the liver. Further evaluation with MRI is recommended. 5.  Aortic Atherosclerosis (ICD10-I70.0). Electronically Signed   By: Anner Crete M.D.   On: 04/01/2020 15:41   CT Abdomen Pelvis W Contrast  Result Date: 03/30/2020 CLINICAL DATA:  Abdominal pain, nausea EXAM: CT ABDOMEN AND PELVIS WITH CONTRAST TECHNIQUE: Multidetector CT imaging of the abdomen and pelvis was performed using the standard protocol following bolus administration of intravenous contrast. CONTRAST:  50mL OMNIPAQUE IOHEXOL 300 MG/ML  SOLN COMPARISON:  08/14/2015 FINDINGS: Lower chest: No acute abnormality.  Coronary artery calcifications.  Hepatobiliary: No solid liver abnormality is seen. Gallstone near the gallbladder neck. No gallbladder wall thickening, or biliary dilatation. Pancreas: Mild adjacent fat stranding about the pancreatic head (series 2, image 39). No pancreatic ductal dilatation. Spleen: Normal in size without significant abnormality. Adrenals/Urinary Tract: Adrenal glands are unremarkable. Kidneys are normal, without renal calculi, solid lesion, or hydronephrosis. Bladder is unremarkable. Stomach/Bowel: Stomach is within normal limits. Appendix appears normal. There is sigmoid diverticulosis with wall thickening and fat stranding about the mid sigmoid, with a rim enhancing small fluid collection anteriorly measuring 1.9 x 1.3 cm (series 2, image 77). There is additional fat stranding in the central small bowel mesentery. Vascular/Lymphatic: Aortic atherosclerosis. Enlarged celiac axis and gastrohepatic ligament lymph nodes measuring up to 1.8 x 1.3 cm (series 2, image 27). Enlarged bilateral iliac and pelvic sidewall lymph nodes measuring up to 1.9 x 1.5 cm (series 2, image 72). Reproductive: Prostatomegaly. Other: No abdominal wall hernia or abnormality. Trace ascites throughout the abdomen and pelvis. Fat stranding in the central small bowel mesentery and retroperitoneum (series 2, image 43, 58). Musculoskeletal: No acute or significant osseous findings. IMPRESSION: 1. There is sigmoid diverticulosis with wall thickening and fat stranding about the mid sigmoid, with a rim enhancing small fluid collection anteriorly measuring 1.9 cm. Findings are consistent with acute diverticulitis complicated by intramural abscess. No evidence of perforation. 2. There is additional fat stranding in the central small bowel mesentery and retroperitoneum, generally greater than would be expected in the setting of diverticulitis and also appearing to surround the pancreatic head. These findings suggest acute pancreatitis, perhaps true and unrelated to  diverticulitis. 3. Enlarged retroperitoneal, iliac, and pelvic sidewall lymph nodes, of uncertain significance, most likely reactive. 4. Trace ascites throughout the abdomen and pelvis. 5. Prostatomegaly. Cholelithiasis without evidence of acute cholecystitis. 6. Coronary artery disease. Aortic Atherosclerosis (ICD10-I70.0). Electronically Signed   By: Eddie Candle M.D.   On: 03/30/2020 08:28       Subjective: Feels well.  No abdominal pain.  Tolerated p.o. intake  Discharge Exam: Vitals:   04/01/20 1154 04/01/20 1544  BP: (!) 150/78 (!) 145/65  Pulse: (!) 50 63  Resp: 18 18  Temp:    SpO2: 98% 100%   Vitals:   04/01/20 0418 04/01/20 0740 04/01/20 1154 04/01/20 1544  BP: 128/73 (!) 164/79 (!) 150/78 (!) 145/65  Pulse: (!) 49 66 (!) 50 63  Resp: 18 16 18 18   Temp: (!) 97.5 F (36.4 C) (!) 97.5 F (36.4 C)    TempSrc:      SpO2: 95% 98% 98% 100%  Weight:      Height:        General: Pt is alert, awake, not in acute distress Cardiovascular: RRR, S1/S2 +, no rubs, no gallops Respiratory: CTA bilaterally, no wheezing, no rhonchi Abdominal: Soft, NT, ND, bowel sounds + Extremities: no edema, no cyanosis    The results of significant diagnostics from this hospitalization (including imaging, microbiology, ancillary and laboratory) are listed below for reference.  Microbiology: Recent Results (from the past 240 hour(s))  Resp Panel by RT-PCR (Flu A&B, Covid) Nasopharyngeal Swab     Status: None   Collection Time: 03/30/20  9:49 AM   Specimen: Nasopharyngeal Swab; Nasopharyngeal(NP) swabs in vial transport medium  Result Value Ref Range Status   SARS Coronavirus 2 by RT PCR NEGATIVE NEGATIVE Final    Comment: (NOTE) SARS-CoV-2 target nucleic acids are NOT DETECTED.  The SARS-CoV-2 RNA is generally detectable in upper respiratory specimens during the acute phase of infection. The lowest concentration of SARS-CoV-2 viral copies this assay can detect is 138 copies/mL. A  negative result does not preclude SARS-Cov-2 infection and should not be used as the sole basis for treatment or other patient management decisions. A negative result may occur with  improper specimen collection/handling, submission of specimen other than nasopharyngeal swab, presence of viral mutation(s) within the areas targeted by this assay, and inadequate number of viral copies(<138 copies/mL). A negative result must be combined with clinical observations, patient history, and epidemiological information. The expected result is Negative.  Fact Sheet for Patients:  EntrepreneurPulse.com.au  Fact Sheet for Healthcare Providers:  IncredibleEmployment.be  This test is no t yet approved or cleared by the [place] FDA and  has been authorized for detection and/or diagnosis of SARS-CoV-2 by FDA under an Emergency Use Authorization (EUA). This EUA will remain  in effect (meaning this test can be used) for the duration of the COVID-19 declaration under Section 564(b)(1) of the Act, 21 U.S.C.section 360bbb-3(b)(1), unless the authorization is terminated  or revoked sooner.       Influenza A by PCR NEGATIVE NEGATIVE Final   Influenza B by PCR NEGATIVE NEGATIVE Final    Comment: (NOTE) The Xpert Xpress SARS-CoV-2/FLU/RSV plus assay is intended as an aid in the diagnosis of influenza from Nasopharyngeal swab specimens and should not be used as a sole basis for treatment. Nasal washings and aspirates are unacceptable for Xpert Xpress SARS-CoV-2/FLU/RSV testing.  Fact Sheet for Patients: EntrepreneurPulse.com.au  Fact Sheet for Healthcare Providers: IncredibleEmployment.be  This test is not yet approved or cleared by the [place] FDA and has been authorized for detection and/or diagnosis of SARS-CoV-2 by FDA under an Emergency Use Authorization (EUA). This EUA will remain in effect (meaning this test can  be used) for the duration of the COVID-19 declaration under Section 564(b)(1) of the Act, 21 U.S.C. section 360bbb-3(b)(1), unless the authorization is terminated or revoked.  Performed at [facility], [address] [place], [place] [address]      Labs: BNP (last 3 results) No results for input(s): BNP in the last 8760 hours. Basic Metabolic Panel: Recent Labs  Lab 03/30/20 0308 03/31/20 1037 04/01/20 0705  NA 131* 132* 133*  K 4.3 4.3 4.5  CL 97* 96* 99  CO2 24 25 24   GLUCOSE 190* 255* 159*  BUN 21 23 21   CREATININE 1.41* 1.35* 1.32*  CALCIUM 9.4 9.2 9.0   Liver Function Tests: Recent Labs  Lab 03/30/20 0308  AST 29  ALT 30  ALKPHOS 68  BILITOT 0.9  PROT 8.4*  ALBUMIN 4.3   Recent Labs  Lab 03/30/20 0308  LIPASE 32   No results for input(s): AMMONIA in the last 168 hours. CBC: Recent Labs  Lab 03/30/20 0308 03/31/20 1037 04/01/20 0705  WBC 10.0 6.4 5.9  NEUTROABS 4.9  --   --   HGB 13.8 13.8 13.6  HCT 41.8 41.6 40.6  MCV 91.7 91.6 91.4  PLT  186 156 170   Cardiac Enzymes: No results for input(s): CKTOTAL, CKMB, CKMBINDEX, TROPONINI in the last 168 hours. BNP: Invalid input(s): POCBNP CBG: Recent Labs  Lab 03/31/20 1215 03/31/20 1641 03/31/20 2045 04/01/20 0733 04/01/20 1149  GLUCAP 230* 167* 202* 147* 199*   D-Dimer No results for input(s): DDIMER in the last 72 hours. Hgb A1c Recent Labs    03/30/20 0308  HGBA1C 7.0*   Lipid Profile No results for input(s): CHOL, HDL, LDLCALC, TRIG, CHOLHDL, LDLDIRECT in the last 72 hours. Thyroid function studies No results for input(s): TSH, T4TOTAL, T3FREE, THYROIDAB in the last 72 hours.  Invalid input(s): FREET3 Anemia work up No results for input(s): VITAMINB12, FOLATE, FERRITIN, TIBC, IRON, RETICCTPCT in the last 72 hours. Urinalysis    Component Value Date/Time   COLORURINE YELLOW (A) 03/30/2020 0306   APPEARANCEUR CLEAR (A) 03/30/2020 0306   APPEARANCEUR Clear  03/30/2013 0839   LABSPEC 1.008 03/30/2020 0306   LABSPEC 1.023 03/30/2013 0839   PHURINE 5.0 03/30/2020 0306   GLUCOSEU NEGATIVE 03/30/2020 0306   GLUCOSEU 150 mg/dL 03/30/2013 0839   HGBUR SMALL (A) 03/30/2020 0306   BILIRUBINUR NEGATIVE 03/30/2020 0306   BILIRUBINUR Negative 03/30/2013 0839   KETONESUR NEGATIVE 03/30/2020 0306   PROTEINUR NEGATIVE 03/30/2020 0306   NITRITE NEGATIVE 03/30/2020 0306   LEUKOCYTESUR NEGATIVE 03/30/2020 0306   LEUKOCYTESUR Negative 03/30/2013 0839   Sepsis Labs Invalid input(s): PROCALCITONIN,  WBC,  LACTICIDVEN Microbiology Recent Results (from the past 240 hour(s))  Resp Panel by RT-PCR (Flu A&B, Covid) Nasopharyngeal Swab     Status: None   Collection Time: 03/30/20  9:49 AM   Specimen: Nasopharyngeal Swab; Nasopharyngeal(NP) swabs in vial transport medium  Result Value Ref Range Status   SARS Coronavirus 2 by RT PCR NEGATIVE NEGATIVE Final    Comment: (NOTE) SARS-CoV-2 target nucleic acids are NOT DETECTED.  The SARS-CoV-2 RNA is generally detectable in upper respiratory specimens during the acute phase of infection. The lowest concentration of SARS-CoV-2 viral copies this assay can detect is 138 copies/mL. A negative result does not preclude SARS-Cov-2 infection and should not be used as the sole basis for treatment or other patient management decisions. A negative result may occur with  improper specimen collection/handling, submission of specimen other than nasopharyngeal swab, presence of viral mutation(s) within the areas targeted by this assay, and inadequate number of viral copies(<138 copies/mL). A negative result must be combined with clinical observations, patient history, and epidemiological information. The expected result is Negative.  Fact Sheet for Patients:  EntrepreneurPulse.com.au  Fact Sheet for Healthcare Providers:  IncredibleEmployment.be  This test is no t yet approved or  cleared by the [place] FDA and  has been authorized for detection and/or diagnosis of SARS-CoV-2 by FDA under an Emergency Use Authorization (EUA). This EUA will remain  in effect (meaning this test can be used) for the duration of the COVID-19 declaration under Section 564(b)(1) of the Act, 21 U.S.C.section 360bbb-3(b)(1), unless the authorization is terminated  or revoked sooner.       Influenza A by PCR NEGATIVE NEGATIVE Final   Influenza B by PCR NEGATIVE NEGATIVE Final    Comment: (NOTE) The Xpert Xpress SARS-CoV-2/FLU/RSV plus assay is intended as an aid in the diagnosis of influenza from Nasopharyngeal swab specimens and should not be used as a sole basis for treatment. Nasal washings and aspirates are unacceptable for Xpert Xpress SARS-CoV-2/FLU/RSV testing.  Fact Sheet for Patients: EntrepreneurPulse.com.au  Fact Sheet for Healthcare Providers: IncredibleEmployment.be  This  test is not yet approved or cleared by the [place] and has been authorized for detection and/or diagnosis of SARS-CoV-2 by FDA under an Emergency Use Authorization (EUA). This EUA will remain in effect (meaning this test can be used) for the duration of the COVID-19 declaration under Section 564(b)(1) of the Act, 21 U.S.C. section 360bbb-3(b)(1), unless the authorization is terminated or revoked.  Performed at [facility], [address], [place], [place] [address]      Time coordinating discharge: Over 30 minutes  SIGNED:   Nolberto Hanlon, MD  [facility] 04/01/2020, 4:43 PM Pager   If 7PM-7AM, please contact night-coverage [email or web address] Password [email or web address]

## 2021-03-25 ENCOUNTER — Encounter: Payer: Self-pay | Admitting: Dermatology

## 2021-03-25 ENCOUNTER — Other Ambulatory Visit: Payer: Self-pay

## 2021-03-25 ENCOUNTER — Ambulatory Visit (INDEPENDENT_AMBULATORY_CARE_PROVIDER_SITE_OTHER): Payer: Medicare Other | Admitting: Dermatology

## 2021-03-25 DIAGNOSIS — L814 Other melanin hyperpigmentation: Secondary | ICD-10-CM

## 2021-03-25 DIAGNOSIS — L570 Actinic keratosis: Secondary | ICD-10-CM

## 2021-03-25 DIAGNOSIS — L821 Other seborrheic keratosis: Secondary | ICD-10-CM

## 2021-03-25 DIAGNOSIS — L738 Other specified follicular disorders: Secondary | ICD-10-CM

## 2021-03-25 DIAGNOSIS — L820 Inflamed seborrheic keratosis: Secondary | ICD-10-CM | POA: Diagnosis not present

## 2021-03-25 DIAGNOSIS — Z85828 Personal history of other malignant neoplasm of skin: Secondary | ICD-10-CM

## 2021-03-25 DIAGNOSIS — D692 Other nonthrombocytopenic purpura: Secondary | ICD-10-CM

## 2021-03-25 DIAGNOSIS — L578 Other skin changes due to chronic exposure to nonionizing radiation: Secondary | ICD-10-CM

## 2021-03-25 NOTE — Progress Notes (Signed)
Follow-Up Visit   Subjective  Randy Frazier is a 88 y.o. male who presents for the following: Follow-up.  Patient here to have several spots on the scalp checked, some are sore. He has a history of skin cancer on the scalp 20+ years ago.    The following portions of the chart were reviewed this encounter and updated as appropriate:       Review of Systems:  No other skin or systemic complaints except as noted in HPI or Assessment and Plan.  Objective  Well appearing patient in no apparent distress; mood and affect are within normal limits.  A focused examination was performed including face, scalp, hands. Relevant physical exam findings are noted in the Assessment and Plan.  forehead/scalp x 4, L ear helix x 1 (5) Pink keratotic macules/papules  Scalp R post parietal, L crown (2) Erythematous stuck-on, waxy papule or plaque    Assessment & Plan   Actinic Damage - chronic, secondary to cumulative UV radiation exposure/sun exposure over time - diffuse scaly erythematous macules with underlying dyspigmentation - Recommend daily broad spectrum sunscreen SPF 30+ to sun-exposed areas, reapply every 2 hours as needed.  - Recommend staying in the shade or wearing long sleeves, sun glasses (UVA+UVB protection) and wide brim hats (4-inch brim around the entire circumference of the hat). - Call for new or changing lesions.  Purpura - Chronic; persistent and recurrent.  Treatable, but not curable. - Violaceous macules and patches of the hands - Benign - Related to trauma, age, sun damage and/or use of blood thinners, chronic use of topical and/or oral steroids - Observe - Can use OTC arnica containing moisturizer such as Dermend Bruise Formula if desired - Call for worsening or other concerns  AK (actinic keratosis) (5) forehead/scalp x 4, L ear helix x 1  Consider 5FU Cream on follow-up.  Actinic keratoses are precancerous spots that appear secondary to cumulative UV  radiation exposure/sun exposure over time. They are chronic with expected duration over 1 year. A portion of actinic keratoses will progress to squamous cell carcinoma of the skin. It is not possible to reliably predict which spots will progress to skin cancer and so treatment is recommended to prevent development of skin cancer.  Recommend daily broad spectrum sunscreen SPF 30+ to sun-exposed areas, reapply every 2 hours as needed.  Recommend staying in the shade or wearing long sleeves, sun glasses (UVA+UVB protection) and wide brim hats (4-inch brim around the entire circumference of the hat). Call for new or changing lesions.  Destruction of lesion - forehead/scalp x 4, L ear helix x 1  Destruction method: cryotherapy   Informed consent: discussed and consent obtained   Lesion destroyed using liquid nitrogen: Yes   Region frozen until ice ball extended beyond lesion: Yes   Outcome: patient tolerated procedure well with no complications   Post-procedure details: wound care instructions given   Additional details:  Prior to procedure, discussed risks of blister formation, small wound, skin dyspigmentation, or rare scar following cryotherapy. Recommend Vaseline ointment to treated areas while healing.   Inflamed seborrheic keratosis (2) Scalp R post parietal, L crown  vs Hypertrophic AK - R post parietal. Recheck on f/u.  Destruction of lesion - Scalp R post parietal, L crown  Destruction method: cryotherapy   Informed consent: discussed and consent obtained   Lesion destroyed using liquid nitrogen: Yes   Region frozen until ice ball extended beyond lesion: Yes   Outcome: patient tolerated procedure well with  no complications   Post-procedure details: wound care instructions given   Additional details:  Prior to procedure, discussed risks of blister formation, small wound, skin dyspigmentation, or rare scar following cryotherapy. Recommend Vaseline ointment to treated areas while  healing.   Lentigines - Scattered tan macules - Due to sun exposure - Benign-appering, observe - Recommend daily broad spectrum sunscreen SPF 30+ to sun-exposed areas, reapply every 2 hours as needed. - Call for any changes  Seborrheic Keratoses - Stuck-on, waxy, tan-brown papules and/or plaques  - Benign-appearing - Discussed benign etiology and prognosis. - Observe - Call for any changes  History of Skin Cancer of the scalp  Clear. Observe for recurrence.  Call clinic for new or changing lesions.   Recommend regular skin exams, daily broad-spectrum spf 30+ sunscreen use, and photoprotection.     Sebaceous Hyperplasia vs Cyst - Yellow white instinct firm papule of the left lower nasal dorsum - Benign - Observe - Discussed extraction vs ED if becomes bothersome   Return in about 2 months (around 05/23/2021) for  Recheck scalp, Hx AKs (consider 5FU Cream), ISKs.  IJamesetta Orleans, CMA, am acting as scribe for Brendolyn Patty, MD . Documentation: I have reviewed the above documentation for accuracy and completeness, and I agree with the above.  Brendolyn Patty MD

## 2021-03-25 NOTE — Patient Instructions (Addendum)

## 2021-05-14 ENCOUNTER — Ambulatory Visit (INDEPENDENT_AMBULATORY_CARE_PROVIDER_SITE_OTHER): Payer: Medicare Other | Admitting: Dermatology

## 2021-05-14 DIAGNOSIS — L720 Epidermal cyst: Secondary | ICD-10-CM | POA: Diagnosis not present

## 2021-05-14 DIAGNOSIS — Z85828 Personal history of other malignant neoplasm of skin: Secondary | ICD-10-CM

## 2021-05-14 DIAGNOSIS — L578 Other skin changes due to chronic exposure to nonionizing radiation: Secondary | ICD-10-CM

## 2021-05-14 DIAGNOSIS — L570 Actinic keratosis: Secondary | ICD-10-CM

## 2021-05-14 MED ORDER — CALCIPOTRIENE 0.005 % EX CREA: TOPICAL_CREAM | CUTANEOUS | 0 refills | Status: DC

## 2021-05-14 MED ORDER — FLUOROURACIL 5 % EX CREA: TOPICAL_CREAM | CUTANEOUS | 0 refills | Status: DC

## 2021-05-14 NOTE — Patient Instructions (Addendum)
5-fluorouracil/calcipotriene cream is is a type of field treatment used to treat precancers, thin skin cancers, and areas of sun damage. Expected reaction includes irritation and mild inflammation potentially progressing to more severe inflammation including redness, scaling, crusting and open sores/erosions.  If too much irritation occurs, ensure application of only a thin layer and decrease frequency of use to achieve a tolerable level of inflammation. Recommend applying Vaseline ointment to open sores as needed.  Minimize sun exposure while under treatment. Recommend daily broad spectrum sunscreen SPF 30+ to sun-exposed areas, reapply every 2 hours as needed.  ? ?5-Fluorouracil/Calcipotriene Patient Education  ? ?Actinic keratoses are the dry, red scaly spots on the skin caused by sun damage. A portion of these spots can turn into skin cancer with time, and treating them can help prevent development of skin cancer.  ? ?Treatment of these spots requires removal of the defective skin cells. There are various ways to remove actinic keratoses, including freezing with liquid nitrogen, treatment with creams, or treatment with a blue light procedure in the office.  ? ?5-fluorouracil cream is a topical cream used to treat actinic keratoses. It works by interfering with the growth of abnormal fast-growing skin cells, such as actinic keratoses. These cells peel off and are replaced by healthy ones.  ? ?5-fluorouracil/calcipotriene is a combination of the 5-fluorouracil cream with a vitamin D analog cream called calcipotriene. The calcipotriene alone does not treat actinic keratoses. However, when it is combined with 5-fluorouracil, it helps the 5-fluorouracil treat the actinic keratoses much faster so that the same results can be achieved with a much shorter treatment time. ? ?INSTRUCTIONS FOR 5-FLUOROURACIL/CALCIPOTRIENE CREAM:  ? ?5-fluorouracil/calcipotriene cream typically only needs to be used for 10-14 days. A thin  layer should be applied twice a day to the treatment areas recommended by your physician.  ? ?If your physician prescribed you separate tubes of 5-fluourouracil and calcipotriene, apply a thin layer of 5-fluorouracil followed by a thin layer of calcipotriene.  ? ?Avoid contact with your eyes, nostrils, and mouth. Do not use 5-fluorouracil/calcipotriene cream on infected or open wounds.  ? ?You will develop redness, irritation and some crusting at areas where you have pre-cancer damage/actinic keratoses. IF YOU DEVELOP PAIN, BLEEDING, OR SIGNIFICANT CRUSTING, STOP THE TREATMENT EARLY - you have already gotten a good response and the actinic keratoses should clear up well. ? ?Wash your hands after applying 5-fluorouracil 5% cream on your skin.  ? ?A moisturizer or sunscreen with a minimum SPF 30 should be applied each morning.  ? ?Once you have finished the treatment, you can apply a thin layer of Vaseline twice a day to irritated areas to soothe and calm the areas more quickly. If you experience significant discomfort, contact your physician. ? ?For some patients it is necessary to repeat the treatment for best results. ? ?SIDE EFFECTS: When using 5-fluorouracil/calcipotriene cream, you may have mild irritation, such as redness, dryness, swelling, or a mild burning sensation. This usually resolves within 2 weeks. The more actinic keratoses you have, the more redness and inflammation you can expect during treatment. Eye irritation has been reported rarely. If this occurs, please let us know.  ? ?If you have any trouble using this cream, please call the office. If you have any other questions about this information, please do not hesitate to ask me before you leave the office. ? ? ? ? ?If You Need Anything After Your Visit ? ?If you have any questions or concerns for your doctor, please  call our main line at [phone] and press option 4 to reach your doctor's medical assistant. If no one answers, please leave a  voicemail as directed and we will return your call as soon as possible. Messages left after 4 pm will be answered the following business day.  ? ?You may also send [place] a message via MyChart. We typically respond to MyChart messages within 1-2 business days. ? ?For prescription refills, please ask your pharmacy to contact our office. Our fax number is [phone]. ? ?If you have an urgent issue when the clinic is closed that cannot wait until the next business day, you can page your doctor at the number below.   ? ?Please note that while we do our best to be available for urgent issues outside of office hours, we are not available 24/7.  ? ?If you have an urgent issue and are unable to reach [place], you may choose to seek medical care at your doctor's office, retail clinic, urgent care center, or emergency room. ? ?If you have a medical emergency, please immediately call 911 or go to the emergency department. ? ?Pager Numbers ? ?- Dr. Nehemiah Massed: [phone] ? ?- Dr. Laurence Ferrari: [phone] ? ?- Dr. Nicole Kindred: [phone] ? ?In the event of inclement weather, please call our main line at [phone] for an update on the status of any delays or closures. ? ?Dermatology Medication Tips: ?Please keep the boxes that topical medications come in in order to help keep track of the instructions about where and how to use these. Pharmacies typically print the medication instructions only on the boxes and not directly on the medication tubes.  ? ?If your medication is too expensive, please contact our office at [phone] option 4 or send [place] a message through [place].  ? ?We are unable to tell what your co-pay for medications will be in advance as this is different depending on your insurance coverage. However, we may be able to find a substitute medication at lower cost or fill out paperwork to get insurance to cover a needed medication.  ? ?If a prior authorization is required to get your medication covered by your insurance  company, please allow [place] 1-2 business days to complete this process. ? ?Drug prices often vary depending on where the prescription is filled and some pharmacies may offer cheaper prices. ? ?The website www.goodrx.com contains coupons for medications through different pharmacies. The prices here do not account for what the cost may be with help from insurance (it may be cheaper with your insurance), but the website can give you the price if you did not use any insurance.  ?- You can print the associated coupon and take it with your prescription to the pharmacy.  ?- You may also stop by our office during regular business hours and pick up a GoodRx coupon card.  ?- If you need your prescription sent electronically to a different pharmacy, notify our office through [facility] or by phone at [phone] option 4. ? ? ? ? ?Si Usted Necesita Algo Despu?s de Su Visita ? ?Tambi?n puede enviarnos un mensaje a trav?s de MyChart. Por lo general respondemos a los mensajes de MyChart en el transcurso de 1 a 2 d?as h?biles. ? ?Para renovar recetas, por favor pida a su farmacia que se ponga en contacto con nuestra oficina. Nuestro n?mero de fax es el [phone]. ? ?Si tiene un asunto urgente cuando la cl?nica est? cerrada y que no puede esperar hasta el siguiente d?a h?bil,  puede llamar/localizar a su doctor(a) al n?mero que aparece a continuaci?n.  ? ?Por favor, tenga en cuenta que aunque hacemos todo lo posible para estar disponibles para asuntos urgentes fuera del horario de oficina, no estamos disponibles las 24 horas del d?a, los 7 d?as de la semana.  ? ?Si tiene un problema urgente y no puede comunicarse con nosotros, puede optar por buscar atenci?n m?dica  en el consultorio de su doctor(a), en una cl?nica privada, en un centro de atenci?n urgente o en una sala de emergencias. ? ?Si tiene Engineer, maintenance (IT) m?dica, por favor llame inmediatamente al 911 o vaya a la sala de emergencias. ? ?N?meros de b?per ? ?- Dr.  Nehemiah Massed: [phone] ? ?- Dra. Moye: [phone] ? ?- Dra. Nicole Kindred: [phone] ? ?En caso de inclemencias del tiempo, por favor llame a nuestra l?nea principal al [phone] para una actualizaci?n sobre el est

## 2021-05-14 NOTE — Progress Notes (Signed)
? ?Follow-Up Visit ?  ?Subjective  ?Randy Frazier is a 88 y.o. male who presents for the following: Follow-up. ? ?Patient here for 2 month follow-up Aks, recheck the right post parietal and left crown. He has a sore spot on his left temple. 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 He has new bump by eye that is growing and is irritated. ? ?The following portions of the chart were reviewed this encounter and updated as appropriate:  ?  ?  ? ?Review of Systems:  No other skin or systemic complaints except as noted in HPI or Assessment and Plan. ? ?Objective  ?Well appearing patient in no apparent distress; mood and affect are within normal limits. ? ?A focused examination was performed including face, scalp. Relevant physical exam findings are noted in the Assessment and Plan. ? ?L upper temple x 1, scalp x 5 (6) ?Erythematous thin papules/macules with gritty scale.  ? ?L medial canthus ?Subcutaneous nodule with punctum ? ? ? ?Assessment & Plan  ?Actinic Damage - Severe, confluent actinic changes with pre-cancerous actinic keratoses  ?- Severe, chronic, not at goal, secondary to cumulative UV radiation exposure over time ?- diffuse scaly erythematous macules and papules with underlying dyspigmentation ?- Discussed Prescription "Field Treatment" for Severe, Chronic Confluent Actinic Changes with Pre-Cancerous Actinic Keratoses ?Field treatment involves treatment of an entire area of skin that has confluent Actinic Changes (Sun/ Ultraviolet light damage) and PreCancerous Actinic Keratoses by method of PhotoDynamic Therapy (PDT) and/or prescription Topical Chemotherapy agents such as 5-fluorouracil, 5-fluorouracil/calcipotriene, and/or imiquimod.  The purpose is to decrease the number of clinically evident and subclinical PreCancerous lesions to prevent progression to development of skin cancer by chemically destroying early precancer changes that may or may not be visible.  It has been shown to reduce the risk of  developing skin cancer in the treated area. As a result of treatment, redness, scaling, crusting, and open sores may occur during treatment course. One or more than one of these methods may be used and may have to be used several times to control, suppress and eliminate the PreCancerous changes. Discussed treatment course, expected reaction, and possible side effects. ?- Recommend daily broad spectrum sunscreen SPF 30+ to sun-exposed areas, reapply every 2 hours as needed.  ?- Staying in the shade or wearing long sleeves, sun glasses (UVA+UVB protection) and wide brim hats (4-inch brim around the entire circumference of the hat) are also recommended. ?- Call for new or changing lesions. ?- start 5-fluorouracil/calcipotriene cream twice a day for 10-14 days to affected areas including scalp ? ?AK (actinic keratosis) (6) ?L upper temple x 1, scalp x 5 ? ?Actinic keratoses are precancerous spots that appear secondary to cumulative UV radiation exposure/sun exposure over time. They are chronic with expected duration over 1 year. A portion of actinic keratoses will progress to squamous cell carcinoma of the skin. It is not possible to reliably predict which spots will progress to skin cancer and so treatment is recommended to prevent development of skin cancer. ? ?Recommend daily broad spectrum sunscreen SPF 30+ to sun-exposed areas, reapply every 2 hours as needed.  ?Recommend staying in the shade or wearing long sleeves, sun glasses (UVA+UVB protection) and wide brim hats (4-inch brim around the entire circumference of the hat). ?Call for new or changing lesions. ? ?Once areas healed, start 5-fluorouracil/calcipotriene cream twice a day for 10-14 days to affected areas including scalp.  ? ?5-fluorouracil/calcipotriene cream is is a type of field treatment used  to treat precancers, thin skin cancers, and areas of sun damage. Expected reaction includes irritation and mild inflammation potentially progressing to more  severe inflammation including redness, scaling, crusting and open sores/erosions.  If too much irritation occurs, ensure application of only a thin layer and decrease frequency of use to achieve a tolerable level of inflammation. Recommend applying Vaseline ointment to open sores as needed.  Minimize sun exposure while under treatment. Recommend daily broad spectrum sunscreen SPF 30+ to sun-exposed areas, reapply every 2 hours as needed.  ? ?   ? ?Destruction of lesion - L upper temple x 1, scalp x 5 ? ?Destruction method: cryotherapy   ?Informed consent: discussed and consent obtained   ?Lesion destroyed using liquid nitrogen: Yes   ?Region frozen until ice ball extended beyond lesion: Yes   ?Outcome: patient tolerated procedure well with no complications   ?Post-procedure details: wound care instructions given   ?Additional details:  Prior to procedure, discussed risks of blister formation, small wound, skin dyspigmentation, or rare scar following cryotherapy. Recommend Vaseline ointment to treated areas while healing. ? ? ?fluorouracil (EFUDEX) 5 % cream - L upper temple x 1, scalp x 5 ?Apply to scalp twice a day x 10-14 days. ? ?calcipotriene (DOVONOX) 0.005 % cream - L upper temple x 1, scalp x 5 ?Apply to scalp twice a day x 10-14 days. ? ?Epidermal cyst ?L medial canthus ? ?Destruction of lesion - L medial canthus ? ?Destruction method: cryotherapy   ?Destruction method comment:  Extraction, not cryotherapy ?Informed consent: discussed and consent obtained   ?Anesthesia: the lesion was anesthetized in a standard fashion   ?Anesthetic:  1% lidocaine w/ epinephrine 1-100,000 local infiltration ?Hemostasis achieved with:  pressure ?Outcome: patient tolerated procedure well with no complications   ?Post-procedure details: wound care instructions given   ?Additional details:  Procedure risks and benefits were discussed with the patient and verbal consent was obtained. Following prep of the skin on the left medial  canthus with an alcohol swab and local anesthesia, lesion was incised with 11 blade and extraction of cyst was performed with cotton swabs. The patient tolerated the procedure well. ? ? ? ?Return in about 3 months (around 08/13/2021) for AKs. ? ?I, Jamesetta Orleans, CMA, am acting as scribe for Brendolyn Patty, MD . ?Documentation: I have reviewed the above documentation for accuracy and completeness, and I agree with the above. ? ?Brendolyn Patty MD  ? ? ?

## 2021-05-20 ENCOUNTER — Telehealth: Payer: Self-pay

## 2021-05-20 NOTE — Telephone Encounter (Signed)
Patient was given written prescriptions for Fluorouracil 5% and Calcipotriene creams. He took them to [facility] which is an non [facility] and they do not carry these and will not be able to get them. ?

## 2021-05-23 ENCOUNTER — Other Ambulatory Visit: Payer: Self-pay

## 2021-05-23 DIAGNOSIS — L570 Actinic keratosis: Secondary | ICD-10-CM

## 2021-05-23 MED ORDER — FLUOROURACIL 5 % EX CREA: TOPICAL_CREAM | CUTANEOUS | 0 refills | Status: DC

## 2021-05-23 NOTE — Progress Notes (Signed)
5FU cream mixed with Calcipotriene sent in to [facility].  ? ?See previous telephone call note.  ?

## 2021-05-23 NOTE — Telephone Encounter (Signed)
Spoke with patient and he agreed to prescription at first.  ? ?Patient then called back and stated he was 88 years old with a strict income. He can not afford to pay $40 for this prescription. He will have to do without at this time. ? ?He does appreciate all of your time and work with him. aw ?

## 2021-07-16 ENCOUNTER — Encounter: Payer: Self-pay | Admitting: Urology

## 2021-07-16 ENCOUNTER — Other Ambulatory Visit
Admission: RE | Admit: 2021-07-16 | Discharge: 2021-07-16 | Disposition: A | Discharge status: Home / Self Care | Payer: Medicare Other | Attending: Urology | Admitting: Urology

## 2021-07-16 ENCOUNTER — Ambulatory Visit (INDEPENDENT_AMBULATORY_CARE_PROVIDER_SITE_OTHER): Payer: Medicare Other | Admitting: Urology

## 2021-07-16 ENCOUNTER — Other Ambulatory Visit: Payer: Self-pay | Admitting: *Deleted

## 2021-07-16 VITALS — BP 179/80 | HR 73 | Ht 70.0 in | Wt 180.0 lb

## 2021-07-16 DIAGNOSIS — N3941 Urge incontinence: Secondary | ICD-10-CM | POA: Diagnosis not present

## 2021-07-16 DIAGNOSIS — R3915 Urgency of urination: Secondary | ICD-10-CM

## 2021-07-16 DIAGNOSIS — R32 Unspecified urinary incontinence: Secondary | ICD-10-CM

## 2021-07-16 DIAGNOSIS — N138 Other obstructive and reflux uropathy: Secondary | ICD-10-CM

## 2021-07-16 DIAGNOSIS — R3121 Asymptomatic microscopic hematuria: Secondary | ICD-10-CM | POA: Diagnosis not present

## 2021-07-16 DIAGNOSIS — N401 Enlarged prostate with lower urinary tract symptoms: Secondary | ICD-10-CM

## 2021-07-16 LAB — URINALYSIS, COMPLETE (UACMP) WITH MICROSCOPIC
Bilirubin Urine: NEGATIVE
Glucose, UA: 100 mg/dL — AB
Ketones, ur: NEGATIVE mg/dL
Leukocytes,Ua: NEGATIVE
Nitrite: NEGATIVE
Protein, ur: 30 mg/dL — AB
Specific Gravity, Urine: 1.02 (ref 1.005–1.030)
pH: 5.5 (ref 5.0–8.0)

## 2021-07-16 NOTE — Patient Instructions (Signed)

## 2021-07-16 NOTE — Progress Notes (Signed)
   07/16/21 8:55 AM   Randy Frazier 1927/03/02 [id number]  CC: BPH, history of stricture disease, urgency and urge incontinence  HPI: 88 year old relatively healthy male with history of cardiac disease who remains on anticoagulation who reports at least a few months of worsening urgency and some urge incontinence.  He has a good urinary stream, but feels like it is split sometimes.  He reportedly has a long history of stricture disease requiring dilation by Dr. Ernst Spell at least 10 times, and previously underwent a cryoablation for BPH.  He denies any gross hematuria, dysuria, or UTIs.  Urinalysis today with microscopic hematuria with 6-10 RBCs, PVR normal at 70 mL.  CT from February 2022 shows no hydronephrosis, nondistended bladder, no bladder lesions, 68 g prostate.  No prior PSA values to review.  PMH: Past Medical History:  Diagnosis Date   Actinic keratosis    Arrhythmia    A-Fib   BPH (benign prostatic hyperplasia)    Coronary artery disease 02/11/2007   CABG X 3  @ [facility]   CVA (cerebral infarction) 02/11/2012   Diabetes mellitus without complication (HCC)    Enlarged prostate    GERD (gastroesophageal reflux disease)    Hyperlipidemia    Hypertension    Renal disorder    Skin cancer    scalp, tx by Dr Sharlett Iles 2000s?    Surgical History: Past Surgical History:  Procedure Laterality Date   cataract surgery Bilateral    CORONARY ARTERY BYPASS GRAFT  2009   CABG X 3 @ [facility]   TEAR DUCT PROBING Right 05/07/2016   Procedure: Evacuation of hematoma, right peri-orbital bleed.;  Surgeon: Clista Bernhardt, MD;  Location: [facility];  Service: Ophthalmology;  Laterality: Right;    Family History: Family History  Family history unknown: Yes    Social History:  reports that he has never smoked. He has never used smokeless tobacco. He reports that he does not drink alcohol and does not use drugs.  Physical Exam: BP (!) 179/80 (BP Location: Left Arm, Patient Position:  Sitting, Cuff Size: Normal)   Pulse 73   Ht '5\' 10"'$  (1.778 m)   Wt 180 lb (81.6 kg)   BMI 25.83 kg/m    Constitutional:  Alert and oriented, No acute distress. Cardiovascular: No clubbing, cyanosis, or edema. Respiratory: Normal respiratory effort, no increased work of breathing. GI: Abdomen is soft, nontender, nondistended, no abdominal masses GU: Circumcised phallus with patent meatus  Laboratory Data: Reviewed, see HPI  Pertinent Imaging: I have personally viewed and interpreted the CT abdomen and pelvis from February 2022 showing a 68g prostate, no hydronephrosis, nondistended bladder  Assessment & Plan:   88 year old male with reported history of urethral stricture disease requiring multiple dilations in the past, history of cryoablation for BPH, who reports worsening urgency and urge incontinence.  Microscopic hematuria present today with 6-10 RBCs.  We reviewed the AUA guidelines regarding evaluation, and with his normal CT last year he would like to pursue cystoscopy alone, which will also help elucidate etiology of his urinary symptoms.  We discussed possible etiologies including recurrence of stricture disease, BPH, overactive bladder, or malignancy.  Follow-up for cystoscopy, if cystoscopy benign consider OAB medication like Myrbetriq  Nickolas Madrid, MD 07/16/2021  [facility] [address], [place], [place] [address] [phone]

## 2021-07-24 ENCOUNTER — Other Ambulatory Visit: Payer: Medicare Other | Admitting: Urology

## 2021-07-25 ENCOUNTER — Encounter: Payer: Self-pay | Admitting: Urology

## 2021-07-25 ENCOUNTER — Ambulatory Visit (INDEPENDENT_AMBULATORY_CARE_PROVIDER_SITE_OTHER): Payer: Medicare Other | Admitting: Urology

## 2021-07-25 VITALS — BP 176/56 | HR 76 | Ht 70.0 in | Wt 180.0 lb

## 2021-07-25 DIAGNOSIS — N138 Other obstructive and reflux uropathy: Secondary | ICD-10-CM

## 2021-07-25 DIAGNOSIS — R32 Unspecified urinary incontinence: Secondary | ICD-10-CM

## 2021-07-25 DIAGNOSIS — N3281 Overactive bladder: Secondary | ICD-10-CM

## 2021-07-25 DIAGNOSIS — Z87440 Personal history of urinary (tract) infections: Secondary | ICD-10-CM | POA: Diagnosis not present

## 2021-07-25 DIAGNOSIS — N401 Enlarged prostate with lower urinary tract symptoms: Secondary | ICD-10-CM

## 2021-07-25 DIAGNOSIS — R3121 Asymptomatic microscopic hematuria: Secondary | ICD-10-CM

## 2021-07-25 MED ORDER — SULFAMETHOXAZOLE-TRIMETHOPRIM 800-160 MG PO TABS
1.0000 | ORAL_TABLET | Freq: Once | ORAL | Status: AC
  Administered 2021-07-25: 1 via ORAL

## 2021-07-25 MED ORDER — MIRABEGRON ER 25 MG PO TB24: 25.0000 mg | ORAL_TABLET | Freq: Every day | ORAL | 0 refills | Status: DC

## 2021-07-25 NOTE — Patient Instructions (Signed)
Your cystoscopy showed a slightly enlarged prostate, but no worrisome bladder lesions, no evidence of stricture or scar tissue in the urethra.  I recommend trying Myrbetriq first for your overactive bladder symptoms of urgency and urge incontinence.  This is a once a day medication with very low risk of side effects, and within the first 1 to 2 weeks you should notice an improvement in the urgency and frequency of urination.  We will see you back in follow-up in 1 month to check on your urinary symptoms, and see how you are emptying your bladder.  It will be normal to have blood in the urine over the next few days.  Drink plenty of fluids to keep your bladder flushed.  If you have fever over 101, or unable to urinate please call the clinic or present to the ER.  Overactive Bladder, Adult  Overactive bladder is a condition in which a person has a sudden and frequent need to urinate. A person might also leak urine if he or she cannot get to the bathroom fast enough (urinary incontinence). Sometimes, symptoms can interfere with work or social activities. What are the causes? Overactive bladder is associated with poor nerve signals between your bladder and your brain. Your bladder may get the signal to empty before it is full. You may also have very sensitive muscles that make your bladder squeeze too soon. This condition may also be caused by other factors, such as: Medical conditions: Urinary tract infection. Infection of nearby tissues. Prostate enlargement. Bladder stones, inflammation, or tumors. Diabetes. Muscle or nerve weakness, especially from these conditions: A spinal cord injury. Stroke. Multiple sclerosis. Parkinson's disease. Other causes: Surgery on the uterus or urethra. Drinking too much caffeine or alcohol. Certain medicines, especially those that eliminate extra fluid in the body (diuretics). Constipation. What increases the risk? You may be at greater risk for overactive  bladder if you: Are an older adult. Smoke. Are going through menopause. Have prostate problems. Have a neurological disease, such as stroke, dementia, Parkinson's disease, or multiple sclerosis (MS). Eat or drink alcohol, spicy food, caffeine, and other things that irritate the bladder. Are overweight or obese. What are the signs or symptoms? Symptoms of this condition include a sudden, strong urge to urinate. Other symptoms include: Leaking urine. Urinating 8 or more times a day. Waking up to urinate 2 or more times overnight. How is this diagnosed? This condition may be diagnosed based on: Your symptoms and medical history. A physical exam. Blood or urine tests to check for possible causes, such as infection. You may also need to see a health care provider who specializes in urinary tract problems. This is called a urologist. How is this treated? Treatment for overactive bladder depends on the cause of your condition and whether it is mild or severe. Treatment may include: Bladder training, such as: Learning to control the urge to urinate by following a schedule to urinate at regular intervals. Doing Kegel exercises to strengthen the pelvic floor muscles that support your bladder. Special devices, such as: Biofeedback. This uses sensors to help you become aware of your body's signals. Electrical stimulation. This uses electrodes placed inside the body (implanted) or outside the body. These electrodes send gentle pulses of electricity to strengthen the nerves or muscles that control the bladder. Women may use a plastic device, called a pessary, that fits into the vagina and supports the bladder. Medicines, such as: Antibiotics to treat bladder infection. Antispasmodics to stop the bladder from releasing urine  at the wrong time. Tricyclic antidepressants to relax bladder muscles. Injections of botulinum toxin type A directly into the bladder tissue to relax bladder muscles. Surgery,  such as: A device may be implanted to help manage the nerve signals that control urination. An electrode may be implanted to stimulate electrical signals in the bladder. A procedure may be done to change the shape of the bladder. This is done only in very severe cases. Follow these instructions at home: Eating and drinking  Make diet or lifestyle changes recommended by your health care provider. These may include: Drinking fluids throughout the day and not only with meals. Cutting down on caffeine or alcohol. Eating a healthy and balanced diet to prevent constipation. This may include: Choosing foods that are high in fiber, such as beans, whole grains, and fresh fruits and vegetables. Limiting foods that are high in fat and processed sugars, such as fried and sweet foods. Lifestyle  Lose weight if needed. Do not use any products that contain nicotine or tobacco. These include cigarettes, chewing tobacco, and vaping devices, such as e-cigarettes. If you need help quitting, ask your health care provider. General instructions Take over-the-counter and prescription medicines only as told by your health care provider. If you were prescribed an antibiotic medicine, take it as told by your health care provider. Do not stop taking the antibiotic even if you start to feel better. Use any implants or pessary as told by your health care provider. If needed, wear pads to absorb urine leakage. Keep a log to track how much and when you drink, and when you need to urinate. This will help your health care provider monitor your condition. Keep all follow-up visits. This is important. Contact a health care provider if: You have a fever or chills. Your symptoms do not get better with treatment. Your pain and discomfort get worse. You have more frequent urges to urinate. Get help right away if: You are not able to control your bladder. Summary Overactive bladder refers to a condition in which a person  has a sudden and frequent need to urinate. Several conditions may lead to an overactive bladder. Treatment for overactive bladder depends on the cause and severity of your condition. Making lifestyle changes, doing Kegel exercises, keeping a log, and taking medicines can help with this condition. This information is not intended to replace advice given to you by your health care provider. Make sure you discuss any questions you have with your health care provider. Document Revised: 10/17/2019 Document Reviewed: 10/17/2019 Elsevier Patient Education  [employer].

## 2021-07-25 NOTE — Progress Notes (Signed)
Cystoscopy Procedure Note:  Indication: History of urethral strictures, urgency and urge incontinence, microscopic hematuria  Bactrim given for prophylaxis  After informed consent and discussion of the procedure and its risks, Randy Frazier was positioned and prepped in the standard fashion. Cystoscopy was performed with a flexible cystoscope. The urethra, bladder neck and entire bladder was visualized in a standard fashion. The prostate was large with some intravesical protrusion and a high bladder neck the ureteral orifices were visualized in their normal location and orientation.  Bladder mucosa grossly normal throughout, no abnormalities on retroflexion.  Findings: Normal cystoscopy, no stricture disease, enlarged prostate  ----------------------------------------------------------------  Assessment and Plan: We discussed options for his worsening urinary symptoms and the overlap between BPH and OAB.  With his age and primarily overactive symptoms I recommended starting with a trial of Myrbetriq 25 mg daily, and samples were given.  Risk and benefits discussed.  Regarding his low-grade microscopic hematuria, reassurance provided regarding normal cystoscopy today, as well as normal CT in February 2022.  Using shared decision making he opted to defer further imaging at this time with his age.  Trial of Myrbetriq RTC 4 weeks PVR and symptom check  Nickolas Madrid, MD 07/25/2021

## 2021-07-26 ENCOUNTER — Encounter: Payer: Self-pay | Admitting: Emergency Medicine

## 2021-07-26 ENCOUNTER — Emergency Department
Admission: EM | Admit: 2021-07-26 | Discharge: 2021-07-26 | Disposition: A | Discharge status: Home / Self Care | Payer: Medicare Other | Source: Home / Self Care | Attending: Emergency Medicine | Admitting: Emergency Medicine

## 2021-07-26 ENCOUNTER — Emergency Department
Admission: EM | Admit: 2021-07-26 | Discharge: 2021-07-26 | Disposition: A | Discharge status: Home / Self Care | Payer: Medicare Other | Attending: Emergency Medicine | Admitting: Emergency Medicine

## 2021-07-26 ENCOUNTER — Telehealth: Payer: Self-pay

## 2021-07-26 ENCOUNTER — Other Ambulatory Visit: Payer: Self-pay

## 2021-07-26 ENCOUNTER — Encounter: Payer: Self-pay | Admitting: Intensive Care

## 2021-07-26 DIAGNOSIS — Z794 Long term (current) use of insulin: Secondary | ICD-10-CM | POA: Insufficient documentation

## 2021-07-26 DIAGNOSIS — M10031 Idiopathic gout, right wrist: Secondary | ICD-10-CM | POA: Insufficient documentation

## 2021-07-26 DIAGNOSIS — I2510 Atherosclerotic heart disease of native coronary artery without angina pectoris: Secondary | ICD-10-CM | POA: Insufficient documentation

## 2021-07-26 DIAGNOSIS — I10 Essential (primary) hypertension: Secondary | ICD-10-CM | POA: Insufficient documentation

## 2021-07-26 DIAGNOSIS — E119 Type 2 diabetes mellitus without complications: Secondary | ICD-10-CM | POA: Insufficient documentation

## 2021-07-26 DIAGNOSIS — T83091A Other mechanical complication of indwelling urethral catheter, initial encounter: Secondary | ICD-10-CM | POA: Insufficient documentation

## 2021-07-26 DIAGNOSIS — Z951 Presence of aortocoronary bypass graft: Secondary | ICD-10-CM | POA: Insufficient documentation

## 2021-07-26 DIAGNOSIS — R339 Retention of urine, unspecified: Secondary | ICD-10-CM | POA: Diagnosis present

## 2021-07-26 DIAGNOSIS — R310 Gross hematuria: Secondary | ICD-10-CM

## 2021-07-26 DIAGNOSIS — E1165 Type 2 diabetes mellitus with hyperglycemia: Secondary | ICD-10-CM | POA: Insufficient documentation

## 2021-07-26 DIAGNOSIS — N368 Other specified disorders of urethra: Secondary | ICD-10-CM

## 2021-07-26 DIAGNOSIS — M109 Gout, unspecified: Secondary | ICD-10-CM

## 2021-07-26 DIAGNOSIS — N35811 Other urethral stricture, male, meatal: Secondary | ICD-10-CM | POA: Insufficient documentation

## 2021-07-26 DIAGNOSIS — R338 Other retention of urine: Secondary | ICD-10-CM

## 2021-07-26 DIAGNOSIS — Z85828 Personal history of other malignant neoplasm of skin: Secondary | ICD-10-CM | POA: Diagnosis not present

## 2021-07-26 DIAGNOSIS — T839XXA Unspecified complication of genitourinary prosthetic device, implant and graft, initial encounter: Secondary | ICD-10-CM

## 2021-07-26 DIAGNOSIS — R739 Hyperglycemia, unspecified: Secondary | ICD-10-CM

## 2021-07-26 LAB — CBC WITH DIFFERENTIAL/PLATELET
Abs Immature Granulocytes: 0.03 10*3/uL (ref 0.00–0.07)
Basophils Absolute: 0.1 10*3/uL (ref 0.0–0.1)
Basophils Relative: 1 %
Eosinophils Absolute: 0.3 10*3/uL (ref 0.0–0.5)
Eosinophils Relative: 2 %
HCT: 41 % (ref 39.0–52.0)
Hemoglobin: 13.2 g/dL (ref 13.0–17.0)
Immature Granulocytes: 0 %
Lymphocytes Relative: 45 %
Lymphs Abs: 5.4 10*3/uL — ABNORMAL HIGH (ref 0.7–4.0)
MCH: 28.6 pg (ref 26.0–34.0)
MCHC: 32.2 g/dL (ref 30.0–36.0)
MCV: 88.7 fL (ref 80.0–100.0)
Monocytes Absolute: 2.8 10*3/uL — ABNORMAL HIGH (ref 0.1–1.0)
Monocytes Relative: 23 %
Neutro Abs: 3.5 10*3/uL (ref 1.7–7.7)
Neutrophils Relative %: 29 %
Platelets: 182 10*3/uL (ref 150–400)
RBC: 4.62 MIL/uL (ref 4.22–5.81)
RDW: 18 % — ABNORMAL HIGH (ref 11.5–15.5)
Smear Review: NORMAL
WBC: 12 10*3/uL — ABNORMAL HIGH (ref 4.0–10.5)
nRBC: 0 % (ref 0.0–0.2)

## 2021-07-26 LAB — BASIC METABOLIC PANEL
Anion gap: 9 (ref 5–15)
BUN: 18 mg/dL (ref 8–23)
CO2: 23 mmol/L (ref 22–32)
Calcium: 9.1 mg/dL (ref 8.9–10.3)
Chloride: 103 mmol/L (ref 98–111)
Creatinine, Ser: 1.13 mg/dL (ref 0.61–1.24)
GFR, Estimated: >60 mL/min (ref >60)
Glucose, Bld: 307 mg/dL — ABNORMAL HIGH (ref 70–99)
Potassium: 4 mmol/L (ref 3.5–5.1)
Sodium: 135 mmol/L (ref 135–145)

## 2021-07-26 LAB — URINALYSIS, ROUTINE W REFLEX MICROSCOPIC
Bacteria, UA: NONE SEEN
RBC / HPF: >50 RBC/hpf — ABNORMAL HIGH (ref 0–5)
Specific Gravity, Urine: 1.025 (ref 1.005–1.030)
Squamous Epithelial / HPF: NONE SEEN (ref 0–5)

## 2021-07-26 LAB — CBG MONITORING, ED: Glucose-Capillary: 289 mg/dL — ABNORMAL HIGH (ref 70–99)

## 2021-07-26 MED ORDER — LIDOCAINE HCL URETHRAL/MUCOSAL 2 % EX GEL
1.0000 | Freq: Once | CUTANEOUS | Status: AC
  Administered 2021-07-26: 1 via URETHRAL
  Filled 2021-07-26: qty 10

## 2021-07-26 MED ORDER — LIDOCAINE HCL 2 % EX GEL
1.0000 | CUTANEOUS | 0 refills | Status: DC | PRN
Start: 2021-07-26 — End: 2021-12-09

## 2021-07-26 MED ORDER — COLCHICINE 0.6 MG PO TABS: 0.6000 mg | ORAL_TABLET | Freq: Every day | ORAL | 0 refills | Status: DC

## 2021-07-26 MED ORDER — INSULIN ASPART 100 UNIT/ML IJ SOLN
0.0000 [IU] | INTRAMUSCULAR | Status: DC
  Administered 2021-07-26: 8 [IU] via SUBCUTANEOUS
  Filled 2021-07-26: qty 1

## 2021-07-26 MED ORDER — ACETAMINOPHEN 500 MG PO TABS
1000.0000 mg | ORAL_TABLET | Freq: Once | ORAL | Status: AC
  Administered 2021-07-26: 1000 mg via ORAL
  Filled 2021-07-26: qty 2

## 2021-07-26 MED ORDER — COLCHICINE 0.6 MG PO TABS
1.2000 mg | ORAL_TABLET | ORAL | Status: AC
  Administered 2021-07-26: 1.2 mg via ORAL
  Filled 2021-07-26: qty 2

## 2021-07-26 MED ORDER — SODIUM CHLORIDE 0.9 % IR SOLN
3000.0000 mL | Status: DC
  Administered 2021-07-26: 3000 mL

## 2021-07-26 NOTE — ED Notes (Signed)
CBI Output [id number]

## 2021-07-26 NOTE — ED Triage Notes (Signed)
Pt to triage via w/c, appears uncomfortable, st had cystoscopy yesterday and has not voided since 6pm

## 2021-07-26 NOTE — ED Provider Notes (Signed)
   [facility] Provider Note    Event Date/Time   First MD Initiated Contact with Patient 07/26/21 [phone]     (approximate)   History   Urinary Retention   HPI  Randy Frazier is a 88 y.o. male  who, per urology note dated yesterday underwent cystoscopy with findings "Normal cystoscopy, no stricture disease, enlarged prostate" who presents to the emergency department today because of concern for urinary retention. He states that he last got a small amount of urine expelled at roughly 630pm yesterday evening. At that time did have a couple small blood clots as well. Since then he has not had any further urination but states he has been having bleeding. He has had significant abdominal discomfort.    Physical Exam   Triage Vital Signs: ED Triage Vitals  Enc Vitals Group     BP --      Pulse --      Resp --      Temp --      Temp src --      SpO2 --      Weight 07/26/21 0610 185 lb (83.9 kg)     Height 07/26/21 0610 6' (1.829 m)     Head Circumference --      Peak Flow --      Pain Score 07/26/21 0612 10     Pain Loc --      Pain Edu? --      Excl. in [place]? --      General: Awake, alert, oriented. CV:  Good peripheral perfusion.  Resp:  Normal effort.  Abd:  Lower abdominal distention    ED Results / Procedures / Treatments   Labs (all labs ordered are listed, but only abnormal results are displayed) Labs Reviewed - No data to display   EKG  None   RADIOLOGY None  PROCEDURES:  Critical Care performed: No  Procedures   MEDICATIONS ORDERED IN ED: Medications - No data to display   IMPRESSION / MDM / [place] / ED COURSE  I reviewed the triage vital signs and the nursing notes.                              Differential diagnosis includes, but is not limited to, urinary retention secondary to bladder spasm, blood clot.  Patient's presentation is most consistent with acute presentation with potential threat to  life or bodily function.  Patient presented to the emergency department today because of concerns for urinary retention after cystoscopy yesterday.  Foley catheter was placed by nursing staff.  Urine did appear bloody.  Patient did have relief of his discomfort.  At this time I think likely urinary retention secondary to recent procedure.  We will convert patient to leg bag and have patient follow-up with urology.   FINAL CLINICAL IMPRESSION(S) / ED DIAGNOSES   Final diagnoses:  Acute urinary retention     Note:  This document was prepared using Dragon voice recognition software and may include unintentional dictation errors.    Nance Pear, MD 07/26/21 [phone]

## 2021-07-26 NOTE — ED Notes (Signed)
See triage note  presents with foley cath in place  states he was seen during the night for urinary retention  did have foley inserted but states he is not passing any urine into bag  he is voiding around the bag   small amt of bloody urine noted

## 2021-07-26 NOTE — ED Notes (Signed)
Patient provided with education about foley catheter and need for urology follow up. Additional leg bags provided.

## 2021-07-26 NOTE — Telephone Encounter (Signed)
Received secure chat stating that pt's son called to inform us that pt was seen in the ED after cysto yesterday for acute urinary retention.   Called pt's son, no answer. Pt needs to be scheduled for 2 part voiding trial. 1st attempt.

## 2021-07-26 NOTE — ED Notes (Signed)
#  22 foley inserted for irrigation  pt tolerated well

## 2021-07-26 NOTE — Discharge Instructions (Addendum)
Please seek medical attention for any high fevers, chest pain, shortness of breath, change in behavior, persistent vomiting, bloody stool or any other new or concerning symptoms.  

## 2021-07-26 NOTE — ED Triage Notes (Signed)
Patient seen this AM and foley catheter placed. Patient was discharged and when he got to his car the catheter leaked everywhere and reports his urine is leaking around the catheter.

## 2021-07-26 NOTE — ED Notes (Signed)
Urine sent to lab 

## 2021-07-26 NOTE — ED Triage Notes (Signed)
Pt was just seen here for the same problem this AM for bladder pain after foley insertion. He notes that foley is working but its painful at the insertion site. No other concerns at this time.

## 2021-07-26 NOTE — ED Notes (Signed)
Foley flushed with 60 ml of sterile water  several small clots.[name]

## 2021-07-26 NOTE — ED Provider Notes (Signed)
[facility] Provider Note    Event Date/Time   First MD Initiated Contact with Patient 07/26/21 2020     (approximate)   History   Chief Complaint Foley Catheter problem   HPI  Randy Frazier is a 88 y.o. male with past medical history of hypertension, hyperlipidemia, diabetes, CAD status post CABG, atrial fibrillation, stroke, and BPH who presents to the ED complaining of Foley catheter problem.  Patient initially developed urinary retention overnight last night after having cystoscopy performed yesterday.  He had Foley catheter placed here in the ED, which then became obstructed with clots.  He return to the ED for significant bladder pain and had irrigation of his catheter with improvement.  He now returns with significant pain and bleeding around his urethra.  He states that he has significant irritation around his urethra and has had bleeding from around the catheter site.  The catheter continues to drain maroon-colored urine and he denies any pain in his bladder or difficulty emptying his bladder.  He states his pants were soaked with blood before he came to the ED.     Physical Exam   Triage Vital Signs: ED Triage Vitals  Enc Vitals Group     BP 07/26/21 1906 (!) 155/78     Pulse Rate 07/26/21 1906 78     Resp 07/26/21 1906 20     Temp 07/26/21 1906 98 F (36.7 C)     Temp src --      SpO2 07/26/21 1906 99 %     Weight 07/26/21 1905 184 lb 15.5 oz (83.9 kg)     Height 07/26/21 1905 6' (1.829 m)     Head Circumference --      Peak Flow --      Pain Score 07/26/21 1905 8     Pain Loc --      Pain Edu? --      Excl. in [place]? --     Most recent vital signs: Vitals:   07/26/21 1906  BP: (!) 155/78  Pulse: 78  Resp: 20  Temp: 98 F (36.7 C)  SpO2: 99%    Constitutional: Alert and oriented. Eyes: Conjunctivae are normal. Head: Atraumatic. Nose: No congestion/rhinnorhea. Mouth/Throat: Mucous membranes are moist.  Cardiovascular:  Normal rate, regular rhythm. Grossly normal heart sounds.  2+ radial pulses bilaterally. Respiratory: Normal respiratory effort.  No retractions. Lungs CTAB. Gastrointestinal: Soft and non-tender, no bladder tenderness. No distention. Genitourinary: Dried blood noted at the urethral meatus with associated irritation.  Catheter remains in place and draining maroon-colored urine. Musculoskeletal: No lower extremity tenderness nor edema.  Neurologic:  Normal speech and language. No gross focal neurologic deficits are appreciated.    ED Results / Procedures / Treatments   Labs (all labs ordered are listed, but only abnormal results are displayed) Labs Reviewed  URINALYSIS, ROUTINE W REFLEX MICROSCOPIC - Abnormal; Notable for the following components:      Result Value   Color, Urine RED (*)    APPearance CLOUDY (*)    Glucose, UA   (*)    Value: TEST NOT REPORTED DUE TO COLOR INTERFERENCE OF URINE PIGMENT   Hgb urine dipstick   (*)    Value: TEST NOT REPORTED DUE TO COLOR INTERFERENCE OF URINE PIGMENT   Bilirubin Urine   (*)    Value: TEST NOT REPORTED DUE TO COLOR INTERFERENCE OF URINE PIGMENT   Ketones, ur   (*)    Value: TEST NOT REPORTED  DUE TO COLOR INTERFERENCE OF URINE PIGMENT   Protein, ur   (*)    Value: TEST NOT REPORTED DUE TO COLOR INTERFERENCE OF URINE PIGMENT   Nitrite   (*)    Value: TEST NOT REPORTED DUE TO COLOR INTERFERENCE OF URINE PIGMENT   Leukocytes,Ua   (*)    Value: TEST NOT REPORTED DUE TO COLOR INTERFERENCE OF URINE PIGMENT   RBC / HPF >50 (*)    All other components within normal limits    PROCEDURES:  Critical Care performed: No  Procedures   MEDICATIONS ORDERED IN ED: Medications  lidocaine (XYLOCAINE) 2 % jelly 1 Application (1 Application Urethral Given 07/26/21 2054)     IMPRESSION / MDM / ASSESSMENT AND PLAN / ED COURSE  I reviewed the triage vital signs and the nursing notes.                              88 y.o. male with past medical  history of hypertension, hyperlipidemia, diabetes, CAD status post CABG, atrial fibrillation, stroke, and BPH who presents to the ED with penile pain and bleeding after Foley catheter placement earlier today for urinary retention.  Patient's presentation is most consistent with severe exacerbation of chronic illness.  Differential diagnosis includes, but is not limited to, urethral irritation, false tract, UTI, urinary retention, hematuria.  Patient uncomfortable appearing and in no acute distress, urine appears to be continuing to flow from the Foley catheter with no signs of acute retention or blockage of Foley at this time.  He does have significant irritation with dried blood at his urethral meatus, which is where the majority of his pain is located.  He has no bladder tenderness and low suspicion for UTI at this time.  We will send for urinalysis, labs from earlier today were reassuring.  We will attempt further irrigation of Foley catheter and apply lidocaine jelly to the tip of his penis for pain control.  Patient feeling much better following application of lidocaine jelly to his urethra and I suspect majority of his pain is due to previous traumatic Foley placement.  Urine is flowing appropriately with irrigation and is now lightly pink-colored.  He is appropriate for discharge home with urology follow-up, is agreeable to keeping Foley catheter in place until his appointment on Tuesday.  He was counseled to return to the ED for new worsening symptoms, patient agrees with plan.      FINAL CLINICAL IMPRESSION(S) / ED DIAGNOSES   Final diagnoses:  Irritation of urethral meatus  Gross hematuria     Rx / DC Orders   ED Discharge Orders     None        Note:  This document was prepared using Dragon voice recognition software and may include unintentional dictation errors.   Blake Divine, MD 07/26/21 2159

## 2021-07-26 NOTE — ED Provider Notes (Signed)
[facility] Provider Note    Event Date/Time   First MD Initiated Contact with Patient 07/26/21 1059     (approximate)   History   foley leaking   HPI  Randy Frazier is a 88 y.o. male with a past medical history of gout, kidney stone, HTN, HDL, A-fib, CVA, DM, CAD and BPH having undergone recent cystoscopy on 6/15 notable for enlarged prostate but grossly normal bladder mucosa having been evaluated emergency room earlier this morning for concerns for urinary retention status post Foley placement who presents for evaluation of decreased urine output from his Foley and some pain in the suprapubic region and leakage of urine around the Foley.  States the urine leaking around the Foley has been red-tinged.  States that prior to Foley placement he did pass a blood clot.  He denies any new fevers, chills, cough, chest pain, abdominal pain, back pain or any other extremity pain other than the right lateral wrist which she states has been present for 2 to 3 days and is typical of his usual gout flares.  He is requesting some colchicine for this.  He denies any trauma to this area or any other pain redness swelling.  He does note he did not take his insulin this morning.    Past Medical History:  Diagnosis Date   Actinic keratosis    Arrhythmia    A-Fib   BPH (benign prostatic hyperplasia)    Coronary artery disease 02/11/2007   CABG X 3  @ [facility]   CVA (cerebral infarction) 02/11/2012   Diabetes mellitus without complication (HCC)    Enlarged prostate    GERD (gastroesophageal reflux disease)    Hyperlipidemia    Hypertension    Kidney stone    Renal disorder    Skin cancer    scalp, tx by Dr Sharlett Iles 2000s?     Physical Exam  Triage Vital Signs: ED Triage Vitals  Enc Vitals Group     BP 07/26/21 1025 (!) 172/90     Pulse Rate 07/26/21 1025 (!) 110     Resp 07/26/21 1025 20     Temp 07/26/21 1025 97.9 F (36.6 C)     Temp Source 07/26/21 1025 Oral      SpO2 07/26/21 1025 95 %     Weight 07/26/21 1026 185 lb (83.9 kg)     Height 07/26/21 1026 6' (1.829 m)     Head Circumference --      Peak Flow --      Pain Score 07/26/21 1025 5     Pain Loc --      Pain Edu? --      Excl. in [place]? --     Most recent vital signs: Vitals:   07/26/21 1025 07/26/21 1215  BP: (!) 172/90 (!) 168/88  Pulse: (!) 110 88  Resp: 20 18  Temp: 97.9 F (36.6 C)   SpO2: 95% 96%    General: Awake, mildly, uncomfortable appearing. CV:  Good peripheral perfusion.  2+ radial pulses. Resp:  Normal effort.  Abd:  No distention.  Other:  There is a little bit of erythema edema and warmth on the ulnar posterior aspect of the right wrist.  There is no wrist effusion or skin pain on range of motion other than ulnar deviation.  Palm and fingers are unremarkable and sensation is intact in the distribution of the radial ulnar and median nerves.  There is no induration, fluctuance or streaking an  area of erythema this less than 0.5 cm oval-shaped.     ED Results / Procedures / Treatments  Labs (all labs ordered are listed, but only abnormal results are displayed) Labs Reviewed  CBC WITH DIFFERENTIAL/PLATELET - Abnormal; Notable for the following components:      Result Value   WBC 12.0 (*)    RDW 18.0 (*)    All other components within normal limits  BASIC METABOLIC PANEL - Abnormal; Notable for the following components:   Glucose, Bld 307 (*)    All other components within normal limits     EKG    RADIOLOGY    PROCEDURES:  Critical Care performed: No  Procedures    MEDICATIONS ORDERED IN ED: Medications  sodium chloride irrigation 0.9 % 3,000 mL (3,000 mLs Irrigation New Bag/Given 07/26/21 1214)  colchicine tablet 1.2 mg (has no administration in time range)  insulin aspart (novoLOG) injection 0-15 Units (has no administration in time range)  acetaminophen (TYLENOL) tablet 1,000 mg (1,000 mg Oral Given 07/26/21 1129)     IMPRESSION /  MDM / [place] / ED COURSE  I reviewed the triage vital signs and the nursing notes. Patient's presentation is most consistent with acute, uncomplicated illness.                               With regard to patient's reported decreased urine flow through his Foley catheter and leakage around the catheter differential diagnosis includes, but is not limited to dehydration and decreased urine output versus obstruction from a clot in the Foley catheter itself.  Three-way irrigation performed with removal of several clots and subsequent clamping after patient noted to have only clear fluid for several minutes.  States he is also feeling much better.  With regard to his right wrist discomfort and pain patient states it is identical location he has had gout before and feels the same.  I suspect this is more likely than a cellulitis at this time given recurrent symptoms in the same area.  CBC shows WBC of 12 somewhat nonspecific with normal hemoglobin and platelets.  BMP shows some hyperglycemia with a glucose of 307 without evidence of acidosis and normal kidney function with a creatinine of 1.13.  I think it is reasonable to give patient a loading dose of colchicine in emergency room and will prescribe 0.6 mg for him to pick up at pharmacy and went home to take for his second dose.  Discussed with him importance of close outpatient PCP follow-up to have his blood sugar and blood pressure rechecked as well as follow-up with urology for management of his Foley.  He is in agreement this plan.  Discussed returning for any new or worsening of symptoms.  Discharged in stable condition.  Strict return precautions advised and discussed.       FINAL CLINICAL IMPRESSION(S) / ED DIAGNOSES   Final diagnoses:  Problem with Foley catheter, initial encounter ([place])  Hypertension, unspecified type  Hyperglycemia  Acute gout of right wrist, unspecified cause     Rx / DC Orders   ED Discharge Orders           Ordered    colchicine 0.6 MG tablet  Daily        07/26/21 1239             Note:  This document was prepared using Dragon voice recognition software and may include unintentional dictation errors.  Lucrezia Starch, MD 07/26/21 [phone]

## 2021-07-26 NOTE — ED Notes (Addendum)
FIRST NURSE NOTE: Pt here for the third time today. Pt reports pain, no leaking but bag is draining this time. Pt is wanting [place] to remove the cath at this time. Explained that he would have check back in. Dr. Tamala Julian came up to try to speak to patient, pt was seen by Dr. Tamala Julian previously. Pt still wanting it removed. Pt is A&Ox4 and NAD. Ambulatory around triage.

## 2021-07-27 ENCOUNTER — Other Ambulatory Visit: Payer: Self-pay

## 2021-07-27 ENCOUNTER — Emergency Department
Admission: EM | Admit: 2021-07-27 | Discharge: 2021-07-27 | Disposition: A | Discharge status: Home / Self Care | Payer: Medicare Other | Attending: Emergency Medicine | Admitting: Emergency Medicine

## 2021-07-27 DIAGNOSIS — T83098A Other mechanical complication of other indwelling urethral catheter, initial encounter: Secondary | ICD-10-CM | POA: Insufficient documentation

## 2021-07-27 DIAGNOSIS — I10 Essential (primary) hypertension: Secondary | ICD-10-CM | POA: Diagnosis not present

## 2021-07-27 DIAGNOSIS — I2510 Atherosclerotic heart disease of native coronary artery without angina pectoris: Secondary | ICD-10-CM | POA: Insufficient documentation

## 2021-07-27 DIAGNOSIS — E119 Type 2 diabetes mellitus without complications: Secondary | ICD-10-CM | POA: Diagnosis not present

## 2021-07-27 DIAGNOSIS — Y732 Prosthetic and other implants, materials and accessory gastroenterology and urology devices associated with adverse incidents: Secondary | ICD-10-CM | POA: Diagnosis not present

## 2021-07-27 DIAGNOSIS — T839XXD Unspecified complication of genitourinary prosthetic device, implant and graft, subsequent encounter: Secondary | ICD-10-CM

## 2021-07-27 MED ORDER — LIDOCAINE HCL 2 % EX GEL: 1.0000 | CUTANEOUS | 0 refills | Status: DC | PRN

## 2021-07-27 MED ORDER — ACETAMINOPHEN 325 MG PO TABS
650.0000 mg | ORAL_TABLET | Freq: Once | ORAL | Status: AC
  Administered 2021-07-27: 650 mg via ORAL
  Filled 2021-07-27: qty 2

## 2021-07-27 MED ORDER — LIDOCAINE HCL URETHRAL/MUCOSAL 2 % EX GEL
1.0000 | Freq: Once | CUTANEOUS | Status: AC
  Administered 2021-07-27: 1 via URETHRAL
  Filled 2021-07-27: qty 6

## 2021-07-27 NOTE — ED Notes (Signed)
E-signature pad not functional. 

## 2021-07-27 NOTE — ED Notes (Signed)
Patient ambulated with a steady gait to lobby with stand-by assist and independently to car in the parking lot. Patient was instructed to follow-up with his urologist per discharge papers, but come back here if need to.

## 2021-07-27 NOTE — ED Provider Notes (Signed)
[facility] Provider Note    Event Date/Time   First MD Initiated Contact with Patient 07/27/21 1110     (approximate)   History   No chief complaint on file.   HPI  Randy Frazier is a 88 y.o. male with a past medical history of diabetes, hyperlipidemia, BPH with urinary obstruction, acute kidney failure who presents today with for evaluation of Foley catheter problem.  This is patient's fourth visit in 2 days.  Patient returns today with complaints of pain when the Foley catheter was inserted.  He reports that his urine continues to be dark red and bloody.  He was prescribed Xylocaine but the pharmacy was unable to fill the prescription until Thursday.  Patient is requesting that his Foley catheter be removed due to pain.  He is also requesting pain medication.  He denies any abdominal pain, nausea, vomiting, flank pain, back pain, fevers, chills.  He reports that he has pain only at the tip of his penis.  He denies the urge to urinate.  He is able to tell me everything that happened in his previous 3 visits and his attempt to go to the pharmacy.  Per chart review, patient was seen initially yesterday.  He underwent cystoscopy 2 days ago which demonstrated normal cystoscopy, no stricture disease,.  He came to the emergency department yesterday because of concern for urinary retention.  Foley catheter was placed by the nursing staff with improvement of his symptoms, and patient was discharged with follow-up with urology.  He returned yesterday with concern about leaking around his Foley catheter and decreased urine output.  He had said that the leaking around the Foley was red-tinged.  He underwent three-way irrigation and several clots were removed.  Patient also had complaints of gout and requested colchicine.  He was subsequently discharged, the return to the emergency department again for complaints of urethral pain and bleeding.  He was treated with further  irrigation of the Foley catheter and lidocaine jelly was applied to the tip of his penis for pain control.  It was felt that his pain was due to previous traumatic Foley placement.  Patient was discharged with urology follow-up on Tuesday.  Patient Active Problem List   Diagnosis Date Noted   Colonic diverticular abscess 03/30/2020   Periorbital hematoma 05/07/2016   Acute kidney failure, unspecified ([place]) 05/07/2016   Chronic sinusitis 05/07/2016   Hypertrophy of prostate with urinary obstruction and other lower urinary tract symptoms (LUTS) 05/07/2016   Benign prostatic hyperplasia with urinary obstruction 08/17/2015   Controlled type 2 diabetes mellitus without complication ([place]) 34/74/2595   Psoriasis 08/16/2015   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   DM type 2 with diabetic peripheral neuropathy ([place]) 08/10/2015   Cerebrovascular accident (CVA) ([place]) 08/18/2012   Gout 08/18/2012   Depressive disorder, not elsewhere classified 07/31/2011   BPH (benign prostatic hyperplasia) 06/19/2011   Hypertension 06/19/2011   Diabetes mellitus type II, controlled ([place]) 06/19/2010   Hyperlipidemia 12/11/2009   HYPERTENSION, BENIGN 12/11/2009   Coronary atherosclerosis 12/11/2009   CAD, AUTOLOGOUS BYPASS GRAFT 12/11/2009          Physical Exam   Triage Vital Signs: ED Triage Vitals  Enc Vitals Group     BP      Pulse      Resp      Temp      Temp src      SpO2      Weight  Height      Head Circumference      Peak Flow      Pain Score      Pain Loc      Pain Edu?      Excl. in [place]?     Most recent vital signs: Vitals:   07/27/21 1111  BP: (!) 167/95  Pulse: (!) 104  Resp: 20  Temp: 97.9 F (36.6 C)  SpO2: 96%    Physical Exam Vitals and nursing note reviewed.  Constitutional:      General: Awake and alert. No acute distress.    Appearance: Normal appearance. He is well-developed and normal weight.  HENT:     Head: Normocephalic and atraumatic.     Mouth/Throat:      Mouth: Mucous membranes are moist.  Eyes:     General: PERRL. Normal EOMs        Right eye: No discharge.        Left eye: No discharge.     Conjunctiva/sclera: Conjunctivae normal.  Cardiovascular:     Rate and Rhythm: Normal rate and regular rhythm.     Pulses: Normal pulses.  Pulmonary:     Effort: Pulmonary effort is normal. No respiratory distress.  Abdominal:     Abdomen is soft. There is no abdominal tenderness. No rebound or guarding. No distention.  No CVA tenderness GU: Foley catheter in place, the leg bag pulled down closer to his knee so there is tension on the Foley catheter.  Oozing around the Foley catheter insertion site in the urethral meatus.  There is reddened urine in the Foley bag, no obvious clots.  No bladder tenderness or abdominal tenderness.  No skin breakdown or skin ulcers Musculoskeletal:        General: No swelling. Normal range of motion.     Cervical back: Normal range of motion and neck supple.  Skin:    General: Skin is warm and dry.     Capillary Refill: Capillary refill takes less than 2 seconds.     Findings: No rash.  Neurological:     Mental Status: He is alert.  Oriented x3.  Slightly anxious in appearance but easily calmed with reassurance     ED Results / Procedures / Treatments   Labs (all labs ordered are listed, but only abnormal results are displayed) Labs Reviewed - No data to display   EKG     RADIOLOGY     PROCEDURES:  Critical Care performed:   Procedures   MEDICATIONS ORDERED IN ED: Medications  lidocaine (XYLOCAINE) 2 % jelly 1 Application (1 Application Urethral Given 07/27/21 1156)  acetaminophen (TYLENOL) tablet 650 mg (650 mg Oral Given 07/27/21 1224)     IMPRESSION / MDM / ASSESSMENT AND PLAN / ED COURSE  I reviewed the triage vital signs and the nursing notes.   Differential diagnosis includes, but is not limited to, urethral irritation, Foley catheter insertion trauma.  I do not suspect UTI given his  lack of pain, lack of odor, no fevers, chills, or infectious type symptoms.  Urinalysis yesterday does not reveal signs of UTI.  Patient is awake and alert, oriented x3, does not appear at all confused.  He is able to recall completely everything that happened during his previous 3 visits and what the urologist told him. Leg bag is positioned too far down his leg, causing tension on the foley. This was adjusted further of his leg, relieving the tension on his catheter and subsequently improving  his pain.  The area around the urethral meatus was cleaned with a wet gauze, no obvious skin breakdown or trauma.  I performed a bedside ultrasound which demonstrated a fully decompressed bladder, no obstruction.  Tip of Foley seen within the decompressed bladder on ultrasound.  I do not feel that blood work would be of much utility today given that his pain is slowly at the tip of his penis where the Foley catheter is rubbing.  Lidocaine jelly was applied here.  This also provided relief.  Patient was given the rest of the Uro-Jet to take home to use.  He was also given a prescription for more, this was printed out for him so that he may take it to a different pharmacy.  He was also treated with Tylenol.  Patient felt significantly improved with time of discharge.  He has follow-up arranged with urology he reports on Tuesday.  We discussed return precautions and the importance of this outpatient follow-up.  Patient or stands and agrees with plan.  All questions were answered and he was discharged in stable condition.  Patient was discussed with and also seen by Dr. Tamala Julian.   Patient's presentation is most consistent with acute presentation with potential threat to life or bodily function.       FINAL CLINICAL IMPRESSION(S) / ED DIAGNOSES   Final diagnoses:  Foley catheter problem, subsequent encounter     Rx / DC Orders   ED Discharge Orders          Ordered    lidocaine (XYLOCAINE) 2 % jelly  As needed         07/27/21 1215             Note:  This document was prepared using Dragon voice recognition software and may include unintentional dictation errors.   Marquette Old, PA-C 07/27/21 1342    Lucrezia Starch, MD 07/27/21 1650

## 2021-07-27 NOTE — ED Triage Notes (Signed)
Patient returns to ED with unresolved pain where foley catheter was inserted. Reports he was evaluated yesterday three times for pain and urinary retention. Urine continues to be dark red and bloody.   Reports that he was prescribed xylocaine but that [place] were unable to fill the script until Thursday. Patient request that foley is removed due to pain. Patient reports that area is very irritated and urine is still leaking around foley. Patient states he has been unable to eat/ sleep due to pain.  Patient presented to EMS base requesting that catheter was removed. Medics state that patient repeated himself multiple times and at times appeared confused.

## 2021-07-27 NOTE — Discharge Instructions (Signed)
Please call urology for follow-up.  Please return for any new, worsening, or change in symptoms or other concerns.  You may apply the lidocaine jelly as prescribed to help with your pain.  You may also take Tylenol 650 mg every 6-8 hours as needed for pain.

## 2021-07-29 ENCOUNTER — Ambulatory Visit: Payer: Medicare Other | Admitting: Physician Assistant

## 2021-07-29 ENCOUNTER — Ambulatory Visit (INDEPENDENT_AMBULATORY_CARE_PROVIDER_SITE_OTHER): Payer: Medicare Other | Admitting: Physician Assistant

## 2021-07-29 DIAGNOSIS — R310 Gross hematuria: Secondary | ICD-10-CM | POA: Diagnosis not present

## 2021-07-29 DIAGNOSIS — R338 Other retention of urine: Secondary | ICD-10-CM | POA: Diagnosis not present

## 2021-07-29 LAB — BLADDER SCAN AMB NON-IMAGING

## 2021-07-29 NOTE — Patient Instructions (Addendum)
Skip your Plavix dose today and tomorrow. You may resume Plavix on Wednesday if your urine is still yellow. If your urine looks bloody on Wednesday, stay off the Plavix until the urine returns to yellow. Stop Myrbetriq for the next week. You may resume it next Monday. Go get yourself some breakfast and go home to get some sleep. I'll see you back this afternoon to scan your bladder. You may take the over-the counter product Azo Urinary Pain Relief to help with burning with urination. A picture of the box is below. This will turn your urine bright orange. That is normal and is not a sign of bleeding.

## 2021-07-29 NOTE — Progress Notes (Signed)
07/29/2021 2:05 PM   Randy Frazier 09/28/1927 [id number]  CC: Chief Complaint  Patient presents with   Follow-up   HPI: Randy Frazier is a 88 y.o. male with PMH CAD on Plavix, diabetes, urethral stricture disease s/p numerous dilations by Dr. Ernst Spell, and BPH s/p cryoablation with a recent history of worsening LUTS who underwent cystoscopy with Dr. Diamantina Providence 4 days ago who presents today for Foley catheter removal.   He was seen in the emergency department four times over the weekend for management of urinary retention versus clot retention with gross hematuria and catheter occlusion requiring irrigation.  Today he reports they did evacuate several clots from his bladder upon initial presentation to the emergency department.  He is rather tearful in clinic today and states he had to be "held down" to place his most recent Foley catheter in the ED.  Plavix was not discontinued by ED staff, however he skipped his daily dose on Saturday but did take it yesterday.  He states he just wants his catheter taken out and is adamantly opposed to bladder irrigation in clinic today. He reports fatigue, as he has slept minimally and not eaten much in the past several days.  19 [place] three-way hematuria catheter in place today draining clear, amber urine.  There is some oozing blood noted around the Foley catheter at the meatus.  Foley catheter removed in the morning, see separate procedure note for more details.  He returned to clinic in the afternoon and has been able to urinate.  PVR 35 mL  PMH: Past Medical History:  Diagnosis Date   Actinic keratosis    Arrhythmia    A-Fib   BPH (benign prostatic hyperplasia)    Coronary artery disease 02/11/2007   CABG X 3  @ [facility]   CVA (cerebral infarction) 02/11/2012   Diabetes mellitus without complication (HCC)    Enlarged prostate    GERD (gastroesophageal reflux disease)    Hyperlipidemia    Hypertension    Kidney stone    Renal  disorder    Skin cancer    scalp, tx by Dr Sharlett Iles 2000s?    Surgical History: Past Surgical History:  Procedure Laterality Date   cataract surgery Bilateral    CORONARY ARTERY BYPASS GRAFT  2009   CABG X 3 @ [facility]   TEAR DUCT PROBING Right 05/07/2016   Procedure: Evacuation of hematoma, right peri-orbital bleed.;  Surgeon: Clista Bernhardt, MD;  Location: [facility];  Service: Ophthalmology;  Laterality: Right;    Home Medications:  Allergies as of 07/29/2021       Reactions   Ibuprofen Swelling   Penicillins Swelling   lips        Medication List        Accurate as of July 29, 2021  2:05 PM. If you have any questions, ask your nurse or doctor.          amLODipine 10 MG tablet Commonly known as: NORVASC TAKE 1 TABLET BY MOUTH EVERY DAY   aspirin EC 81 MG tablet Take 81 mg by mouth.   atorvastatin 10 MG tablet Commonly known as: LIPITOR Take 1 tablet (10 mg total) by mouth every other day. What changed: when to take this   calcipotriene 0.005 % cream Commonly known as: DOVONOX Apply to scalp twice a day x 10-14 days.   clopidogrel 75 MG tablet Commonly known as: PLAVIX TAKE 1 TABLET(75 MG) BY MOUTH DAILY What changed: See the new instructions.  colchicine 0.6 MG tablet Take 1 tablet (0.6 mg total) by mouth daily for 1 dose.   ezetimibe 10 MG tablet Commonly known as: ZETIA Take 1 tablet (10 mg total) by mouth daily.   fluorouracil 5 % cream Commonly known as: EFUDEX Apply pea sized amount to scalp twice a day x 10-14 days.   FLUoxetine 20 MG capsule Commonly known as: PROZAC Take 20 mg by mouth daily.   gabapentin 300 MG capsule Commonly known as: NEURONTIN Take 300 mg by mouth 3 (three) times daily.   gentamicin ointment 0.1 % Commonly known as: GARAMYCIN   insulin lispro 100 UNIT/ML KwikPen Commonly known as: HUMALOG INJECT 5 UNITS BEFORE MEALS 3 TIMES DAILY UP TO 50 UNITS UNDER THE SKIN , AS PRESCRIBED.   Levemir FlexPen 100 UNIT/ML  FlexPen Generic drug: insulin detemir SMARTSIG:46 Unit(s) SUB-Q Every Night   lidocaine 2 % jelly Commonly known as: XYLOCAINE Place 1 Application into the urethra as needed.   lidocaine 2 % jelly Commonly known as: XYLOCAINE Place 1 Application into the urethra as needed. Every 4 hours as needed   lisinopril 20 MG tablet Commonly known as: ZESTRIL Take 20 mg by mouth daily.   Melatonin 500 MCG Tbdp Take 500 mcg by mouth at bedtime.   metFORMIN 500 MG 24 hr tablet Commonly known as: GLUCOPHAGE-XR Take 500 mg by mouth 2 (two) times daily with a meal.   metoprolol tartrate 25 MG tablet Commonly known as: LOPRESSOR Take 25 mg by mouth 2 (two) times daily.   mirabegron ER 25 MG Tb24 tablet Commonly known as: MYRBETRIQ Take 1 tablet (25 mg total) by mouth daily.   pantoprazole 40 MG tablet Commonly known as: PROTONIX Take 40 mg by mouth daily.   tamsulosin 0.4 MG Caps capsule Commonly known as: FLOMAX Take 0.4 mg by mouth every evening.        Allergies:  Allergies  Allergen Reactions   Ibuprofen Swelling   Penicillins Swelling    lips    Family History: Family History  Family history unknown: Yes    Social History:   reports that he has never smoked. He has never used smokeless tobacco. He reports that he does not drink alcohol and does not use drugs.  Physical Exam: There were no vitals taken for this visit.  Constitutional:  Alert and oriented, no acute distress, nontoxic appearing HEENT: [place], AT Cardiovascular: No clubbing, cyanosis, or edema Respiratory: Normal respiratory effort, no increased work of breathing Skin: No rashes, bruises or suspicious lesions Neurologic: Grossly intact, no focal deficits, moving all 4 extremities Psychiatric: Tearful mood and affect in the morning, normal mood and affect in the afternoon  Laboratory Data: Results for orders placed or performed in visit on 07/29/21  Bladder Scan (Post Void Residual) in office   Result Value Ref Range   Scan Result 33m    Assessment & Plan:   1. Acute urinary retention Post cystoscopy urinary retention versus clot retention over the weekend requiring multiple ED visits.  He skipped 1 dose of Plavix and his urine is clear amber this morning.  We scanned his bladder at the bedside with 20 cc of volume noted.  With no significant gross hematuria and low bladder scan volume, I do not suspect ongoing clot retention and elected to defer repeat bladder irrigation due to patient discomfort.  Voiding trial passed today.  I asked him to skip his Plavix today and tomorrow and may resume Wednesday in the absence of gross hematuria.  I have asked him to stop Myrbetriq for the next week.  We will have him follow-up in clinic next month as previously scheduled.  Okay to use Azo urinary pain relief for dysuria likely secondary to Foley placement.  He is in agreement with this plan. - Bladder Scan (Post Void Residual) in office  2. Gross hematuria See above.   Return in about 4 weeks (around 08/26/2021) for Follow-up with Dr. Diamantina Providence.  Debroah Loop, PA-C  [facility] [address] [place], [place] [address] [phone]

## 2021-07-29 NOTE — Progress Notes (Signed)
Catheter Removal  Patient is present today for a catheter removal.  24m of water was drained from the balloon. A 24FR foley cath was removed from the bladder no complications were noted . Patient tolerated well.  Performed by: SFonnie Jarvis CMA

## 2021-07-29 NOTE — Telephone Encounter (Signed)
Pt seen in office on 07/29/21.

## 2021-08-05 ENCOUNTER — Telehealth: Payer: Self-pay | Admitting: *Deleted

## 2021-08-05 NOTE — Telephone Encounter (Signed)
.  left message to have patient return my call.   Calling per Sam to check on pt and how he's doing.

## 2021-09-05 ENCOUNTER — Ambulatory Visit: Payer: Medicare Other | Admitting: Urology

## 2021-09-10 ENCOUNTER — Ambulatory Visit: Payer: Medicare Other | Admitting: Dermatology

## 2021-12-08 ENCOUNTER — Emergency Department: Payer: Medicare Other

## 2021-12-08 ENCOUNTER — Other Ambulatory Visit: Payer: Self-pay

## 2021-12-08 ENCOUNTER — Encounter: Payer: Self-pay | Admitting: Emergency Medicine

## 2021-12-08 ENCOUNTER — Observation Stay
Admission: EM | Admit: 2021-12-08 | Discharge: 2021-12-09 | Disposition: A | Discharge status: Home / Self Care | Payer: Medicare Other | Attending: Internal Medicine | Admitting: Internal Medicine

## 2021-12-08 DIAGNOSIS — Z85828 Personal history of other malignant neoplasm of skin: Secondary | ICD-10-CM | POA: Diagnosis not present

## 2021-12-08 DIAGNOSIS — Z7902 Long term (current) use of antithrombotics/antiplatelets: Secondary | ICD-10-CM | POA: Insufficient documentation

## 2021-12-08 DIAGNOSIS — Z79899 Other long term (current) drug therapy: Secondary | ICD-10-CM | POA: Insufficient documentation

## 2021-12-08 DIAGNOSIS — D72829 Elevated white blood cell count, unspecified: Secondary | ICD-10-CM | POA: Insufficient documentation

## 2021-12-08 DIAGNOSIS — I4891 Unspecified atrial fibrillation: Secondary | ICD-10-CM | POA: Insufficient documentation

## 2021-12-08 DIAGNOSIS — Z7982 Long term (current) use of aspirin: Secondary | ICD-10-CM | POA: Diagnosis not present

## 2021-12-08 DIAGNOSIS — R1012 Left upper quadrant pain: Secondary | ICD-10-CM | POA: Diagnosis not present

## 2021-12-08 DIAGNOSIS — E1140 Type 2 diabetes mellitus with diabetic neuropathy, unspecified: Secondary | ICD-10-CM | POA: Insufficient documentation

## 2021-12-08 DIAGNOSIS — R1013 Epigastric pain: Secondary | ICD-10-CM | POA: Diagnosis present

## 2021-12-08 DIAGNOSIS — I10 Essential (primary) hypertension: Secondary | ICD-10-CM | POA: Diagnosis not present

## 2021-12-08 DIAGNOSIS — R791 Abnormal coagulation profile: Secondary | ICD-10-CM | POA: Diagnosis not present

## 2021-12-08 DIAGNOSIS — R079 Chest pain, unspecified: Secondary | ICD-10-CM | POA: Diagnosis present

## 2021-12-08 DIAGNOSIS — E1142 Type 2 diabetes mellitus with diabetic polyneuropathy: Secondary | ICD-10-CM | POA: Diagnosis present

## 2021-12-08 DIAGNOSIS — Z951 Presence of aortocoronary bypass graft: Secondary | ICD-10-CM | POA: Diagnosis not present

## 2021-12-08 DIAGNOSIS — R0781 Pleurodynia: Principal | ICD-10-CM | POA: Insufficient documentation

## 2021-12-08 DIAGNOSIS — Z7984 Long term (current) use of oral hypoglycemic drugs: Secondary | ICD-10-CM | POA: Diagnosis not present

## 2021-12-08 DIAGNOSIS — K7460 Unspecified cirrhosis of liver: Secondary | ICD-10-CM

## 2021-12-08 DIAGNOSIS — R7989 Other specified abnormal findings of blood chemistry: Secondary | ICD-10-CM

## 2021-12-08 DIAGNOSIS — Z8673 Personal history of transient ischemic attack (TIA), and cerebral infarction without residual deficits: Secondary | ICD-10-CM

## 2021-12-08 DIAGNOSIS — R109 Unspecified abdominal pain: Secondary | ICD-10-CM

## 2021-12-08 DIAGNOSIS — R1010 Upper abdominal pain, unspecified: Secondary | ICD-10-CM

## 2021-12-08 DIAGNOSIS — Z794 Long term (current) use of insulin: Secondary | ICD-10-CM | POA: Insufficient documentation

## 2021-12-08 DIAGNOSIS — I2510 Atherosclerotic heart disease of native coronary artery without angina pectoris: Secondary | ICD-10-CM | POA: Diagnosis not present

## 2021-12-08 LAB — BASIC METABOLIC PANEL
Anion gap: 12 (ref 5–15)
Anion gap: 6 (ref 5–15)
BUN: 28 mg/dL — ABNORMAL HIGH (ref 8–23)
BUN: 27 mg/dL — ABNORMAL HIGH (ref 8–23)
CO2: 23 mmol/L (ref 22–32)
CO2: 23 mmol/L (ref 22–32)
Calcium: 9.6 mg/dL (ref 8.9–10.3)
Calcium: 8.2 mg/dL — ABNORMAL LOW (ref 8.9–10.3)
Chloride: 99 mmol/L (ref 98–111)
Chloride: 106 mmol/L (ref 98–111)
Creatinine, Ser: 1.56 mg/dL — ABNORMAL HIGH (ref 0.61–1.24)
Creatinine, Ser: 1.07 mg/dL (ref 0.61–1.24)
GFR, Estimated: 42 mL/min — ABNORMAL LOW (ref >60)
GFR, Estimated: >60 mL/min (ref >60)
Glucose, Bld: 185 mg/dL — ABNORMAL HIGH (ref 70–99)
Glucose, Bld: 121 mg/dL — ABNORMAL HIGH (ref 70–99)
Potassium: 4.6 mmol/L (ref 3.5–5.1)
Potassium: 4.2 mmol/L (ref 3.5–5.1)
Sodium: 134 mmol/L — ABNORMAL LOW (ref 135–145)
Sodium: 135 mmol/L (ref 135–145)

## 2021-12-08 LAB — CBC
HCT: 41.3 % (ref 39.0–52.0)
Hemoglobin: 13.2 g/dL (ref 13.0–17.0)
MCH: 27.6 pg (ref 26.0–34.0)
MCHC: 32 g/dL (ref 30.0–36.0)
MCV: 86.4 fL (ref 80.0–100.0)
Platelets: 165 10*3/uL (ref 150–400)
RBC: 4.78 MIL/uL (ref 4.22–5.81)
RDW: 19.6 % — ABNORMAL HIGH (ref 11.5–15.5)
WBC: 14.6 10*3/uL — ABNORMAL HIGH (ref 4.0–10.5)
nRBC: 0 % (ref 0.0–0.2)

## 2021-12-08 LAB — URINALYSIS, ROUTINE W REFLEX MICROSCOPIC
Bilirubin Urine: NEGATIVE
Glucose, UA: NEGATIVE mg/dL
Hgb urine dipstick: NEGATIVE
Ketones, ur: NEGATIVE mg/dL
Leukocytes,Ua: NEGATIVE
Nitrite: NEGATIVE
Protein, ur: NEGATIVE mg/dL
Specific Gravity, Urine: 1.017 (ref 1.005–1.030)
pH: 5 (ref 5.0–8.0)

## 2021-12-08 LAB — CBG MONITORING, ED: Glucose-Capillary: 134 mg/dL — ABNORMAL HIGH (ref 70–99)

## 2021-12-08 LAB — TROPONIN I (HIGH SENSITIVITY)
Troponin I (High Sensitivity): 6 ng/L (ref <18)
Troponin I (High Sensitivity): 7 ng/L (ref <18)

## 2021-12-08 LAB — D-DIMER, QUANTITATIVE: D-Dimer, Quant: 0.78 ug/mL-FEU — ABNORMAL HIGH (ref 0.00–0.50)

## 2021-12-08 MED ORDER — SODIUM CHLORIDE 0.9 % IV BOLUS
1000.0000 mL | Freq: Once | INTRAVENOUS | Status: AC
  Administered 2021-12-08: 1000 mL via INTRAVENOUS

## 2021-12-08 MED ORDER — SIMETHICONE 40 MG/0.6ML PO SUSP: 80.0000 mg | Freq: Four times a day (QID) | ORAL | Status: DC | PRN

## 2021-12-08 MED ORDER — ACETAMINOPHEN 325 MG PO TABS: 650.0000 mg | ORAL_TABLET | ORAL | Status: DC | PRN

## 2021-12-08 MED ORDER — ASPIRIN 81 MG PO CHEW
324.0000 mg | CHEWABLE_TABLET | ORAL | Status: AC
  Administered 2021-12-09: 324 mg via ORAL
  Filled 2021-12-08: qty 4

## 2021-12-08 MED ORDER — ONDANSETRON HCL 4 MG/2ML IJ SOLN: 4.0000 mg | Freq: Four times a day (QID) | INTRAMUSCULAR | Status: DC | PRN

## 2021-12-08 MED ORDER — IOHEXOL 350 MG/ML SOLN
75.0000 mL | Freq: Once | INTRAVENOUS | Status: AC | PRN
  Administered 2021-12-09: 75 mL via INTRAVENOUS

## 2021-12-08 MED ORDER — ASPIRIN 300 MG RE SUPP: 300.0000 mg | RECTAL | Status: AC

## 2021-12-08 MED ORDER — ASPIRIN 81 MG PO TBEC: 81.0000 mg | DELAYED_RELEASE_TABLET | Freq: Every day | ORAL | Status: DC

## 2021-12-08 MED ORDER — ENOXAPARIN SODIUM 80 MG/0.8ML IJ SOSY
80.0000 mg | PREFILLED_SYRINGE | INTRAMUSCULAR | Status: AC
  Administered 2021-12-09: 80 mg via SUBCUTANEOUS
  Filled 2021-12-08: qty 0.8

## 2021-12-08 MED ORDER — INSULIN DETEMIR 100 UNIT/ML FLEXPEN: 10.0000 [IU] | PEN_INJECTOR | Freq: Every day | SUBCUTANEOUS | Status: DC

## 2021-12-08 MED ORDER — METOPROLOL TARTRATE 25 MG PO TABS
25.0000 mg | ORAL_TABLET | Freq: Two times a day (BID) | ORAL | Status: DC
  Administered 2021-12-09: 25 mg via ORAL
  Filled 2021-12-08: qty 1

## 2021-12-08 MED ORDER — MIRABEGRON ER 25 MG PO TB24: 25.0000 mg | ORAL_TABLET | Freq: Every day | ORAL | Status: DC

## 2021-12-08 MED ORDER — ENOXAPARIN SODIUM 40 MG/0.4ML IJ SOSY: 40.0000 mg | PREFILLED_SYRINGE | INTRAMUSCULAR | Status: DC

## 2021-12-08 MED ORDER — INSULIN ASPART 100 UNIT/ML IJ SOLN
0.0000 [IU] | Freq: Every day | INTRAMUSCULAR | Status: DC
  Administered 2021-12-09: 2 [IU] via SUBCUTANEOUS
  Filled 2021-12-08: qty 1

## 2021-12-08 MED ORDER — NITROGLYCERIN 0.4 MG SL SUBL: 0.4000 mg | SUBLINGUAL_TABLET | SUBLINGUAL | Status: DC | PRN

## 2021-12-08 MED ORDER — CLOPIDOGREL BISULFATE 75 MG PO TABS: 75.0000 mg | ORAL_TABLET | Freq: Every day | ORAL | Status: DC

## 2021-12-08 MED ORDER — PANTOPRAZOLE SODIUM 40 MG PO TBEC: 40.0000 mg | DELAYED_RELEASE_TABLET | Freq: Every day | ORAL | Status: DC

## 2021-12-08 MED ORDER — IOHEXOL 300 MG/ML  SOLN
80.0000 mL | Freq: Once | INTRAMUSCULAR | Status: AC | PRN
  Administered 2021-12-08: 80 mL via INTRAVENOUS

## 2021-12-08 MED ORDER — INSULIN ASPART 100 UNIT/ML IJ SOLN: 0.0000 [IU] | Freq: Three times a day (TID) | INTRAMUSCULAR | Status: DC

## 2021-12-08 MED ORDER — AMLODIPINE BESYLATE 5 MG PO TABS: 10.0000 mg | ORAL_TABLET | Freq: Every day | ORAL | Status: DC

## 2021-12-08 MED ORDER — ATORVASTATIN CALCIUM 20 MG PO TABS: 10.0000 mg | ORAL_TABLET | Freq: Every day | ORAL | Status: DC

## 2021-12-08 NOTE — ED Triage Notes (Signed)
Pt with central chest pain and hypertension x 2 days. Went to [facility] and was sent over for eval. Nausea this morning.

## 2021-12-08 NOTE — H&P (Signed)
History and Physical    Patient: Randy Frazier JJK:[id number] DOB: [birth year] DOA: 12/08/2021 DOS: the patient was seen and examined on 12/08/2021 PCP: Pcp, No  Patient coming from: Home  Chief Complaint:  Chief Complaint  Patient presents with   Chest Pain    HPI: Randy Frazier is a 88 y.o. male with medical history significant for hypertension, hyperlipidemia, diabetes, coronary artery disease status post CABG, and CVA who presents to the ED with 2-day history of lower retrosternal chest pain radiating to mid and upper abdomen, the left flank and back.  It is associated with nausea, and abdominal bloating.  He denies vomiting or diarrhea or dysuria.  He also noted that his blood pressure has been running high at home.  He denies shortness of breath, cough, fever or chills. ED course and data review: BP in the ED ranging from the 140s to the low 180s with otherwise normal vitals.  Labs with WBC 14,600, troponin 7, D-dimer 0.78, urinalysis unremarkable EKG, p personally reviewed and interpreted.  Showing NSR at 78 with RBBB.  CT abdomen and pelvis with contrast shows the following: IMPRESSION: 1. No acute process in the abdomen or pelvis. 2. Hepatic lesion which is incompletely characterized on this exam. Nonemergent MR abdomen without and with contrast could be performed for further evaluation if clinically indicated. 3. 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   CTA chest was ordered but could not be performed due to recent contrasted study.  Patient treated with a 2 L NS bolus and hospitalist consulted for admission.   Review of Systems: As mentioned in the history of present illness. All other systems reviewed and are negative.  Past Medical History:  Diagnosis Date   Actinic keratosis    Arrhythmia    A-Fib   BPH (benign prostatic hyperplasia)    Coronary artery disease 02/11/2007   CABG X 3  @ [facility]   CVA (cerebral  infarction) 02/11/2012   Diabetes mellitus without complication (HCC)    Enlarged prostate    GERD (gastroesophageal reflux disease)    Hyperlipidemia    Hypertension    Kidney stone    Renal disorder    Skin cancer    scalp, tx by Dr Sharlett Iles 2000s?   Past Surgical History:  Procedure Laterality Date   cataract surgery Bilateral    CORONARY ARTERY BYPASS GRAFT  2009   CABG X 3 @ [facility]   TEAR DUCT PROBING Right 05/07/2016   Procedure: Evacuation of hematoma, right peri-orbital bleed.;  Surgeon: Clista Bernhardt, MD;  Location: [place];  Service: Ophthalmology;  Laterality: Right;   Social History:  reports that he has never smoked. He has never used smokeless tobacco. He reports that he does not drink alcohol and does not use drugs.  Allergies  Allergen Reactions   Ibuprofen Swelling   Penicillins Swelling    lips    Family History  Family history unknown: Yes    Prior to Admission medications   Medication Sig Start Date End Date Taking? Authorizing Provider  amLODipine (NORVASC) 10 MG tablet TAKE 1 TABLET BY MOUTH EVERY DAY Patient taking differently: Take 10 mg by mouth daily. 10/03/12   Minna Merritts, MD  aspirin EC 81 MG tablet Take 81 mg by mouth.    [provider]  atorvastatin (LIPITOR) 10 MG tablet Take 1 tablet (10 mg total) by mouth every other day. Patient taking differently: Take 10 mg by mouth daily. 12/05/19  Minna Merritts, MD  calcipotriene (DOVONOX) 0.005 % cream Apply to scalp twice a day x 10-14 days. 05/14/21   Brendolyn Patty, MD  clopidogrel (PLAVIX) 75 MG tablet TAKE 1 TABLET(75 MG) BY MOUTH DAILY Patient taking differently: Take 75 mg by mouth daily. 08/20/17   Minna Merritts, MD  colchicine 0.6 MG tablet Take 1 tablet (0.6 mg total) by mouth daily for 1 dose. 07/26/21 07/27/21  Lucrezia Starch, MD  ezetimibe (ZETIA) 10 MG tablet Take 1 tablet (10 mg total) by mouth daily. 12/05/19   Minna Merritts, MD  fluorouracil (EFUDEX) 5 % cream  Apply pea sized amount to scalp twice a day x 10-14 days. 05/23/21   Brendolyn Patty, MD  FLUoxetine (PROZAC) 20 MG capsule Take 20 mg by mouth daily.    [provider]  gabapentin (NEURONTIN) 300 MG capsule Take 300 mg by mouth 3 (three) times daily.  01/24/12   [provider]  gentamicin ointment (GARAMYCIN) 0.1 %  06/10/21   [provider]  insulin lispro (HUMALOG) 100 UNIT/ML KwikPen INJECT 5 UNITS BEFORE MEALS 3 TIMES DAILY UP TO 50 UNITS UNDER THE SKIN , AS PRESCRIBED. 04/17/21   [provider]  LEVEMIR FLEXPEN 100 UNIT/ML FlexPen SMARTSIG:46 Unit(s) SUB-Q Every Night 05/14/21   [provider]  lidocaine (XYLOCAINE) 2 % jelly Place 1 Application into the urethra as needed. 07/26/21   Blake Divine, MD  lidocaine (XYLOCAINE) 2 % jelly Place 1 Application into the urethra as needed. Every 4 hours as needed 07/27/21   Poggi, Eliezer Lofts E, PA-C  lisinopril (PRINIVIL,ZESTRIL) 20 MG tablet Take 20 mg by mouth daily.  01/27/13   [provider]  Melatonin 500 MCG TBDP Take 500 mcg by mouth at bedtime.    [provider]  metFORMIN (GLUCOPHAGE-XR) 500 MG 24 hr tablet Take 500 mg by mouth 2 (two) times daily with a meal.    [provider]  metoprolol tartrate (LOPRESSOR) 25 MG tablet Take 25 mg by mouth 2 (two) times daily.    [provider]  mirabegron ER (MYRBETRIQ) 25 MG TB24 tablet Take 1 tablet (25 mg total) by mouth daily. 07/25/21   Billey Co, MD  pantoprazole (PROTONIX) 40 MG tablet Take 40 mg by mouth daily.    [provider]  tamsulosin (FLOMAX) 0.4 MG CAPS capsule Take 0.4 mg by mouth every evening.    [provider]    Physical Exam: Vitals:   12/08/21 1830 12/08/21 1900 12/08/21 1915 12/08/21 2000  BP: (!) 167/74 (!) 182/85 (!) 165/88 (!) 167/75  Pulse: 69 70 72 71  Resp: 17 (!) 21 18 (!) 22  Temp:      TempSrc:      SpO2: 96% 95% 97% 94%   Physical Exam  Labs on Admission: I  have personally reviewed following labs and imaging studies  CBC: Recent Labs  Lab 12/08/21 1357  WBC 14.6*  HGB 13.2  HCT 41.3  MCV 86.4  PLT 983   Basic Metabolic Panel: Recent Labs  Lab 12/08/21 1357 12/08/21 1840  NA 134* 135  K 4.6 4.2  CL 99 106  CO2 23 23  GLUCOSE 185* 121*  BUN 28* 27*  CREATININE 1.56* 1.07  CALCIUM 9.6 8.2*   GFR: CrCl cannot be calculated (Unknown ideal weight.). Liver Function Tests: No results for input(s): "AST", "ALT", "ALKPHOS", "BILITOT", "PROT", "ALBUMIN" in the last 168 hours. No results for input(s): "LIPASE", "AMYLASE" in the last  168 hours. No results for input(s): "AMMONIA" in the last 168 hours. Coagulation Profile: No results for input(s): "INR", "PROTIME" in the last 168 hours. Cardiac Enzymes: No results for input(s): "CKTOTAL", "CKMB", "CKMBINDEX", "TROPONINI" in the last 168 hours. BNP (last 3 results) No results for input(s): "PROBNP" in the last 8760 hours. HbA1C: No results for input(s): "HGBA1C" in the last 72 hours. CBG: Recent Labs  Lab 12/08/21 2057  GLUCAP 134*   Lipid Profile: No results for input(s): "CHOL", "HDL", "LDLCALC", "TRIG", "CHOLHDL", "LDLDIRECT" in the last 72 hours. Thyroid Function Tests: No results for input(s): "TSH", "T4TOTAL", "FREET4", "T3FREE", "THYROIDAB" in the last 72 hours. Anemia Panel: No results for input(s): "VITAMINB12", "FOLATE", "FERRITIN", "TIBC", "IRON", "RETICCTPCT" in the last 72 hours. Urine analysis:    Component Value Date/Time   COLORURINE YELLOW (A) 12/08/2021 1522   APPEARANCEUR CLEAR (A) 12/08/2021 1522   APPEARANCEUR Clear 03/30/2013 0839   LABSPEC 1.017 12/08/2021 1522   LABSPEC 1.023 03/30/2013 0839   PHURINE 5.0 12/08/2021 1522   GLUCOSEU NEGATIVE 12/08/2021 1522   GLUCOSEU 150 mg/dL 03/30/2013 0839   HGBUR NEGATIVE 12/08/2021 1522   BILIRUBINUR NEGATIVE 12/08/2021 1522   BILIRUBINUR Negative 03/30/2013 0839   KETONESUR NEGATIVE 12/08/2021 1522    PROTEINUR NEGATIVE 12/08/2021 1522   NITRITE NEGATIVE 12/08/2021 [place] 12/08/2021 1522   LEUKOCYTESUR Negative 03/30/2013 0839    Radiological Exams on Admission: CT ABDOMEN PELVIS W CONTRAST  Result Date: 12/08/2021 CLINICAL DATA:  Abdominal pain EXAM: CT ABDOMEN AND PELVIS WITH CONTRAST TECHNIQUE: Multidetector CT imaging of the abdomen and pelvis was performed using the standard protocol following bolus administration of intravenous contrast. RADIATION DOSE REDUCTION: This exam was performed according to the departmental dose-optimization program which includes automated exposure control, adjustment of the mA and/or kV according to patient size and/or use of iterative reconstruction technique. CONTRAST:  53m OMNIPAQUE IOHEXOL 300 MG/ML  SOLN COMPARISON:  CT abdomen pelvis dated 04/01/2020. FINDINGS: Lower chest: There is mild bibasilar atelectasis. Hepatobiliary: The previously described enhancing lesion in the left hepatic lobe near the dome measures approximately 3.6 x 2.5 cm (series 2, image 15) and is incompletely characterized on this exam. Gallstones are noted. No gallbladder wall thickening or biliary dilatation. Pancreas: Unremarkable. No pancreatic ductal dilatation. Spleen: Normal in size without focal abnormality. Adrenals/Urinary Tract: Adrenal glands are unremarkable. Bilateral renal cysts appear similar to prior exam, measuring up to 9 cm on the right. No imaging follow-up is recommended for this finding. Otherwise, the kidneys are normal, without renal calculi, focal solid lesion, or hydronephrosis. Bladder is unremarkable. Stomach/Bowel: Stomach is within normal limits. Appendix appears normal. There is colonic diverticulosis without evidence of diverticulitis. No evidence of bowel wall thickening, distention, or inflammatory changes. Vascular/Lymphatic: Aortic atherosclerosis. Multiple mesenteric lymph nodes are at the upper limits of normal. 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 Reproductive: The prostate is enlarged, measuring 5.2 cm in transverse dimension. Other: No abdominal wall hernia or abnormality. No abdominopelvic ascites. Musculoskeletal: Degenerative changes are seen in the spine. IMPRESSION: 1. No acute process in the abdomen or pelvis. 2. Hepatic lesion which is incompletely characterized on this exam. Nonemergent MR abdomen without and with contrast could be performed for further evaluation if clinically indicated. 3. 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 Aortic Atherosclerosis (ICD10-I70.0). Electronically Signed   By: TZerita BoersM.D.   On: 12/08/2021 17:17  DG Chest 2 View  Result Date: 12/08/2021 CLINICAL DATA:  Chest pain. EXAM: CHEST - 2 VIEW COMPARISON:  05/10/2016 FINDINGS: Prior median sternotomy and CABG.The cardiomediastinal contours are normal. Subsegmental atelectasis or scar at the left lung base. Pulmonary vasculature is normal. No consolidation, pleural effusion, or pneumothorax. No acute osseous abnormalities are seen. IMPRESSION: Subsegmental atelectasis or scar at the left lung base. Otherwise no acute abnormality. Electronically Signed   By: Keith Rake M.D.   On: 12/08/2021 14:14     Data Reviewed: Relevant notes from primary care and specialist visits, past discharge summaries as available in EHR, including Care Everywhere. Prior diagnostic testing as pertinent to current admission diagnoses Updated medications and problem lists for reconciliation ED course, including vitals, labs, imaging, treatment and response to treatment Triage notes, nursing and pharmacy notes and ED provider's notes Notable results as noted in HPI   Assessment and Plan: * Chest pain CAD s/p CABG Uncertain etiology of chest pain cardiac versus GI.   Elevated D-dimer raises possibility of PE First troponin of 7 and EKG nonacute Continue to trend troponins Continue metoprolol, lisinopril, clopidogrel, atorvastatin and aspirin and ezetimibe Nitroglycerin as needed chest pain with morphine for breakthrough For possibility of PE: Will give a one-time therapeutic dose of Lovenox and get CTA chest or V/Q in the a.m.  Left flank pain Cardiac versus GI CT abdomen and pelvis showed no acute process but did show a hepatic lesion that was incompletely characterized. Please see full report for details Continue to rule out for ACS as above Empiric PPI and as needed simethicone  History of CVA (cerebrovascular accident) Continue aspirin and atorvastatin  DM type 2 with diabetic peripheral neuropathy ([place]) Continue basal insulin but at a reduced dose Sliding scale insulin Continue gabapentin for neuropathy Hold metformin  HYPERTENSION, BENIGN Continue amlodipine, lisinopril and metoprolol        DVT prophylaxis: Lovenox  Consults: [facility] cardiology  Advance Care Planning:   Code Status: Prior   Family Communication: none  Disposition Plan: Back to previous home environment  Severity of Illness: The appropriate patient status for this patient is OBSERVATION. Observation status is judged to be reasonable and necessary in order to provide the required intensity of service to ensure the patient's safety. The patient's presenting symptoms, physical exam findings, and initial radiographic and laboratory data in the context of their medical condition is felt to place them at decreased risk for further clinical deterioration. Furthermore, it is anticipated that the patient will be medically stable for discharge from the hospital within 2 midnights of admission.   Author: Athena Masse, MD 12/08/2021 9:39 PM  For on call review [email or web address].

## 2021-12-08 NOTE — ED Notes (Signed)
Repeat BMP is to be drawn AFTER second 1L bolus and before next CT scan.

## 2021-12-08 NOTE — Assessment & Plan Note (Addendum)
CAD s/p CABG Uncertain etiology of chest pain cardiac versus GI.  Elevated D-dimer raises possibility of PE First troponin of 7 and EKG nonacute Continue to trend troponins Continue metoprolol, lisinopril, clopidogrel, atorvastatin and aspirin and ezetimibe Nitroglycerin as needed chest pain with morphine for breakthrough For possibility of PE: Will give a one-time therapeutic dose of Lovenox and get CTA chest or V/Q in the a.m.

## 2021-12-08 NOTE — ED Provider Notes (Signed)
[facility] Provider Note    Event Date/Time   First MD Initiated Contact with Patient 12/08/21 1459     (approximate)   History   Chest Pain   HPI  Randy Frazier is a 88 y.o. male who complains of 3 days of pain in his left upper stomach radiating around to the back.  Pain has been fairly constant.  It is not made worse with deep breathing or exercise.  Is not made worse with eating.  Nothing really seems to make it better either.  It was tender to palpation at home he says.  Additionally patient's blood pressures been up for several days.  At home it was up to 423 systolic.  Is higher in his left arm than the right arm.   Past medical history:  Hypertension I10    Hyperlipidemia E78.5    Arrhythmia I49.9  A-Fib  Coronary artery disease I25.10 02/11/2007 CABG X 3  @ [facility]  GERD (gastroesophageal reflux disease) K21.9    Enlarged prostate N40.0    Renal disorder N28.9    CVA (cerebral infarction) I63.9 02/11/2012   Diabetes mellitus without complication (HCC) N36.1    BPH (benign prostatic hyperplasia) N40.0    Actinic keratosis L57.0    Skin cancer C44.90  scalp, tx by Dr Sharlett Iles 2000s?  Kidney stone N20.0      Physical Exam   Triage Vital Signs: ED Triage Vitals 12/08/21 1348  Enc Vitals Group     BP 132/84     Pulse Rate 76     Resp 18     Temp 97.6 F (36.4 C)     Temp Source Oral     SpO2 92 %     Weight      Height      Head Circumference      Peak Flow      Pain Score 3     Pain Loc      Pain Edu?      Excl. in [place]?     Most recent vital signs: Vitals:   12/08/21 2320 12/08/21 2323  BP: 128/68   Pulse: 74   Resp: (!) 21   Temp:  97.8 F (36.6 C)  SpO2: 90%      General: Awake, no distress.  CV:  Good peripheral perfusion.  Heart regular rate and rhythm no audible murmurs Resp:  Normal effort.  Lungs are clear except for small amount of crackles in the right midlung field Abd:  No distention.  Soft and  nontender except for mild tenderness in the anterior axillary line on the left.  There is no CVA tenderness    ED Results / Procedures / Treatments   Labs (all labs ordered are listed, but only abnormal results are displayed) Labs Reviewed  BASIC METABOLIC PANEL - Abnormal; Notable for the following components:      Result Value   Sodium 134 (*)    Glucose, Bld 185 (*)    BUN 28 (*)    Creatinine, Ser 1.56 (*)    GFR, Estimated 42 (*)    All other components within normal limits  CBC - Abnormal; Notable for the following components:   WBC 14.6 (*)    RDW 19.6 (*)    All other components within normal limits  URINALYSIS, ROUTINE W REFLEX MICROSCOPIC - Abnormal; Notable for the following components:   Color, Urine YELLOW (*)    APPearance CLEAR (*)    All  other components within normal limits  D-DIMER, QUANTITATIVE - Abnormal; Notable for the following components:   D-Dimer, Quant 0.78 (*)    All other components within normal limits  BASIC METABOLIC PANEL - Abnormal; Notable for the following components:   Glucose, Bld 121 (*)    BUN 27 (*)    Calcium 8.2 (*)    All other components within normal limits  CBG MONITORING, ED - Abnormal; Notable for the following components:   Glucose-Capillary 134 (*)    All other components within normal limits  LIPOPROTEIN A (LPA)  HEMOGLOBIN A1C  TROPONIN I (HIGH SENSITIVITY)  TROPONIN I (HIGH SENSITIVITY)     EKG  EKG read interpreted by me shows normal sinus rhythm rate of 78 left axis left bundle branch block looks similar to EKG from February of last year.   RADIOLOGY Chest x-ray read by radiology reviewed and interpreted by me shows a small amount of haziness on the left diaphragm radiology reads this is possible atelectasis.   PROCEDURES:  Critical Care performed:   Procedures   MEDICATIONS ORDERED IN ED: Medications  iohexol (OMNIPAQUE) 350 MG/ML injection 75 mL (has no administration in time range)  amLODipine  (NORVASC) tablet 10 mg (has no administration in time range)  atorvastatin (LIPITOR) tablet 10 mg (has no administration in time range)  metoprolol tartrate (LOPRESSOR) tablet 25 mg (has no administration in time range)  insulin detemir (LEVEMIR) FlexPen 10 Units (has no administration in time range)  mirabegron ER (MYRBETRIQ) tablet 25 mg (has no administration in time range)  clopidogrel (PLAVIX) tablet 75 mg (has no administration in time range)  aspirin chewable tablet 324 mg (has no administration in time range)    Or  aspirin suppository 300 mg (has no administration in time range)  aspirin EC tablet 81 mg (has no administration in time range)  nitroGLYCERIN (NITROSTAT) SL tablet 0.4 mg (has no administration in time range)  acetaminophen (TYLENOL) tablet 650 mg (has no administration in time range)  ondansetron (ZOFRAN) injection 4 mg (has no administration in time range)  enoxaparin (LOVENOX) injection 40 mg (has no administration in time range)  enoxaparin (LOVENOX) 100 mg/mL injection 1 mg/kg (has no administration in time range)  insulin aspart (novoLOG) injection 0-15 Units (has no administration in time range)  insulin aspart (novoLOG) injection 0-5 Units (has no administration in time range)  pantoprazole (PROTONIX) EC tablet 40 mg (has no administration in time range)  simethicone (MYLICON) 40 HG/9.9ME suspension 80 mg (has no administration in time range)  sodium chloride 0.9 % bolus 1,000 mL (0 mLs Intravenous Stopped 12/08/21 1849)  iohexol (OMNIPAQUE) 300 MG/ML solution 80 mL (80 mLs Intravenous Contrast Given 12/08/21 1647)  sodium chloride 0.9 % bolus 1,000 mL (0 mLs Intravenous Stopped 12/08/21 1849)     IMPRESSION / MDM / ASSESSMENT AND PLAN / ED COURSE  I reviewed the triage vital signs and the nursing notes. Patient's initial troponin is negative.  His EKG is unchanged.  GFR is considerably decreased from this summer in June it was greater than 60 now is 42.   Patient is reporting urinary frequency.  We will get a UA.  Possibly he has some pyelonephritis or he thinks it feels like diverticulitis.  Once he gets the first liter of fluid we will think about CAT scan in him. ----------------------------------------- 6:58 PM on 12/08/2021 ----------------------------------------- Patient now says there is pleuritic pain in his left side of his back at the bottom of his shoulder blade.  Previously had said there was not any pleuritic component to the pain.  D-dimer is borderline positive of course we have done the CT abdomen which was negative.  I am awaiting his second BMP.  This possibly may make [place] have to admit him for AKI.  If that is the case possibly we can do a CT angio of the chest in the morning. ----------------------------------------- 11:40 PM on 12/08/2021 ----------------------------------------- Patient's BNP is better.  He does not want to get more contrast now because he is worried about the effect on his kidneys.  At his age of 51 and the history of recent AKI even though it is resolved I cannot fault him for this.  We will get him in the hospital overnight and reCT him in the morning.  He is going to be on some Lovenox temporarily.  Differential diagnosis includes, but is not limited to, abdominal problem like diverticulitis or ulcer disease was in the initial differential.  Patient later complained of the pain being pleuritic which made me worry about a PE.  His D-dimer is positive.  Patient's presentation is most consistent with acute presentation with potential threat to life or bodily function.  The patient is on the cardiac monitor to evaluate for evidence of arrhythmia and/or significant heart rate changes.  None was seen.    FINAL CLINICAL IMPRESSION(S) / ED DIAGNOSES   Final diagnoses:  Pleuritic chest pain  Positive D-dimer  Pain of upper abdomen  Leukocytosis, unspecified type     Rx / DC Orders   ED Discharge Orders      None        Note:  This document was prepared using Dragon voice recognition software and may include unintentional dictation errors.   Nena Polio, MD 12/08/21 [phone]

## 2021-12-08 NOTE — Assessment & Plan Note (Signed)
Continue aspirin and atorvastatin. ?

## 2021-12-08 NOTE — ED Notes (Signed)
Pt up to toilet. Pt had urinated on self. This RN and EDT Faith attempted to get pt into gown. Pt refused and pt asked if he wanted clean pants. Pt refused and said he does this all the time. Urine sample obtained. Pt given phone to call son. RN Elie Goody informed.

## 2021-12-08 NOTE — Assessment & Plan Note (Signed)
Hold metformin cut back on long-acting insulin to 18 units daily.

## 2021-12-08 NOTE — ED Notes (Signed)
Pt BMP drawn and sent to lab.

## 2021-12-08 NOTE — Assessment & Plan Note (Signed)
Continue amlodipine, lisinopril and metoprolol

## 2021-12-08 NOTE — ED Notes (Signed)
Son at bedside.

## 2021-12-08 NOTE — Assessment & Plan Note (Signed)
Cardiac versus GI CT abdomen and pelvis showed no acute process but did show a hepatic lesion that was incompletely characterized. Please see full report for details Continue to rule out for ACS as above Empiric PPI and as needed simethicone

## 2021-12-08 NOTE — ED Notes (Signed)
Pt given [place] sandwich tray and water. Eating at this time.

## 2021-12-09 ENCOUNTER — Telehealth: Payer: Self-pay | Admitting: Cardiovascular Disease

## 2021-12-09 ENCOUNTER — Other Ambulatory Visit: Payer: Self-pay

## 2021-12-09 ENCOUNTER — Observation Stay: Payer: Medicare Other

## 2021-12-09 DIAGNOSIS — Z8673 Personal history of transient ischemic attack (TIA), and cerebral infarction without residual deficits: Secondary | ICD-10-CM | POA: Diagnosis not present

## 2021-12-09 DIAGNOSIS — Z951 Presence of aortocoronary bypass graft: Secondary | ICD-10-CM

## 2021-12-09 DIAGNOSIS — E1142 Type 2 diabetes mellitus with diabetic polyneuropathy: Secondary | ICD-10-CM | POA: Diagnosis not present

## 2021-12-09 DIAGNOSIS — R109 Unspecified abdominal pain: Secondary | ICD-10-CM

## 2021-12-09 DIAGNOSIS — R0781 Pleurodynia: Secondary | ICD-10-CM | POA: Diagnosis not present

## 2021-12-09 DIAGNOSIS — K7460 Unspecified cirrhosis of liver: Secondary | ICD-10-CM | POA: Diagnosis not present

## 2021-12-09 DIAGNOSIS — R079 Chest pain, unspecified: Secondary | ICD-10-CM | POA: Diagnosis not present

## 2021-12-09 DIAGNOSIS — I10 Essential (primary) hypertension: Secondary | ICD-10-CM

## 2021-12-09 DIAGNOSIS — E785 Hyperlipidemia, unspecified: Secondary | ICD-10-CM

## 2021-12-09 LAB — HEMOGLOBIN A1C
Hgb A1c MFr Bld: 7.3 % — ABNORMAL HIGH (ref 4.8–5.6)
Mean Plasma Glucose: 162.81 mg/dL

## 2021-12-09 LAB — CBG MONITORING, ED
Glucose-Capillary: 223 mg/dL — ABNORMAL HIGH (ref 70–99)
Glucose-Capillary: 138 mg/dL — ABNORMAL HIGH (ref 70–99)

## 2021-12-09 MED ORDER — LEVEMIR FLEXPEN 100 UNIT/ML ~~LOC~~ SOPN: 18.0000 [IU] | PEN_INJECTOR | Freq: Every day | SUBCUTANEOUS | Status: DC

## 2021-12-09 MED ORDER — INSULIN DETEMIR 100 UNIT/ML ~~LOC~~ SOLN
10.0000 [IU] | Freq: Every day | SUBCUTANEOUS | Status: DC
  Filled 2021-12-09 (×2): qty 0.1

## 2021-12-09 MED ORDER — GABAPENTIN 300 MG PO CAPS: 300.0000 mg | ORAL_CAPSULE | Freq: Three times a day (TID) | ORAL | Status: DC

## 2021-12-09 NOTE — Assessment & Plan Note (Signed)
Will need follow-up as outpatient and MRI of the abdomen for liver lesion.

## 2021-12-09 NOTE — Telephone Encounter (Signed)
LMOV to schedule  

## 2021-12-09 NOTE — Discharge Summary (Signed)
Physician Discharge Summary   Patient: Randy Frazier MRN: [id number] DOB: 03/12/1927  Admit date:     12/08/2021  Discharge date: 12/09/21  Discharge Physician: Loletha Grayer   PCP: Pcp, No   Recommendations at discharge:   We will set up with PCP Follow-up with Dr. Rockey Situ We will set up with gastroenterology  Discharge Diagnoses: Principal Problem:   Chest pain Active Problems:   Left flank pain   Cirrhosis ([place])   HYPERTENSION, BENIGN   DM type 2 with diabetic peripheral neuropathy (HCC)   S/P CABG x 3   History of CVA (cerebrovascular accident)    Hospital Course: 88 year old man coming in with chest pain.  ER physician did a CT scan that showed no acute intra abdominal process in the abdomen.  Did comment on a hepatic lesion measuring 3.6 x 2.5 cm which was incompletely characterized on this exam.  A nonemergent MRI of the abdomen can be performed.  Also no findings representing sclerosing mesenteritis unchanged from prior exam seen on the CT scan.  The patient also had a CT scan of the chest which showed no evidence of acute pulmonary embolism, status post CABG, morphological features of the liver concerning for cirrhosis.  The patient was feeling better on the day of discharge and wanted to go home.  Patient was seen by cardiology and okay with following up as outpatient.  Creatinine was 1.56 on presentation down to 1.07 upon discharge.  Troponin 6 and 7 which ruled out heart attack.  White blood cell count 14.6, D-dimer 0.78, hemoglobin A1c 7.3.  Assessment and Plan: * Chest pain CAD s/p CABG Cardiac enzymes are negative this rules out heart attack.  Seen by cardiology and was okay with follow-up as outpatient.  Continue usual medications.  Left flank pain CT abdomen pelvis negative for acute event.  Cirrhosis ([place]) Will need follow-up as outpatient and MRI of the abdomen for liver lesion.  HYPERTENSION, BENIGN Continue amlodipine, lisinopril and  metoprolol  History of CVA (cerebrovascular accident) Continue aspirin and atorvastatin  DM type 2 with diabetic peripheral neuropathy (HCC) Hold metformin cut back on long-acting insulin to 18 units daily.         Consultants: Cardiology Procedures performed: None Disposition: Home Diet recommendation:  Cardiac and Carb modified diet DISCHARGE MEDICATION: Allergies as of 12/09/2021       Reactions   Ibuprofen Swelling   Penicillins Swelling, Hives   lips Other reaction(s): Other (See Comments) Edema (lips)        Medication List     STOP taking these medications    calcipotriene 0.005 % cream Commonly known as: DOVONOX   colchicine 0.6 MG tablet   diphenhydramine-acetaminophen 25-500 MG Tabs tablet Commonly known as: TYLENOL PM   ezetimibe 10 MG tablet Commonly known as: ZETIA   fluorouracil 5 % cream Commonly known as: EFUDEX   lidocaine 2 % jelly Commonly known as: XYLOCAINE   metFORMIN 500 MG 24 hr tablet Commonly known as: GLUCOPHAGE-XR   mirabegron ER 25 MG Tb24 tablet Commonly known as: MYRBETRIQ   pantoprazole 40 MG tablet Commonly known as: PROTONIX   tamsulosin 0.4 MG Caps capsule Commonly known as: FLOMAX       TAKE these medications    amLODipine 10 MG tablet Commonly known as: NORVASC TAKE 1 TABLET BY MOUTH EVERY DAY   aspirin EC 81 MG tablet Take 81 mg by mouth.   atorvastatin 10 MG tablet Commonly known as: LIPITOR Take 1 tablet (10 mg  total) by mouth every other day. What changed: when to take this   clopidogrel 75 MG tablet Commonly known as: PLAVIX TAKE 1 TABLET(75 MG) BY MOUTH DAILY What changed: See the new instructions.   gabapentin 300 MG capsule Commonly known as: NEURONTIN Take 300 mg by mouth 3 (three) times daily.   insulin lispro 100 UNIT/ML KwikPen Commonly known as: HUMALOG 10 Units 2 (two) times daily with a meal.   Levemir FlexPen 100 UNIT/ML FlexPen Generic drug: insulin detemir Inject 18  Units into the skin at bedtime. What changed: See the new instructions.   lisinopril 20 MG tablet Commonly known as: ZESTRIL Take 20 mg by mouth daily.   Magnesium 500 MG Tabs Take 500 mg by mouth daily.   metoprolol tartrate 25 MG tablet Commonly known as: LOPRESSOR Take 25 mg by mouth 2 (two) times daily.        Follow-up Information     Lavera Guise, MD Follow up in 1 week(s).   Specialties: Internal Medicine, Cardiology Contact information: [place] [address] [phone]         Minna Merritts, MD Follow up in 2 week(s).   Specialty: Cardiology Contact information: [address] [place] [address] [phone]         Lesly Rubenstein, MD Follow up.   Specialty: Gastroenterology Why: cirrhosis seen on ct scan Contact information: [address] [place] [address] [phone]                Discharge Exam: Physical Exam HENT:     Head: Normocephalic.     Mouth/Throat:     Pharynx: No oropharyngeal exudate.  Eyes:     General: Lids are normal.     Conjunctiva/sclera: Conjunctivae normal.  Cardiovascular:     Rate and Rhythm: Normal rate and regular rhythm.     Heart sounds: Normal heart sounds, S1 normal and S2 normal.  Pulmonary:     Breath sounds: No decreased breath sounds, wheezing, rhonchi or rales.  Abdominal:     Palpations: Abdomen is soft.     Tenderness: There is no abdominal tenderness.  Musculoskeletal:     Right lower leg: No swelling.     Left lower leg: No swelling.  Skin:    General: Skin is warm.     Findings: Rash is not macular.  Neurological:     Mental Status: He is alert and oriented to person, place, and time.      Condition at discharge: stable  The results of significant diagnostics from this hospitalization (including imaging, microbiology, ancillary and laboratory) are listed below for reference.   Imaging Studies: CT Angio Chest PE W and/or Wo  Contrast  Result Date: 12/09/2021 CLINICAL DATA:  Evaluate for pulmonary embolism. EXAM: CT ANGIOGRAPHY CHEST WITH CONTRAST TECHNIQUE: Multidetector CT imaging of the chest was performed using the standard protocol during bolus administration of intravenous contrast. Multiplanar CT image reconstructions and MIPs were obtained to evaluate the vascular anatomy. RADIATION DOSE REDUCTION: This exam was performed according to the departmental dose-optimization program which includes automated exposure control, adjustment of the mA and/or kV according to patient size and/or use of iterative reconstruction technique. CONTRAST:  1m OMNIPAQUE IOHEXOL 350 MG/ML SOLN COMPARISON:  None Available. FINDINGS: Cardiovascular: Satisfactory opacification of the pulmonary arteries to the segmental level. No evidence of pulmonary embolism. Previous median sternotomy and CABG procedure. Mild cardiac enlargement. No pericardial effusion. Aortic atherosclerotic calcifications. Mediastinum/Nodes: No enlarged  mediastinal, hilar, or axillary lymph nodes. Thyroid gland, trachea, and esophagus demonstrate no significant findings. Lungs/Pleura: Mild subsegmental atelectasis identified within the lung bases. No pleural effusion. No airspace consolidation or signs of interstitial edema. No suspicious pulmonary nodule or mass. Upper Abdomen: The liver has a diffusely nodular contour concerning for cirrhosis. No acute abnormality identified. Musculoskeletal: Spondylosis noted within the thoracic spine. Previous median sternotomy. No acute or suspicious osseous findings. Review of the MIP images confirms the above findings. IMPRESSION: 1. No evidence for acute pulmonary embolism. 2. Status post CABG. 3. Morphologic features of the liver concerning for cirrhosis. 4.  Aortic Atherosclerosis (ICD10-I70.0). Electronically Signed   By: Kerby Moors M.D.   On: 12/09/2021 08:20   CT ABDOMEN PELVIS W CONTRAST  Result Date: 12/08/2021 CLINICAL  DATA:  Abdominal pain EXAM: CT ABDOMEN AND PELVIS WITH CONTRAST TECHNIQUE: Multidetector CT imaging of the abdomen and pelvis was performed using the standard protocol following bolus administration of intravenous contrast. RADIATION DOSE REDUCTION: This exam was performed according to the departmental dose-optimization program which includes automated exposure control, adjustment of the mA and/or kV according to patient size and/or use of iterative reconstruction technique. CONTRAST:  23m OMNIPAQUE IOHEXOL 300 MG/ML  SOLN COMPARISON:  CT abdomen pelvis dated 04/01/2020. FINDINGS: Lower chest: There is mild bibasilar atelectasis. Hepatobiliary: The previously described enhancing lesion in the left hepatic lobe near the dome measures approximately 3.6 x 2.5 cm (series 2, image 15) and is incompletely characterized on this exam. Gallstones are noted. No gallbladder wall thickening or biliary dilatation. Pancreas: Unremarkable. No pancreatic ductal dilatation. Spleen: Normal in size without focal abnormality. Adrenals/Urinary Tract: Adrenal glands are unremarkable. Bilateral renal cysts appear similar to prior exam, measuring up to 9 cm on the right. No imaging follow-up is recommended for this finding. Otherwise, the kidneys are normal, without renal calculi, focal solid lesion, or hydronephrosis. Bladder is unremarkable. Stomach/Bowel: Stomach is within normal limits. Appendix appears normal. There is colonic diverticulosis without evidence of diverticulitis. No evidence of bowel wall thickening, distention, or inflammatory changes. Vascular/Lymphatic: Aortic atherosclerosis. Multiple mesenteric lymph nodes are at the upper limits of normal. 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 Reproductive: The prostate is enlarged, measuring 5.2 cm in transverse dimension. Other: No abdominal  wall hernia or abnormality. No abdominopelvic ascites. Musculoskeletal: Degenerative changes are seen in the spine. IMPRESSION: 1. No acute process in the abdomen or pelvis. 2. Hepatic lesion which is incompletely characterized on this exam. Nonemergent MR abdomen without and with contrast could be performed for further evaluation if clinically indicated. 3. 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 Aortic Atherosclerosis (ICD10-I70.0). Electronically Signed   By: TZerita BoersM.D.   On: 12/08/2021 17:17   DG Chest 2 View  Result Date: 12/08/2021 CLINICAL DATA:  Chest pain. EXAM: CHEST - 2 VIEW COMPARISON:  05/10/2016 FINDINGS: Prior median sternotomy and CABG.The cardiomediastinal contours are normal. Subsegmental atelectasis or scar at the left lung base. Pulmonary vasculature is normal. No consolidation, pleural effusion, or pneumothorax. No acute osseous abnormalities are seen. IMPRESSION: Subsegmental atelectasis or scar at the left lung base. Otherwise no acute abnormality. Electronically Signed   By: MKeith RakeM.D.   On: 12/08/2021 14:14    Microbiology: Results for orders placed or performed during the hospital encounter of 03/30/20  Resp Panel by RT-PCR (Flu A&B, Covid) Nasopharyngeal Swab     Status: None  Collection Time: 03/30/20  9:49 AM   Specimen: Nasopharyngeal Swab; Nasopharyngeal(NP) swabs in vial transport medium  Result Value Ref Range Status   SARS Coronavirus 2 by RT PCR NEGATIVE NEGATIVE Final    Comment: (NOTE) SARS-CoV-2 target nucleic acids are NOT DETECTED.  The SARS-CoV-2 RNA is generally detectable in upper respiratory specimens during the acute phase of infection. The lowest concentration of SARS-CoV-2 viral copies this assay can detect is 138 copies/mL. A negative result does not preclude SARS-Cov-2 infection and should not be used as the sole basis for treatment or other patient management decisions. A negative  result may occur with  improper specimen collection/handling, submission of specimen other than nasopharyngeal swab, presence of viral mutation(s) within the areas targeted by this assay, and inadequate number of viral copies(<138 copies/mL). A negative result must be combined with clinical observations, patient history, and epidemiological information. The expected result is Negative.  Fact Sheet for Patients:  EntrepreneurPulse.com.au  Fact Sheet for Healthcare Providers:  IncredibleEmployment.be  This test is no t yet approved or cleared by the [place] FDA and  has been authorized for detection and/or diagnosis of SARS-CoV-2 by FDA under an Emergency Use Authorization (EUA). This EUA will remain  in effect (meaning this test can be used) for the duration of the COVID-19 declaration under Section 564(b)(1) of the Act, 21 U.S.C.section 360bbb-3(b)(1), unless the authorization is terminated  or revoked sooner.       Influenza A by PCR NEGATIVE NEGATIVE Final   Influenza B by PCR NEGATIVE NEGATIVE Final    Comment: (NOTE) The Xpert Xpress SARS-CoV-2/FLU/RSV plus assay is intended as an aid in the diagnosis of influenza from Nasopharyngeal swab specimens and should not be used as a sole basis for treatment. Nasal washings and aspirates are unacceptable for Xpert Xpress SARS-CoV-2/FLU/RSV testing.  Fact Sheet for Patients: EntrepreneurPulse.com.au  Fact Sheet for Healthcare Providers: IncredibleEmployment.be  This test is not yet approved or cleared by the [place] FDA and has been authorized for detection and/or diagnosis of SARS-CoV-2 by FDA under an Emergency Use Authorization (EUA). This EUA will remain in effect (meaning this test can be used) for the duration of the COVID-19 declaration under Section 564(b)(1) of the Act, 21 U.S.C. section 360bbb-3(b)(1), unless the authorization is  terminated or revoked.  Performed at [facility], [place] [place],  [id number]     Labs: CBC: Recent Labs  Lab 12/08/21 1357  WBC 14.6*  HGB 13.2  HCT 41.3  MCV 86.4  PLT 646   Basic Metabolic Panel: Recent Labs  Lab 12/08/21 1357 12/08/21 1840  NA 134* 135  K 4.6 4.2  CL 99 106  CO2 23 23  GLUCOSE 185* 121*  BUN 28* 27*  CREATININE 1.56* 1.07  CALCIUM 9.6 8.2*    CBG: Recent Labs  Lab 12/08/21 2057 12/09/21 0121 12/09/21 0751  GLUCAP 134* 223* 138*    Discharge time spent: greater than 30 minutes.  Signed: Loletha Grayer, MD [facility] 12/09/2021

## 2021-12-09 NOTE — Consult Note (Signed)
Cardiology Consultation   Patient ID: Randy Frazier MRN: [id number]; DOB: 03/22/1927  Admit date: 12/08/2021 Date of Consult: 12/09/2021  PCP:  Kathyrn Lass   [facility] Cardiologist:  Ida Rogue, MD        Patient Profile:   Randy Frazier is a 88 y.o. male with a hx of hypertension, hyperlipidemia, diabetes, coronary artery disease status post CABG, and CVA who is being seen 12/09/2021 for the evaluation of abdominal pain radiating into the left flank and back at the request of Dr. Damita Dunnings.  History of Present Illness:   Randy Frazier is a 88 year old male with an extensive past medical history of hypertension, hyperlipidemia, diabetes, coronary artery disease status post CABG in 2009 and CVA.   He was last seen in clinic by Dr. Rockey Situ on 12/05/2019.  At that time he was stable from the cardiac standpoint without any complaints or concerns or any required testing.  He presented to the [facility] emergency department on 12/08/2021 with a 2-day history of lower left chest pain and upper abdominal pain that radiating to the left flank and back.  At that time he also had associated shortness of breath.  The pain was nonexertional worsened with deep breaths.  He had denied nausea vomiting cough, fever or chills, or change in bowel habits.  Also noted that his blood pressures have been elevated at home for several days.  Initial vital signs blood pressure 132/84, pulse of 76, respirations of 18, temperature of 97.6.  Pertinent labs: Sodium 134, blood glucose 185, BUN 28, serum creatinine 1.56, with a GFR 42, WBCs of 14.6, D-dimer 0.78, sensitivity troponin trended 6 and 7  Medications given in the emergency department metoprolol tartrate 25 mg, aspirin 324 mg, Lovenox 80 mg subcu, and 2 units of insulin, 1 L of IV fluid  Imaging: Chest x-ray revealed subsegmental atelectasis or scar at the left lung base, otherwise no acute abnormality; CT of the abdomen and  pelvis revealed no acute process in the abdomen or pelvis, hepatic lesion which is incompletely characterized on this exam, nonemergent MRI abdomen without and with contrast would be preferred for further evaluation if clinically indicated, 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  Was also advised that he may have a blood clot which he was informed that the CTA of his chest showed no pulmonary embolism.  Echocardiogram was scheduled.   Past Medical History:  Diagnosis Date   Actinic keratosis    Arrhythmia    A-Fib   BPH (benign prostatic hyperplasia)    Coronary artery disease 02/11/2007   CABG X 3  @ [facility]   CVA (cerebral infarction) 02/11/2012   Diabetes mellitus without complication (HCC)    Enlarged prostate    GERD (gastroesophageal reflux disease)    Hyperlipidemia    Hypertension    Kidney stone    Renal disorder    Skin cancer    scalp, tx by Dr Sharlett Iles 2000s?    Past Surgical History:  Procedure Laterality Date   cataract surgery Bilateral    CORONARY ARTERY BYPASS GRAFT  2009   CABG X 3 @ [facility]   TEAR  DUCT PROBING Right 05/07/2016   Procedure: Evacuation of hematoma, right peri-orbital bleed.;  Surgeon: Clista Bernhardt, MD;  Location: [place];  Service: Ophthalmology;  Laterality: Right;     Home Medications:  Prior to Admission medications   Medication Sig Start Date End Date Taking? Authorizing Provider  amLODipine (NORVASC) 10 MG tablet TAKE 1 TABLET BY MOUTH EVERY DAY Patient taking differently: Take 10 mg by mouth daily. 10/03/12  Yes Minna Merritts, MD  aspirin EC 81 MG tablet Take 81 mg by mouth.   Yes [provider]  atorvastatin (LIPITOR) 10 MG tablet Take 1 tablet (10 mg total) by mouth every other day. Patient taking differently: Take 10 mg by mouth daily. 12/05/19  Yes Gollan, Kathlene November, MD  clopidogrel (PLAVIX) 75 MG tablet TAKE 1 TABLET(75 MG) BY MOUTH DAILY Patient taking differently: Take 75 mg by mouth daily. 08/20/17  Yes Gollan, Kathlene November, MD  diphenhydramine-acetaminophen (TYLENOL PM) 25-500 MG TABS tablet Take 2 tablets by mouth at bedtime as needed.   Yes [provider]  gabapentin (NEURONTIN) 300 MG capsule Take 300 mg by mouth 3 (three) times daily.  01/24/12  Yes [provider]  insulin lispro (HUMALOG) 100 UNIT/ML KwikPen 10 Units 2 (two) times daily with a meal. 04/17/21  Yes [provider]  LEVEMIR FLEXPEN 100 UNIT/ML FlexPen SMARTSIG:46 Unit(s) SUB-Q Every Night 05/14/21  Yes [provider]  lidocaine (XYLOCAINE) 2 % jelly Place 1 Application into the urethra as needed. 07/26/21  Yes Blake Divine, MD  lidocaine (XYLOCAINE) 2 % jelly Place 1 Application into the urethra as needed. Every 4 hours as needed 07/27/21  Yes Poggi, Jenna E, PA-C  lisinopril (PRINIVIL,ZESTRIL) 20 MG tablet Take 20 mg by mouth daily.  01/27/13  Yes [provider]  Magnesium 500 MG TABS Take 500 mg by mouth daily.   Yes [provider]  metFORMIN (GLUCOPHAGE-XR) 500 MG 24 hr tablet Take 500 mg by mouth 2 (two) times daily with a meal.   Yes [provider]  metoprolol tartrate (LOPRESSOR) 25 MG tablet Take 25 mg by mouth 2 (two) times daily.   Yes [provider]  calcipotriene (DOVONOX) 0.005 % cream Apply to scalp twice a day x 10-14 days. Patient not taking: Reported on 12/08/2021 05/14/21   Brendolyn Patty, MD  colchicine 0.6 MG tablet Take 1 tablet (0.6 mg total) by mouth daily for 1 dose. 07/26/21 07/27/21  Lucrezia Starch, MD  ezetimibe (ZETIA) 10 MG tablet  Take 1 tablet (10 mg total) by mouth daily. Patient not taking: Reported on 12/08/2021 12/05/19   Minna Merritts, MD  fluorouracil (EFUDEX) 5 % cream Apply pea sized amount to scalp twice a day x 10-14 days. Patient not taking: Reported on 12/08/2021 05/23/21   Brendolyn Patty, MD  mirabegron ER (MYRBETRIQ) 25 MG TB24 tablet Take 1 tablet (25 mg total) by mouth daily. Patient not taking: Reported on 12/08/2021 07/25/21   Billey Co, MD  pantoprazole (PROTONIX) 40 MG tablet Take 40 mg by mouth daily. Patient not taking: Reported on 12/08/2021    [provider]  tamsulosin (FLOMAX) 0.4 MG CAPS capsule Take 0.4 mg by mouth every evening. Patient not taking: Reported on 12/08/2021    [provider]    Inpatient Medications: Scheduled Meds:  amLODipine  10 mg Oral Daily   aspirin EC  81 mg Oral Daily   atorvastatin  10 mg Oral Daily   clopidogrel  75  mg Oral Daily   enoxaparin (LOVENOX) injection  40 mg Subcutaneous Q24H   insulin aspart  0-15 Units Subcutaneous TID WC   insulin aspart  0-5 Units Subcutaneous QHS   insulin detemir  10 Units Subcutaneous QHS   metoprolol tartrate  25 mg Oral BID   pantoprazole  40 mg Oral Daily   Continuous Infusions:  PRN Meds: acetaminophen, nitroGLYCERIN, ondansetron (ZOFRAN) IV, simethicone  Allergies:    Allergies  Allergen Reactions   Ibuprofen Swelling   Penicillins Swelling and Hives    lips Other reaction(s): Other (See Comments) Edema (lips)    Social History:   Social History   Socioeconomic History   Marital status: Widowed    Spouse name: Not on file   Number of children: Not on file   Years of education: Not on file   Highest education level: Not on file  Occupational History   Not on file  Tobacco Use   Smoking status: Never   Smokeless tobacco: Never  Vaping Use   Vaping Use: Never used  Substance and Sexual Activity   Alcohol use: No    Comment: occasional   Drug use: No   Sexual  activity: Not Currently  Other Topics Concern   Not on file  Social History Narrative   Not on file   Social Determinants of Health   Financial Resource Strain: Not on file  Food Insecurity: Not on file  Transportation Needs: Not on file  Physical Activity: Not on file  Stress: Not on file  Social Connections: Not on file  Intimate Partner Violence: Not on file    Family History:    Family History  Family history unknown: Yes     ROS:  Please see the history of present illness.  Review of Systems  Gastrointestinal:  Positive for abdominal pain.    All other ROS reviewed and negative.     Physical Exam/Data:   Vitals:   12/09/21 0100 12/09/21 0130 12/09/21 0304 12/09/21 0510  BP: (!) 172/94 (!) 173/83 (!) 140/76 (!) 143/71  Pulse: 74 72 71 68  Resp:  '19 18 17  '$ Temp:   98 F (36.7 C) 98.1 F (36.7 C)  TempSrc:   Oral Oral  SpO2: 95% 95% 93% 97%    Intake/Output Summary (Last 24 hours) at 12/09/2021 0915 Last data filed at 12/08/2021 2031 Gross per 24 hour  Intake 2000 ml  Output 600 ml  Net 1400 ml      07/26/2021    7:05 PM 07/26/2021   10:26 AM 07/26/2021    6:10 AM  Last 3 Weights  Weight (lbs) 184 lb 15.5 oz 185 lb 185 lb  Weight (kg) 83.9 kg 83.915 kg 83.915 kg     There is no height or weight on file to calculate BMI.  General:  Well nourished, well developed, in no acute distress HEENT: normal Neck: no JVD Vascular: No carotid bruits; Distal pulses 2+ bilaterally Cardiac:  normal S1, S2; RRR; no murmur  Lungs:  clear to auscultation bilaterally, no wheezing, rhonchi or rales, respirations are unlabored at rest on room air Abd: soft, nontender, no hepatomegaly  Ext: no edema Musculoskeletal:  No deformities, BUE and BLE strength normal and equal Skin: warm and dry  Neuro:  CNs 2-12 intact, no focal abnormalities noted Psych:  Normal affect   EKG:  The EKG was personally reviewed and demonstrates: Revealed sinus rhythm with a rate of 78 with a  left anterior fascicular block and  a right bundle branch block Telemetry:  Telemetry was personally reviewed and demonstrates: Sinus rhythm with a rate in the 70s  Relevant CV Studies: Echocardiogram was ordered and pending  Laboratory Data:  High Sensitivity Troponin:   Recent Labs  Lab 12/08/21 1357 12/08/21 1607  TROPONINIHS 6 7     Chemistry Recent Labs  Lab 12/08/21 1357 12/08/21 1840  NA 134* 135  K 4.6 4.2  CL 99 106  CO2 23 23  GLUCOSE 185* 121*  BUN 28* 27*  CREATININE 1.56* 1.07  CALCIUM 9.6 8.2*  GFRNONAA 42* >60  ANIONGAP 12 6    No results for input(s): "PROT", "ALBUMIN", "AST", "ALT", "ALKPHOS", "BILITOT" in the last 168 hours. Lipids No results for input(s): "CHOL", "TRIG", "HDL", "LABVLDL", "LDLCALC", "CHOLHDL" in the last 168 hours.  Hematology Recent Labs  Lab 12/08/21 1357  WBC 14.6*  RBC 4.78  HGB 13.2  HCT 41.3  MCV 86.4  MCH 27.6  MCHC 32.0  RDW 19.6*  PLT 165   Thyroid No results for input(s): "TSH", "FREET4" in the last 168 hours.  BNPNo results for input(s): "BNP", "PROBNP" in the last 168 hours.  DDimer  Recent Labs  Lab 12/08/21 1607  DDIMER 0.78*     Radiology/Studies:  CT Angio Chest PE W and/or Wo Contrast  Result Date: 12/09/2021 CLINICAL DATA:  Evaluate for pulmonary embolism. EXAM: CT ANGIOGRAPHY CHEST WITH CONTRAST TECHNIQUE: Multidetector CT imaging of the chest was performed using the standard protocol during bolus administration of intravenous contrast. Multiplanar CT image reconstructions and MIPs were obtained to evaluate the vascular anatomy. RADIATION DOSE REDUCTION: This exam was performed according to the departmental dose-optimization program which includes automated exposure control, adjustment of the mA and/or kV according to patient size and/or use of iterative reconstruction technique. CONTRAST:  71m OMNIPAQUE IOHEXOL 350 MG/ML SOLN COMPARISON:  None Available. FINDINGS: Cardiovascular: Satisfactory  opacification of the pulmonary arteries to the segmental level. No evidence of pulmonary embolism. Previous median sternotomy and CABG procedure. Mild cardiac enlargement. No pericardial effusion. Aortic atherosclerotic calcifications. Mediastinum/Nodes: No enlarged mediastinal, hilar, or axillary lymph nodes. Thyroid gland, trachea, and esophagus demonstrate no significant findings. Lungs/Pleura: Mild subsegmental atelectasis identified within the lung bases. No pleural effusion. No airspace consolidation or signs of interstitial edema. No suspicious pulmonary nodule or mass. Upper Abdomen: The liver has a diffusely nodular contour concerning for cirrhosis. No acute abnormality identified. Musculoskeletal: Spondylosis noted within the thoracic spine. Previous median sternotomy. No acute or suspicious osseous findings. Review of the MIP images confirms the above findings. IMPRESSION: 1. No evidence for acute pulmonary embolism. 2. Status post CABG. 3. Morphologic features of the liver concerning for cirrhosis. 4.  Aortic Atherosclerosis (ICD10-I70.0). Electronically Signed   By: TKerby MoorsM.D.   On: 12/09/2021 08:20   CT ABDOMEN PELVIS W CONTRAST  Result Date: 12/08/2021 CLINICAL DATA:  Abdominal pain EXAM: CT ABDOMEN AND PELVIS WITH CONTRAST TECHNIQUE: Multidetector CT imaging of the abdomen and pelvis was performed using the standard protocol following bolus administration of intravenous contrast. RADIATION DOSE REDUCTION: This exam was performed according to the departmental dose-optimization program which includes automated exposure control, adjustment of the mA and/or kV according to patient size and/or use of iterative reconstruction technique. CONTRAST:  888mOMNIPAQUE IOHEXOL 300 MG/ML  SOLN COMPARISON:  CT abdomen pelvis dated 04/01/2020. FINDINGS: Lower chest: There is mild bibasilar atelectasis. Hepatobiliary: The previously described enhancing lesion in the left hepatic lobe near the dome  measures approximately 3.6 x 2.5  cm (series 2, image 15) and is incompletely characterized on this exam. Gallstones are noted. No gallbladder wall thickening or biliary dilatation. Pancreas: Unremarkable. No pancreatic ductal dilatation. Spleen: Normal in size without focal abnormality. Adrenals/Urinary Tract: Adrenal glands are unremarkable. Bilateral renal cysts appear similar to prior exam, measuring up to 9 cm on the right. No imaging follow-up is recommended for this finding. Otherwise, the kidneys are normal, without renal calculi, focal solid lesion, or hydronephrosis. Bladder is unremarkable. Stomach/Bowel: Stomach is within normal limits. Appendix appears normal. There is colonic diverticulosis without evidence of diverticulitis. No evidence of bowel wall thickening, distention, or inflammatory changes. Vascular/Lymphatic: Aortic atherosclerosis. Multiple mesenteric lymph nodes are at the upper limits of normal. 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 Reproductive: The prostate is enlarged, measuring 5.2 cm in transverse dimension. Other: No abdominal wall hernia or abnormality. No abdominopelvic ascites. Musculoskeletal: Degenerative changes are seen in the spine. IMPRESSION: 1. No acute process in the abdomen or pelvis. 2. Hepatic lesion which is incompletely characterized on this exam. Nonemergent MR abdomen without and with contrast could be performed for further evaluation if clinically indicated. 3. 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 Aortic Atherosclerosis (ICD10-I70.0). Electronically Signed   By: Zerita Boers M.D.   On: 12/08/2021 17:17   DG Chest 2 View  Result Date: 12/08/2021 CLINICAL DATA:  Chest pain. EXAM: CHEST - 2 VIEW COMPARISON:  05/10/2016 FINDINGS: Prior median sternotomy and CABG.The  cardiomediastinal contours are normal. Subsegmental atelectasis or scar at the left lung base. Pulmonary vasculature is normal. No consolidation, pleural effusion, or pneumothorax. No acute osseous abnormalities are seen. IMPRESSION: Subsegmental atelectasis or scar at the left lung base. Otherwise no acute abnormality. Electronically Signed   By: Keith Rake M.D.   On: 12/08/2021 14:14     Assessment and Plan:   Atypical chest pain with a history of coronary artery disease status post CABG -High-sensitivity troponins trended 6 and 7 -Chest pain-free on exam -No ischemic changes noted on EKG -CTA of the chest negative for PE -Recommend restarting home lisinopril and ezetimibe -Continue aspirin, atorvastatin, clopidogrel, amlodipine, and metoprolol tartrate -Echocardiogram ordered and pending -We will need follow-up appointment with primary cardiologist Dr. Rockey Situ on discharge to reevaluate symptoms -Continue cardiac monitoring while inpatient -EKG as needed for changes in pain -Continue as needed morphine and Nitrostat if needed for chest discomfort  History of CVA -No residual deficits -Continued on aspirin and clopidogrel  Hypertension -blood pressure of 143/71 -Continue on amlodipine and metoprolol titrate -Vital signs per unit protocol  Hyperlipidemia -LDL 77 on 06/20/2020 -continued on atorvastatin 10 mg daily  Type 2 diabetes with diabetic peripheral neuropathy -He has been continued on basal insulin but reduced dosing -continue with sliding scale -Metformin has been held during hospitalization -Given gabapentin for his neuropathy -Management per IM    Risk Assessment/Risk Scores:                For questions or updates, please contact [place] Please consult [email or web address] for contact info under    Signed, Macallan Ord, NP  12/09/2021 9:15 AM

## 2021-12-09 NOTE — Telephone Encounter (Signed)
-----   Message from Gerrie Nordmann, NP sent at 12/09/2021  1:07 PM EDT ----- Regarding: Hospital follow up Please schedule hospital follow up appointment with Dr Rockey Situ or APP in 2-3 weeks Thanks

## 2021-12-09 NOTE — Telephone Encounter (Signed)
Made in error

## 2021-12-10 LAB — LIPOPROTEIN A (LPA): Lipoprotein (a): 82.9 nmol/L — ABNORMAL HIGH (ref <75.0)

## 2021-12-11 ENCOUNTER — Other Ambulatory Visit: Payer: Self-pay | Admitting: Gastroenterology

## 2021-12-11 DIAGNOSIS — K769 Liver disease, unspecified: Secondary | ICD-10-CM

## 2021-12-14 ENCOUNTER — Other Ambulatory Visit: Payer: Self-pay

## 2021-12-14 ENCOUNTER — Emergency Department
Admission: EM | Admit: 2021-12-14 | Discharge: 2021-12-14 | Disposition: A | Discharge status: Home / Self Care | Payer: Medicare Other | Attending: Emergency Medicine | Admitting: Emergency Medicine

## 2021-12-14 ENCOUNTER — Emergency Department: Payer: Medicare Other

## 2021-12-14 ENCOUNTER — Encounter: Payer: Self-pay | Admitting: Emergency Medicine

## 2021-12-14 DIAGNOSIS — R1013 Epigastric pain: Secondary | ICD-10-CM | POA: Insufficient documentation

## 2021-12-14 DIAGNOSIS — R110 Nausea: Secondary | ICD-10-CM | POA: Insufficient documentation

## 2021-12-14 DIAGNOSIS — I10 Essential (primary) hypertension: Secondary | ICD-10-CM | POA: Insufficient documentation

## 2021-12-14 DIAGNOSIS — E119 Type 2 diabetes mellitus without complications: Secondary | ICD-10-CM | POA: Diagnosis not present

## 2021-12-14 DIAGNOSIS — I2510 Atherosclerotic heart disease of native coronary artery without angina pectoris: Secondary | ICD-10-CM | POA: Diagnosis not present

## 2021-12-14 LAB — CBC
HCT: 39.1 % (ref 39.0–52.0)
Hemoglobin: 13.3 g/dL (ref 13.0–17.0)
MCH: 28.7 pg (ref 26.0–34.0)
MCHC: 34 g/dL (ref 30.0–36.0)
MCV: 84.3 fL (ref 80.0–100.0)
Platelets: 176 10*3/uL (ref 150–400)
RBC: 4.64 MIL/uL (ref 4.22–5.81)
RDW: 19.1 % — ABNORMAL HIGH (ref 11.5–15.5)
WBC: 15.2 10*3/uL — ABNORMAL HIGH (ref 4.0–10.5)
nRBC: 0 % (ref 0.0–0.2)

## 2021-12-14 LAB — COMPREHENSIVE METABOLIC PANEL
ALT: 50 U/L — ABNORMAL HIGH (ref 0–44)
AST: 55 U/L — ABNORMAL HIGH (ref 15–41)
Albumin: 4 g/dL (ref 3.5–5.0)
Alkaline Phosphatase: 130 U/L — ABNORMAL HIGH (ref 38–126)
Anion gap: 10 (ref 5–15)
BUN: 26 mg/dL — ABNORMAL HIGH (ref 8–23)
CO2: 25 mmol/L (ref 22–32)
Calcium: 9.6 mg/dL (ref 8.9–10.3)
Chloride: 97 mmol/L — ABNORMAL LOW (ref 98–111)
Creatinine, Ser: 1.49 mg/dL — ABNORMAL HIGH (ref 0.61–1.24)
GFR, Estimated: 44 mL/min — ABNORMAL LOW (ref >60)
Glucose, Bld: 156 mg/dL — ABNORMAL HIGH (ref 70–99)
Potassium: 4.6 mmol/L (ref 3.5–5.1)
Sodium: 132 mmol/L — ABNORMAL LOW (ref 135–145)
Total Bilirubin: 1.2 mg/dL (ref 0.3–1.2)
Total Protein: 8.9 g/dL — ABNORMAL HIGH (ref 6.5–8.1)

## 2021-12-14 LAB — LIPASE, BLOOD: Lipase: 30 U/L (ref 11–51)

## 2021-12-14 LAB — TROPONIN I (HIGH SENSITIVITY): Troponin I (High Sensitivity): 9 ng/L (ref <18)

## 2021-12-14 LAB — CBG MONITORING, ED: Glucose-Capillary: 155 mg/dL — ABNORMAL HIGH (ref 70–99)

## 2021-12-14 MED ORDER — LIDOCAINE VISCOUS HCL 2 % MT SOLN
15.0000 mL | Freq: Once | OROMUCOSAL | Status: AC
  Administered 2021-12-14: 15 mL via OROMUCOSAL
  Filled 2021-12-14: qty 15

## 2021-12-14 MED ORDER — ALUM & MAG HYDROXIDE-SIMETH 200-200-20 MG/5ML PO SUSP
30.0000 mL | Freq: Once | ORAL | Status: AC
  Administered 2021-12-14: 30 mL via ORAL
  Filled 2021-12-14: qty 30

## 2021-12-14 NOTE — Discharge Instructions (Addendum)
Your testing today did not show any problems with your pancreas.  We did an ultrasound which again demonstrated lesions in your liver, you do need to have an MRI of your liver to further evaluate this

## 2021-12-14 NOTE — ED Triage Notes (Signed)
Pt via POV from [facility]. Pt was sent over for epigastric pain for the past couple of weeks, states he hasn't eaten because he was nauseous. Pt has a hx of a fib. Pt is A&OX4 and NAD

## 2021-12-14 NOTE — ED Provider Notes (Signed)
[facility] Provider Note    Event Date/Time   First MD Initiated Contact with Patient 12/14/21 1113     (approximate)   History   Abdominal Pain   HPI  Randy Frazier is a 88 y.o. male with a history of CAD, diabetes, CVA, hypertension who presents with complaints of abdominal pain and nausea with decreased p.o. intake.  Patient reports since Tuesday he had a sandwich from a fast food store since then he has had significant nausea and epigastric discomfort.  Denies diarrhea.  Normal stools.  No history of peptic ulcer disease.  No chest pain.  No shortness of breath.  Was recently evaluated here for high blood pressure had CT scan of the chest abdomen pelvis which did not show any acute abnormalities     Physical Exam   Triage Vital Signs: ED Triage Vitals  Enc Vitals Group     BP 12/14/21 1116 (!) 164/77     Pulse Rate 12/14/21 1116 87     Resp 12/14/21 1116 18     Temp 12/14/21 1116 97.7 F (36.5 C)     Temp Source 12/14/21 1116 Oral     SpO2 12/14/21 1116 97 %     Weight 12/14/21 1109 83.9 kg (185 lb)     Height 12/14/21 1109 1.829 m (6')     Head Circumference --      Peak Flow --      Pain Score 12/14/21 1109 7     Pain Loc --      Pain Edu? --      Excl. in [place]? --     Most recent vital signs: Vitals:   12/14/21 1116  BP: (!) 164/77  Pulse: 87  Resp: 18  Temp: 97.7 F (36.5 C)  SpO2: 97%     General: Awake, no distress.  CV:  Good peripheral perfusion.  Regular rate and rhythm Resp:  Normal effort.  Abd:  No distention.  Mild tenderness in the epigastrium, no right upper quadrant tenderness, no lower abdominal tenderness. Other:     ED Results / Procedures / Treatments   Labs (all labs ordered are listed, but only abnormal results are displayed) Labs Reviewed  CBC - Abnormal; Notable for the following components:      Result Value   WBC 15.2 (*)    RDW 19.1 (*)    All other components within normal limits   COMPREHENSIVE METABOLIC PANEL - Abnormal; Notable for the following components:   Sodium 132 (*)    Chloride 97 (*)    Glucose, Bld 156 (*)    BUN 26 (*)    Creatinine, Ser 1.49 (*)    Total Protein 8.9 (*)    AST 55 (*)    ALT 50 (*)    Alkaline Phosphatase 130 (*)    GFR, Estimated 44 (*)    All other components within normal limits  CBG MONITORING, ED - Abnormal; Notable for the following components:   Glucose-Capillary 155 (*)    All other components within normal limits  LIPASE, BLOOD  TROPONIN I (HIGH SENSITIVITY)     EKG  ED ECG REPORT I, Lavonia Drafts, the attending physician, personally viewed and interpreted this ECG.  Date: 12/14/2021  Rhythm: normal sinus rhythm QRS Axis: Abnormal Intervals: Left bundle branch block ST/T Wave abnormalities: normal Narrative Interpretation: no evidence of acute ischemia    RADIOLOGY  Ultrasound viewed interpreted by me, no evidence of cholecystitis  PROCEDURES:  Critical Care performed:   Procedures   MEDICATIONS ORDERED IN ED: Medications  alum & mag hydroxide-simeth (MAALOX/MYLANTA) 200-200-20 MG/5ML suspension 30 mL (30 mLs Oral Given 12/14/21 1143)  lidocaine (XYLOCAINE) 2 % viscous mouth solution 15 mL (15 mLs Mouth/Throat Given 12/14/21 1143)     IMPRESSION / MDM / ASSESSMENT AND PLAN / ED COURSE  I reviewed the triage vital signs and the nursing notes. Patient's presentation is most consistent with acute presentation with potential threat to life or bodily function.  Patient presents with epigastric abdominal pain as noted above.  Differential includes gastritis, pancreatitis, esophagitis, less likely ACS  EKG not significantly changed from prior, will obtain single high sensitive troponin  Pending lipase CMP CBC, will trial GI cocktail as well.  Lipase is normal, CMP with mild elevation of alk phos mild elevation of white blood cell count which is nonspecific.  Patient felt much better after GI  cocktail became anxious to leave.  Convinced him to stay well ultrasound results were pending.  Ultrasound again shows lesions in the liver, he states he has follow-up with his doctor for MRI of the liver  Patient is pacing outside of his room and wants to leave immediately.  He seems to be feeling much better, appropriate for discharge, no indication for admission at this time      FINAL CLINICAL IMPRESSION(S) / ED DIAGNOSES   Final diagnoses:  Epigastric pain     Rx / DC Orders   ED Discharge Orders     None        Note:  This document was prepared using Dragon voice recognition software and may include unintentional dictation errors.   Lavonia Drafts, MD 12/14/21 [phone]

## 2021-12-19 ENCOUNTER — Ambulatory Visit
Admission: RE | Admit: 2021-12-19 | Discharge: 2021-12-19 | Disposition: A | Discharge status: Home / Self Care | Payer: Medicare Other | Source: Ambulatory Visit | Attending: Gastroenterology | Admitting: Gastroenterology

## 2021-12-19 DIAGNOSIS — K769 Liver disease, unspecified: Secondary | ICD-10-CM | POA: Diagnosis present

## 2021-12-19 MED ORDER — GADOBUTROL 1 MMOL/ML IV SOLN
8.0000 mL | Freq: Once | INTRAVENOUS | Status: AC | PRN
  Administered 2021-12-19: 8 mL via INTRAVENOUS

## 2021-12-30 ENCOUNTER — Telehealth: Payer: Self-pay

## 2021-12-30 ENCOUNTER — Ambulatory Visit: Payer: Medicare Other | Admitting: Cardiology

## 2021-12-30 NOTE — Telephone Encounter (Signed)
Spoke with patient regarding new appointment, explained details. All questions answered. Patient verbalizes understanding of appoiment tomorrow

## 2021-12-31 ENCOUNTER — Encounter: Payer: Self-pay | Admitting: Oncology

## 2021-12-31 ENCOUNTER — Inpatient Hospital Stay: Payer: Medicare Other

## 2021-12-31 ENCOUNTER — Inpatient Hospital Stay: Payer: Medicare Other | Attending: Oncology | Admitting: Oncology

## 2021-12-31 VITALS — BP 146/66 | HR 73 | Temp 97.8°F | Wt 199.0 lb

## 2021-12-31 DIAGNOSIS — I10 Essential (primary) hypertension: Secondary | ICD-10-CM | POA: Insufficient documentation

## 2021-12-31 DIAGNOSIS — R932 Abnormal findings on diagnostic imaging of liver and biliary tract: Secondary | ICD-10-CM | POA: Diagnosis not present

## 2021-12-31 DIAGNOSIS — R160 Hepatomegaly, not elsewhere classified: Secondary | ICD-10-CM

## 2021-12-31 DIAGNOSIS — Z8507 Personal history of malignant neoplasm of pancreas: Secondary | ICD-10-CM | POA: Diagnosis not present

## 2021-12-31 DIAGNOSIS — E1142 Type 2 diabetes mellitus with diabetic polyneuropathy: Secondary | ICD-10-CM | POA: Diagnosis not present

## 2021-12-31 DIAGNOSIS — K7469 Other cirrhosis of liver: Secondary | ICD-10-CM

## 2021-12-31 DIAGNOSIS — K7460 Unspecified cirrhosis of liver: Secondary | ICD-10-CM | POA: Diagnosis not present

## 2021-12-31 LAB — PROTIME-INR
INR: 1.1 (ref 0.8–1.2)
Prothrombin Time: 14.4 seconds (ref 11.4–15.2)

## 2021-12-31 NOTE — Progress Notes (Signed)
Pt is very nervous for todays visit.

## 2021-12-31 NOTE — Progress Notes (Signed)
Hematology/Oncology Consult note [facility] [phone] [phone]  Patient Care Team: Pcp, No as PCP - General Rockey Situ Kathlene November, MD as PCP - Cardiology (Cardiology) Minna Merritts, MD as Consulting Physician (Cardiology) Clent Jacks, RN as Oncology Nurse Navigator   Name of the patient: Randy Frazier  [id number]  [birth year]    Reason for referral- liver masses   Referring physician- Dr. Haig Prophet  Date of visit: 12/31/21   History of presenting illness- Patient is a 88 year old male who was seen by GI after he had a CT abdomen in October 2023 for abdominal pain.  CT showed enhancing lesion in the left hepatic lobe measuring 3.6 x 2.5 cm.  This was followed by an MRI abdomen with and without contrast which shows a dominant mass measuring 6.1 x 3.8 x 3.5 cm in the segment 2 of the liver.  Multiple other foci of hyperenhancement up to 1.3 cm which were categorized as LI-RADS 4.  Other lesions which were small but are also suspicious for multifocal hepatocellular carcinoma LI-RADS 3.  The left portal vein was likely involved with the dominant high left hepatic lobe tumor.  Patient referred to oncology for further management.[name]  He was found to have changes of cirrhosis as well on the scan although the cause of cirrhosis is presently unclear.  He has undergone some work-up by GI including hepatitis testing which was negative.  Iron studies are normal.  He is a lifelong teetotaler.  He is doing well for his age and is independent of his ADLs and IADLs.  Denies any changes in his appetite or weight.  ECOG PS- 1  Pain scale- 0   Review of systems- Review of Systems  Constitutional:  Positive for malaise/fatigue. Negative for chills, fever and weight loss.  HENT:  Negative for congestion, ear discharge and nosebleeds.   Eyes:  Negative for blurred vision.  Respiratory:  Negative for cough, hemoptysis, sputum production, shortness of  breath and wheezing.   Cardiovascular:  Negative for chest pain, palpitations, orthopnea and claudication.  Gastrointestinal:  Negative for abdominal pain, blood in stool, constipation, diarrhea, heartburn, melena, nausea and vomiting.  Genitourinary:  Negative for dysuria, flank pain, frequency, hematuria and urgency.  Musculoskeletal:  Negative for back pain, joint pain and myalgias.  Skin:  Negative for rash.  Neurological:  Negative for dizziness, tingling, focal weakness, seizures, weakness and headaches.  Endo/Heme/Allergies:  Does not bruise/bleed easily.  Psychiatric/Behavioral:  Negative for depression and suicidal ideas. The patient does not have insomnia.     Allergies  Allergen Reactions   Ibuprofen Swelling   Penicillins Swelling and Hives    lips Other reaction(s): Other (See Comments) Edema (lips)    Patient Active Problem List   Diagnosis Date Noted   Cirrhosis ([place]) 12/09/2021   Chest pain 12/08/2021   History of CVA (cerebrovascular accident) 12/08/2021   Left flank pain 12/08/2021   Colonic diverticular abscess 03/30/2020   Periorbital hematoma 05/07/2016   Acute kidney failure, unspecified ([place]) 05/07/2016   Chronic sinusitis 05/07/2016   Hypertrophy of prostate with urinary obstruction and other lower urinary tract symptoms (LUTS) 05/07/2016   Benign prostatic hyperplasia with urinary obstruction 08/17/2015   Controlled type 2 diabetes mellitus without complication ([place]) 63/02/6008   Psoriasis 08/16/2015   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   DM type 2 with diabetic peripheral neuropathy ([place]) 08/10/2015   Cerebrovascular accident (CVA) ([place]) 08/18/2012   Gout 08/18/2012   Depressive disorder, not elsewhere classified  07/31/2011   BPH (benign prostatic hyperplasia) 06/19/2011   Hypertension 06/19/2011   Diabetes mellitus type II, controlled ([place]) 06/19/2010   Hyperlipidemia 12/11/2009   HYPERTENSION, BENIGN 12/11/2009   Coronary atherosclerosis 12/11/2009   CAD,  AUTOLOGOUS BYPASS GRAFT 12/11/2009     Past Medical History:  Diagnosis Date   Actinic keratosis    Arrhythmia    A-Fib   BPH (benign prostatic hyperplasia)    Coronary artery disease 02/11/2007   CABG X 3  @ [facility]   CVA (cerebral infarction) 02/11/2012   Diabetes mellitus without complication ([place])    Enlarged prostate    GERD (gastroesophageal reflux disease)    Hyperlipidemia    Hypertension    Kidney stone    Renal disorder    Skin cancer    scalp, tx by Dr Sharlett Iles 2000s?     Past Surgical History:  Procedure Laterality Date   cataract surgery Bilateral    CORONARY ARTERY BYPASS GRAFT  2009   CABG X 3 @ [facility]   TEAR DUCT PROBING Right 05/07/2016   Procedure: Evacuation of hematoma, right peri-orbital bleed.;  Surgeon: Clista Bernhardt, MD;  Location: [place];  Service: Ophthalmology;  Laterality: Right;    Social History   Socioeconomic History   Marital status: Widowed    Spouse name: Not on file   Number of children: Not on file   Years of education: Not on file   Highest education level: Not on file  Occupational History   Not on file  Tobacco Use   Smoking status: Never   Smokeless tobacco: Never  Vaping Use   Vaping Use: Never used  Substance and Sexual Activity   Alcohol use: No    Comment: occasional   Drug use: No   Sexual activity: Not Currently  Other Topics Concern   Not on file  Social History Narrative   Not on file   Social Determinants of Health   Financial Resource Strain: Not on file  Food Insecurity: Not on file  Transportation Needs: Not on file  Physical Activity: Not on file  Stress: Not on file  Social Connections: Not on file  Intimate Partner Violence: Not on file     Family History  Family history unknown: Yes     Current Outpatient Medications:    amLODipine (NORVASC) 10 MG tablet, TAKE 1 TABLET BY MOUTH EVERY DAY (Patient taking differently: Take 10 mg by mouth daily.), Disp: 30 tablet, Rfl: 6   aspirin EC 81 MG  tablet, Take 81 mg by mouth., Disp: , Rfl:    atorvastatin (LIPITOR) 10 MG tablet, Take 1 tablet (10 mg total) by mouth every other day. (Patient taking differently: Take 10 mg by mouth daily.), Disp: 90 tablet, Rfl: 1   clopidogrel (PLAVIX) 75 MG tablet, TAKE 1 TABLET(75 MG) BY MOUTH DAILY (Patient taking differently: Take 75 mg by mouth daily.), Disp: 90 tablet, Rfl: 0   ezetimibe (ZETIA) 10 MG tablet, Take 10 mg by mouth daily., Disp: , Rfl:    FLUoxetine (PROZAC) 20 MG capsule, Take 20 mg by mouth daily., Disp: , Rfl:    gabapentin (NEURONTIN) 300 MG capsule, Take 300 mg by mouth 3 (three) times daily. , Disp: , Rfl:    insulin detemir (LEVEMIR) 100 UNIT/ML injection, Inject 45 Units into the skin daily., Disp: , Rfl:    LEVEMIR FLEXPEN 100 UNIT/ML FlexPen, Inject 18 Units into the skin at bedtime., Disp: 15 mL, Rfl:    lisinopril (PRINIVIL,ZESTRIL) 20  MG tablet, Take 20 mg by mouth daily. , Disp: , Rfl:    Magnesium 500 MG TABS, Take 500 mg by mouth daily., Disp: , Rfl:    metFORMIN (GLUCOPHAGE-XR) 500 MG 24 hr tablet, Take 500 mg by mouth 2 (two) times daily., Disp: , Rfl:    omeprazole (PRILOSEC) 40 MG capsule, Take 40 mg by mouth daily., Disp: , Rfl:    insulin lispro (HUMALOG) 100 UNIT/ML KwikPen, 10 Units 2 (two) times daily with a meal. (Patient not taking: Reported on 12/31/2021), Disp: , Rfl:    metoprolol tartrate (LOPRESSOR) 25 MG tablet, Take 25 mg by mouth 2 (two) times daily., Disp: , Rfl:    Physical exam:  Vitals:   12/31/21 1320  BP: (!) 146/66  Pulse: 73  Temp: 97.8 F (36.6 C)  SpO2: 98%  Weight: 199 lb (90.3 kg)   Physical Exam Constitutional:      General: He is not in acute distress. Cardiovascular:     Rate and Rhythm: Normal rate and regular rhythm.     Heart sounds: Normal heart sounds.  Pulmonary:     Effort: Pulmonary effort is normal.     Breath sounds: Normal breath sounds.  Abdominal:     General: Bowel sounds are normal.     Palpations: Abdomen  is soft.  Skin:    General: Skin is warm and dry.  Neurological:     Mental Status: He is alert and oriented to person, place, and time.           Latest Ref Rng & Units 12/14/2021   11:35 AM  CMP  Glucose 70 - 99 mg/dL 156   BUN 8 - 23 mg/dL 26   Creatinine 0.61 - 1.24 mg/dL 1.49   Sodium 135 - 145 mmol/L 132   Potassium 3.5 - 5.1 mmol/L 4.6   Chloride 98 - 111 mmol/L 97   CO2 22 - 32 mmol/L 25   Calcium 8.9 - 10.3 mg/dL 9.6   Total Protein 6.5 - 8.1 g/dL 8.9   Total Bilirubin 0.3 - 1.2 mg/dL 1.2   Alkaline Phos 38 - 126 U/L 130   AST 15 - 41 U/L 55   ALT 0 - 44 U/L 50       Latest Ref Rng & Units 12/14/2021   11:35 AM  CBC  WBC 4.0 - 10.5 K/uL 15.2   Hemoglobin 13.0 - 17.0 g/dL 13.3   Hematocrit 39.0 - 52.0 % 39.1   Platelets 150 - 400 K/uL 176     No images are attached to the encounter.  MR ABDOMEN WWO CONTRAST  Result Date: 12/20/2021 CLINICAL DATA:  Bilateral liver lesions on ultrasound.  Cirrhosis. EXAM: MRI ABDOMEN WITHOUT AND WITH CONTRAST TECHNIQUE: Multiplanar multisequence MR imaging of the abdomen was performed both before and after the administration of intravenous contrast. CONTRAST:  6m GADAVIST GADOBUTROL 1 MMOL/ML IV SOLN COMPARISON:  12/08/2021 CT and 12/14/2021 ultrasound. FINDINGS: Lower chest: Cardiomegaly without pericardial or pleural effusion. Hepatobiliary: Mild cirrhosis. Dominant mass extending off the superior aspect of segment 2 measures 6.1 x 3.8 by 3.5 cm on 14/20 and 52/19. Demonstrates peripheral arterial and portal venous/delayed enhancement. Categorized as LR M. Multiple, on the order of 40-50 foci of heterogeneous arterial phase hyperenhancement throughout both lobes of the liver on series 13. Example 1.3 cm within the posterior right hepatic lobe on 33/3. This demonstrates vague non peripheral washout on 33/15 and is considered LR 4. The majority of the other lesions  are too small to entirely characterized on portal venous phase and  delayed images, but are suspicious for multifocal hepatocellular carcinoma (LR 3). Gallstones of up to 6 mm without acute cholecystitis or biliary duct dilatation. Pancreas:  Normal, without mass or ductal dilatation. Spleen:  Normal in size, without focal abnormality. Adrenals/Urinary Tract: Left adrenal nodule of 1.1 cm on 42/15 is poorly evaluated on motion degraded in an out of phase images. Not readily apparent on 04/01/20 CT. Bilateral renal cysts of up to 8.4 cm . In the absence of clinically indicated signs/symptoms require(s) no independent follow-up. No hydronephrosis. Stomach/Bowel: Normal stomach and small bowel. Colonic stool burden suggests constipation. Vascular/Lymphatic: Aortic atherosclerosis. Patent splenic vein. The peripheral left portal vein is likely involved with the dominant high left hepatic lobe tumor. Prominent nodes in the porta hepatis are similar to 2022 and likely reactive. Example at 1.0 cm on 34/15. Chronic soft tissue thickening in the small bowel mesentey is likely related to mesenteric adenitis/panniculitis. Other:  No ascites. Musculoskeletal: No acute osseous abnormality. IMPRESSION: 1. Dominant high left hepatic lobe mass in the setting of cirrhosis. Although this is favored to represent hepatocellular carcinoma, it is technically classified as LR-M. Consider tissue sampling. 2. Innumerable tiny foci of arterial phase hyperenhancement, some of which are hypoenhancing on portal venous phase imaging. Suspicious for multifocal hepatocellular carcinoma, but technically classified as LR 4 and LR 3. 3. Left adrenal nodule which is indeterminate. Not readily apparent on CT of 2022. Cannot exclude metastatic disease. 4. Cholelithiasis 5.  Aortic Atherosclerosis (ICD10-I70.0). 6.  Possible constipation. Electronically Signed   By: Abigail Miyamoto M.D.   On: 12/20/2021 16:02   US Abdomen Limited RUQ (LIVER/GB)  Result Date: 12/14/2021 CLINICAL DATA:  88 year old male with UPPER  abdominal pain. EXAM: ULTRASOUND ABDOMEN LIMITED RIGHT UPPER QUADRANT COMPARISON:  12/08/2021 CT and prior studies FINDINGS: Gallbladder: Multiple mobile gallstones are noted, the largest measuring 1.3 cm. Gallbladder sludge is present. There is no evidence of gallbladder wall thickening, pericholecystic fluid or sonographic Murphy sign. Common bile duct: Diameter: 3.7 mm. There is no evidence of intrahepatic or extrahepatic biliary dilatation. Liver: Nodular hepatic contour likely represent cirrhosis. Heterogeneous hepatic echotexture noted with multiple ill-defined hypoechoic areas noted, the largest measuring 2.8 cm in the RIGHT lobe and 2.1 cm in the LEFT lobe. Portal vein is patent on color Doppler imaging with normal direction of blood flow towards the liver. Other: None. IMPRESSION: 1. Cirrhosis with heterogeneous hepatic echotexture and multiple ill-defined hypoechoic areas, the largest measuring 2.8 cm in the RIGHT lobe and 2.1 cm in the LEFT lobe. These are indeterminate and hepatocellular carcinoma not excluded. Further evaluation with MRI without and with contrast is recommended. 2. Cholelithiasis and gallbladder sludge without evidence of acute cholecystitis. 3. No biliary dilatation. Electronically Signed   By: Margarette Canada M.D.   On: 12/14/2021 14:50   CT Angio Chest PE W and/or Wo Contrast  Result Date: 12/09/2021 CLINICAL DATA:  Evaluate for pulmonary embolism. EXAM: CT ANGIOGRAPHY CHEST WITH CONTRAST TECHNIQUE: Multidetector CT imaging of the chest was performed using the standard protocol during bolus administration of intravenous contrast. Multiplanar CT image reconstructions and MIPs were obtained to evaluate the vascular anatomy. RADIATION DOSE REDUCTION: This exam was performed according to the departmental dose-optimization program which includes automated exposure control, adjustment of the mA and/or kV according to patient size and/or use of iterative reconstruction technique. CONTRAST:   47m OMNIPAQUE IOHEXOL 350 MG/ML SOLN COMPARISON:  None Available. FINDINGS: Cardiovascular: Satisfactory opacification  of the pulmonary arteries to the segmental level. No evidence of pulmonary embolism. Previous median sternotomy and CABG procedure. Mild cardiac enlargement. No pericardial effusion. Aortic atherosclerotic calcifications. Mediastinum/Nodes: No enlarged mediastinal, hilar, or axillary lymph nodes. Thyroid gland, trachea, and esophagus demonstrate no significant findings. Lungs/Pleura: Mild subsegmental atelectasis identified within the lung bases. No pleural effusion. No airspace consolidation or signs of interstitial edema. No suspicious pulmonary nodule or mass. Upper Abdomen: The liver has a diffusely nodular contour concerning for cirrhosis. No acute abnormality identified. Musculoskeletal: Spondylosis noted within the thoracic spine. Previous median sternotomy. No acute or suspicious osseous findings. Review of the MIP images confirms the above findings. IMPRESSION: 1. No evidence for acute pulmonary embolism. 2. Status post CABG. 3. Morphologic features of the liver concerning for cirrhosis. 4.  Aortic Atherosclerosis (ICD10-I70.0). Electronically Signed   By: Kerby Moors M.D.   On: 12/09/2021 08:20   CT ABDOMEN PELVIS W CONTRAST  Result Date: 12/08/2021 CLINICAL DATA:  Abdominal pain EXAM: CT ABDOMEN AND PELVIS WITH CONTRAST TECHNIQUE: Multidetector CT imaging of the abdomen and pelvis was performed using the standard protocol following bolus administration of intravenous contrast. RADIATION DOSE REDUCTION: This exam was performed according to the departmental dose-optimization program which includes automated exposure control, adjustment of the mA and/or kV according to patient size and/or use of iterative reconstruction technique. CONTRAST:  46m OMNIPAQUE IOHEXOL 300 MG/ML  SOLN COMPARISON:  CT abdomen pelvis dated 04/01/2020. FINDINGS: Lower chest: There is mild bibasilar  atelectasis. Hepatobiliary: The previously described enhancing lesion in the left hepatic lobe near the dome measures approximately 3.6 x 2.5 cm (series 2, image 15) and is incompletely characterized on this exam. Gallstones are noted. No gallbladder wall thickening or biliary dilatation. Pancreas: Unremarkable. No pancreatic ductal dilatation. Spleen: Normal in size without focal abnormality. Adrenals/Urinary Tract: Adrenal glands are unremarkable. Bilateral renal cysts appear similar to prior exam, measuring up to 9 cm on the right. No imaging follow-up is recommended for this finding. Otherwise, the kidneys are normal, without renal calculi, focal solid lesion, or hydronephrosis. Bladder is unremarkable. Stomach/Bowel: Stomach is within normal limits. Appendix appears normal. There is colonic diverticulosis without evidence of diverticulitis. No evidence of bowel wall thickening, distention, or inflammatory changes. Vascular/Lymphatic: Aortic atherosclerosis. Multiple mesenteric lymph nodes are at the upper limits of normal. 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 Reproductive: The prostate is enlarged, measuring 5.2 cm in transverse dimension. Other: No abdominal wall hernia or abnormality. No abdominopelvic ascites. Musculoskeletal: Degenerative changes are seen in the spine. IMPRESSION: 1. No acute process in the abdomen or pelvis. 2. Hepatic lesion which is incompletely characterized on this exam. Nonemergent MR abdomen without and with contrast could be performed for further evaluation if clinically indicated. 3. 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 Aortic Atherosclerosis (ICD10-I70.0). Electronically Signed   By: TZerita BoersM.D.   On: 12/08/2021 17:17   DG Chest 2 View  Result Date: 12/08/2021 CLINICAL DATA:  Chest pain.  EXAM: CHEST - 2 VIEW COMPARISON:  05/10/2016 FINDINGS: Prior median sternotomy and CABG.The cardiomediastinal contours are normal. Subsegmental atelectasis or scar at the left lung base. Pulmonary vasculature is normal. No consolidation, pleural effusion, or pneumothorax. No acute osseous abnormalities are seen. IMPRESSION: Subsegmental atelectasis or scar at the left lung base. Otherwise no acute abnormality. Electronically Signed   By: MKeith RakeM.D.   On: 12/08/2021 14:14  Assessment and plan- Patient is a 89 y.o. male referred for large dominant left hepatic lobe mass and findings concerning for multifocal hepatocellular carcinoma  Patient has underlying cirrhosis of unclear etiology and I am checking hemochromatosis testing as well as celiac disease panel today.  With regards to his hepatic mass he has a dominant segment 2 hepatic mass measuring 6.1 cm and other small foci concerning for multifocal hepatocellular carcinoma.  There is also concern for left portal vein involvement.  However none of these lesions for LI-RADS 5 and I therefore recommend an ultrasound-guided biopsy of the dominant hepatic mass.  AFP was mildly elevated at the GI office at 7.1.  I am repeating AFP as well as CA 19-9 today along with PT/INR.  I will see the patient back in 2 weeks time to discuss the results of biopsy and further management.  CT scan did not otherwise show evidence of distant metastatic disease.   Thank you for this kind referral and the opportunity to participate in the care of this patient   Visit Diagnosis 1. Liver mass   2. Other cirrhosis of liver ([place])   3. Abnormal liver scan   4. Personal history of malignant neoplasm of pancreas     Dr. Randa Evens, MD, MPH [facility] at [facility] [phone] 12/31/2021 4:38 PM

## 2022-01-01 ENCOUNTER — Emergency Department: Payer: Medicare Other

## 2022-01-01 ENCOUNTER — Other Ambulatory Visit: Payer: Self-pay

## 2022-01-01 ENCOUNTER — Encounter: Payer: Self-pay | Admitting: Radiology

## 2022-01-01 ENCOUNTER — Inpatient Hospital Stay
Admission: EM | Admit: 2022-01-01 | Discharge: 2022-01-09 | DRG: 853 | Disposition: A | Discharge status: Home / Self Care | Payer: Medicare Other | Attending: Internal Medicine | Admitting: Internal Medicine

## 2022-01-01 DIAGNOSIS — Z85828 Personal history of other malignant neoplasm of skin: Secondary | ICD-10-CM

## 2022-01-01 DIAGNOSIS — N179 Acute kidney failure, unspecified: Secondary | ICD-10-CM | POA: Diagnosis present

## 2022-01-01 DIAGNOSIS — I2510 Atherosclerotic heart disease of native coronary artery without angina pectoris: Secondary | ICD-10-CM | POA: Diagnosis present

## 2022-01-01 DIAGNOSIS — A4181 Sepsis due to Enterococcus: Principal | ICD-10-CM | POA: Diagnosis present

## 2022-01-01 DIAGNOSIS — E872 Acidosis, unspecified: Secondary | ICD-10-CM | POA: Diagnosis present

## 2022-01-01 DIAGNOSIS — D696 Thrombocytopenia, unspecified: Secondary | ICD-10-CM | POA: Diagnosis present

## 2022-01-01 DIAGNOSIS — K8010 Calculus of gallbladder with chronic cholecystitis without obstruction: Secondary | ICD-10-CM

## 2022-01-01 DIAGNOSIS — R6510 Systemic inflammatory response syndrome (SIRS) of non-infectious origin without acute organ dysfunction: Secondary | ICD-10-CM | POA: Diagnosis present

## 2022-01-01 DIAGNOSIS — Z886 Allergy status to analgesic agent status: Secondary | ICD-10-CM

## 2022-01-01 DIAGNOSIS — A4151 Sepsis due to Escherichia coli [E. coli]: Secondary | ICD-10-CM | POA: Diagnosis not present

## 2022-01-01 DIAGNOSIS — B961 Klebsiella pneumoniae [K. pneumoniae] as the cause of diseases classified elsewhere: Secondary | ICD-10-CM | POA: Diagnosis present

## 2022-01-01 DIAGNOSIS — R6520 Severe sepsis without septic shock: Secondary | ICD-10-CM | POA: Diagnosis present

## 2022-01-01 DIAGNOSIS — E871 Hypo-osmolality and hyponatremia: Secondary | ICD-10-CM | POA: Diagnosis present

## 2022-01-01 DIAGNOSIS — E1142 Type 2 diabetes mellitus with diabetic polyneuropathy: Secondary | ICD-10-CM | POA: Diagnosis present

## 2022-01-01 DIAGNOSIS — I25810 Atherosclerosis of coronary artery bypass graft(s) without angina pectoris: Secondary | ICD-10-CM | POA: Diagnosis present

## 2022-01-01 DIAGNOSIS — R1013 Epigastric pain: Secondary | ICD-10-CM | POA: Diagnosis present

## 2022-01-01 DIAGNOSIS — Z8673 Personal history of transient ischemic attack (TIA), and cerebral infarction without residual deficits: Secondary | ICD-10-CM | POA: Diagnosis not present

## 2022-01-01 DIAGNOSIS — Z7902 Long term (current) use of antithrombotics/antiplatelets: Secondary | ICD-10-CM

## 2022-01-01 DIAGNOSIS — K8012 Calculus of gallbladder with acute and chronic cholecystitis without obstruction: Secondary | ICD-10-CM | POA: Diagnosis present

## 2022-01-01 DIAGNOSIS — E785 Hyperlipidemia, unspecified: Secondary | ICD-10-CM | POA: Diagnosis present

## 2022-01-01 DIAGNOSIS — K810 Acute cholecystitis: Secondary | ICD-10-CM | POA: Diagnosis not present

## 2022-01-01 DIAGNOSIS — J9601 Acute respiratory failure with hypoxia: Secondary | ICD-10-CM | POA: Diagnosis present

## 2022-01-01 DIAGNOSIS — R0602 Shortness of breath: Principal | ICD-10-CM

## 2022-01-01 DIAGNOSIS — Z7982 Long term (current) use of aspirin: Secondary | ICD-10-CM

## 2022-01-01 DIAGNOSIS — I10 Essential (primary) hypertension: Secondary | ICD-10-CM | POA: Diagnosis present

## 2022-01-01 DIAGNOSIS — R7989 Other specified abnormal findings of blood chemistry: Secondary | ICD-10-CM

## 2022-01-01 DIAGNOSIS — E1165 Type 2 diabetes mellitus with hyperglycemia: Secondary | ICD-10-CM | POA: Diagnosis present

## 2022-01-01 DIAGNOSIS — Z1619 Resistance to other specified beta lactam antibiotics: Secondary | ICD-10-CM | POA: Diagnosis not present

## 2022-01-01 DIAGNOSIS — Z7984 Long term (current) use of oral hypoglycemic drugs: Secondary | ICD-10-CM

## 2022-01-01 DIAGNOSIS — I25718 Atherosclerosis of autologous vein coronary artery bypass graft(s) with other forms of angina pectoris: Secondary | ICD-10-CM

## 2022-01-01 DIAGNOSIS — A419 Sepsis, unspecified organism: Secondary | ICD-10-CM | POA: Diagnosis not present

## 2022-01-01 DIAGNOSIS — Z1152 Encounter for screening for COVID-19: Secondary | ICD-10-CM

## 2022-01-01 DIAGNOSIS — Z794 Long term (current) use of insulin: Secondary | ICD-10-CM | POA: Diagnosis not present

## 2022-01-01 DIAGNOSIS — Z951 Presence of aortocoronary bypass graft: Secondary | ICD-10-CM

## 2022-01-01 DIAGNOSIS — K7460 Unspecified cirrhosis of liver: Secondary | ICD-10-CM | POA: Diagnosis not present

## 2022-01-01 DIAGNOSIS — I4891 Unspecified atrial fibrillation: Secondary | ICD-10-CM | POA: Diagnosis present

## 2022-01-01 DIAGNOSIS — Z880 Allergy status to penicillin: Secondary | ICD-10-CM

## 2022-01-01 DIAGNOSIS — C228 Malignant neoplasm of liver, primary, unspecified as to type: Secondary | ICD-10-CM | POA: Diagnosis present

## 2022-01-01 DIAGNOSIS — R0902 Hypoxemia: Secondary | ICD-10-CM

## 2022-01-01 DIAGNOSIS — R160 Hepatomegaly, not elsewhere classified: Secondary | ICD-10-CM | POA: Diagnosis present

## 2022-01-01 DIAGNOSIS — Z79899 Other long term (current) drug therapy: Secondary | ICD-10-CM

## 2022-01-01 DIAGNOSIS — K219 Gastro-esophageal reflux disease without esophagitis: Secondary | ICD-10-CM | POA: Diagnosis present

## 2022-01-01 DIAGNOSIS — N400 Enlarged prostate without lower urinary tract symptoms: Secondary | ICD-10-CM | POA: Diagnosis present

## 2022-01-01 LAB — COMPREHENSIVE METABOLIC PANEL
ALT: 25 U/L (ref 0–44)
AST: 47 U/L — ABNORMAL HIGH (ref 15–41)
Albumin: 3.1 g/dL — ABNORMAL LOW (ref 3.5–5.0)
Alkaline Phosphatase: 121 U/L (ref 38–126)
Anion gap: 11 (ref 5–15)
BUN: 26 mg/dL — ABNORMAL HIGH (ref 8–23)
CO2: 21 mmol/L — ABNORMAL LOW (ref 22–32)
Calcium: 8.5 mg/dL — ABNORMAL LOW (ref 8.9–10.3)
Chloride: 100 mmol/L (ref 98–111)
Creatinine, Ser: 1.6 mg/dL — ABNORMAL HIGH (ref 0.61–1.24)
GFR, Estimated: 40 mL/min — ABNORMAL LOW (ref >60)
Glucose, Bld: 173 mg/dL — ABNORMAL HIGH (ref 70–99)
Potassium: 5.4 mmol/L — ABNORMAL HIGH (ref 3.5–5.1)
Sodium: 132 mmol/L — ABNORMAL LOW (ref 135–145)
Total Bilirubin: 1.7 mg/dL — ABNORMAL HIGH (ref 0.3–1.2)
Total Protein: 7.4 g/dL (ref 6.5–8.1)

## 2022-01-01 LAB — CBC WITH DIFFERENTIAL/PLATELET
Abs Immature Granulocytes: 0.12 10*3/uL — ABNORMAL HIGH (ref 0.00–0.07)
Basophils Absolute: 0.1 10*3/uL (ref 0.0–0.1)
Basophils Relative: 0 %
Eosinophils Absolute: 0 10*3/uL (ref 0.0–0.5)
Eosinophils Relative: 0 %
HCT: 34.5 % — ABNORMAL LOW (ref 39.0–52.0)
Hemoglobin: 11.5 g/dL — ABNORMAL LOW (ref 13.0–17.0)
Immature Granulocytes: 1 %
Lymphocytes Relative: 32 %
Lymphs Abs: 5.1 10*3/uL — ABNORMAL HIGH (ref 0.7–4.0)
MCH: 27.9 pg (ref 26.0–34.0)
MCHC: 33.3 g/dL (ref 30.0–36.0)
MCV: 83.7 fL (ref 80.0–100.0)
Monocytes Absolute: 0.3 10*3/uL (ref 0.1–1.0)
Monocytes Relative: 2 %
Neutro Abs: 10.2 10*3/uL — ABNORMAL HIGH (ref 1.7–7.7)
Neutrophils Relative %: 65 %
Platelets: 179 10*3/uL (ref 150–400)
RBC: 4.12 MIL/uL — ABNORMAL LOW (ref 4.22–5.81)
RDW: 18.2 % — ABNORMAL HIGH (ref 11.5–15.5)
WBC: 15.9 10*3/uL — ABNORMAL HIGH (ref 4.0–10.5)
nRBC: 0.1 % (ref 0.0–0.2)

## 2022-01-01 LAB — CANCER ANTIGEN 19-9: CA 19-9: 14 U/mL (ref 0–35)

## 2022-01-01 LAB — RESP PANEL BY RT-PCR (FLU A&B, COVID) ARPGX2
Influenza A by PCR: NEGATIVE
Influenza B by PCR: NEGATIVE
SARS Coronavirus 2 by RT PCR: NEGATIVE

## 2022-01-01 LAB — LACTIC ACID, PLASMA: Lactic Acid, Venous: 4.5 mmol/L (ref 0.5–1.9)

## 2022-01-01 LAB — AFP TUMOR MARKER: AFP, Serum, Tumor Marker: 7.7 ng/mL — ABNORMAL HIGH (ref 0.0–6.4)

## 2022-01-01 MED ORDER — HEPARIN SODIUM (PORCINE) 5000 UNIT/ML IJ SOLN
5000.0000 [IU] | Freq: Three times a day (TID) | INTRAMUSCULAR | Status: DC
  Administered 2022-01-02 – 2022-01-05 (×11): 5000 [IU] via SUBCUTANEOUS
  Filled 2022-01-01 (×11): qty 1

## 2022-01-01 MED ORDER — INSULIN ASPART 100 UNIT/ML IJ SOLN
0.0000 [IU] | Freq: Three times a day (TID) | INTRAMUSCULAR | Status: DC
  Administered 2022-01-02 – 2022-01-08 (×14): 1 [IU] via SUBCUTANEOUS
  Administered 2022-01-08: 2 [IU] via SUBCUTANEOUS
  Administered 2022-01-09: 1 [IU] via SUBCUTANEOUS
  Filled 2022-01-01 (×16): qty 1

## 2022-01-01 MED ORDER — ONDANSETRON HCL 4 MG PO TABS: 4.0000 mg | ORAL_TABLET | Freq: Four times a day (QID) | ORAL | Status: DC | PRN

## 2022-01-01 MED ORDER — INSULIN ASPART 100 UNIT/ML IJ SOLN
0.0000 [IU] | Freq: Every day | INTRAMUSCULAR | Status: DC
  Administered 2022-01-06: 2 [IU] via SUBCUTANEOUS
  Filled 2022-01-01: qty 1

## 2022-01-01 MED ORDER — CLOPIDOGREL BISULFATE 75 MG PO TABS
75.0000 mg | ORAL_TABLET | Freq: Every day | ORAL | Status: DC
  Administered 2022-01-02 – 2022-01-05 (×4): 75 mg via ORAL
  Filled 2022-01-01 (×4): qty 1

## 2022-01-01 MED ORDER — LACTATED RINGERS IV BOLUS (SEPSIS)
1000.0000 mL | Freq: Once | INTRAVENOUS | Status: AC
  Administered 2022-01-01: 1000 mL via INTRAVENOUS

## 2022-01-01 MED ORDER — ASPIRIN 81 MG PO TBEC
81.0000 mg | DELAYED_RELEASE_TABLET | Freq: Every day | ORAL | Status: DC
  Administered 2022-01-02 – 2022-01-09 (×7): 81 mg via ORAL
  Filled 2022-01-01 (×7): qty 1

## 2022-01-01 MED ORDER — SODIUM CHLORIDE 0.9 % IV SOLN
2.0000 g | Freq: Once | INTRAVENOUS | Status: AC
  Administered 2022-01-01: 2 g via INTRAVENOUS
  Filled 2022-01-01: qty 12.5

## 2022-01-01 MED ORDER — PANTOPRAZOLE SODIUM 40 MG PO TBEC
40.0000 mg | DELAYED_RELEASE_TABLET | Freq: Every day | ORAL | Status: DC
  Administered 2022-01-02 – 2022-01-09 (×8): 40 mg via ORAL
  Filled 2022-01-01 (×8): qty 1

## 2022-01-01 MED ORDER — ACETAMINOPHEN 325 MG PO TABS: 650.0000 mg | ORAL_TABLET | Freq: Four times a day (QID) | ORAL | Status: DC | PRN

## 2022-01-01 MED ORDER — LACTATED RINGERS IV BOLUS
1340.0000 mL | Freq: Once | INTRAVENOUS | Status: AC
  Administered 2022-01-01: 1340 mL via INTRAVENOUS

## 2022-01-01 MED ORDER — ATORVASTATIN CALCIUM 10 MG PO TABS
10.0000 mg | ORAL_TABLET | Freq: Every day | ORAL | Status: DC
  Administered 2022-01-02 – 2022-01-09 (×8): 10 mg via ORAL
  Filled 2022-01-01 (×8): qty 1

## 2022-01-01 MED ORDER — METRONIDAZOLE 500 MG/100ML IV SOLN
500.0000 mg | Freq: Once | INTRAVENOUS | Status: AC
  Administered 2022-01-01: 500 mg via INTRAVENOUS
  Filled 2022-01-01: qty 100

## 2022-01-01 MED ORDER — ONDANSETRON HCL 4 MG/2ML IJ SOLN
4.0000 mg | Freq: Four times a day (QID) | INTRAMUSCULAR | Status: DC | PRN
  Administered 2022-01-02 – 2022-01-04 (×2): 4 mg via INTRAVENOUS
  Filled 2022-01-01 (×2): qty 2

## 2022-01-01 MED ORDER — IOHEXOL 350 MG/ML SOLN
60.0000 mL | Freq: Once | INTRAVENOUS | Status: AC | PRN
  Administered 2022-01-01: 60 mL via INTRAVENOUS

## 2022-01-01 MED ORDER — VANCOMYCIN HCL 1500 MG/300ML IV SOLN
1500.0000 mg | Freq: Once | INTRAVENOUS | Status: AC
  Administered 2022-01-01: 1500 mg via INTRAVENOUS
  Filled 2022-01-01: qty 300

## 2022-01-01 MED ORDER — SODIUM CHLORIDE 0.9% FLUSH
3.0000 mL | Freq: Two times a day (BID) | INTRAVENOUS | Status: DC
  Administered 2022-01-02 – 2022-01-09 (×11): 3 mL via INTRAVENOUS

## 2022-01-01 MED ORDER — VANCOMYCIN HCL IN DEXTROSE 1-5 GM/200ML-% IV SOLN: 1000.0000 mg | Freq: Once | INTRAVENOUS | Status: DC

## 2022-01-01 MED ORDER — METRONIDAZOLE 500 MG/100ML IV SOLN: 500.0000 mg | Freq: Two times a day (BID) | INTRAVENOUS | Status: DC

## 2022-01-01 MED ORDER — SODIUM CHLORIDE 0.9 % IV SOLN: INTRAVENOUS | Status: AC

## 2022-01-01 MED ORDER — ACETAMINOPHEN 650 MG RE SUPP: 650.0000 mg | Freq: Four times a day (QID) | RECTAL | Status: DC | PRN

## 2022-01-01 NOTE — H&P (Signed)
History and Physical    ROLEN CONGER UDJ:[id number] DOB: March 11, 1927 DOA: 01/01/2022  PCP: Pcp, No   Patient coming from: Home   Chief Complaint: Shaking chills, loss of appetite, upper abdominal pain   HPI: SOHAM HOLLETT is a 88 y.o. male with medical history significant for hypertension, insulin-dependent diabetes mellitus, CAD status post CABG, and history of CVA, now presenting to the emergency department with shaking chills, loss of appetite, and upper abdominal pain.  Patient is accompanied by his son who assists with the history.  He was reportedly doing well until yesterday evening when he developed shaking chills and loss of appetite.  He had a little bit of ice cream today but otherwise had not had anything to eat or drink since last night.  He has had some pain in the epigastrium associated with this but denies nausea or vomiting.  He has not been coughing, denies feeling short of breath, denies dysuria or suprapubic pain, and denies chest pain.  He was febrile with EMS and treated with 500 mL of saline and acetaminophen prior to arrival in the ED.  ED Course: Upon arrival to the ED, patient is found to be afebrile and saturating 90 on 2 L/min of supplemental oxygen with normal heart rate and stable blood pressure.  Chest x-ray is negative for acute cardiopulmonary disease.  No PE or other acute pulmonary disease noted on CTA chest.  Blood work most notable for creatinine 1.60, WBC 15,900, and lactic acid 4.5.  Blood cultures were collected in the ED and the patient was given 2.3 L of LR, vancomycin, cefepime, and Flagyl.  Review of Systems:  All other systems reviewed and apart from HPI, are negative.  Past Medical History:  Diagnosis Date   Actinic keratosis    Arrhythmia    A-Fib   BPH (benign prostatic hyperplasia)    Coronary artery disease 02/11/2007   CABG X 3  @ [facility]   CVA (cerebral infarction) 02/11/2012   Diabetes mellitus without complication (HCC)     Enlarged prostate    GERD (gastroesophageal reflux disease)    Hyperlipidemia    Hypertension    Kidney stone    Renal disorder    Skin cancer    scalp, tx by Dr Sharlett Iles 2000s?    Past Surgical History:  Procedure Laterality Date   cataract surgery Bilateral    CORONARY ARTERY BYPASS GRAFT  2009   CABG X 3 @ [facility]   TEAR DUCT PROBING Right 05/07/2016   Procedure: Evacuation of hematoma, right peri-orbital bleed.;  Surgeon: Clista Bernhardt, MD;  Location: [facility];  Service: Ophthalmology;  Laterality: Right;    Social History:   reports that he has never smoked. He has never used smokeless tobacco. He reports that he does not drink alcohol and does not use drugs.  Allergies  Allergen Reactions   Ibuprofen Swelling   Penicillins Swelling and Hives    lips Other reaction(s): Other (See Comments) Edema (lips)    Family History  Family history unknown: Yes     Prior to Admission medications   Medication Sig Start Date End Date Taking? Authorizing Provider  amLODipine (NORVASC) 10 MG tablet TAKE 1 TABLET BY MOUTH EVERY DAY Patient taking differently: Take 10 mg by mouth daily. 10/03/12   Minna Merritts, MD  aspirin EC 81 MG tablet Take 81 mg by mouth.    [provider]  atorvastatin (LIPITOR) 10 MG tablet Take 1 tablet (10 mg total) by  mouth every other day. Patient taking differently: Take 10 mg by mouth daily. 12/05/19   Minna Merritts, MD  clopidogrel (PLAVIX) 75 MG tablet TAKE 1 TABLET(75 MG) BY MOUTH DAILY Patient taking differently: Take 75 mg by mouth daily. 08/20/17   Minna Merritts, MD  ezetimibe (ZETIA) 10 MG tablet Take 10 mg by mouth daily.    [provider]  FLUoxetine (PROZAC) 20 MG capsule Take 20 mg by mouth daily.    [provider]  gabapentin (NEURONTIN) 300 MG capsule Take 300 mg by mouth 3 (three) times daily.  01/24/12   [provider]  insulin detemir (LEVEMIR) 100 UNIT/ML injection Inject 45 Units into  the skin daily.    [provider]  insulin lispro (HUMALOG) 100 UNIT/ML KwikPen 10 Units 2 (two) times daily with a meal. Patient not taking: Reported on 12/31/2021 04/17/21   [provider]  LEVEMIR FLEXPEN 100 UNIT/ML FlexPen Inject 18 Units into the skin at bedtime. 12/09/21   Loletha Grayer, MD  lisinopril (PRINIVIL,ZESTRIL) 20 MG tablet Take 20 mg by mouth daily.  01/27/13   [provider]  Magnesium 500 MG TABS Take 500 mg by mouth daily.    [provider]  metFORMIN (GLUCOPHAGE-XR) 500 MG 24 hr tablet Take 500 mg by mouth 2 (two) times daily. 12/12/21   [provider]  metoprolol tartrate (LOPRESSOR) 25 MG tablet Take 25 mg by mouth 2 (two) times daily.    [provider]  omeprazole (PRILOSEC) 40 MG capsule Take 40 mg by mouth daily.    [provider]    Physical Exam: Vitals:   01/01/22 1956 01/01/22 2002 01/01/22 2302  BP:  (!) 124/56 (!) 150/59  Pulse:  95 (!) 105  Resp:  (!) 27   Temp:  99.6 F (37.6 C) (!) 100.8 F (38.2 C)  TempSrc:   Rectal  SpO2:  90% 94%  Weight: 78 kg    Height: 6' (1.829 m)      Constitutional: NAD, no pallor or diaphoresis   Eyes: PERTLA, lids and conjunctivae normal ENMT: Mucous membranes are moist. Posterior pharynx clear of any exudate or lesions.   Neck: supple, no masses  Respiratory: no wheezing, no crackles. No accessory muscle use.  Cardiovascular: S1 & S2 heard, regular rate and rhythm. No extremity edema.   Abdomen: Tender in upper abdomen without rebound pain or guarding. No distension. Bowel sounds active.  Musculoskeletal: no clubbing / cyanosis. No joint deformity upper and lower extremities.   Skin: no significant rashes, lesions, ulcers. Warm, dry, well-perfused. Neurologic: CN 2-12 grossly intact. Moving all extremities. Alert and oriented.  Psychiatric: Calm. Cooperative.    Labs and Imaging on Admission: I have personally reviewed following labs and  imaging studies  CBC: Recent Labs  Lab 01/01/22 2007  WBC 15.9*  NEUTROABS 10.2*  HGB 11.5*  HCT 34.5*  MCV 83.7  PLT 081   Basic Metabolic Panel: Recent Labs  Lab 01/01/22 2007  NA 132*  K 5.4*  CL 100  CO2 21*  GLUCOSE 173*  BUN 26*  CREATININE 1.60*  CALCIUM 8.5*   GFR: Estimated Creatinine Clearance: 33 mL/min (A) (by C-G formula based on SCr of 1.6 mg/dL (H)). Liver Function Tests: Recent Labs  Lab 01/01/22 2007  AST 47*  ALT 25  ALKPHOS 121  BILITOT 1.7*  PROT 7.4  ALBUMIN 3.1*   No results for input(s): "LIPASE", "AMYLASE" in the last 168 hours. No results for input(s): "  AMMONIA" in the last 168 hours. Coagulation Profile: Recent Labs  Lab 12/31/21 1409  INR 1.1   Cardiac Enzymes: No results for input(s): "CKTOTAL", "CKMB", "CKMBINDEX", "TROPONINI" in the last 168 hours. BNP (last 3 results) No results for input(s): "PROBNP" in the last 8760 hours. HbA1C: No results for input(s): "HGBA1C" in the last 72 hours. CBG: No results for input(s): "GLUCAP" in the last 168 hours. Lipid Profile: No results for input(s): "CHOL", "HDL", "LDLCALC", "TRIG", "CHOLHDL", "LDLDIRECT" in the last 72 hours. Thyroid Function Tests: No results for input(s): "TSH", "T4TOTAL", "FREET4", "T3FREE", "THYROIDAB" in the last 72 hours. Anemia Panel: No results for input(s): "VITAMINB12", "FOLATE", "FERRITIN", "TIBC", "IRON", "RETICCTPCT" in the last 72 hours. Urine analysis:    Component Value Date/Time   COLORURINE YELLOW (A) 12/08/2021 1522   APPEARANCEUR CLEAR (A) 12/08/2021 1522   APPEARANCEUR Clear 03/30/2013 0839   LABSPEC 1.017 12/08/2021 1522   LABSPEC 1.023 03/30/2013 0839   PHURINE 5.0 12/08/2021 1522   GLUCOSEU NEGATIVE 12/08/2021 1522   GLUCOSEU 150 mg/dL 03/30/2013 0839   HGBUR NEGATIVE 12/08/2021 1522   BILIRUBINUR NEGATIVE 12/08/2021 1522   BILIRUBINUR Negative 03/30/2013 0839   KETONESUR NEGATIVE 12/08/2021 1522   PROTEINUR NEGATIVE 12/08/2021 1522    NITRITE NEGATIVE 12/08/2021 1522   LEUKOCYTESUR NEGATIVE 12/08/2021 1522   LEUKOCYTESUR Negative 03/30/2013 0839   Sepsis Labs: '@LABRCNTIP'$ (procalcitonin:4,lacticidven:4) ) Recent Results (from the past 240 hour(s))  Resp Panel by RT-PCR (Flu A&B, Covid) Anterior Nasal Swab     Status: None   Collection Time: 01/01/22  8:15 PM   Specimen: Anterior Nasal Swab  Result Value Ref Range Status   SARS Coronavirus 2 by RT PCR NEGATIVE NEGATIVE Final    Comment: (NOTE) SARS-CoV-2 target nucleic acids are NOT DETECTED.  The SARS-CoV-2 RNA is generally detectable in upper respiratory specimens during the acute phase of infection. The lowest concentration of SARS-CoV-2 viral copies this assay can detect is 138 copies/mL. A negative result does not preclude SARS-Cov-2 infection and should not be used as the sole basis for treatment or other patient management decisions. A negative result may occur with  improper specimen collection/handling, submission of specimen other than nasopharyngeal swab, presence of viral mutation(s) within the areas targeted by this assay, and inadequate number of viral copies(<138 copies/mL). A negative result must be combined with clinical observations, patient history, and epidemiological information. The expected result is Negative.  Fact Sheet for Patients:  EntrepreneurPulse.com.au  Fact Sheet for Healthcare Providers:  IncredibleEmployment.be  This test is no t yet approved or cleared by the [place] FDA and  has been authorized for detection and/or diagnosis of SARS-CoV-2 by FDA under an Emergency Use Authorization (EUA). This EUA will remain  in effect (meaning this test can be used) for the duration of the COVID-19 declaration under Section 564(b)(1) of the Act, 21 U.S.C.section 360bbb-3(b)(1), unless the authorization is terminated  or revoked sooner.       Influenza A by PCR NEGATIVE NEGATIVE Final    Influenza B by PCR NEGATIVE NEGATIVE Final    Comment: (NOTE) The Xpert Xpress SARS-CoV-2/FLU/RSV plus assay is intended as an aid in the diagnosis of influenza from Nasopharyngeal swab specimens and should not be used as a sole basis for treatment. Nasal washings and aspirates are unacceptable for Xpert Xpress SARS-CoV-2/FLU/RSV testing.  Fact Sheet for Patients: EntrepreneurPulse.com.au  Fact Sheet for Healthcare Providers: IncredibleEmployment.be  This test is not yet approved or cleared by the [place] and has been authorized for  detection and/or diagnosis of SARS-CoV-2 by FDA under an Emergency Use Authorization (EUA). This EUA will remain in effect (meaning this test can be used) for the duration of the COVID-19 declaration under Section 564(b)(1) of the Act, 21 U.S.C. section 360bbb-3(b)(1), unless the authorization is terminated or revoked.  Performed at [facility], [address] [place], [place] [address]      Radiological Exams on Admission: CT Angio Chest PE W and/or Wo Contrast  Result Date: 01/01/2022 CLINICAL DATA:  Chest pain and hypoxia.  Difficulty swallowing. EXAM: CT ANGIOGRAPHY CHEST WITH CONTRAST TECHNIQUE: Multidetector CT imaging of the chest was performed using the standard protocol during bolus administration of intravenous contrast. Multiplanar CT image reconstructions and MIPs were obtained to evaluate the vascular anatomy. RADIATION DOSE REDUCTION: This exam was performed according to the departmental dose-optimization program which includes automated exposure control, adjustment of the mA and/or kV according to patient size and/or use of iterative reconstruction technique. CONTRAST:  5m OMNIPAQUE IOHEXOL 350 MG/ML SOLN COMPARISON:  12/09/2021 FINDINGS: Cardiovascular: Good opacification of the central and segmental pulmonary arteries. No focal filling defects. No evidence of significant pulmonary  embolus. Normal heart size. No pericardial effusions. Normal caliber thoracic aorta. No dissection. Calcification of the aorta and coronary arteries. Postoperative changes consistent with coronary bypass. Mediastinum/Nodes: Thyroid gland is unremarkable. Prominent lymph nodes in the supraclavicular regions and mediastinum. Largest are subcarinal lymph nodes measuring about 12 mm short axis dimension. Appearances are similar to prior study. Nonspecific etiology, likely reactive. Lungs/Pleura: Atelectasis in the lung bases. No airspace disease or consolidation. No pleural effusions. No pneumothorax. Upper Abdomen: Nodular contour to the liver suggesting hepatic cirrhosis. Small esophageal hiatal hernia. Scattered upper abdominal lymph nodes without pathologic enlargement. Musculoskeletal: Degenerative changes. No destructive bone lesions. Sternotomy wires. Review of the MIP images confirms the above findings. IMPRESSION: 1. No evidence of significant pulmonary embolus. 2. Aortic atherosclerosis. 3. No evidence of active pulmonary disease. 4. Nonspecific mediastinal lymphadenopathy. No change since prior study. 5. Probable hepatic cirrhosis. Electronically Signed   By: WLucienne CapersM.D.   On: 01/01/2022 22:05   DG Chest Port 1 View  Result Date: 01/01/2022 CLINICAL DATA:  Chest pain EXAM: PORTABLE CHEST 1 VIEW COMPARISON:  12/08/2021 FINDINGS: Prior CABG. Heart and mediastinal contours are within normal limits. No focal opacities or effusions. No acute bony abnormality. IMPRESSION: No active cardiopulmonary disease. Electronically Signed   By: KRolm BaptiseM.D.   On: 01/01/2022 21:04    EKG: Independently reviewed. Sinus rhythm, RBBB, LAFB, similar to priors.   Assessment/Plan   1. Suspected sepsis  - Presents with rigors, loss of appetite, and upper abdominal pain and found to have fever, leukocytosis, and elevated lactate  - COVID and influenza PCR are negative and no evidence for infection seen on  chest CT  - Blood cultures collected in ED, 30 cc/kg IVF bolus was given, and broad-spectrum antibiotics were started  - Check RUQ [place] UA, continue current antibiotics, trend lactate and procalcitonin, consider CT abd/pelvis if pending workup unrevealing, follow cultures and clinical course    2. AKI  - SCr is 1.60 on admission; baseline appears to be closer to 1.1  - Likely prerenal in setting of suspected sepsis and anorexia with hypovolemia  - Continue IVF hydration, hold ACE-i, renally-dose medications, repeat chem panel in am    3. Acute hypoxic respiratory failure  - He was started on 2 Lpm supplemental O2 in ED  - He denies cough and there  is no PE or acute pulmonary findings on CTA chest in ED  - Continue supplemental O2 as needed    4. Cirrhosis; liver mass  - Recent diagnosis, concerning for hepatocellular carcinoma, had initial GI consult this month and saw oncology for the first time 12/31/21  - Continue GI and oncology follow-up on discharge as planned   5. Insulin-dependent DM  - A1c was 7.3% in October 2023  - Check CBGs and continue insulin   6. HTN  - Treat as-needed only for now    7. CAD  - Continue statin and antiplatelets, hold beta-blocker initially given concern for evolving sepsis    8. Hx CVA  - Continue statin and antiplatelets    DVT prophylaxis: sq heparin  Code Status: Full  Level of Care: Level of care: Progressive Family Communication: Son at bedside  Disposition Plan:  Patient is from: home  Anticipated d/c is to: TBD Anticipated d/c date is: 01/04/22  Patient currently: Pending RUQ [place], cultures, repeat lactate  Consults called: None  Admission status: Inpatient     Vianne Bulls, MD [facility]  01/01/2022, 11:21 PM

## 2022-01-01 NOTE — Consult Note (Signed)
PHARMACY -  BRIEF ANTIBIOTIC NOTE   Pharmacy has received consult(s) for Cefepime and Vancomycin from an ED provider.  The patient's profile has been reviewed for ht/wt/allergies/indication/available labs.    One time order(s) placed for : Cefepime 2gm IVPB x 1 Vancomycin '1500mg'$  IVPB x 1  Further antibiotics/pharmacy consults should be ordered by admitting physician if indicated.                       Thank you, Virdie Penning Rodriguez-Guzman PharmD, BCPS 01/01/2022 8:38 PM

## 2022-01-01 NOTE — ED Triage Notes (Signed)
Pt to ED via EMS from home--complaining of chest pain in center center as well as difficulty swallowing. Pt reports being recently sick. Pt had 500 mL NaCl and 975 mg Tylenol in the field r/t fever of 100.9 F.

## 2022-01-01 NOTE — ED Provider Notes (Signed)
[facility] Provider Note    Event Date/Time   First MD Initiated Contact with Patient 01/01/22 2000     (approximate)   History   Chest Pain and Fever   HPI  Randy Frazier is a 88 y.o. male who presents to the emerge [place] department today because he has been feeling sick.  He started feeling sick yesterday.  Symptoms include chest pain.  Located in the center part of the chest. Patient was febrile for EMS. The patient denies any known sick contacts.      Physical Exam   Triage Vital Signs: ED Triage Vitals  Enc Vitals Group     BP 01/01/22 2002 (!) 124/56     Pulse Rate 01/01/22 2002 95     Resp 01/01/22 2002 (!) 27     Temp 01/01/22 2002 99.6 F (37.6 C)     Temp src --      SpO2 01/01/22 2002 90 %     Weight 01/01/22 1956 171 lb 15.3 oz (78 kg)     Height 01/01/22 1956 6' (1.829 m)     Head Circumference --      Peak Flow --      Pain Score 01/01/22 1958 8     Pain Loc --      Pain Edu? --      Excl. in [place]? --     Most recent vital signs: Vitals:   01/01/22 2002  BP: (!) 124/56  Pulse: 95  Resp: (!) 27  Temp: 99.6 F (37.6 C)  SpO2: 90%   General: Awake, alert, oriented. CV:  Good peripheral perfusion. Regular rate and rhythm. Resp:  Slightly increased work of breath. Abd:  No distention.    ED Results / Procedures / Treatments   Labs (all labs ordered are listed, but only abnormal results are displayed) Labs Reviewed  LACTIC ACID, PLASMA - Abnormal; Notable for the following components:      Result Value   Lactic Acid, Venous 4.5 (*)    All other components within normal limits  COMPREHENSIVE METABOLIC PANEL - Abnormal; Notable for the following components:   Sodium 132 (*)    Potassium 5.4 (*)    CO2 21 (*)    Glucose, Bld 173 (*)    BUN 26 (*)    Creatinine, Ser 1.60 (*)    Calcium 8.5 (*)    Albumin 3.1 (*)    AST 47 (*)    Total Bilirubin 1.7 (*)    GFR, Estimated 40 (*)    All other components within  normal limits  CBC WITH DIFFERENTIAL/PLATELET - Abnormal; Notable for the following components:   WBC 15.9 (*)    RBC 4.12 (*)    Hemoglobin 11.5 (*)    HCT 34.5 (*)    RDW 18.2 (*)    Neutro Abs 10.2 (*)    Lymphs Abs 5.1 (*)    Abs Immature Granulocytes 0.12 (*)    All other components within normal limits  RESP PANEL BY RT-PCR (FLU A&B, COVID) ARPGX2  CULTURE, BLOOD (ROUTINE X 2)  CULTURE, BLOOD (ROUTINE X 2)  LACTIC ACID, PLASMA  URINALYSIS, COMPLETE (UACMP) WITH MICROSCOPIC     EKG  I, Nance Pear, attending physician, personally viewed and interpreted this EKG  EKG Time: 1959 Rate: 97 Rhythm: sinus rhythm Axis: left axis deviation Intervals: qtc 458 QRS: RBBB, LAFB ST changes: no st elevation Impression: abnormal ekg    RADIOLOGY I independently  interpreted and visualized the CXR. My interpretation: No pneumonia Radiology interpretation: IMPRESSION:  No active cardiopulmonary disease.   I independently interpreted and visualized the CT angio PE. My interpretation: No large PE Radiology interpretation: IMPRESSION:  1. No evidence of significant pulmonary embolus.  2. Aortic atherosclerosis.  3. No evidence of active pulmonary disease.  4. Nonspecific mediastinal lymphadenopathy. No change since prior  study.  5. Probable hepatic cirrhosis.    PROCEDURES:  Critical Care performed: Yes, see critical care procedure note(s)  Procedures  CRITICAL CARE Performed by: Nance Pear   Total critical care time: 35 minutes  Critical care time was exclusive of separately billable procedures and treating other patients.  Critical care was necessary to treat or prevent imminent or life-threatening deterioration.  Critical care was time spent personally by me on the following activities: development of treatment plan with patient and/or surrogate as well as nursing, discussions with consultants, evaluation of patient's response to treatment, examination of  patient, obtaining history from patient or surrogate, ordering and performing treatments and interventions, ordering and review of laboratory studies, ordering and review of radiographic studies, pulse oximetry and re-evaluation of patient's condition.   MEDICATIONS ORDERED IN ED: Medications - No data to display   IMPRESSION / MDM / [place] / ED COURSE  I reviewed the triage vital signs and the nursing notes.                              Differential diagnosis includes, but is not limited to, pneumonia, COVID, PE  Patient's presentation is most consistent with acute presentation with potential threat to life or bodily function.  The patient is on the cardiac monitor to evaluate for evidence of arrhythmia and/or significant heart rate changes.  Patient presented to the emergency department today because of concerns for feeling sick. Upon arrival to the emergency department he was found to be hypoxic, tempurate of 99.6, tachypnea. Did have concern for infection.  Patient was started on broad-spectrum IV antibiotics.  Blood work was also concerning for infection with elevated white blood cell count elevated lactic acidosis.  Patient was ordered IV fluids.  Chest x-ray without obvious pneumonia.  Did obtain a CT angio to evaluate for possible PE or pneumonia that was not seen on chest x-ray.  CT angio without any PE or obvious cause of the patient's hypoxia.  Discussed with Dr. Myna Hidalgo with the hospital service will plan on admission.     FINAL CLINICAL IMPRESSION(S) / ED DIAGNOSES   Final diagnoses:  Shortness of breath  Hypoxia  Elevated lactic acid level      Note:  This document was prepared using Dragon voice recognition software and may include unintentional dictation errors.    Nance Pear, MD 01/01/22 [phone]

## 2022-01-02 ENCOUNTER — Encounter: Payer: Self-pay | Admitting: Family Medicine

## 2022-01-02 ENCOUNTER — Inpatient Hospital Stay: Payer: Medicare Other

## 2022-01-02 DIAGNOSIS — R6510 Systemic inflammatory response syndrome (SIRS) of non-infectious origin without acute organ dysfunction: Secondary | ICD-10-CM | POA: Diagnosis not present

## 2022-01-02 LAB — BLOOD CULTURE ID PANEL (REFLEXED) - BCID2
A.calcoaceticus-baumannii: NOT DETECTED
Bacteroides fragilis: NOT DETECTED
CTX-M ESBL: NOT DETECTED
Candida albicans: NOT DETECTED
Candida auris: NOT DETECTED
Candida glabrata: NOT DETECTED
Candida krusei: NOT DETECTED
Candida parapsilosis: NOT DETECTED
Candida tropicalis: NOT DETECTED
Carbapenem resist OXA 48 LIKE: NOT DETECTED
Carbapenem resistance IMP: NOT DETECTED
Carbapenem resistance KPC: NOT DETECTED
Carbapenem resistance NDM: NOT DETECTED
Carbapenem resistance VIM: NOT DETECTED
Cryptococcus neoformans/gattii: NOT DETECTED
Enterobacter cloacae complex: NOT DETECTED
Enterobacterales: DETECTED — AB
Enterococcus Faecium: NOT DETECTED
Enterococcus faecalis: NOT DETECTED
Escherichia coli: DETECTED — AB
Haemophilus influenzae: NOT DETECTED
Klebsiella aerogenes: DETECTED — AB
Klebsiella oxytoca: NOT DETECTED
Klebsiella pneumoniae: NOT DETECTED
Listeria monocytogenes: NOT DETECTED
Neisseria meningitidis: NOT DETECTED
Proteus species: NOT DETECTED
Pseudomonas aeruginosa: NOT DETECTED
Salmonella species: NOT DETECTED
Serratia marcescens: NOT DETECTED
Staphylococcus aureus (BCID): NOT DETECTED
Staphylococcus epidermidis: NOT DETECTED
Staphylococcus lugdunensis: NOT DETECTED
Staphylococcus species: NOT DETECTED
Stenotrophomonas maltophilia: NOT DETECTED
Streptococcus agalactiae: NOT DETECTED
Streptococcus pneumoniae: NOT DETECTED
Streptococcus pyogenes: NOT DETECTED
Streptococcus species: NOT DETECTED

## 2022-01-02 LAB — COMPREHENSIVE METABOLIC PANEL
ALT: 21 U/L (ref 0–44)
AST: 44 U/L — ABNORMAL HIGH (ref 15–41)
Albumin: 2.9 g/dL — ABNORMAL LOW (ref 3.5–5.0)
Alkaline Phosphatase: 109 U/L (ref 38–126)
Anion gap: 10 (ref 5–15)
BUN: 27 mg/dL — ABNORMAL HIGH (ref 8–23)
CO2: 20 mmol/L — ABNORMAL LOW (ref 22–32)
Calcium: 8.5 mg/dL — ABNORMAL LOW (ref 8.9–10.3)
Chloride: 104 mmol/L (ref 98–111)
Creatinine, Ser: 1.41 mg/dL — ABNORMAL HIGH (ref 0.61–1.24)
GFR, Estimated: 47 mL/min — ABNORMAL LOW (ref >60)
Glucose, Bld: 164 mg/dL — ABNORMAL HIGH (ref 70–99)
Potassium: 4.1 mmol/L (ref 3.5–5.1)
Sodium: 134 mmol/L — ABNORMAL LOW (ref 135–145)
Total Bilirubin: 1.2 mg/dL (ref 0.3–1.2)
Total Protein: 6.8 g/dL (ref 6.5–8.1)

## 2022-01-02 LAB — URINALYSIS, COMPLETE (UACMP) WITH MICROSCOPIC
Bacteria, UA: NONE SEEN
Bilirubin Urine: NEGATIVE
Glucose, UA: NEGATIVE mg/dL
Ketones, ur: NEGATIVE mg/dL
Leukocytes,Ua: NEGATIVE
Nitrite: NEGATIVE
Protein, ur: 100 mg/dL — AB
Specific Gravity, Urine: 1.04 — ABNORMAL HIGH (ref 1.005–1.030)
pH: 5 (ref 5.0–8.0)

## 2022-01-02 LAB — CBG MONITORING, ED
Glucose-Capillary: 161 mg/dL — ABNORMAL HIGH (ref 70–99)
Glucose-Capillary: 188 mg/dL — ABNORMAL HIGH (ref 70–99)
Glucose-Capillary: 172 mg/dL — ABNORMAL HIGH (ref 70–99)
Glucose-Capillary: 129 mg/dL — ABNORMAL HIGH (ref 70–99)

## 2022-01-02 LAB — CBC
HCT: 31.2 % — ABNORMAL LOW (ref 39.0–52.0)
Hemoglobin: 10.4 g/dL — ABNORMAL LOW (ref 13.0–17.0)
MCH: 28 pg (ref 26.0–34.0)
MCHC: 33.3 g/dL (ref 30.0–36.0)
MCV: 83.9 fL (ref 80.0–100.0)
Platelets: 134 10*3/uL — ABNORMAL LOW (ref 150–400)
RBC: 3.72 MIL/uL — ABNORMAL LOW (ref 4.22–5.81)
RDW: 18.3 % — ABNORMAL HIGH (ref 11.5–15.5)
WBC: 19.2 10*3/uL — ABNORMAL HIGH (ref 4.0–10.5)
nRBC: 0.1 % (ref 0.0–0.2)

## 2022-01-02 LAB — GLUCOSE, CAPILLARY: Glucose-Capillary: 120 mg/dL — ABNORMAL HIGH (ref 70–99)

## 2022-01-02 LAB — LACTIC ACID, PLASMA
Lactic Acid, Venous: 4.2 mmol/L (ref 0.5–1.9)
Lactic Acid, Venous: 1.4 mmol/L (ref 0.5–1.9)

## 2022-01-02 LAB — PROCALCITONIN
Procalcitonin: 7.18 ng/mL
Procalcitonin: 22.03 ng/mL

## 2022-01-02 MED ORDER — SODIUM CHLORIDE 0.9 % IV SOLN
2.0000 g | Freq: Two times a day (BID) | INTRAVENOUS | Status: AC
  Administered 2022-01-02 – 2022-01-08 (×14): 2 g via INTRAVENOUS
  Filled 2022-01-02: qty 12.5
  Filled 2022-01-02: qty 2
  Filled 2022-01-02 (×5): qty 12.5
  Filled 2022-01-02: qty 2
  Filled 2022-01-02: qty 12.5
  Filled 2022-01-02 (×2): qty 2
  Filled 2022-01-02 (×2): qty 12.5
  Filled 2022-01-02: qty 2
  Filled 2022-01-02: qty 12.5

## 2022-01-02 MED ORDER — VANCOMYCIN HCL 1750 MG/350ML IV SOLN: 1750.0000 mg | INTRAVENOUS | Status: DC

## 2022-01-02 MED ORDER — SODIUM CHLORIDE 0.9 % IV SOLN: 2.0000 g | Freq: Two times a day (BID) | INTRAVENOUS | Status: DC

## 2022-01-02 MED ORDER — DIPHENHYDRAMINE HCL 12.5 MG/5ML PO ELIX
12.5000 mg | ORAL_SOLUTION | Freq: Once | ORAL | Status: AC
  Administered 2022-01-02: 12.5 mg via ORAL
  Filled 2022-01-02: qty 5

## 2022-01-02 NOTE — Progress Notes (Signed)
Pharmacy Antibiotic Note  Randy Frazier is a 88 y.o. male admitted on 01/01/2022 with  infection of unknown source .  Pharmacy has been consulted for Vancomycin, Cefepime dosing.  Plan: Cefepime 2 gm IV X 1 given on 11/22 @ 2052. Cefepime 2 gm IV Q12H ordered to start on 11/23 @ 0900.  Vancomycin 1500 mg IV X 1 given in ED on 11/22 @ 2055. Vancomycin 1750 mg IV Q48H ordered to start on 11/24 @ 2100.  AUC = 490 Vanc trough = 9.2   Height: 6' (182.9 cm) Weight: 78 kg (171 lb 15.3 oz) IBW/kg (Calculated) : 77.6  Temp (24hrs), Avg:100.2 F (37.9 C), Min:99.6 F (37.6 C), Max:100.8 F (38.2 C)  Recent Labs  Lab 01/01/22 2007  WBC 15.9*  CREATININE 1.60*  LATICACIDVEN 4.5*    Estimated Creatinine Clearance: 33 mL/min (A) (by C-G formula based on SCr of 1.6 mg/dL (H)).    Allergies  Allergen Reactions   Ibuprofen Swelling   Penicillins Swelling and Hives    lips Other reaction(s): Other (See Comments) Edema (lips)    Antimicrobials this admission:   >>    >>   Dose adjustments this admission:   Microbiology results:  BCx:   UCx:    Sputum:    MRSA PCR:   Thank you for allowing pharmacy to be a part of this patient's care.  Alden Feagan D 01/02/2022 12:49 AM

## 2022-01-02 NOTE — Progress Notes (Signed)
PHARMACY - PHYSICIAN COMMUNICATION CRITICAL VALUE ALERT - BLOOD CULTURE IDENTIFICATION (BCID)  Randy Frazier is an 88 y.o. male who presented to [facility] on 01/01/2022 with a chief complaint of SIRS, infection of unknown source.   Assessment:  E Coli and Kleb  aero growing in 3 of 4 bottles, no resistance detected.  (include suspected source if known)  Name of physician (or Provider) Contacted: Dr Myna Hidalgo   Current antibiotics:  Vanc, Cefepime, Flagyl  Changes to prescribed antibiotics recommended:  Recommendations accepted by provider Will d/c vanc and flagyl and continue cefepime.   Results for orders placed or performed during the hospital encounter of 01/01/22  Blood Culture ID Panel (Reflexed) (Collected: 01/01/2022  8:12 PM)  Result Value Ref Range   Enterococcus faecalis NOT DETECTED NOT DETECTED   Enterococcus Faecium NOT DETECTED NOT DETECTED   Listeria monocytogenes NOT DETECTED NOT DETECTED   Staphylococcus species NOT DETECTED NOT DETECTED   Staphylococcus aureus (BCID) NOT DETECTED NOT DETECTED   Staphylococcus epidermidis NOT DETECTED NOT DETECTED   Staphylococcus lugdunensis NOT DETECTED NOT DETECTED   Streptococcus species NOT DETECTED NOT DETECTED   Streptococcus agalactiae NOT DETECTED NOT DETECTED   Streptococcus pneumoniae NOT DETECTED NOT DETECTED   Streptococcus pyogenes NOT DETECTED NOT DETECTED   A.calcoaceticus-baumannii NOT DETECTED NOT DETECTED   Bacteroides fragilis NOT DETECTED NOT DETECTED   Enterobacterales DETECTED (A) NOT DETECTED   Enterobacter cloacae complex NOT DETECTED NOT DETECTED   Escherichia coli DETECTED (A) NOT DETECTED   Klebsiella aerogenes DETECTED (A) NOT DETECTED   Klebsiella oxytoca NOT DETECTED NOT DETECTED   Klebsiella pneumoniae NOT DETECTED NOT DETECTED   Proteus species NOT DETECTED NOT DETECTED   Salmonella species NOT DETECTED NOT DETECTED   Serratia marcescens NOT DETECTED NOT DETECTED   Haemophilus influenzae NOT  DETECTED NOT DETECTED   Neisseria meningitidis NOT DETECTED NOT DETECTED   Pseudomonas aeruginosa NOT DETECTED NOT DETECTED   Stenotrophomonas maltophilia NOT DETECTED NOT DETECTED   Candida albicans NOT DETECTED NOT DETECTED   Candida auris NOT DETECTED NOT DETECTED   Candida glabrata NOT DETECTED NOT DETECTED   Candida krusei NOT DETECTED NOT DETECTED   Candida parapsilosis NOT DETECTED NOT DETECTED   Candida tropicalis NOT DETECTED NOT DETECTED   Cryptococcus neoformans/gattii NOT DETECTED NOT DETECTED   CTX-M ESBL NOT DETECTED NOT DETECTED   Carbapenem resistance IMP NOT DETECTED NOT DETECTED   Carbapenem resistance KPC NOT DETECTED NOT DETECTED   Carbapenem resistance NDM NOT DETECTED NOT DETECTED   Carbapenem resist OXA 48 LIKE NOT DETECTED NOT DETECTED   Carbapenem resistance VIM NOT DETECTED NOT DETECTED    Mikail Goostree D 01/02/2022  4:49 AM

## 2022-01-02 NOTE — Progress Notes (Addendum)
PROGRESS NOTE    Randy Frazier  ZDG:[id number] DOB: 20-Dec-1927 DOA: 01/01/2022 PCP: Pcp, No    Brief Narrative: This 88 years old male with PMH significant for hypertension, insulin-dependent diabetes mellitus, CAD s/p CABG, history of CVA now presented in the ED with complaints of loss of appetite, shaking chills, upper abdominal pain since yesterday.  Patient has been complaining about some pain in the epigastric associated decreased p.o. intake denies any nausea vomiting.  He was febrile on arrival was given fluid resuscitation.  He was hypoxic requiring 2 L of supplemental oxygen.  CTA ruled out pulmonary embolism.  Blood cultures were collected and patient was admitted for sepsis secondary to unknown cause.  Assessment & Plan:   Principal Problem:   SIRS (systemic inflammatory response syndrome) (HCC) Active Problems:   Epigastric pain   Cirrhosis (HCC)   CAD, AUTOLOGOUS BYPASS GRAFT   DM type 2 with diabetic peripheral neuropathy (HCC)   History of CVA (cerebrovascular accident)   Liver mass   Acute respiratory failure with hypoxia (HCC)   AKI (acute kidney injury) ([place])  Suspected sepsis E. coli/ Klebsiella bacteremia: Patient presented with chills, rigors, loss of appetite, upper abdominal pain and found to have fever with leukocytosis and elevated lactic acid. COVID and influenza PCR are negative and no evidence for infection seen on chest CT  Started on empiric antibiotics and was given IV fluid resuscitation. Lactic acid 4.2, continue IV fluid resuscitation and continue to monitor lactic acid Procalcitonin 7.18 trended up to 22.03, WBC 19.2, UA : unremarkable. Continue current antibiotics Cefepime [place] RUQ: Gallstones and gallbladder sludge. Mild gallbladder wall Thickening.   Blood cultures are growing E. coli, enterococci and Klebsiella.  Acute kidney injury: SCr is 1.60 on admission; baseline appears to be closer to 1.1  Likely prerenal in setting of suspected  sepsis and anorexia with hypovolemia  Continue IVF hydration, hold ACE-i, renally-dose medications,  Serum creatinine improving 1.61> 1.41   Acute hypoxic respiratory failure: He was hypoxic requiring supplemental oxygen. He denies cough and there is no PE or acute pulmonary findings on CTA chest in ED  Continue supplemental O2 as needed and wean as tolerated.   Cirrhosis /Liver masses Recent diagnosis, concerning for hepatocellular carcinoma, had initial GI consult this month and saw oncology for the first time 12/31/21  Continue GI and oncology follow-up on discharge as planned    Diabetes Mellitus HbA1c was 7.3% in October 2023  Continue regular insulin sliding scale   Essential hypertension Continue home medications.   Coronary artery disease- Continue statin and antiplatelets, hold beta-blocker initially given concern for evolving sepsis     History of CVA: Continue statin and antiplatelets   DVT prophylaxis: Heparin sq Code Status:Full code Family Communication:No family at bed side. Disposition Plan:   Status is: Inpatient Remains inpatient appropriate because: Admitted for suspected sepsis secondary to unknown cause.  Continue empiric antibiotics follow-up cultures.   Consultants:  None  Procedures: CTA chest Antimicrobials:  Anti-infectives (From admission, onward)    Start     Dose/Rate Route Frequency Ordered Stop   01/03/22 2100  vancomycin (VANCOREADY) IVPB 1750 mg/350 mL  Status:  Discontinued        1,750 mg 175 mL/hr over 120 Minutes Intravenous Every 48 hours 01/02/22 0049 01/02/22 0053   01/03/22 2100  vancomycin (VANCOREADY) IVPB 1750 mg/350 mL  Status:  Discontinued        1,750 mg 175 mL/hr over 120 Minutes Intravenous Every 48 hours 01/02/22 0053  01/02/22 0449   01/02/22 0900  ceFEPIme (MAXIPIME) 2 g in sodium chloride 0.9 % 100 mL IVPB  Status:  Discontinued        2 g 200 mL/hr over 30 Minutes Intravenous Every 12 hours 01/02/22 0047 01/02/22  0053   01/02/22 0900  ceFEPIme (MAXIPIME) 2 g in sodium chloride 0.9 % 100 mL IVPB        2 g 200 mL/hr over 30 Minutes Intravenous Every 12 hours 01/02/22 0053 01/09/22 0859   01/02/22 0845  metroNIDAZOLE (FLAGYL) IVPB 500 mg  Status:  Discontinued        500 mg 100 mL/hr over 60 Minutes Intravenous Every 12 hours 01/01/22 2320 01/02/22 0448   01/01/22 2045  ceFEPIme (MAXIPIME) 2 g in sodium chloride 0.9 % 100 mL IVPB        2 g 200 mL/hr over 30 Minutes Intravenous  Once 01/01/22 2033 01/01/22 2150   01/01/22 2045  metroNIDAZOLE (FLAGYL) IVPB 500 mg        500 mg 100 mL/hr over 60 Minutes Intravenous  Once 01/01/22 2033 01/01/22 2200   01/01/22 2045  vancomycin (VANCOCIN) IVPB 1000 mg/200 mL premix  Status:  Discontinued        1,000 mg 200 mL/hr over 60 Minutes Intravenous  Once 01/01/22 2033 01/01/22 2037   01/01/22 2045  vancomycin (VANCOREADY) IVPB 1500 mg/300 mL        1,500 mg 150 mL/hr over 120 Minutes Intravenous  Once 01/01/22 2037 01/01/22 2301      Subjective: Patient was seen and examined at bedside.  Overnight events noted.  Patient reports doing better. His blood pressure is improved.  He denies any pain.  Objective: Vitals:   01/02/22 0856 01/02/22 0857 01/02/22 1037 01/02/22 1230  BP: 116/66 116/66  (!) 107/57  Pulse: 88 87 77 77  Resp:  18  20  Temp: 97.6 F (36.4 C)   97.7 F (36.5 C)  TempSrc: Oral   Oral  SpO2: 98% 97% 95% 92%  Weight:      Height:        Intake/Output Summary (Last 24 hours) at 01/02/2022 1459 Last data filed at 01/02/2022 0916 Gross per 24 hour  Intake 1840 ml  Output --  Net 1840 ml   Filed Weights   01/01/22 1956  Weight: 78 kg    Examination:  General exam: Appears comfortable, not in any acute distress.  Deconditioned Respiratory system: CTA bilaterally, no wheezing, no crackles, normal respiratory effort. Cardiovascular system: S1 & S2 heard, regular rate and rhythm, no murmur. Gastrointestinal system: Abdomen is  soft, mildly tender in upper abdomen, no rebound, no guarding.  Bowel sounds+ Central nervous system: Alert and oriented x3 . No focal neurological deficits. Extremities:  No edema, no cyanosis, no clubbing Skin: No rashes, lesions or ulcers Psychiatry: Judgement and insight appear normal. Mood & affect appropriate.     Data Reviewed: I have personally reviewed following labs and imaging studies  CBC: Recent Labs  Lab 01/01/22 2007 01/02/22 0519  WBC 15.9* 19.2*  NEUTROABS 10.2*  --   HGB 11.5* 10.4*  HCT 34.5* 31.2*  MCV 83.7 83.9  PLT 179 161*   Basic Metabolic Panel: Recent Labs  Lab 01/01/22 2007 01/02/22 0519  NA 132* 134*  K 5.4* 4.1  CL 100 104  CO2 21* 20*  GLUCOSE 173* 164*  BUN 26* 27*  CREATININE 1.60* 1.41*  CALCIUM 8.5* 8.5*   GFR: Estimated Creatinine Clearance: 37.5 mL/min (  A) (by C-G formula based on SCr of 1.41 mg/dL (H)). Liver Function Tests: Recent Labs  Lab 01/01/22 2007 01/02/22 0519  AST 47* 44*  ALT 25 21  ALKPHOS 121 109  BILITOT 1.7* 1.2  PROT 7.4 6.8  ALBUMIN 3.1* 2.9*   No results for input(s): "LIPASE", "AMYLASE" in the last 168 hours. No results for input(s): "AMMONIA" in the last 168 hours. Coagulation Profile: Recent Labs  Lab 12/31/21 1409  INR 1.1   Cardiac Enzymes: No results for input(s): "CKTOTAL", "CKMB", "CKMBINDEX", "TROPONINI" in the last 168 hours. BNP (last 3 results) No results for input(s): "PROBNP" in the last 8760 hours. HbA1C: No results for input(s): "HGBA1C" in the last 72 hours. CBG: Recent Labs  Lab 01/02/22 0344 01/02/22 0753 01/02/22 1202  GLUCAP 161* 188* 172*   Lipid Profile: No results for input(s): "CHOL", "HDL", "LDLCALC", "TRIG", "CHOLHDL", "LDLDIRECT" in the last 72 hours. Thyroid Function Tests: No results for input(s): "TSH", "T4TOTAL", "FREET4", "T3FREE", "THYROIDAB" in the last 72 hours. Anemia Panel: No results for input(s): "VITAMINB12", "FOLATE", "FERRITIN", "TIBC", "IRON",  "RETICCTPCT" in the last 72 hours. Sepsis Labs: Recent Labs  Lab 01/01/22 2007 01/02/22 0058 01/02/22 0519  PROCALCITON  --  7.18 22.03  LATICACIDVEN 4.5* 4.2*  --     Recent Results (from the past 240 hour(s))  Blood Culture (routine x 2)     Status: None (Preliminary result)   Collection Time: 01/01/22  8:07 PM   Specimen: BLOOD RIGHT ARM  Result Value Ref Range Status   Specimen Description BLOOD RIGHT ARM  Final   Special Requests   Final    BOTTLES DRAWN AEROBIC AND ANAEROBIC Blood Culture results may not be optimal due to an excessive volume of blood received in culture bottles   Culture  Setup Time   Final    GRAM NEGATIVE RODS IN BOTH AEROBIC AND ANAEROBIC BOTTLES CRITICAL RESULT CALLED TO, READ BACK BY AND VERIFIED WITH: JASON ROBBINS 01/02/2022 AT 0406 SRR Performed at [facility], [address], [place], [place] [address]    Culture GRAM NEGATIVE RODS  Final   Report Status PENDING  Incomplete  Blood Culture (routine x 2)     Status: None (Preliminary result)   Collection Time: 01/01/22  8:12 PM   Specimen: BLOOD LEFT ARM  Result Value Ref Range Status   Specimen Description BLOOD LEFT ARM  Final   Special Requests   Final    Blood Culture results may not be optimal due to an inadequate volume of blood received in culture bottles   Culture  Setup Time   Final    GRAM NEGATIVE RODS IN BOTH AEROBIC AND ANAEROBIC BOTTLES Organism ID to follow CRITICAL RESULT CALLED TO, READ BACK BY AND VERIFIED WITH: JASON ROBBIN 01/02/2022 AT 0406 SRR Performed at [facility], [address] [place], [place] [address]    Culture GRAM NEGATIVE RODS  Final   Report Status PENDING  Incomplete  Blood Culture ID Panel (Reflexed)     Status: Abnormal   Collection Time: 01/01/22  8:12 PM  Result Value Ref Range Status   Enterococcus faecalis NOT DETECTED NOT DETECTED Final   Enterococcus Faecium NOT DETECTED NOT DETECTED Final   Listeria monocytogenes NOT  DETECTED NOT DETECTED Final   Staphylococcus species NOT DETECTED NOT DETECTED Final   Staphylococcus aureus (BCID) NOT DETECTED NOT DETECTED Final   Staphylococcus epidermidis NOT DETECTED NOT DETECTED Final   Staphylococcus lugdunensis NOT DETECTED NOT DETECTED Final  Streptococcus species NOT DETECTED NOT DETECTED Final   Streptococcus agalactiae NOT DETECTED NOT DETECTED Final   Streptococcus pneumoniae NOT DETECTED NOT DETECTED Final   Streptococcus pyogenes NOT DETECTED NOT DETECTED Final   A.calcoaceticus-baumannii NOT DETECTED NOT DETECTED Final   Bacteroides fragilis NOT DETECTED NOT DETECTED Final   Enterobacterales DETECTED (A) NOT DETECTED Final    Comment: CRITICAL RESULT CALLED TO, READ BACK BY AND VERIFIED WITH: JASON ROBBINS 01/02/2022 AT 0406 SRR    Enterobacter cloacae complex NOT DETECTED NOT DETECTED Final   Escherichia coli DETECTED (A) NOT DETECTED Final    Comment: CRITICAL RESULT CALLED TO, READ BACK BY AND VERIFIED WITH: JASON ROBBINS 01/02/2022 AT 0406 SRR    Klebsiella aerogenes DETECTED (A) NOT DETECTED Final    Comment: CRITICAL RESULT CALLED TO, READ BACK BY AND VERIFIED WITH: JASON ROBBINS 01/02/2022 AT 0406 SRR    Klebsiella oxytoca NOT DETECTED NOT DETECTED Final   Klebsiella pneumoniae NOT DETECTED NOT DETECTED Final   Proteus species NOT DETECTED NOT DETECTED Final   Salmonella species NOT DETECTED NOT DETECTED Final   Serratia marcescens NOT DETECTED NOT DETECTED Final   Haemophilus influenzae NOT DETECTED NOT DETECTED Final   Neisseria meningitidis NOT DETECTED NOT DETECTED Final   Pseudomonas aeruginosa NOT DETECTED NOT DETECTED Final   Stenotrophomonas maltophilia NOT DETECTED NOT DETECTED Final   Candida albicans NOT DETECTED NOT DETECTED Final   Candida auris NOT DETECTED NOT DETECTED Final   Candida glabrata NOT DETECTED NOT DETECTED Final   Candida krusei NOT DETECTED NOT DETECTED Final   Candida parapsilosis NOT DETECTED NOT DETECTED  Final   Candida tropicalis NOT DETECTED NOT DETECTED Final   Cryptococcus neoformans/gattii NOT DETECTED NOT DETECTED Final   CTX-M ESBL NOT DETECTED NOT DETECTED Final   Carbapenem resistance IMP NOT DETECTED NOT DETECTED Final   Carbapenem resistance KPC NOT DETECTED NOT DETECTED Final   Carbapenem resistance NDM NOT DETECTED NOT DETECTED Final   Carbapenem resist OXA 48 LIKE NOT DETECTED NOT DETECTED Final   Carbapenem resistance VIM NOT DETECTED NOT DETECTED Final    Comment: Performed at [facility], [address] [place], [place] [address]  Resp Panel by RT-PCR (Flu A&B, Covid) Anterior Nasal Swab     Status: None   Collection Time: 01/01/22  8:15 PM   Specimen: Anterior Nasal Swab  Result Value Ref Range Status   SARS Coronavirus 2 by RT PCR NEGATIVE NEGATIVE Final    Comment: (NOTE) SARS-CoV-2 target nucleic acids are NOT DETECTED.  The SARS-CoV-2 RNA is generally detectable in upper respiratory specimens during the acute phase of infection. The lowest concentration of SARS-CoV-2 viral copies this assay can detect is 138 copies/mL. A negative result does not preclude SARS-Cov-2 infection and should not be used as the sole basis for treatment or other patient management decisions. A negative result may occur with  improper specimen collection/handling, submission of specimen other than nasopharyngeal swab, presence of viral mutation(s) within the areas targeted by this assay, and inadequate number of viral copies(<138 copies/mL). A negative result must be combined with clinical observations, patient history, and epidemiological information. The expected result is Negative.  Fact Sheet for Patients:  EntrepreneurPulse.com.au  Fact Sheet for Healthcare Providers:  IncredibleEmployment.be  This test is no t yet approved or cleared by the [place] FDA and  has been authorized for detection and/or diagnosis of SARS-CoV-2  by FDA under an Emergency Use Authorization (EUA). This EUA will remain  in effect (meaning this  test can be used) for the duration of the COVID-19 declaration under Section 564(b)(1) of the Act, 21 U.S.C.section 360bbb-3(b)(1), unless the authorization is terminated  or revoked sooner.       Influenza A by PCR NEGATIVE NEGATIVE Final   Influenza B by PCR NEGATIVE NEGATIVE Final    Comment: (NOTE) The Xpert Xpress SARS-CoV-2/FLU/RSV plus assay is intended as an aid in the diagnosis of influenza from Nasopharyngeal swab specimens and should not be used as a sole basis for treatment. Nasal washings and aspirates are unacceptable for Xpert Xpress SARS-CoV-2/FLU/RSV testing.  Fact Sheet for Patients: EntrepreneurPulse.com.au  Fact Sheet for Healthcare Providers: IncredibleEmployment.be  This test is not yet approved or cleared by the [place] FDA and has been authorized for detection and/or diagnosis of SARS-CoV-2 by FDA under an Emergency Use Authorization (EUA). This EUA will remain in effect (meaning this test can be used) for the duration of the COVID-19 declaration under Section 564(b)(1) of the Act, 21 U.S.C. section 360bbb-3(b)(1), unless the authorization is terminated or revoked.  Performed at [facility], [address] [place], [place] [address]     Radiology Studies: US Abdomen Limited RUQ (LIVER/GB)  Result Date: 01/02/2022 CLINICAL DATA:  Systemic inflammatory response syndrome. Upper abdominal pain. EXAM: ULTRASOUND ABDOMEN LIMITED RIGHT UPPER QUADRANT COMPARISON:  MRI 12/19/2021 FINDINGS: Gallbladder: Stones are identified within the gallbladder as noted recent MRI. Tumefactive sludge is noted on today's study. No pericholecystic fluid or sonographic Murphy's sign. Mild gallbladder wall thickening measuring 3.3 mm. Common bile duct: Diameter: 4.5 mm Liver: The liver appears heterogeneous with a lobular contour  concerning for cirrhosis. Scattered hypoechoic areas are noted concerning for multifocal liver low metastasis versus hepatomas. Portal vein is patent on color Doppler imaging with normal direction of blood flow towards the liver. Other: None. IMPRESSION: 1. Gallstones and gallbladder sludge. Mild gallbladder wall thickening. No pericholecystic fluid or sonographic Murphy's sign. 2. Cirrhosis of the liver. 3. Multiple hypoechoic liver lesions suspicious for either metastatic disease or multifocal hepatoma. Electronically Signed   By: Kerby Moors M.D.   On: 01/02/2022 05:33   CT Angio Chest PE W and/or Wo Contrast  Result Date: 01/01/2022 CLINICAL DATA:  Chest pain and hypoxia.  Difficulty swallowing. EXAM: CT ANGIOGRAPHY CHEST WITH CONTRAST TECHNIQUE: Multidetector CT imaging of the chest was performed using the standard protocol during bolus administration of intravenous contrast. Multiplanar CT image reconstructions and MIPs were obtained to evaluate the vascular anatomy. RADIATION DOSE REDUCTION: This exam was performed according to the departmental dose-optimization program which includes automated exposure control, adjustment of the mA and/or kV according to patient size and/or use of iterative reconstruction technique. CONTRAST:  1m OMNIPAQUE IOHEXOL 350 MG/ML SOLN COMPARISON:  12/09/2021 FINDINGS: Cardiovascular: Good opacification of the central and segmental pulmonary arteries. No focal filling defects. No evidence of significant pulmonary embolus. Normal heart size. No pericardial effusions. Normal caliber thoracic aorta. No dissection. Calcification of the aorta and coronary arteries. Postoperative changes consistent with coronary bypass. Mediastinum/Nodes: Thyroid gland is unremarkable. Prominent lymph nodes in the supraclavicular regions and mediastinum. Largest are subcarinal lymph nodes measuring about 12 mm short axis dimension. Appearances are similar to prior study. Nonspecific etiology,  likely reactive. Lungs/Pleura: Atelectasis in the lung bases. No airspace disease or consolidation. No pleural effusions. No pneumothorax. Upper Abdomen: Nodular contour to the liver suggesting hepatic cirrhosis. Small esophageal hiatal hernia. Scattered upper abdominal lymph nodes without pathologic enlargement. Musculoskeletal: Degenerative changes. No destructive bone lesions. Sternotomy wires. Review of  the MIP images confirms the above findings. IMPRESSION: 1. No evidence of significant pulmonary embolus. 2. Aortic atherosclerosis. 3. No evidence of active pulmonary disease. 4. Nonspecific mediastinal lymphadenopathy. No change since prior study. 5. Probable hepatic cirrhosis. Electronically Signed   By: Lucienne Capers M.D.   On: 01/01/2022 22:05   DG Chest Port 1 View  Result Date: 01/01/2022 CLINICAL DATA:  Chest pain EXAM: PORTABLE CHEST 1 VIEW COMPARISON:  12/08/2021 FINDINGS: Prior CABG. Heart and mediastinal contours are within normal limits. No focal opacities or effusions. No acute bony abnormality. IMPRESSION: No active cardiopulmonary disease. Electronically Signed   By: Rolm Baptise M.D.   On: 01/01/2022 21:04    Scheduled Meds:  aspirin EC  81 mg Oral Daily   atorvastatin  10 mg Oral Daily   clopidogrel  75 mg Oral Daily   heparin  5,000 Units Subcutaneous Q8H   insulin aspart  0-5 Units Subcutaneous QHS   insulin aspart  0-6 Units Subcutaneous TID WC   pantoprazole  40 mg Oral Daily   sodium chloride flush  3 mL Intravenous Q12H   Continuous Infusions:  ceFEPime (MAXIPIME) IV Stopped (01/02/22 0916)     LOS: 1 day    Time spent: 50 mins    Jancarlos Thrun, MD [facility]   If 7PM-7AM, please contact night-coverage

## 2022-01-02 NOTE — ED Notes (Signed)
The pt had an episode of urinary incontinence, the pt has been cleaned, changed, placed on a external catheter and reconnected to the cardiac monitor. The pt's linen has also been changed, the pt has been given a warm blanket and yellow socks as he is a high fall risk.

## 2022-01-03 DIAGNOSIS — A4151 Sepsis due to Escherichia coli [E. coli]: Secondary | ICD-10-CM | POA: Diagnosis not present

## 2022-01-03 DIAGNOSIS — R160 Hepatomegaly, not elsewhere classified: Secondary | ICD-10-CM | POA: Diagnosis not present

## 2022-01-03 DIAGNOSIS — Z1619 Resistance to other specified beta lactam antibiotics: Secondary | ICD-10-CM | POA: Diagnosis not present

## 2022-01-03 DIAGNOSIS — R6510 Systemic inflammatory response syndrome (SIRS) of non-infectious origin without acute organ dysfunction: Secondary | ICD-10-CM | POA: Diagnosis not present

## 2022-01-03 DIAGNOSIS — K7460 Unspecified cirrhosis of liver: Secondary | ICD-10-CM | POA: Diagnosis not present

## 2022-01-03 LAB — COMPREHENSIVE METABOLIC PANEL
ALT: 26 U/L (ref 0–44)
AST: 72 U/L — ABNORMAL HIGH (ref 15–41)
Albumin: 2.8 g/dL — ABNORMAL LOW (ref 3.5–5.0)
Alkaline Phosphatase: 87 U/L (ref 38–126)
Anion gap: 8 (ref 5–15)
BUN: 33 mg/dL — ABNORMAL HIGH (ref 8–23)
CO2: 23 mmol/L (ref 22–32)
Calcium: 8.3 mg/dL — ABNORMAL LOW (ref 8.9–10.3)
Chloride: 104 mmol/L (ref 98–111)
Creatinine, Ser: 1.37 mg/dL — ABNORMAL HIGH (ref 0.61–1.24)
GFR, Estimated: 49 mL/min — ABNORMAL LOW (ref >60)
Glucose, Bld: 138 mg/dL — ABNORMAL HIGH (ref 70–99)
Potassium: 4.2 mmol/L (ref 3.5–5.1)
Sodium: 135 mmol/L (ref 135–145)
Total Bilirubin: 0.8 mg/dL (ref 0.3–1.2)
Total Protein: 6.6 g/dL (ref 6.5–8.1)

## 2022-01-03 LAB — CBC
HCT: 29.7 % — ABNORMAL LOW (ref 39.0–52.0)
Hemoglobin: 9.9 g/dL — ABNORMAL LOW (ref 13.0–17.0)
MCH: 27.9 pg (ref 26.0–34.0)
MCHC: 33.3 g/dL (ref 30.0–36.0)
MCV: 83.7 fL (ref 80.0–100.0)
Platelets: 109 10*3/uL — ABNORMAL LOW (ref 150–400)
RBC: 3.55 MIL/uL — ABNORMAL LOW (ref 4.22–5.81)
RDW: 18 % — ABNORMAL HIGH (ref 11.5–15.5)
WBC: 23 10*3/uL — ABNORMAL HIGH (ref 4.0–10.5)
nRBC: 0 % (ref 0.0–0.2)

## 2022-01-03 LAB — PROCALCITONIN: Procalcitonin: 21.59 ng/mL

## 2022-01-03 LAB — GLUCOSE, CAPILLARY
Glucose-Capillary: 138 mg/dL — ABNORMAL HIGH (ref 70–99)
Glucose-Capillary: 132 mg/dL — ABNORMAL HIGH (ref 70–99)
Glucose-Capillary: 166 mg/dL — ABNORMAL HIGH (ref 70–99)
Glucose-Capillary: 199 mg/dL — ABNORMAL HIGH (ref 70–99)

## 2022-01-03 LAB — CELIAC DISEASE PANEL
Endomysial Ab, IgA: NEGATIVE
IgA: 645 mg/dL — ABNORMAL HIGH (ref 61–437)
Tissue Transglutaminase Ab, IgA: <2 U/mL (ref 0–3)

## 2022-01-03 NOTE — TOC Initial Note (Signed)
Transition of Care [facility]) - Initial/Assessment Note    Patient Details  Name: Randy Frazier MRN: [id number] Date of Birth: [birth year]  Transition of Care [facility]) CM/SW Contact:    Magnus Ivan, LCSW Phone Number: 01/03/2022, 9:38 AM  Clinical Narrative:                 CSW completed high readmission risk assessment with patient. Patient lives alone with his cat Misha and drives himself to appointments.  Patient states he does not have a PCP but does see Cardiology, Endocrinology, and Podiatry regularly.  Pharmacy is [facility].  No DME or HH history. Patient states he is very independent.  Patient states his son will pick him up at time of DC.   Expected Discharge Plan: Home/Self Care Barriers to Discharge: Continued Medical Work up   Patient Goals and CMS Choice Patient states their goals for this hospitalization and ongoing recovery are:: to return home CMS Medicare.gov Compare Post Acute Care list provided to:: Patient Choice offered to / list presented to : Patient  Expected Discharge Plan and Services Expected Discharge Plan: Home/Self Care       Living arrangements for the past 2 months: Single Family Home                                      Prior Living Arrangements/Services Living arrangements for the past 2 months: Single Family Home Lives with:: Self Patient language and need for interpreter reviewed:: Yes Do you feel safe going back to the place where you live?: Yes      Need for Family Participation in Patient Care: Yes (Comment) Care giver support system in place?: Yes (comment)   Criminal Activity/Legal Involvement Pertinent to Current Situation/Hospitalization: No - Comment as needed  Activities of Daily Living Home Assistive Devices/Equipment: Eyeglasses ADL Screening (condition at time of admission) Patient's cognitive ability adequate to safely complete daily activities?: Yes Is the patient deaf or have difficulty hearing?:  No Does the patient have difficulty seeing, even when wearing glasses/contacts?: No Does the patient have difficulty concentrating, remembering, or making decisions?: No Patient able to express need for assistance with ADLs?: Yes Does the patient have difficulty dressing or bathing?: Yes Independently performs ADLs?: No Communication: Independent Dressing (OT): Needs assistance Is this a change from baseline?: Change from baseline, expected to last <3days Grooming: Independent Feeding: Independent Bathing: Needs assistance Is this a change from baseline?: Change from baseline, expected to last <3 days Toileting: Needs assistance Is this a change from baseline?: Pre-admission baseline In/Out Bed: Needs assistance Is this a change from baseline?: Pre-admission baseline Walks in Home: Needs assistance Is this a change from baseline?: Pre-admission baseline Does the patient have difficulty walking or climbing stairs?: Yes Weakness of Legs: Both Weakness of Arms/Hands: None  Permission Sought/Granted                  Emotional Assessment       Orientation: : Oriented to Self, Oriented to Place, Oriented to  Time, Oriented to Situation Alcohol / Substance Use: Not Applicable Psych Involvement: No (comment)  Admission diagnosis:  Shortness of breath [R06.02] Hypoxia [R09.02] SIRS (systemic inflammatory response syndrome) (HCC) [R65.10] Elevated lactic acid level [R79.89] Patient Active Problem List   Diagnosis Date Noted   SIRS (systemic inflammatory response syndrome) ([place]) 01/01/2022   Liver mass 01/01/2022   Acute respiratory failure with hypoxia ([place])  01/01/2022   AKI (acute kidney injury) ([place]) 01/01/2022   Cirrhosis ([place]) 12/09/2021   Epigastric pain 12/08/2021   History of CVA (cerebrovascular accident) 12/08/2021   Left flank pain 12/08/2021   Colonic diverticular abscess 03/30/2020   Periorbital hematoma 05/07/2016   Acute kidney failure, unspecified ([place])  05/07/2016   Chronic sinusitis 05/07/2016   Hypertrophy of prostate with urinary obstruction and other lower urinary tract symptoms (LUTS) 05/07/2016   Benign prostatic hyperplasia with urinary obstruction 08/17/2015   Controlled type 2 diabetes mellitus without complication ([place]) 31/54/0086   Psoriasis 08/16/2015   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   DM type 2 with diabetic peripheral neuropathy ([place]) 08/10/2015   Cerebrovascular accident (CVA) ([place]) 08/18/2012   Gout 08/18/2012   Depressive disorder, not elsewhere classified 07/31/2011   BPH (benign prostatic hyperplasia) 06/19/2011   Hypertension 06/19/2011   Diabetes mellitus type II, controlled ([place]) 06/19/2010   Hyperlipidemia 12/11/2009   HYPERTENSION, BENIGN 12/11/2009   Coronary atherosclerosis 12/11/2009   CAD, AUTOLOGOUS BYPASS GRAFT 12/11/2009   PCP:  Pcp, No Pharmacy:   [facility], [address] [place] [address] Phone: [phone] Fax: [phone]  [facility] [address] [place] [address] Phone: [phone] Fax: [id number] [address]on, [address] [place] [address] Phone: [phone] Fax: [place], [place] [address] [place] [address] Phone: [phone] Fax: [phone]  [place], [place] [address] [place] [address] Phone: [phone] Fax: [phone]     Social Determinants of Health (SDOH) Interventions    Readmission Risk Interventions    01/03/2022    9:38 AM  Readmission Risk Prevention Plan  Transportation Screening Complete  PCP or Specialist Appt within 3-5 Days Complete  HRI or [place] Complete  Social Work Consult for [place]  Planning/Counseling Complete  Palliative Care Screening Not Applicable  Medication Review Press photographer) Complete

## 2022-01-03 NOTE — Consult Note (Signed)
[facility] SURGICAL CONSULTATION NOTE (initial) - cpt: 99254   HISTORY OF PRESENT ILLNESS (HPI):  88 y.o. male presented to [facility] ED 2 days ago for evaluation of suspected sepsis. Patient reports a prior history of bandlike pain across to his costal margin, with some lingering epigastric tenderness associated with rigors anorexia, fevers leukocytosis and elevated lactate on admission.  Blood cultures were obtained which were positive for enteric derived organisms. He also reports he had a significant episode of diverticulitis with intramural abscess in February 2022, and questions if this may be also a source of his positive blood cultures. Right upper quadrant ultrasound was not clinically diagnostic for acute cholecystitis.  MRI of the abdomen shows evidence of a liver mass consistent with hepatocellular carcinoma, he has not had percutaneous biopsies at this point.  Surgery is consulted by hospitalist physician Dr. Dwyane Dee in this context for evaluation and management of possible acute cholecystitis, in light of persisting leukocytosis despite clinical improvement.  PAST MEDICAL HISTORY (PMH):  Past Medical History:  Diagnosis Date   Actinic keratosis    Arrhythmia    A-Fib   BPH (benign prostatic hyperplasia)    Coronary artery disease 02/11/2007   CABG X 3  @ [facility]   CVA (cerebral infarction) 02/11/2012   Diabetes mellitus without complication (HCC)    Enlarged prostate    GERD (gastroesophageal reflux disease)    Hyperlipidemia    Hypertension    Kidney stone    Renal disorder    Skin cancer    scalp, tx by Dr Sharlett Iles 2000s?     PAST SURGICAL HISTORY ([place]):  Past Surgical History:  Procedure Laterality Date   cataract surgery Bilateral    CORONARY ARTERY BYPASS GRAFT  2009   CABG X 3 @ [facility]   TEAR DUCT PROBING Right 05/07/2016   Procedure: Evacuation of hematoma, right peri-orbital bleed.;  Surgeon: Clista Bernhardt, MD;  Location: [facility];  Service:  Ophthalmology;  Laterality: Right;     MEDICATIONS:  Prior to Admission medications   Medication Sig Start Date End Date Taking? Authorizing Provider  amLODipine (NORVASC) 10 MG tablet TAKE 1 TABLET BY MOUTH EVERY DAY 10/03/12  Yes Gollan, Kathlene November, MD  aspirin EC 81 MG tablet Take 81 mg by mouth.   Yes [provider]  atorvastatin (LIPITOR) 10 MG tablet Take 1 tablet (10 mg total) by mouth every other day. 12/05/19  Yes Minna Merritts, MD  clopidogrel (PLAVIX) 75 MG tablet TAKE 1 TABLET(75 MG) BY MOUTH DAILY 08/20/17  Yes Gollan, Kathlene November, MD  ezetimibe (ZETIA) 10 MG tablet Take 10 mg by mouth daily.   Yes [provider]  FLUoxetine (PROZAC) 20 MG capsule Take 20 mg by mouth daily.   Yes [provider]  gabapentin (NEURONTIN) 300 MG capsule Take 300 mg by mouth 3 (three) times daily.  01/24/12  Yes [provider]  insulin detemir (LEVEMIR) 100 UNIT/ML injection Inject 45 Units into the skin daily.   Yes [provider]  lisinopril (PRINIVIL,ZESTRIL) 20 MG tablet Take 20 mg by mouth daily.  01/27/13  Yes [provider]  Magnesium 500 MG TABS Take 500 mg by mouth daily.   Yes [provider]  metFORMIN (GLUCOPHAGE-XR) 500 MG 24 hr tablet Take 500 mg by mouth 2 (two) times daily. 12/12/21  Yes [provider]  metoprolol tartrate (LOPRESSOR) 25 MG tablet Take 25 mg by mouth 2 (two) times daily.   Yes [provider]  omeprazole (PRILOSEC) 40 MG capsule Take 40 mg by mouth daily.   Yes [provider]  insulin lispro (HUMALOG) 100 UNIT/ML KwikPen 10 Units 2 (two) times daily with a meal. Patient not taking: Reported on 12/31/2021 04/17/21   [provider]  LEVEMIR FLEXPEN 100 UNIT/ML FlexPen Inject 18 Units into the skin at bedtime. Patient not taking: Reported on 01/02/2022 12/09/21   Loletha Grayer, MD     ALLERGIES:  Allergies  Allergen Reactions   Ibuprofen Swelling   Penicillins  Swelling and Hives    lips Other reaction(s): Other (See Comments) Edema (lips)     SOCIAL HISTORY:  Social History   Socioeconomic History   Marital status: Widowed    Spouse name: Not on file   Number of children: Not on file   Years of education: Not on file   Highest education level: Not on file  Occupational History   Not on file  Tobacco Use   Smoking status: Never   Smokeless tobacco: Never  Vaping Use   Vaping Use: Never used  Substance and Sexual Activity   Alcohol use: No    Comment: occasional   Drug use: No   Sexual activity: Not Currently  Other Topics Concern   Not on file  Social History Narrative   Not on file   Social Determinants of Health   Financial Resource Strain: Not on file  Food Insecurity: No Food Insecurity (01/02/2022)   Hunger Vital Sign    Worried About Running Out of Food in the Last Year: Never true    Ran Out of Food in the Last Year: Never true  Transportation Needs: No Transportation Needs (01/02/2022)   PRAPARE - Hydrologist (Medical): No    Lack of Transportation (Non-Medical): No  Physical Activity: Not on file  Stress: Not on file  Social Connections: Not on file  Intimate Partner Violence: Not At Risk (01/02/2022)   Humiliation, Afraid, Rape, and Kick questionnaire    Fear of Current or Ex-Partner: No    Emotionally Abused: No    Physically Abused: No    Sexually Abused: No     FAMILY HISTORY:  Family History  Family history unknown: Yes      REVIEW OF SYSTEMS:  ROS  VITAL SIGNS:  Temp:  [97.6 F (36.4 C)-98.5 F (36.9 C)] 98.5 F (36.9 C) (11/24 1551) Pulse Rate:  [65-75] 75 (11/24 1551) Resp:  [16-18] 18 (11/24 1551) BP: (117-154)/(60-73) 154/73 (11/24 1551) SpO2:  [94 %-98 %] 98 % (11/24 1551)     Height: 6' (182.9 cm) Weight: 78 kg BMI (Calculated): 23.32   INTAKE/OUTPUT:  11/23 0701 - 11/24 0700 In: 100 [IV Piggyback:100] Out: 200 [Urine:200]  PHYSICAL EXAM:   Physical Exam Blood pressure (!) 154/73, pulse 75, temperature 98.5 F (36.9 C), temperature source Oral, resp. rate 18, height 6' (1.829 m), weight 78 kg, SpO2 98 %. Last Weight  Most recent update: 01/01/2022  7:56 PM    Weight  78 kg (171 lb 15.3 oz)             CONSTITUTIONAL: Well developed, and nourished, appropriately responsive and aware without distress.   EYES: Sclera non-icteric.   EARS, NOSE, MOUTH AND THROAT:  The oropharynx is clear. Oral mucosa is pink and moist.    Hearing is intact to voice.  NECK: Trachea is midline, and there is no jugular venous distension.  LYMPH NODES:  Lymph nodes in the neck are  not enlarged. RESPIRATORY:  Normal respiratory effort without pathologic use of accessory muscles. CARDIOVASCULAR: Heart is regular in rate and rhythm. GI: The abdomen is notable for some mild epigastric tenderness, otherwise soft, nontender, and nondistended. There were no obvious masses.  MUSCULOSKELETAL:  Symmetrical muscle tone appreciated in all four extremities.    SKIN: Skin turgor is normal. No pathologic skin lesions appreciated.  NEUROLOGIC:  Motor and sensation appear grossly normal.  Cranial nerves are grossly without defect. PSYCH:  Alert and oriented to person, place and time. Affect is appropriate for situation.  Data Reviewed I have personally reviewed what is currently available of the patient's imaging, recent labs and medical records.    Labs:     Latest Ref Rng & Units 01/03/2022    4:06 AM 01/02/2022    5:19 AM 01/01/2022    8:07 PM  CBC  WBC 4.0 - 10.5 K/uL 23.0  19.2  15.9   Hemoglobin 13.0 - 17.0 g/dL 9.9  10.4  11.5   Hematocrit 39.0 - 52.0 % 29.7  31.2  34.5   Platelets 150 - 400 K/uL 109  134  179       Latest Ref Rng & Units 01/03/2022    4:06 AM 01/02/2022    5:19 AM 01/01/2022    8:07 PM  CMP  Glucose 70 - 99 mg/dL 138  164  173   BUN 8 - 23 mg/dL 33  27  26   Creatinine 0.61 - 1.24 mg/dL 1.37  1.41  1.60   Sodium 135 -  145 mmol/L 135  134  132   Potassium 3.5 - 5.1 mmol/L 4.2  4.1  5.4   Chloride 98 - 111 mmol/L 104  104  100   CO2 22 - 32 mmol/L '23  20  21   '$ Calcium 8.9 - 10.3 mg/dL 8.3  8.5  8.5   Total Protein 6.5 - 8.1 g/dL 6.6  6.8  7.4   Total Bilirubin 0.3 - 1.2 mg/dL 0.8  1.2  1.7   Alkaline Phos 38 - 126 U/L 87  109  121   AST 15 - 41 U/L 72  44  47   ALT 0 - 44 U/L '26  21  25      '$ Imaging studies:   Last 24 hrs: No results found.   Assessment/Plan:  88 y.o. male with persisting leukocytosis following clinical improvement of a septic picture, blood cultures consistent with significant source, however uncertain complicated by pertinent comorbidities including:  Patient Active Problem List   Diagnosis Date Noted   SIRS (systemic inflammatory response syndrome) ([place]) 01/01/2022   Liver mass 01/01/2022   Acute respiratory failure with hypoxia ([place]) 01/01/2022   AKI (acute kidney injury) ([place]) 01/01/2022   Cirrhosis ([place]) 12/09/2021   Epigastric pain 12/08/2021   History of CVA (cerebrovascular accident) 12/08/2021   Left flank pain 12/08/2021   Colonic diverticular abscess 03/30/2020   Periorbital hematoma 05/07/2016   Acute kidney failure, unspecified ([place]) 05/07/2016   Chronic sinusitis 05/07/2016   Hypertrophy of prostate with urinary obstruction and other lower urinary tract symptoms (LUTS) 05/07/2016   Benign prostatic hyperplasia with urinary obstruction 08/17/2015   Controlled type 2 diabetes mellitus without complication ([place]) 80/16/5537   Psoriasis 08/16/2015   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   DM type 2 with diabetic peripheral neuropathy ([place]) 08/10/2015   Cerebrovascular accident (CVA) ([place]) 08/18/2012   Gout 08/18/2012   Depressive disorder, not elsewhere classified 07/31/2011   BPH (  benign prostatic hyperplasia) 06/19/2011   Hypertension 06/19/2011   Diabetes mellitus type II, controlled ([place]) 06/19/2010   Hyperlipidemia 12/11/2009   HYPERTENSION, BENIGN 12/11/2009    Coronary atherosclerosis 12/11/2009   CAD, AUTOLOGOUS BYPASS GRAFT 12/11/2009    -HIDA scan will determine if this is acute cholecystitis or not.  -I discussed with the patient options of surgery versus percutaneous drainage, encouraging percutaneous drainage.  Especially in light of the suspected liver disease.  -He is suggested an alternative source might be diverticulitis, and I note he has not had a CT scan this admission of his abdomen and pelvis.  Though he has not been symptomatic in this region to suggest this either.  - DVT prophylaxis  -I will follow-up after the HIDA scan.  Interventional radiology may want to heads up for possible PERC drainage.   All of the above findings and recommendations were discussed with the patient and  family(if present), and all of patient's and present family's questions were answered to their expressed satisfaction.  Thank you for the opportunity to participate in this patient's care.   -- Ronny Bacon, M.D., FACS 01/03/2022, 4:24 PM

## 2022-01-03 NOTE — Progress Notes (Signed)
PROGRESS NOTE    PETR BONTEMPO  KGM:[id number] DOB: 03-11-1927 DOA: 01/01/2022 PCP: Pcp, No    Brief Narrative: This 88 years old male with PMH significant for hypertension, insulin-dependent diabetes mellitus, CAD s/p CABG, history of CVA now presented in the ED with complaints of loss of appetite, shaking chills, upper abdominal pain since yesterday.  Patient has been complaining about some pain in the epigastric associated decreased p.o. intake denies any nausea vomiting.  He was febrile on arrival was given fluid resuscitation.  He was hypoxic requiring 2 L of supplemental oxygen.  CTA ruled out pulmonary embolism.  Blood cultures were collected and patient was admitted for sepsis secondary to unknown cause.  Assessment & Plan:   Principal Problem:   SIRS (systemic inflammatory response syndrome) (HCC) Active Problems:   Epigastric pain   Cirrhosis (HCC)   CAD, AUTOLOGOUS BYPASS GRAFT   DM type 2 with diabetic peripheral neuropathy (HCC)   History of CVA (cerebrovascular accident)   Liver mass   Acute respiratory failure with hypoxia (HCC)   AKI (acute kidney injury) ([place])  Suspected sepsis E. coli/ Klebsiella bacteremia: Patient presented with chills, rigors, loss of appetite, upper abdominal pain and found to have fever with leukocytosis and elevated lactic acid. COVID and influenza PCR are negative and no evidence for infection seen on chest CT  Started on empiric antibiotics and was given IV fluid resuscitation. Lactic acid 4.2, improved with fluid resuscitation Procalcitonin 7.18 trended up to 22.03, WBC 19.2, UA : unremarkable. Continue current antibiotics Cefepime [place] RUQ: Gallstones and gallbladder sludge. Mild gallbladder wall Thickening.   Blood cultures are growing E. coli, enterococci and Klebsiella.  Acute kidney injury: SCr is 1.60 on admission; baseline appears to be closer to 1.1  Likely prerenal in setting of suspected sepsis and anorexia with  hypovolemia  Continue IVF hydration, hold ACE-i, renally-dose medications,  Serum creatinine improving 1.61> 1.41 >1.37   Acute hypoxic respiratory failure: He was hypoxic requiring supplemental oxygen. He denies cough and there is no PE or acute pulmonary findings on CTA chest in ED  Continue supplemental O2 as needed and wean as tolerated.   Cirrhosis /Liver masses Recent diagnosis, concerning for hepatocellular carcinoma, had initial GI consult this month and saw oncology for the first time 12/31/21  Continue GI and oncology follow-up on discharge as planned    Diabetes Mellitus HbA1c was 7.3% in October 2023  Continue regular insulin sliding scale   Essential hypertension Continue home medications.   Coronary artery disease- Continue statin and antiplatelets, hold beta-blocker initially given concern for evolving sepsis     History of CVA: Continue statin and antiplatelets   DVT prophylaxis: Heparin sq Code Status:Full code Family Communication: No family at bed side. Disposition Plan:   Status is: Inpatient Remains inpatient appropriate because: Admitted for suspected sepsis secondary to unknown cause.  Continue empiric antibiotics follow-up cultures.   Anticipated discharge home 01/04/2022.   Consultants:  None  Procedures: CTA chest Antimicrobials:  Anti-infectives (From admission, onward)    Start     Dose/Rate Route Frequency Ordered Stop   01/03/22 2100  vancomycin (VANCOREADY) IVPB 1750 mg/350 mL  Status:  Discontinued        1,750 mg 175 mL/hr over 120 Minutes Intravenous Every 48 hours 01/02/22 0049 01/02/22 0053   01/03/22 2100  vancomycin (VANCOREADY) IVPB 1750 mg/350 mL  Status:  Discontinued        1,750 mg 175 mL/hr over 120 Minutes Intravenous Every 48 hours  01/02/22 0053 01/02/22 0449   01/02/22 0900  ceFEPIme (MAXIPIME) 2 g in sodium chloride 0.9 % 100 mL IVPB  Status:  Discontinued        2 g 200 mL/hr over 30 Minutes Intravenous Every 12  hours 01/02/22 0047 01/02/22 0053   01/02/22 0900  ceFEPIme (MAXIPIME) 2 g in sodium chloride 0.9 % 100 mL IVPB        2 g 200 mL/hr over 30 Minutes Intravenous Every 12 hours 01/02/22 0053 01/09/22 0859   01/02/22 0845  metroNIDAZOLE (FLAGYL) IVPB 500 mg  Status:  Discontinued        500 mg 100 mL/hr over 60 Minutes Intravenous Every 12 hours 01/01/22 2320 01/02/22 0448   01/01/22 2045  ceFEPIme (MAXIPIME) 2 g in sodium chloride 0.9 % 100 mL IVPB        2 g 200 mL/hr over 30 Minutes Intravenous  Once 01/01/22 2033 01/01/22 2150   01/01/22 2045  metroNIDAZOLE (FLAGYL) IVPB 500 mg        500 mg 100 mL/hr over 60 Minutes Intravenous  Once 01/01/22 2033 01/01/22 2200   01/01/22 2045  vancomycin (VANCOCIN) IVPB 1000 mg/200 mL premix  Status:  Discontinued        1,000 mg 200 mL/hr over 60 Minutes Intravenous  Once 01/01/22 2033 01/01/22 2037   01/01/22 2045  vancomycin (VANCOREADY) IVPB 1500 mg/300 mL        1,500 mg 150 mL/hr over 120 Minutes Intravenous  Once 01/01/22 2037 01/01/22 2301      Subjective: Patient was seen and examined at bedside.  Overnight events noted.   Patient reports doing much better, blood pressure has improved.   He denies any pain,  he asked when he can be discharged.  Objective: Vitals:   01/02/22 2034 01/03/22 0540 01/03/22 0803 01/03/22 1131  BP: 122/68 136/66 132/71 (!) 147/60  Pulse: 74 73 72 65  Resp: '18 17 18 18  '$ Temp: 97.8 F (36.6 C) 98.3 F (36.8 C) 98.2 F (36.8 C) 98.2 F (36.8 C)  TempSrc: Oral Oral Oral Oral  SpO2: 94% 95% 98% 98%  Weight:      Height:        Intake/Output Summary (Last 24 hours) at 01/03/2022 1327 Last data filed at 01/03/2022 0900 Gross per 24 hour  Intake 240 ml  Output 200 ml  Net 40 ml   Filed Weights   01/01/22 1956  Weight: 78 kg    Examination:  General exam: Appears comfortable, not in any acute distress.  Deconditioned Respiratory system: CTA bilaterally, no wheezing, no crackles, normal  respiratory effort. Cardiovascular system: S1 & S2 heard, regular rate and rhythm, no murmur. Gastrointestinal system: Abdomen is soft, mildly distended in the upper abdomen, no rebound, no guarding, bowel sounds+ Central nervous system: Alert and oriented x 3 . No focal neurological deficits. Extremities:  No edema, no cyanosis, no clubbing Skin: No rashes, lesions or ulcers Psychiatry: Judgement and insight appear normal. Mood & affect appropriate.     Data Reviewed: I have personally reviewed following labs and imaging studies  CBC: Recent Labs  Lab 01/01/22 2007 01/02/22 0519 01/03/22 0406  WBC 15.9* 19.2* 23.0*  NEUTROABS 10.2*  --   --   HGB 11.5* 10.4* 9.9*  HCT 34.5* 31.2* 29.7*  MCV 83.7 83.9 83.7  PLT 179 134* 616*   Basic Metabolic Panel: Recent Labs  Lab 01/01/22 2007 01/02/22 0519 01/03/22 0406  NA 132* 134* 135  K 5.4*  4.1 4.2  CL 100 104 104  CO2 21* 20* 23  GLUCOSE 173* 164* 138*  BUN 26* 27* 33*  CREATININE 1.60* 1.41* 1.37*  CALCIUM 8.5* 8.5* 8.3*   GFR: Estimated Creatinine Clearance: 38.5 mL/min (A) (by C-G formula based on SCr of 1.37 mg/dL (H)). Liver Function Tests: Recent Labs  Lab 01/01/22 2007 01/02/22 0519 01/03/22 0406  AST 47* 44* 72*  ALT '25 21 26  '$ ALKPHOS 121 109 87  BILITOT 1.7* 1.2 0.8  PROT 7.4 6.8 6.6  ALBUMIN 3.1* 2.9* 2.8*   No results for input(s): "LIPASE", "AMYLASE" in the last 168 hours. No results for input(s): "AMMONIA" in the last 168 hours. Coagulation Profile: Recent Labs  Lab 12/31/21 1409  INR 1.1   Cardiac Enzymes: No results for input(s): "CKTOTAL", "CKMB", "CKMBINDEX", "TROPONINI" in the last 168 hours. BNP (last 3 results) No results for input(s): "PROBNP" in the last 8760 hours. HbA1C: No results for input(s): "HGBA1C" in the last 72 hours. CBG: Recent Labs  Lab 01/02/22 1202 01/02/22 1713 01/02/22 2139 01/03/22 0800 01/03/22 1127  GLUCAP 172* 129* 120* 138* 132*   Lipid Profile: No  results for input(s): "CHOL", "HDL", "LDLCALC", "TRIG", "CHOLHDL", "LDLDIRECT" in the last 72 hours. Thyroid Function Tests: No results for input(s): "TSH", "T4TOTAL", "FREET4", "T3FREE", "THYROIDAB" in the last 72 hours. Anemia Panel: No results for input(s): "VITAMINB12", "FOLATE", "FERRITIN", "TIBC", "IRON", "RETICCTPCT" in the last 72 hours. Sepsis Labs: Recent Labs  Lab 01/01/22 2007 01/02/22 0058 01/02/22 0519 01/02/22 1710 01/03/22 0406  PROCALCITON  --  7.18 22.03  --  21.59  LATICACIDVEN 4.5* 4.2*  --  1.4  --     Recent Results (from the past 240 hour(s))  Blood Culture (routine x 2)     Status: None (Preliminary result)   Collection Time: 01/01/22  8:07 PM   Specimen: BLOOD RIGHT ARM  Result Value Ref Range Status   Specimen Description BLOOD RIGHT ARM  Final   Special Requests   Final    BOTTLES DRAWN AEROBIC AND ANAEROBIC Blood Culture results may not be optimal due to an excessive volume of blood received in culture bottles   Culture  Setup Time   Final    GRAM NEGATIVE RODS IN BOTH AEROBIC AND ANAEROBIC BOTTLES CRITICAL RESULT CALLED TO, READ BACK BY AND VERIFIED WITH: JASON ROBBINS 01/02/2022 AT 0406 SRR Performed at [facility], [address], [place], [place] [address]    Culture GRAM NEGATIVE RODS  Final   Report Status PENDING  Incomplete  Blood Culture (routine x 2)     Status: Abnormal (Preliminary result)   Collection Time: 01/01/22  8:12 PM   Specimen: BLOOD LEFT ARM  Result Value Ref Range Status   Specimen Description   Final    BLOOD LEFT ARM Performed at [facility], [address], [place], [place] [address]    Special Requests   Final    Blood Culture results may not be optimal due to an inadequate volume of blood received in culture bottles Performed at [facility], [address] [place], [place] [address]    Culture  Setup Time   Final    GRAM NEGATIVE RODS IN BOTH AEROBIC AND ANAEROBIC  BOTTLES Organism ID to follow CRITICAL RESULT CALLED TO, READ BACK BY AND VERIFIED WITH: Romualdo Bolk 01/02/2022 AT 0406 SRR Performed at [facility], [address] [place], [place] [address]    Culture (A)  Final  ESCHERICHIA COLI CULTURE REINCUBATED FOR BETTER GROWTH Performed at [facility], [place] [address] [place], [place] [address]    Report Status PENDING  Incomplete  Blood Culture ID Panel (Reflexed)     Status: Abnormal   Collection Time: 01/01/22  8:12 PM  Result Value Ref Range Status   Enterococcus faecalis NOT DETECTED NOT DETECTED Final   Enterococcus Faecium NOT DETECTED NOT DETECTED Final   Listeria monocytogenes NOT DETECTED NOT DETECTED Final   Staphylococcus species NOT DETECTED NOT DETECTED Final   Staphylococcus aureus (BCID) NOT DETECTED NOT DETECTED Final   Staphylococcus epidermidis NOT DETECTED NOT DETECTED Final   Staphylococcus lugdunensis NOT DETECTED NOT DETECTED Final   Streptococcus species NOT DETECTED NOT DETECTED Final   Streptococcus agalactiae NOT DETECTED NOT DETECTED Final   Streptococcus pneumoniae NOT DETECTED NOT DETECTED Final   Streptococcus pyogenes NOT DETECTED NOT DETECTED Final   A.calcoaceticus-baumannii NOT DETECTED NOT DETECTED Final   Bacteroides fragilis NOT DETECTED NOT DETECTED Final   Enterobacterales DETECTED (A) NOT DETECTED Final    Comment: CRITICAL RESULT CALLED TO, READ BACK BY AND VERIFIED WITH: JASON ROBBINS 01/02/2022 AT 0406 SRR    Enterobacter cloacae complex NOT DETECTED NOT DETECTED Final   Escherichia coli DETECTED (A) NOT DETECTED Final    Comment: CRITICAL RESULT CALLED TO, READ BACK BY AND VERIFIED WITH: JASON ROBBINS 01/02/2022 AT 0406 SRR    Klebsiella aerogenes DETECTED (A) NOT DETECTED Final    Comment: CRITICAL RESULT CALLED TO, READ BACK BY AND VERIFIED WITH: JASON ROBBINS 01/02/2022 AT 0406 SRR    Klebsiella oxytoca NOT DETECTED NOT DETECTED Final   Klebsiella pneumoniae NOT  DETECTED NOT DETECTED Final   Proteus species NOT DETECTED NOT DETECTED Final   Salmonella species NOT DETECTED NOT DETECTED Final   Serratia marcescens NOT DETECTED NOT DETECTED Final   Haemophilus influenzae NOT DETECTED NOT DETECTED Final   Neisseria meningitidis NOT DETECTED NOT DETECTED Final   Pseudomonas aeruginosa NOT DETECTED NOT DETECTED Final   Stenotrophomonas maltophilia NOT DETECTED NOT DETECTED Final   Candida albicans NOT DETECTED NOT DETECTED Final   Candida auris NOT DETECTED NOT DETECTED Final   Candida glabrata NOT DETECTED NOT DETECTED Final   Candida krusei NOT DETECTED NOT DETECTED Final   Candida parapsilosis NOT DETECTED NOT DETECTED Final   Candida tropicalis NOT DETECTED NOT DETECTED Final   Cryptococcus neoformans/gattii NOT DETECTED NOT DETECTED Final   CTX-M ESBL NOT DETECTED NOT DETECTED Final   Carbapenem resistance IMP NOT DETECTED NOT DETECTED Final   Carbapenem resistance KPC NOT DETECTED NOT DETECTED Final   Carbapenem resistance NDM NOT DETECTED NOT DETECTED Final   Carbapenem resist OXA 48 LIKE NOT DETECTED NOT DETECTED Final   Carbapenem resistance VIM NOT DETECTED NOT DETECTED Final    Comment: Performed at [facility], [address] [place], [place] [address]  Resp Panel by RT-PCR (Flu A&B, Covid) Anterior Nasal Swab     Status: None   Collection Time: 01/01/22  8:15 PM   Specimen: Anterior Nasal Swab  Result Value Ref Range Status   SARS Coronavirus 2 by RT PCR NEGATIVE NEGATIVE Final    Comment: (NOTE) SARS-CoV-2 target nucleic acids are NOT DETECTED.  The SARS-CoV-2 RNA is generally detectable in upper respiratory specimens during the acute phase of infection. The lowest concentration of SARS-CoV-2 viral copies this assay can detect is 138 copies/mL. A negative result does not preclude SARS-Cov-2 infection and should not be used as the sole basis  for treatment or other patient management decisions. A negative result may occur  with  improper specimen collection/handling, submission of specimen other than nasopharyngeal swab, presence of viral mutation(s) within the areas targeted by this assay, and inadequate number of viral copies(<138 copies/mL). A negative result must be combined with clinical observations, patient history, and epidemiological information. The expected result is Negative.  Fact Sheet for Patients:  EntrepreneurPulse.com.au  Fact Sheet for Healthcare Providers:  IncredibleEmployment.be  This test is no t yet approved or cleared by the [place] FDA and  has been authorized for detection and/or diagnosis of SARS-CoV-2 by FDA under an Emergency Use Authorization (EUA). This EUA will remain  in effect (meaning this test can be used) for the duration of the COVID-19 declaration under Section 564(b)(1) of the Act, 21 U.S.C.section 360bbb-3(b)(1), unless the authorization is terminated  or revoked sooner.       Influenza A by PCR NEGATIVE NEGATIVE Final   Influenza B by PCR NEGATIVE NEGATIVE Final    Comment: (NOTE) The Xpert Xpress SARS-CoV-2/FLU/RSV plus assay is intended as an aid in the diagnosis of influenza from Nasopharyngeal swab specimens and should not be used as a sole basis for treatment. Nasal washings and aspirates are unacceptable for Xpert Xpress SARS-CoV-2/FLU/RSV testing.  Fact Sheet for Patients: EntrepreneurPulse.com.au  Fact Sheet for Healthcare Providers: IncredibleEmployment.be  This test is not yet approved or cleared by the [place] FDA and has been authorized for detection and/or diagnosis of SARS-CoV-2 by FDA under an Emergency Use Authorization (EUA). This EUA will remain in effect (meaning this test can be used) for the duration of the COVID-19 declaration under Section 564(b)(1) of the Act, 21 U.S.C. section 360bbb-3(b)(1), unless the authorization is terminated  or revoked.  Performed at [facility], [address] [place], [place] [address]     Radiology Studies: US Abdomen Limited RUQ (LIVER/GB)  Result Date: 01/02/2022 CLINICAL DATA:  Systemic inflammatory response syndrome. Upper abdominal pain. EXAM: ULTRASOUND ABDOMEN LIMITED RIGHT UPPER QUADRANT COMPARISON:  MRI 12/19/2021 FINDINGS: Gallbladder: Stones are identified within the gallbladder as noted recent MRI. Tumefactive sludge is noted on today's study. No pericholecystic fluid or sonographic Murphy's sign. Mild gallbladder wall thickening measuring 3.3 mm. Common bile duct: Diameter: 4.5 mm Liver: The liver appears heterogeneous with a lobular contour concerning for cirrhosis. Scattered hypoechoic areas are noted concerning for multifocal liver low metastasis versus hepatomas. Portal vein is patent on color Doppler imaging with normal direction of blood flow towards the liver. Other: None. IMPRESSION: 1. Gallstones and gallbladder sludge. Mild gallbladder wall thickening. No pericholecystic fluid or sonographic Murphy's sign. 2. Cirrhosis of the liver. 3. Multiple hypoechoic liver lesions suspicious for either metastatic disease or multifocal hepatoma. Electronically Signed   By: Kerby Moors M.D.   On: 01/02/2022 05:33   CT Angio Chest PE W and/or Wo Contrast  Result Date: 01/01/2022 CLINICAL DATA:  Chest pain and hypoxia.  Difficulty swallowing. EXAM: CT ANGIOGRAPHY CHEST WITH CONTRAST TECHNIQUE: Multidetector CT imaging of the chest was performed using the standard protocol during bolus administration of intravenous contrast. Multiplanar CT image reconstructions and MIPs were obtained to evaluate the vascular anatomy. RADIATION DOSE REDUCTION: This exam was performed according to the departmental dose-optimization program which includes automated exposure control, adjustment of the mA and/or kV according to patient size and/or use of iterative reconstruction technique. CONTRAST:   73m OMNIPAQUE IOHEXOL 350 MG/ML SOLN COMPARISON:  12/09/2021 FINDINGS: Cardiovascular: Good opacification of the central and segmental pulmonary arteries.  No focal filling defects. No evidence of significant pulmonary embolus. Normal heart size. No pericardial effusions. Normal caliber thoracic aorta. No dissection. Calcification of the aorta and coronary arteries. Postoperative changes consistent with coronary bypass. Mediastinum/Nodes: Thyroid gland is unremarkable. Prominent lymph nodes in the supraclavicular regions and mediastinum. Largest are subcarinal lymph nodes measuring about 12 mm short axis dimension. Appearances are similar to prior study. Nonspecific etiology, likely reactive. Lungs/Pleura: Atelectasis in the lung bases. No airspace disease or consolidation. No pleural effusions. No pneumothorax. Upper Abdomen: Nodular contour to the liver suggesting hepatic cirrhosis. Small esophageal hiatal hernia. Scattered upper abdominal lymph nodes without pathologic enlargement. Musculoskeletal: Degenerative changes. No destructive bone lesions. Sternotomy wires. Review of the MIP images confirms the above findings. IMPRESSION: 1. No evidence of significant pulmonary embolus. 2. Aortic atherosclerosis. 3. No evidence of active pulmonary disease. 4. Nonspecific mediastinal lymphadenopathy. No change since prior study. 5. Probable hepatic cirrhosis. Electronically Signed   By: Lucienne Capers M.D.   On: 01/01/2022 22:05   DG Chest Port 1 View  Result Date: 01/01/2022 CLINICAL DATA:  Chest pain EXAM: PORTABLE CHEST 1 VIEW COMPARISON:  12/08/2021 FINDINGS: Prior CABG. Heart and mediastinal contours are within normal limits. No focal opacities or effusions. No acute bony abnormality. IMPRESSION: No active cardiopulmonary disease. Electronically Signed   By: Rolm Baptise M.D.   On: 01/01/2022 21:04    Scheduled Meds:  aspirin EC  81 mg Oral Daily   atorvastatin  10 mg Oral Daily   clopidogrel  75 mg Oral  Daily   heparin  5,000 Units Subcutaneous Q8H   insulin aspart  0-5 Units Subcutaneous QHS   insulin aspart  0-6 Units Subcutaneous TID WC   pantoprazole  40 mg Oral Daily   sodium chloride flush  3 mL Intravenous Q12H   Continuous Infusions:  ceFEPime (MAXIPIME) IV 2 g (01/03/22 1008)     LOS: 2 days    Time spent: 35 mins    Betsi Crespi, MD [facility]   If 7PM-7AM, please contact night-coverage

## 2022-01-03 NOTE — Care Management Important Message (Signed)
Important Message  Patient Details  Name: Randy Frazier MRN: [id number] Date of Birth: Jul 22, 1927   Medicare Important Message Given:  Yes     Dannette Barbara 01/03/2022, 12:37 PM

## 2022-01-04 ENCOUNTER — Inpatient Hospital Stay: Payer: Medicare Other

## 2022-01-04 DIAGNOSIS — R6510 Systemic inflammatory response syndrome (SIRS) of non-infectious origin without acute organ dysfunction: Secondary | ICD-10-CM | POA: Diagnosis not present

## 2022-01-04 LAB — CBC
HCT: 31.1 % — ABNORMAL LOW (ref 39.0–52.0)
Hemoglobin: 10.4 g/dL — ABNORMAL LOW (ref 13.0–17.0)
MCH: 27.5 pg (ref 26.0–34.0)
MCHC: 33.4 g/dL (ref 30.0–36.0)
MCV: 82.3 fL (ref 80.0–100.0)
Platelets: 108 10*3/uL — ABNORMAL LOW (ref 150–400)
RBC: 3.78 MIL/uL — ABNORMAL LOW (ref 4.22–5.81)
RDW: 18 % — ABNORMAL HIGH (ref 11.5–15.5)
WBC: 15.8 10*3/uL — ABNORMAL HIGH (ref 4.0–10.5)
nRBC: 0 % (ref 0.0–0.2)

## 2022-01-04 LAB — COMPREHENSIVE METABOLIC PANEL
ALT: 30 U/L (ref 0–44)
AST: 61 U/L — ABNORMAL HIGH (ref 15–41)
Albumin: 2.9 g/dL — ABNORMAL LOW (ref 3.5–5.0)
Alkaline Phosphatase: 126 U/L (ref 38–126)
Anion gap: 8 (ref 5–15)
BUN: 31 mg/dL — ABNORMAL HIGH (ref 8–23)
CO2: 22 mmol/L (ref 22–32)
Calcium: 8.7 mg/dL — ABNORMAL LOW (ref 8.9–10.3)
Chloride: 100 mmol/L (ref 98–111)
Creatinine, Ser: 1.22 mg/dL (ref 0.61–1.24)
GFR, Estimated: 56 mL/min — ABNORMAL LOW (ref >60)
Glucose, Bld: 177 mg/dL — ABNORMAL HIGH (ref 70–99)
Potassium: 4 mmol/L (ref 3.5–5.1)
Sodium: 130 mmol/L — ABNORMAL LOW (ref 135–145)
Total Bilirubin: 0.8 mg/dL (ref 0.3–1.2)
Total Protein: 7.2 g/dL (ref 6.5–8.1)

## 2022-01-04 LAB — GLUCOSE, CAPILLARY
Glucose-Capillary: 173 mg/dL — ABNORMAL HIGH (ref 70–99)
Glucose-Capillary: 169 mg/dL — ABNORMAL HIGH (ref 70–99)
Glucose-Capillary: 156 mg/dL — ABNORMAL HIGH (ref 70–99)
Glucose-Capillary: 194 mg/dL — ABNORMAL HIGH (ref 70–99)

## 2022-01-04 MED ORDER — TECHNETIUM TC 99M MEBROFENIN IV KIT
5.1300 | PACK | Freq: Once | INTRAVENOUS | Status: AC | PRN
  Administered 2022-01-04: 5.13 via INTRAVENOUS

## 2022-01-04 NOTE — Progress Notes (Signed)
PROGRESS NOTE    Randy Frazier  OVF:[id number] DOB: 1927-02-21 DOA: 01/01/2022 PCP: Pcp, No    Brief Narrative: This 88 years old male with PMH significant for hypertension, insulin-dependent diabetes mellitus, CAD s/p CABG, history of CVA now presented in the ED with complaints of loss of appetite, shaking chills, upper abdominal pain since yesterday.  Patient has been complaining about some pain in the epigastric associated decreased p.o. intake denies any nausea vomiting.  He was febrile on arrival was given fluid resuscitation.  He was hypoxic requiring 2 L of supplemental oxygen.  CTA ruled out pulmonary embolism.  Blood cultures were collected and patient was admitted for sepsis secondary to unknown cause.  Assessment & Plan:   Principal Problem:   SIRS (systemic inflammatory response syndrome) (HCC) Active Problems:   Epigastric pain   Cirrhosis (HCC)   CAD, AUTOLOGOUS BYPASS GRAFT   DM type 2 with diabetic peripheral neuropathy (HCC)   History of CVA (cerebrovascular accident)   Liver mass   Acute respiratory failure with hypoxia (HCC)   AKI (acute kidney injury) ([place])  Suspected sepsis: E. coli/ Klebsiella bacteremia: Patient presented with chills, rigors, loss of appetite, upper abdominal pain and found to have fever with leukocytosis and elevated lactic acid. COVID and influenza PCR are negative and no evidence for infection seen on chest CT  Started on empiric antibiotics and was given IV fluid resuscitation. Lactic acid 4.2, improved with fluid resuscitation Procalcitonin 7.18 trended up to 22.03, WBC 19.2, UA : unremarkable. Continue current antibiotics Cefepime [place] RUQ: Gallstones and gallbladder sludge. Mild gallbladder wall Thickening.   Blood cultures are growing E. coli, enterococci and Klebsiella. Patient continues to have leukocytosis despite being on antibiotics. General surgery consulted for gallstones,  recommended HIDA scan. HIDA scan completed and  result consistent with biliary dyskinesia. General surgery recommended IR intervention for possible percutaneous drainage.  Acute kidney injury: > Resolved. SCr is 1.60 on admission; baseline appears to be closer to 1.1  Likely prerenal in setting of suspected sepsis and anorexia with hypovolemia  Continue IVF hydration, hold ACE-i, renally-dose medications,  Serum creatinine improving 1.61> 1.41 >1.37>1.22   Acute hypoxic respiratory failure: He was hypoxic requiring supplemental oxygen. He denies cough and there is no PE or acute pulmonary findings on CTA chest in ED  Continue supplemental O2 as needed and wean as tolerated.   Cirrhosis /Liver masses Recent diagnosis, concerning for hepatocellular carcinoma, had initial GI consult this month and saw oncology for the first time on 12/31/21  Continue GI and oncology follow-up on discharge as planned.   Diabetes Mellitus HbA1c was 7.3% in October 2023  Continue regular insulin sliding scale   Essential hypertension Continue home medications.   Coronary artery disease- Continue statin and antiplatelets, hold beta-blocker initially given concern for evolving sepsis     History of CVA: Continue statin and antiplatelets   DVT prophylaxis: Heparin sq Code Status:Full code Family Communication: No family at bed side. Disposition Plan:   Status is: Inpatient Remains inpatient appropriate because: Admitted for suspected sepsis secondary to unknown cause.  Continue empiric antibiotics follow-up cultures.   Anticipated discharge home 01/06/2022.   Consultants:  None  Procedures: CTA chest Antimicrobials:  Anti-infectives (From admission, onward)    Start     Dose/Rate Route Frequency Ordered Stop   01/03/22 2100  vancomycin (VANCOREADY) IVPB 1750 mg/350 mL  Status:  Discontinued        1,750 mg 175 mL/hr over 120 Minutes Intravenous Every 48  hours 01/02/22 0049 01/02/22 0053   01/03/22 2100  vancomycin (VANCOREADY) IVPB 1750  mg/350 mL  Status:  Discontinued        1,750 mg 175 mL/hr over 120 Minutes Intravenous Every 48 hours 01/02/22 0053 01/02/22 0449   01/02/22 0900  ceFEPIme (MAXIPIME) 2 g in sodium chloride 0.9 % 100 mL IVPB  Status:  Discontinued        2 g 200 mL/hr over 30 Minutes Intravenous Every 12 hours 01/02/22 0047 01/02/22 0053   01/02/22 0900  ceFEPIme (MAXIPIME) 2 g in sodium chloride 0.9 % 100 mL IVPB        2 g 200 mL/hr over 30 Minutes Intravenous Every 12 hours 01/02/22 0053 01/09/22 0859   01/02/22 0845  metroNIDAZOLE (FLAGYL) IVPB 500 mg  Status:  Discontinued        500 mg 100 mL/hr over 60 Minutes Intravenous Every 12 hours 01/01/22 2320 01/02/22 0448   01/01/22 2045  ceFEPIme (MAXIPIME) 2 g in sodium chloride 0.9 % 100 mL IVPB        2 g 200 mL/hr over 30 Minutes Intravenous  Once 01/01/22 2033 01/01/22 2150   01/01/22 2045  metroNIDAZOLE (FLAGYL) IVPB 500 mg        500 mg 100 mL/hr over 60 Minutes Intravenous  Once 01/01/22 2033 01/01/22 2200   01/01/22 2045  vancomycin (VANCOCIN) IVPB 1000 mg/200 mL premix  Status:  Discontinued        1,000 mg 200 mL/hr over 60 Minutes Intravenous  Once 01/01/22 2033 01/01/22 2037   01/01/22 2045  vancomycin (VANCOREADY) IVPB 1500 mg/300 mL        1,500 mg 150 mL/hr over 120 Minutes Intravenous  Once 01/01/22 2037 01/01/22 2301      Subjective: Patient was seen and examined at bedside. Overnight events noted.   Patient reports doing better.  Blood pressure has improved. He denies any pain.  He underwent HIDA scan which is consistent with biliary dyskinesia.  Objective: Vitals:   01/04/22 0340 01/04/22 0344 01/04/22 0738 01/04/22 1300  BP:  (!) 154/76 (!) 156/74 (!) 159/87  Pulse:  74 66 73  Resp:  '16 18 16  '$ Temp:  98.3 F (36.8 C) (!) 97.5 F (36.4 C) 97.9 F (36.6 C)  TempSrc:  Oral Oral Oral  SpO2:  99% 99% 99%  Weight: 78.5 kg     Height:        Intake/Output Summary (Last 24 hours) at 01/04/2022 1322 Last data filed at  01/04/2022 0000 Gross per 24 hour  Intake 1143 ml  Output --  Net 1143 ml   Filed Weights   01/01/22 1956 01/04/22 0340  Weight: 78 kg 78.5 kg    Examination:  General exam: Appears comfortable, not in any acute distress, deconditioned. Respiratory system: CTA bilaterally, no wheezing, no crackles, normal respiratory effort. Cardiovascular system: S1 & S2 heard, regular rate and rhythm, no murmur. Gastrointestinal system: Abdomen is soft, mildly tender in upper abdomen, no rebound, no guarding, BS+ Central nervous system: Alert and oriented x 3 . No focal neurological deficits. Extremities:  No edema, no cyanosis, no clubbing Skin: No rashes, lesions or ulcers Psychiatry: Judgement and insight appear normal. Mood & affect appropriate.     Data Reviewed: I have personally reviewed following labs and imaging studies  CBC: Recent Labs  Lab 01/01/22 2007 01/02/22 0519 01/03/22 0406 01/04/22 0413  WBC 15.9* 19.2* 23.0* 15.8*  NEUTROABS 10.2*  --   --   --  HGB 11.5* 10.4* 9.9* 10.4*  HCT 34.5* 31.2* 29.7* 31.1*  MCV 83.7 83.9 83.7 82.3  PLT 179 134* 109* 010*   Basic Metabolic Panel: Recent Labs  Lab 01/01/22 2007 01/02/22 0519 01/03/22 0406 01/04/22 0413  NA 132* 134* 135 130*  K 5.4* 4.1 4.2 4.0  CL 100 104 104 100  CO2 21* 20* 23 22  GLUCOSE 173* 164* 138* 177*  BUN 26* 27* 33* 31*  CREATININE 1.60* 1.41* 1.37* 1.22  CALCIUM 8.5* 8.5* 8.3* 8.7*   GFR: Estimated Creatinine Clearance: 43.3 mL/min (by C-G formula based on SCr of 1.22 mg/dL). Liver Function Tests: Recent Labs  Lab 01/01/22 2007 01/02/22 0519 01/03/22 0406 01/04/22 0413  AST 47* 44* 72* 61*  ALT '25 21 26 30  '$ ALKPHOS 121 109 87 126  BILITOT 1.7* 1.2 0.8 0.8  PROT 7.4 6.8 6.6 7.2  ALBUMIN 3.1* 2.9* 2.8* 2.9*   No results for input(s): "LIPASE", "AMYLASE" in the last 168 hours. No results for input(s): "AMMONIA" in the last 168 hours. Coagulation Profile: Recent Labs  Lab  12/31/21 1409  INR 1.1   Cardiac Enzymes: No results for input(s): "CKTOTAL", "CKMB", "CKMBINDEX", "TROPONINI" in the last 168 hours. BNP (last 3 results) No results for input(s): "PROBNP" in the last 8760 hours. HbA1C: No results for input(s): "HGBA1C" in the last 72 hours. CBG: Recent Labs  Lab 01/03/22 1127 01/03/22 1548 01/03/22 2104 01/04/22 0738 01/04/22 1249  GLUCAP 132* 166* 199* 173* 169*   Lipid Profile: No results for input(s): "CHOL", "HDL", "LDLCALC", "TRIG", "CHOLHDL", "LDLDIRECT" in the last 72 hours. Thyroid Function Tests: No results for input(s): "TSH", "T4TOTAL", "FREET4", "T3FREE", "THYROIDAB" in the last 72 hours. Anemia Panel: No results for input(s): "VITAMINB12", "FOLATE", "FERRITIN", "TIBC", "IRON", "RETICCTPCT" in the last 72 hours. Sepsis Labs: Recent Labs  Lab 01/01/22 2007 01/02/22 0058 01/02/22 0519 01/02/22 1710 01/03/22 0406  PROCALCITON  --  7.18 22.03  --  21.59  LATICACIDVEN 4.5* 4.2*  --  1.4  --     Recent Results (from the past 240 hour(s))  Blood Culture (routine x 2)     Status: Abnormal (Preliminary result)   Collection Time: 01/01/22  8:07 PM   Specimen: BLOOD RIGHT ARM  Result Value Ref Range Status   Specimen Description   Final    BLOOD RIGHT ARM Performed at [facility], [address] [place], [place] [address]    Special Requests   Final    BOTTLES DRAWN AEROBIC AND ANAEROBIC Blood Culture results may not be optimal due to an excessive volume of blood received in culture bottles Performed at [facility], [address] [place], [place] [address]    Culture  Setup Time   Final    GRAM NEGATIVE RODS IN BOTH AEROBIC AND ANAEROBIC BOTTLES CRITICAL RESULT CALLED TO, READ BACK BY AND VERIFIED WITH: JASON ROBBINS 01/02/2022 AT 0406 SRR Performed at [facility], [address] [place], [place] [address]    Culture (A)  Final    ESCHERICHIA COLI CULTURE REINCUBATED FOR BETTER  GROWTH SUSCEPTIBILITIES PERFORMED ON PREVIOUS CULTURE WITHIN THE LAST 5 DAYS. Performed at [facility], [place] [address] [place], [place] [address]    Report Status PENDING  Incomplete  Blood Culture (routine x 2)     Status: Abnormal (Preliminary result)   Collection Time: 01/01/22  8:12 PM   Specimen: BLOOD LEFT ARM  Result Value Ref Range Status   Specimen Description BLOOD LEFT  ARM  Final   Special Requests   Final    Blood Culture results may not be optimal due to an inadequate volume of blood received in culture bottles   Culture  Setup Time   Final    GRAM NEGATIVE RODS IN BOTH AEROBIC AND ANAEROBIC BOTTLES Organism ID to follow CRITICAL RESULT CALLED TO, READ BACK BY AND VERIFIED WITH: JASON ROBBIN 01/02/2022 AT 0406 SRR    Culture (A)  Final    ESCHERICHIA COLI CULTURE REINCUBATED FOR BETTER GROWTH    Report Status PENDING  Incomplete   Organism ID, Bacteria ESCHERICHIA COLI  Final      Susceptibility   Escherichia coli - MIC*    AMPICILLIN <=2 SENSITIVE Sensitive     CEFAZOLIN <=4 SENSITIVE Sensitive     CEFEPIME <=0.12 SENSITIVE Sensitive     CEFTAZIDIME <=1 SENSITIVE Sensitive     CEFTRIAXONE <=0.25 SENSITIVE Sensitive     CIPROFLOXACIN <=0.25 SENSITIVE Sensitive     GENTAMICIN <=1 SENSITIVE Sensitive     IMIPENEM <=0.25 SENSITIVE Sensitive     TRIMETH/SULFA <=20 SENSITIVE Sensitive     AMPICILLIN/SULBACTAM <=2 SENSITIVE Sensitive     PIP/TAZO Value in next row Sensitive      <=4 SENSITIVEPerformed at [facility], [address], [place], [place] [address]    * ESCHERICHIA COLI  Blood Culture ID Panel (Reflexed)     Status: Abnormal   Collection Time: 01/01/22  8:12 PM  Result Value Ref Range Status   Enterococcus faecalis NOT DETECTED NOT DETECTED Final   Enterococcus Faecium NOT DETECTED NOT DETECTED Final   Listeria monocytogenes NOT DETECTED NOT DETECTED Final   Staphylococcus species NOT DETECTED NOT DETECTED Final   Staphylococcus aureus  (BCID) NOT DETECTED NOT DETECTED Final   Staphylococcus epidermidis NOT DETECTED NOT DETECTED Final   Staphylococcus lugdunensis NOT DETECTED NOT DETECTED Final   Streptococcus species NOT DETECTED NOT DETECTED Final   Streptococcus agalactiae NOT DETECTED NOT DETECTED Final   Streptococcus pneumoniae NOT DETECTED NOT DETECTED Final   Streptococcus pyogenes NOT DETECTED NOT DETECTED Final   A.calcoaceticus-baumannii NOT DETECTED NOT DETECTED Final   Bacteroides fragilis NOT DETECTED NOT DETECTED Final   Enterobacterales DETECTED (A) NOT DETECTED Final    Comment: CRITICAL RESULT CALLED TO, READ BACK BY AND VERIFIED WITH: JASON ROBBINS 01/02/2022 AT 0406 SRR    Enterobacter cloacae complex NOT DETECTED NOT DETECTED Final   Escherichia coli DETECTED (A) NOT DETECTED Final    Comment: CRITICAL RESULT CALLED TO, READ BACK BY AND VERIFIED WITH: JASON ROBBINS 01/02/2022 AT 0406 SRR    Klebsiella aerogenes DETECTED (A) NOT DETECTED Final    Comment: CRITICAL RESULT CALLED TO, READ BACK BY AND VERIFIED WITH: JASON ROBBINS 01/02/2022 AT 0406 SRR    Klebsiella oxytoca NOT DETECTED NOT DETECTED Final   Klebsiella pneumoniae NOT DETECTED NOT DETECTED Final   Proteus species NOT DETECTED NOT DETECTED Final   Salmonella species NOT DETECTED NOT DETECTED Final   Serratia marcescens NOT DETECTED NOT DETECTED Final   Haemophilus influenzae NOT DETECTED NOT DETECTED Final   Neisseria meningitidis NOT DETECTED NOT DETECTED Final   Pseudomonas aeruginosa NOT DETECTED NOT DETECTED Final   Stenotrophomonas maltophilia NOT DETECTED NOT DETECTED Final   Candida albicans NOT DETECTED NOT DETECTED Final   Candida auris NOT DETECTED NOT DETECTED Final   Candida glabrata NOT DETECTED NOT DETECTED Final   Candida krusei NOT DETECTED NOT DETECTED Final   Candida parapsilosis NOT DETECTED NOT DETECTED  Final   Candida tropicalis NOT DETECTED NOT DETECTED Final   Cryptococcus neoformans/gattii NOT DETECTED NOT  DETECTED Final   CTX-M ESBL NOT DETECTED NOT DETECTED Final   Carbapenem resistance IMP NOT DETECTED NOT DETECTED Final   Carbapenem resistance KPC NOT DETECTED NOT DETECTED Final   Carbapenem resistance NDM NOT DETECTED NOT DETECTED Final   Carbapenem resist OXA 48 LIKE NOT DETECTED NOT DETECTED Final   Carbapenem resistance VIM NOT DETECTED NOT DETECTED Final    Comment: Performed at [facility], [address] [place], [place] [address]  Resp Panel by RT-PCR (Flu A&B, Covid) Anterior Nasal Swab     Status: None   Collection Time: 01/01/22  8:15 PM   Specimen: Anterior Nasal Swab  Result Value Ref Range Status   SARS Coronavirus 2 by RT PCR NEGATIVE NEGATIVE Final    Comment: (NOTE) SARS-CoV-2 target nucleic acids are NOT DETECTED.  The SARS-CoV-2 RNA is generally detectable in upper respiratory specimens during the acute phase of infection. The lowest concentration of SARS-CoV-2 viral copies this assay can detect is 138 copies/mL. A negative result does not preclude SARS-Cov-2 infection and should not be used as the sole basis for treatment or other patient management decisions. A negative result may occur with  improper specimen collection/handling, submission of specimen other than nasopharyngeal swab, presence of viral mutation(s) within the areas targeted by this assay, and inadequate number of viral copies(<138 copies/mL). A negative result must be combined with clinical observations, patient history, and epidemiological information. The expected result is Negative.  Fact Sheet for Patients:  EntrepreneurPulse.com.au  Fact Sheet for Healthcare Providers:  IncredibleEmployment.be  This test is no t yet approved or cleared by the [place] FDA and  has been authorized for detection and/or diagnosis of SARS-CoV-2 by FDA under an Emergency Use Authorization (EUA). This EUA will remain  in effect (meaning this test can be  used) for the duration of the COVID-19 declaration under Section 564(b)(1) of the Act, 21 U.S.C.section 360bbb-3(b)(1), unless the authorization is terminated  or revoked sooner.       Influenza A by PCR NEGATIVE NEGATIVE Final   Influenza B by PCR NEGATIVE NEGATIVE Final    Comment: (NOTE) The Xpert Xpress SARS-CoV-2/FLU/RSV plus assay is intended as an aid in the diagnosis of influenza from Nasopharyngeal swab specimens and should not be used as a sole basis for treatment. Nasal washings and aspirates are unacceptable for Xpert Xpress SARS-CoV-2/FLU/RSV testing.  Fact Sheet for Patients: EntrepreneurPulse.com.au  Fact Sheet for Healthcare Providers: IncredibleEmployment.be  This test is not yet approved or cleared by the [place] FDA and has been authorized for detection and/or diagnosis of SARS-CoV-2 by FDA under an Emergency Use Authorization (EUA). This EUA will remain in effect (meaning this test can be used) for the duration of the COVID-19 declaration under Section 564(b)(1) of the Act, 21 U.S.C. section 360bbb-3(b)(1), unless the authorization is terminated or revoked.  Performed at [facility], [address] [place], [place] [address]     Radiology Studies: NM Hepato W/EF  Result Date: 01/04/2022 CLINICAL DATA:  Abdominal pain.  Evaluate gallbladder motility. EXAM: NUCLEAR MEDICINE HEPATOBILIARY IMAGING WITH GALLBLADDER EF TECHNIQUE: Sequential images of the abdomen were obtained out to 60 minutes following intravenous administration of radiopharmaceutical. After oral ingestion of Ensure, gallbladder ejection fraction was determined. At 60 min, normal ejection fraction is greater than 33%. RADIOPHARMACEUTICALS:  5.13 mCi Tc-24m Choletec IV COMPARISON:  None Available. FINDINGS: Prompt uptake and  biliary excretion of activity by the liver is seen. Gallbladder activity is visualized, consistent with patency of cystic  duct. Biliary activity passes into small bowel, consistent with patent common bile duct. Calculated gallbladder ejection fraction is 16%. (Normal gallbladder ejection fraction with Ensure is greater than 33%.) IMPRESSION: 1. The gallbladder ejection fraction is abnormally low consistent with biliary dyskinesia. Electronically Signed   By: Dorise Bullion III M.D.   On: 01/04/2022 11:51    Scheduled Meds:  aspirin EC  81 mg Oral Daily   atorvastatin  10 mg Oral Daily   clopidogrel  75 mg Oral Daily   heparin  5,000 Units Subcutaneous Q8H   insulin aspart  0-5 Units Subcutaneous QHS   insulin aspart  0-6 Units Subcutaneous TID WC   pantoprazole  40 mg Oral Daily   sodium chloride flush  3 mL Intravenous Q12H   Continuous Infusions:  ceFEPime (MAXIPIME) IV 2 g (01/04/22 0800)     LOS: 3 days    Time spent: 35 mins    Zayn Selley, MD [facility]   If 7PM-7AM, please contact night-coverage

## 2022-01-04 NOTE — Plan of Care (Signed)
  Problem: Pain Managment: Goal: General experience of comfort will improve Outcome: Progressing   Problem: Safety: Goal: Ability to remain free from injury will improve Outcome: Progressing   Problem: Skin Integrity: Goal: Risk for impaired skin integrity will decrease Outcome: Progressing   

## 2022-01-04 NOTE — Progress Notes (Signed)
Mobility Specialist - Progress Note   01/04/22 1136  Mobility  Activity Ambulated independently in room;Stood at bedside;Dangled on edge of bed  Level of Assistance Independent  Assistive Device None  Distance Ambulated (ft) 10 ft  Activity Response Tolerated well  Mobility Referral Yes  $Mobility charge 1 Mobility   Pt OOB on RA upon arrival. Pt ambulates within room indep. Pt left EOB with needs in reach.   Gretchen Short  Mobility Specialist  01/04/22 11:37 AM

## 2022-01-05 DIAGNOSIS — R6510 Systemic inflammatory response syndrome (SIRS) of non-infectious origin without acute organ dysfunction: Secondary | ICD-10-CM | POA: Diagnosis not present

## 2022-01-05 LAB — BASIC METABOLIC PANEL
Anion gap: 7 (ref 5–15)
BUN: 26 mg/dL — ABNORMAL HIGH (ref 8–23)
CO2: 25 mmol/L (ref 22–32)
Calcium: 9.3 mg/dL (ref 8.9–10.3)
Chloride: 104 mmol/L (ref 98–111)
Creatinine, Ser: 1.14 mg/dL (ref 0.61–1.24)
GFR, Estimated: >60 mL/min (ref >60)
Glucose, Bld: 178 mg/dL — ABNORMAL HIGH (ref 70–99)
Potassium: 4.3 mmol/L (ref 3.5–5.1)
Sodium: 136 mmol/L (ref 135–145)

## 2022-01-05 LAB — GLUCOSE, CAPILLARY
Glucose-Capillary: 169 mg/dL — ABNORMAL HIGH (ref 70–99)
Glucose-Capillary: 133 mg/dL — ABNORMAL HIGH (ref 70–99)
Glucose-Capillary: 171 mg/dL — ABNORMAL HIGH (ref 70–99)
Glucose-Capillary: 176 mg/dL — ABNORMAL HIGH (ref 70–99)

## 2022-01-05 LAB — CBC
HCT: 32.4 % — ABNORMAL LOW (ref 39.0–52.0)
Hemoglobin: 11 g/dL — ABNORMAL LOW (ref 13.0–17.0)
MCH: 27.8 pg (ref 26.0–34.0)
MCHC: 34 g/dL (ref 30.0–36.0)
MCV: 81.8 fL (ref 80.0–100.0)
Platelets: 102 10*3/uL — ABNORMAL LOW (ref 150–400)
RBC: 3.96 MIL/uL — ABNORMAL LOW (ref 4.22–5.81)
RDW: 17.9 % — ABNORMAL HIGH (ref 11.5–15.5)
WBC: 13.8 10*3/uL — ABNORMAL HIGH (ref 4.0–10.5)
nRBC: 0 % (ref 0.0–0.2)

## 2022-01-05 LAB — CULTURE, BLOOD (ROUTINE X 2)

## 2022-01-05 LAB — PROCALCITONIN: Procalcitonin: 5.49 ng/mL

## 2022-01-05 LAB — PHOSPHORUS: Phosphorus: 3.4 mg/dL (ref 2.5–4.6)

## 2022-01-05 LAB — MAGNESIUM: Magnesium: 1.8 mg/dL (ref 1.7–2.4)

## 2022-01-05 MED ORDER — HYDRALAZINE HCL 25 MG PO TABS
25.0000 mg | ORAL_TABLET | Freq: Three times a day (TID) | ORAL | Status: DC
  Administered 2022-01-05 – 2022-01-09 (×12): 25 mg via ORAL
  Filled 2022-01-05 (×12): qty 1

## 2022-01-05 MED ORDER — INDOCYANINE GREEN 25 MG IV SOLR
2.5000 mg | Freq: Once | INTRAVENOUS | Status: AC
  Administered 2022-01-06: 2.5 mg via INTRAVENOUS
  Filled 2022-01-05: qty 1

## 2022-01-05 MED ORDER — DIPHENHYDRAMINE HCL 12.5 MG/5ML PO ELIX
12.5000 mg | ORAL_SOLUTION | Freq: Once | ORAL | Status: AC
  Administered 2022-01-05: 12.5 mg via ORAL
  Filled 2022-01-05: qty 5

## 2022-01-05 NOTE — Progress Notes (Signed)
       CROSS COVER NOTE  NAME: QUAYSHAWN NIN MRN: [id number] DOB : May 22, 1927 ATTENDING PHYSICIAN: Shawna Clamp, MD    Date of Service   01/05/2022   HPI/Events of Note   Medication request received for Tylenol PM as sleep aid.  Interventions   Assessment/Plan:  Benadryl 12.5 mg x1    This document was prepared using Dragon voice recognition software and may include unintentional dictation errors.  Neomia Glass DNP, MBA, FNP-BC Nurse Practitioner [facility] Pager [phone]

## 2022-01-05 NOTE — Progress Notes (Signed)
PROGRESS NOTE    LOT MEDFORD  YHC:[id number] DOB: January 01, 1928 DOA: 01/01/2022 PCP: Pcp, No    Brief Narrative: This 88 years old male with PMH significant for hypertension, insulin-dependent diabetes mellitus, CAD s/p CABG, history of CVA now presented in the ED with complaints of loss of appetite, shaking chills, upper abdominal pain since yesterday.  Patient has been complaining about some pain in the epigastric associated decreased p.o. intake denies any nausea vomiting.  He was febrile on arrival was given fluid resuscitation.  He was hypoxic requiring 2 L of supplemental oxygen.  CTA ruled out pulmonary embolism.  Blood cultures were collected and patient was admitted for sepsis secondary to unknown cause.  Assessment & Plan:   Principal Problem:   SIRS (systemic inflammatory response syndrome) (HCC) Active Problems:   Epigastric pain   Cirrhosis (HCC)   CAD, AUTOLOGOUS BYPASS GRAFT   DM type 2 with diabetic peripheral neuropathy (HCC)   History of CVA (cerebrovascular accident)   Liver mass   Acute respiratory failure with hypoxia (HCC)   AKI (acute kidney injury) ([place])  Suspected sepsis: E. Coli / Klebsiella bacteremia: Patient presented with chills, rigors, loss of appetite, upper abdominal pain and found to have fever with leukocytosis and elevated lactic acid. COVID and influenza PCR are negative and no evidence for infection seen on chest CT  Started on empiric antibiotics and was given IV fluid resuscitation. Lactic acid 4.2, improved with fluid resuscitation Procalcitonin 7.18 trended up to 22.03, WBC 19.2, UA : unremarkable. Continue current antibiotics Cefepime [place] RUQ: Gallstones and gallbladder sludge. Mild gallbladder wall Thickening.   Blood cultures are growing E. coli, enterococci and Klebsiella. Patient continues to have leukocytosis despite being on antibiotics. General surgery consulted for gallstones,  recommended HIDA scan. HIDA scan completed and  result consistent with biliary dyskinesia. General surgery recommended IR intervention for possible percutaneous drainage. Patient wants to have gallbladder removed,  states he is pretty healthy. General surgery notified.  Acute kidney injury: > Resolved. SCr is 1.60 on admission; baseline appears to be closer to 1.1  Likely prerenal in setting of suspected sepsis and anorexia with hypovolemia  Continue IVF hydration, hold ACE-i, renally-dose medications,  Serum creatinine improving 1.61> 1.41 >1.37>1.22 >1.44   Acute hypoxic respiratory failure: > Resolved. He was hypoxic requiring supplemental oxygen. He denies cough and there is no PE or acute pulmonary findings on CTA chest in ED  He is weaned down to room air.  Acute hypoxia resolved.   Cirrhosis /Liver masses Recent diagnosis, concerning for hepatocellular carcinoma, had initial GI consult this month and saw oncology for the first time on 12/31/21  Continue GI and oncology follow-up on discharge as planned.   Diabetes Mellitus HbA1c was 7.3% in October 2023  Continue regular insulin sliding scale   Essential hypertension Continue home medications.   Coronary artery disease- Continue statin and antiplatelets, hold beta-blocker initially given concern for evolving sepsis     History of CVA: Continue statin and antiplatelets   DVT prophylaxis: Heparin sq Code Status:Full code Family Communication: No family at bed side. Disposition Plan:   Status is: Inpatient Remains inpatient appropriate because: Admitted for suspected sepsis secondary to unknown cause.  Continue empiric antibiotics follow-up cultures.   Anticipated discharge home 01/06/2022.  Consultants:  General surgery  Procedures: CTA chest, HIDA scan Antimicrobials:  Anti-infectives (From admission, onward)    Start     Dose/Rate Route Frequency Ordered Stop   01/03/22 2100  vancomycin (VANCOREADY) IVPB 1750  mg/350 mL  Status:  Discontinued        1,750  mg 175 mL/hr over 120 Minutes Intravenous Every 48 hours 01/02/22 0049 01/02/22 0053   01/03/22 2100  vancomycin (VANCOREADY) IVPB 1750 mg/350 mL  Status:  Discontinued        1,750 mg 175 mL/hr over 120 Minutes Intravenous Every 48 hours 01/02/22 0053 01/02/22 0449   01/02/22 0900  ceFEPIme (MAXIPIME) 2 g in sodium chloride 0.9 % 100 mL IVPB  Status:  Discontinued        2 g 200 mL/hr over 30 Minutes Intravenous Every 12 hours 01/02/22 0047 01/02/22 0053   01/02/22 0900  ceFEPIme (MAXIPIME) 2 g in sodium chloride 0.9 % 100 mL IVPB        2 g 200 mL/hr over 30 Minutes Intravenous Every 12 hours 01/02/22 0053 01/09/22 0859   01/02/22 0845  metroNIDAZOLE (FLAGYL) IVPB 500 mg  Status:  Discontinued        500 mg 100 mL/hr over 60 Minutes Intravenous Every 12 hours 01/01/22 2320 01/02/22 0448   01/01/22 2045  ceFEPIme (MAXIPIME) 2 g in sodium chloride 0.9 % 100 mL IVPB        2 g 200 mL/hr over 30 Minutes Intravenous  Once 01/01/22 2033 01/01/22 2150   01/01/22 2045  metroNIDAZOLE (FLAGYL) IVPB 500 mg        500 mg 100 mL/hr over 60 Minutes Intravenous  Once 01/01/22 2033 01/01/22 2200   01/01/22 2045  vancomycin (VANCOCIN) IVPB 1000 mg/200 mL premix  Status:  Discontinued        1,000 mg 200 mL/hr over 60 Minutes Intravenous  Once 01/01/22 2033 01/01/22 2037   01/01/22 2045  vancomycin (VANCOREADY) IVPB 1500 mg/300 mL        1,500 mg 150 mL/hr over 120 Minutes Intravenous  Once 01/01/22 2037 01/01/22 2301      Subjective: Patient was seen and examined at bedside. Overnight events noted.  Patient reports doing better. Blood pressure is reasonably controlled. He denies any abdominal pain.He underwent HIDA scan which is consistent with biliary dyskinesia. He wants to have gallbladder removed and stated he is pretty healthy.   Objective: Vitals:   01/05/22 0500 01/05/22 0621 01/05/22 0746 01/05/22 1203  BP:  (!) 154/85 (!) 161/74 (!) 142/69  Pulse:  62 63 71  Resp:   18   Temp:    97.8 F (36.6 C) 97.7 F (36.5 C)  TempSrc:      SpO2:   97% 99%  Weight: 78.1 kg     Height:        Intake/Output Summary (Last 24 hours) at 01/05/2022 1204 Last data filed at 01/05/2022 0000 Gross per 24 hour  Intake 100 ml  Output 150 ml  Net -50 ml   Filed Weights   01/01/22 1956 01/04/22 0340 01/05/22 0500  Weight: 78 kg 78.5 kg 78.1 kg    Examination:  General exam: Appears comfortable, not in any acute distress, deconditioned. Respiratory system: CTA bilaterally, no wheezing, no crackles, normal respiratory effort. Cardiovascular system: S1 & S2 heard, regular rate and rhythm, no murmur. Gastrointestinal system: Abdomen is soft, mildly tender in upper abdomen, non distended, BS+ Central nervous system: Alert and oriented x 3 . No focal neurological deficits. Extremities:  No edema, no cyanosis, no clubbing Skin: No rashes, lesions or ulcers Psychiatry: Judgement and insight appear normal. Mood & affect appropriate.     Data Reviewed: I have personally reviewed following labs  and imaging studies  CBC: Recent Labs  Lab 01/01/22 2007 01/02/22 0519 01/03/22 0406 01/04/22 0413 01/05/22 0414  WBC 15.9* 19.2* 23.0* 15.8* 13.8*  NEUTROABS 10.2*  --   --   --   --   HGB 11.5* 10.4* 9.9* 10.4* 11.0*  HCT 34.5* 31.2* 29.7* 31.1* 32.4*  MCV 83.7 83.9 83.7 82.3 81.8  PLT 179 134* 109* 108* 546*   Basic Metabolic Panel: Recent Labs  Lab 01/01/22 2007 01/02/22 0519 01/03/22 0406 01/04/22 0413 01/05/22 0414  NA 132* 134* 135 130* 136  K 5.4* 4.1 4.2 4.0 4.3  CL 100 104 104 100 104  CO2 21* 20* '23 22 25  '$ GLUCOSE 173* 164* 138* 177* 178*  BUN 26* 27* 33* 31* 26*  CREATININE 1.60* 1.41* 1.37* 1.22 1.14  CALCIUM 8.5* 8.5* 8.3* 8.7* 9.3  MG  --   --   --   --  1.8  PHOS  --   --   --   --  3.4   GFR: Estimated Creatinine Clearance: 46.3 mL/min (by C-G formula based on SCr of 1.14 mg/dL). Liver Function Tests: Recent Labs  Lab 01/01/22 2007 01/02/22 0519  01/03/22 0406 01/04/22 0413  AST 47* 44* 72* 61*  ALT '25 21 26 30  '$ ALKPHOS 121 109 87 126  BILITOT 1.7* 1.2 0.8 0.8  PROT 7.4 6.8 6.6 7.2  ALBUMIN 3.1* 2.9* 2.8* 2.9*   No results for input(s): "LIPASE", "AMYLASE" in the last 168 hours. No results for input(s): "AMMONIA" in the last 168 hours. Coagulation Profile: Recent Labs  Lab 12/31/21 1409  INR 1.1   Cardiac Enzymes: No results for input(s): "CKTOTAL", "CKMB", "CKMBINDEX", "TROPONINI" in the last 168 hours. BNP (last 3 results) No results for input(s): "PROBNP" in the last 8760 hours. HbA1C: No results for input(s): "HGBA1C" in the last 72 hours. CBG: Recent Labs  Lab 01/04/22 0738 01/04/22 1249 01/04/22 1637 01/04/22 2118 01/05/22 0752  GLUCAP 173* 169* 156* 194* 169*   Lipid Profile: No results for input(s): "CHOL", "HDL", "LDLCALC", "TRIG", "CHOLHDL", "LDLDIRECT" in the last 72 hours. Thyroid Function Tests: No results for input(s): "TSH", "T4TOTAL", "FREET4", "T3FREE", "THYROIDAB" in the last 72 hours. Anemia Panel: No results for input(s): "VITAMINB12", "FOLATE", "FERRITIN", "TIBC", "IRON", "RETICCTPCT" in the last 72 hours. Sepsis Labs: Recent Labs  Lab 01/01/22 2007 01/02/22 0058 01/02/22 0519 01/02/22 1710 01/03/22 0406  PROCALCITON  --  7.18 22.03  --  21.59  LATICACIDVEN 4.5* 4.2*  --  1.4  --     Recent Results (from the past 240 hour(s))  Blood Culture (routine x 2)     Status: Abnormal   Collection Time: 01/01/22  8:07 PM   Specimen: BLOOD RIGHT ARM  Result Value Ref Range Status   Specimen Description   Final    BLOOD RIGHT ARM Performed at [facility], [address], [place], [place] [address]    Special Requests   Final    BOTTLES DRAWN AEROBIC AND ANAEROBIC Blood Culture results may not be optimal due to an excessive volume of blood received in culture bottles Performed at [facility], [address], [place], [place] [address]    Culture  Setup Time   Final     GRAM NEGATIVE RODS IN BOTH AEROBIC AND ANAEROBIC BOTTLES CRITICAL RESULT CALLED TO, READ BACK BY AND VERIFIED WITH: JASON ROBBINS 01/02/2022 AT 0406 SRR Performed at [facility], [address], [place], [place] [address]    Culture (  A)  Final    ESCHERICHIA COLI ENTEROBACTER AEROGENES SUSCEPTIBILITIES PERFORMED ON PREVIOUS CULTURE WITHIN THE LAST 5 DAYS. Performed at [facility], [place] [address], [place], [place] [address]    Report Status 01/05/2022 FINAL  Final  Blood Culture (routine x 2)     Status: Abnormal   Collection Time: 01/01/22  8:12 PM   Specimen: BLOOD LEFT ARM  Result Value Ref Range Status   Specimen Description   Final    BLOOD LEFT ARM Performed at [facility], [address] [place], [place] [address]    Special Requests   Final    Blood Culture results may not be optimal due to an inadequate volume of blood received in culture bottles Performed at [facility], [address] [place], [place] [address]    Culture  Setup Time   Final    GRAM NEGATIVE RODS IN BOTH AEROBIC AND ANAEROBIC BOTTLES Organism ID to follow CRITICAL RESULT CALLED TO, READ BACK BY AND VERIFIED WITH: JASON ROBBIN 01/02/2022 AT 0406 SRR Performed at [facility], [address] [place], [place] [address]    Culture ESCHERICHIA COLI ENTEROBACTER AEROGENES  (A)  Final   Report Status 01/05/2022 FINAL  Final   Organism ID, Bacteria ESCHERICHIA COLI  Final   Organism ID, Bacteria ENTEROBACTER AEROGENES  Final      Susceptibility   Enterobacter aerogenes - MIC*    CEFAZOLIN >=64 RESISTANT Resistant     CEFEPIME <=0.12 SENSITIVE Sensitive     CEFTAZIDIME <=1 SENSITIVE Sensitive     CEFTRIAXONE <=0.25 SENSITIVE Sensitive     CIPROFLOXACIN <=0.25 SENSITIVE Sensitive     GENTAMICIN <=1 SENSITIVE Sensitive     IMIPENEM 2 SENSITIVE Sensitive     TRIMETH/SULFA <=20 SENSITIVE Sensitive     PIP/TAZO <=4 SENSITIVE Sensitive     *  ENTEROBACTER AEROGENES   Escherichia coli - MIC*    AMPICILLIN <=2 SENSITIVE Sensitive     CEFAZOLIN <=4 SENSITIVE Sensitive     CEFEPIME <=0.12 SENSITIVE Sensitive     CEFTAZIDIME <=1 SENSITIVE Sensitive     CEFTRIAXONE <=0.25 SENSITIVE Sensitive     CIPROFLOXACIN <=0.25 SENSITIVE Sensitive     GENTAMICIN <=1 SENSITIVE Sensitive     IMIPENEM <=0.25 SENSITIVE Sensitive     TRIMETH/SULFA <=20 SENSITIVE Sensitive     AMPICILLIN/SULBACTAM <=2 SENSITIVE Sensitive     PIP/TAZO <=4 SENSITIVE Sensitive     * ESCHERICHIA COLI  Blood Culture ID Panel (Reflexed)     Status: Abnormal   Collection Time: 01/01/22  8:12 PM  Result Value Ref Range Status   Enterococcus faecalis NOT DETECTED NOT DETECTED Final   Enterococcus Faecium NOT DETECTED NOT DETECTED Final   Listeria monocytogenes NOT DETECTED NOT DETECTED Final   Staphylococcus species NOT DETECTED NOT DETECTED Final   Staphylococcus aureus (BCID) NOT DETECTED NOT DETECTED Final   Staphylococcus epidermidis NOT DETECTED NOT DETECTED Final   Staphylococcus lugdunensis NOT DETECTED NOT DETECTED Final   Streptococcus species NOT DETECTED NOT DETECTED Final   Streptococcus agalactiae NOT DETECTED NOT DETECTED Final   Streptococcus pneumoniae NOT DETECTED NOT DETECTED Final   Streptococcus pyogenes NOT DETECTED NOT DETECTED Final   A.calcoaceticus-baumannii NOT DETECTED NOT DETECTED Final   Bacteroides fragilis NOT DETECTED NOT DETECTED Final   Enterobacterales DETECTED (A) NOT DETECTED Final    Comment: CRITICAL RESULT CALLED TO, READ BACK BY AND VERIFIED WITH: JASON ROBBINS 01/02/2022 AT 0406 SRR    Enterobacter cloacae complex  NOT DETECTED NOT DETECTED Final   Escherichia coli DETECTED (A) NOT DETECTED Final    Comment: CRITICAL RESULT CALLED TO, READ BACK BY AND VERIFIED WITH: JASON ROBBINS 01/02/2022 AT 0406 SRR    Klebsiella aerogenes DETECTED (A) NOT DETECTED Final    Comment: CRITICAL RESULT CALLED TO, READ BACK BY AND VERIFIED  WITH: JASON ROBBINS 01/02/2022 AT 0406 SRR    Klebsiella oxytoca NOT DETECTED NOT DETECTED Final   Klebsiella pneumoniae NOT DETECTED NOT DETECTED Final   Proteus species NOT DETECTED NOT DETECTED Final   Salmonella species NOT DETECTED NOT DETECTED Final   Serratia marcescens NOT DETECTED NOT DETECTED Final   Haemophilus influenzae NOT DETECTED NOT DETECTED Final   Neisseria meningitidis NOT DETECTED NOT DETECTED Final   Pseudomonas aeruginosa NOT DETECTED NOT DETECTED Final   Stenotrophomonas maltophilia NOT DETECTED NOT DETECTED Final   Candida albicans NOT DETECTED NOT DETECTED Final   Candida auris NOT DETECTED NOT DETECTED Final   Candida glabrata NOT DETECTED NOT DETECTED Final   Candida krusei NOT DETECTED NOT DETECTED Final   Candida parapsilosis NOT DETECTED NOT DETECTED Final   Candida tropicalis NOT DETECTED NOT DETECTED Final   Cryptococcus neoformans/gattii NOT DETECTED NOT DETECTED Final   CTX-M ESBL NOT DETECTED NOT DETECTED Final   Carbapenem resistance IMP NOT DETECTED NOT DETECTED Final   Carbapenem resistance KPC NOT DETECTED NOT DETECTED Final   Carbapenem resistance NDM NOT DETECTED NOT DETECTED Final   Carbapenem resist OXA 48 LIKE NOT DETECTED NOT DETECTED Final   Carbapenem resistance VIM NOT DETECTED NOT DETECTED Final    Comment: Performed at [facility], [address] [place], [place] [address]  Resp Panel by RT-PCR (Flu A&B, Covid) Anterior Nasal Swab     Status: None   Collection Time: 01/01/22  8:15 PM   Specimen: Anterior Nasal Swab  Result Value Ref Range Status   SARS Coronavirus 2 by RT PCR NEGATIVE NEGATIVE Final    Comment: (NOTE) SARS-CoV-2 target nucleic acids are NOT DETECTED.  The SARS-CoV-2 RNA is generally detectable in upper respiratory specimens during the acute phase of infection. The lowest concentration of SARS-CoV-2 viral copies this assay can detect is 138 copies/mL. A negative result does not preclude  SARS-Cov-2 infection and should not be used as the sole basis for treatment or other patient management decisions. A negative result may occur with  improper specimen collection/handling, submission of specimen other than nasopharyngeal swab, presence of viral mutation(s) within the areas targeted by this assay, and inadequate number of viral copies(<138 copies/mL). A negative result must be combined with clinical observations, patient history, and epidemiological information. The expected result is Negative.  Fact Sheet for Patients:  EntrepreneurPulse.com.au  Fact Sheet for Healthcare Providers:  IncredibleEmployment.be  This test is no t yet approved or cleared by the [place] FDA and  has been authorized for detection and/or diagnosis of SARS-CoV-2 by FDA under an Emergency Use Authorization (EUA). This EUA will remain  in effect (meaning this test can be used) for the duration of the COVID-19 declaration under Section 564(b)(1) of the Act, 21 U.S.C.section 360bbb-3(b)(1), unless the authorization is terminated  or revoked sooner.       Influenza A by PCR NEGATIVE NEGATIVE Final   Influenza B by PCR NEGATIVE NEGATIVE Final    Comment: (NOTE) The Xpert Xpress SARS-CoV-2/FLU/RSV plus assay is intended as an aid in the diagnosis of influenza from Nasopharyngeal swab specimens and should not be used as a sole basis  for treatment. Nasal washings and aspirates are unacceptable for Xpert Xpress SARS-CoV-2/FLU/RSV testing.  Fact Sheet for Patients: EntrepreneurPulse.com.au  Fact Sheet for Healthcare Providers: IncredibleEmployment.be  This test is not yet approved or cleared by the [place] FDA and has been authorized for detection and/or diagnosis of SARS-CoV-2 by FDA under an Emergency Use Authorization (EUA). This EUA will remain in effect (meaning this test can be used) for the duration of  the COVID-19 declaration under Section 564(b)(1) of the Act, 21 U.S.C. section 360bbb-3(b)(1), unless the authorization is terminated or revoked.  Performed at [facility], [address] [place], [place] [address]     Radiology Studies: NM Hepato W/EF  Result Date: 01/04/2022 CLINICAL DATA:  Abdominal pain.  Evaluate gallbladder motility. EXAM: NUCLEAR MEDICINE HEPATOBILIARY IMAGING WITH GALLBLADDER EF TECHNIQUE: Sequential images of the abdomen were obtained out to 60 minutes following intravenous administration of radiopharmaceutical. After oral ingestion of Ensure, gallbladder ejection fraction was determined. At 60 min, normal ejection fraction is greater than 33%. RADIOPHARMACEUTICALS:  5.13 mCi Tc-62m Choletec IV COMPARISON:  None Available. FINDINGS: Prompt uptake and biliary excretion of activity by the liver is seen. Gallbladder activity is visualized, consistent with patency of cystic duct. Biliary activity passes into small bowel, consistent with patent common bile duct. Calculated gallbladder ejection fraction is 16%. (Normal gallbladder ejection fraction with Ensure is greater than 33%.) IMPRESSION: 1. The gallbladder ejection fraction is abnormally low consistent with biliary dyskinesia. Electronically Signed   By: DDorise BullionIII M.D.   On: 01/04/2022 11:51    Scheduled Meds:  aspirin EC  81 mg Oral Daily   atorvastatin  10 mg Oral Daily   clopidogrel  75 mg Oral Daily   heparin  5,000 Units Subcutaneous Q8H   hydrALAZINE  25 mg Oral Q8H   insulin aspart  0-5 Units Subcutaneous QHS   insulin aspart  0-6 Units Subcutaneous TID WC   pantoprazole  40 mg Oral Daily   sodium chloride flush  3 mL Intravenous Q12H   Continuous Infusions:  ceFEPime (MAXIPIME) IV 2 g (01/05/22 0808)     LOS: 4 days    Time spent: 35 mins    Akili Cuda, MD [facility]   If 7PM-7AM, please contact night-coverage

## 2022-01-06 ENCOUNTER — Encounter: Payer: Self-pay | Admitting: Family Medicine

## 2022-01-06 ENCOUNTER — Inpatient Hospital Stay: Payer: Medicare Other | Admitting: Anesthesiology

## 2022-01-06 ENCOUNTER — Other Ambulatory Visit: Payer: Self-pay

## 2022-01-06 ENCOUNTER — Encounter
Admission: EM | Disposition: A | Discharge status: Home / Self Care | Payer: Self-pay | Source: Home / Self Care | Attending: Family Medicine

## 2022-01-06 DIAGNOSIS — R6510 Systemic inflammatory response syndrome (SIRS) of non-infectious origin without acute organ dysfunction: Secondary | ICD-10-CM | POA: Diagnosis not present

## 2022-01-06 DIAGNOSIS — K8010 Calculus of gallbladder with chronic cholecystitis without obstruction: Secondary | ICD-10-CM | POA: Diagnosis not present

## 2022-01-06 DIAGNOSIS — R160 Hepatomegaly, not elsewhere classified: Secondary | ICD-10-CM | POA: Diagnosis not present

## 2022-01-06 DIAGNOSIS — Z1619 Resistance to other specified beta lactam antibiotics: Secondary | ICD-10-CM

## 2022-01-06 DIAGNOSIS — K7460 Unspecified cirrhosis of liver: Secondary | ICD-10-CM | POA: Diagnosis not present

## 2022-01-06 DIAGNOSIS — A4151 Sepsis due to Escherichia coli [E. coli]: Secondary | ICD-10-CM | POA: Diagnosis not present

## 2022-01-06 HISTORY — PX: LIVER BIOPSY: SHX301

## 2022-01-06 LAB — BASIC METABOLIC PANEL
Anion gap: 10 (ref 5–15)
BUN: 30 mg/dL — ABNORMAL HIGH (ref 8–23)
CO2: 25 mmol/L (ref 22–32)
Calcium: 9.5 mg/dL (ref 8.9–10.3)
Chloride: 100 mmol/L (ref 98–111)
Creatinine, Ser: 1.28 mg/dL — ABNORMAL HIGH (ref 0.61–1.24)
GFR, Estimated: 53 mL/min — ABNORMAL LOW (ref >60)
Glucose, Bld: 188 mg/dL — ABNORMAL HIGH (ref 70–99)
Potassium: 4.6 mmol/L (ref 3.5–5.1)
Sodium: 135 mmol/L (ref 135–145)

## 2022-01-06 LAB — MAGNESIUM: Magnesium: 1.8 mg/dL (ref 1.7–2.4)

## 2022-01-06 LAB — CBC
HCT: 32 % — ABNORMAL LOW (ref 39.0–52.0)
Hemoglobin: 10.6 g/dL — ABNORMAL LOW (ref 13.0–17.0)
MCH: 27.5 pg (ref 26.0–34.0)
MCHC: 33.1 g/dL (ref 30.0–36.0)
MCV: 82.9 fL (ref 80.0–100.0)
Platelets: 104 10*3/uL — ABNORMAL LOW (ref 150–400)
RBC: 3.86 MIL/uL — ABNORMAL LOW (ref 4.22–5.81)
RDW: 17.9 % — ABNORMAL HIGH (ref 11.5–15.5)
WBC: 13.8 10*3/uL — ABNORMAL HIGH (ref 4.0–10.5)
nRBC: 0 % (ref 0.0–0.2)

## 2022-01-06 LAB — GLUCOSE, CAPILLARY
Glucose-Capillary: 192 mg/dL — ABNORMAL HIGH (ref 70–99)
Glucose-Capillary: 134 mg/dL — ABNORMAL HIGH (ref 70–99)
Glucose-Capillary: 162 mg/dL — ABNORMAL HIGH (ref 70–99)
Glucose-Capillary: 232 mg/dL — ABNORMAL HIGH (ref 70–99)

## 2022-01-06 LAB — PHOSPHORUS: Phosphorus: 3.5 mg/dL (ref 2.5–4.6)

## 2022-01-06 LAB — SURGICAL PCR SCREEN
MRSA, PCR: NEGATIVE
Staphylococcus aureus: POSITIVE — AB

## 2022-01-06 SURGERY — CHOLECYSTECTOMY, ROBOT-ASSISTED, LAPAROSCOPIC
Anesthesia: General | Site: Abdomen

## 2022-01-06 MED ORDER — FENTANYL CITRATE (PF) 100 MCG/2ML IJ SOLN
INTRAMUSCULAR | Status: AC
  Administered 2022-01-06: 25 ug via INTRAVENOUS
  Filled 2022-01-06: qty 2

## 2022-01-06 MED ORDER — BUPIVACAINE-EPINEPHRINE (PF) 0.25% -1:200000 IJ SOLN
INTRAMUSCULAR | Status: DC | PRN
  Administered 2022-01-06: 30 mL

## 2022-01-06 MED ORDER — SODIUM CHLORIDE 0.9 % IV SOLN: INTRAVENOUS | Status: DC

## 2022-01-06 MED ORDER — BUPIVACAINE LIPOSOME 1.3 % IJ SUSP
INTRAMUSCULAR | Status: AC
  Filled 2022-01-06: qty 20

## 2022-01-06 MED ORDER — OXYCODONE HCL 5 MG PO TABS
5.0000 mg | ORAL_TABLET | ORAL | Status: DC | PRN
  Administered 2022-01-06 – 2022-01-09 (×8): 10 mg via ORAL
  Filled 2022-01-06 (×8): qty 2

## 2022-01-06 MED ORDER — MUPIROCIN 2 % EX OINT
1.0000 | TOPICAL_OINTMENT | Freq: Two times a day (BID) | CUTANEOUS | Status: DC
  Administered 2022-01-07 – 2022-01-09 (×6): 1 via NASAL
  Filled 2022-01-06: qty 22

## 2022-01-06 MED ORDER — BUPIVACAINE-EPINEPHRINE (PF) 0.25% -1:200000 IJ SOLN
INTRAMUSCULAR | Status: AC
  Filled 2022-01-06: qty 30

## 2022-01-06 MED ORDER — FENTANYL CITRATE (PF) 100 MCG/2ML IJ SOLN
25.0000 ug | INTRAMUSCULAR | Status: AC | PRN
  Administered 2022-01-06 (×4): 25 ug via INTRAVENOUS

## 2022-01-06 MED ORDER — ACETAMINOPHEN 10 MG/ML IV SOLN
INTRAVENOUS | Status: DC | PRN
  Administered 2022-01-06: 1000 mg via INTRAVENOUS

## 2022-01-06 MED ORDER — PROPOFOL 10 MG/ML IV BOLUS
INTRAVENOUS | Status: DC | PRN
  Administered 2022-01-06: 130 mg via INTRAVENOUS

## 2022-01-06 MED ORDER — EPHEDRINE SULFATE (PRESSORS) 50 MG/ML IJ SOLN
INTRAMUSCULAR | Status: DC | PRN
  Administered 2022-01-06: 5 mg via INTRAVENOUS

## 2022-01-06 MED ORDER — DEXAMETHASONE SODIUM PHOSPHATE 10 MG/ML IJ SOLN
INTRAMUSCULAR | Status: DC | PRN
  Administered 2022-01-06: 10 mg via INTRAVENOUS

## 2022-01-06 MED ORDER — ONDANSETRON HCL 4 MG/2ML IJ SOLN
4.0000 mg | Freq: Four times a day (QID) | INTRAMUSCULAR | Status: DC | PRN
  Administered 2022-01-08: 4 mg via INTRAVENOUS
  Filled 2022-01-06: qty 2

## 2022-01-06 MED ORDER — CHLORHEXIDINE GLUCONATE CLOTH 2 % EX PADS
6.0000 | MEDICATED_PAD | Freq: Every day | CUTANEOUS | Status: DC
  Administered 2022-01-07 – 2022-01-09 (×3): 6 via TOPICAL

## 2022-01-06 MED ORDER — ONDANSETRON 4 MG PO TBDP: 4.0000 mg | ORAL_TABLET | Freq: Four times a day (QID) | ORAL | Status: DC | PRN

## 2022-01-06 MED ORDER — FENTANYL CITRATE (PF) 100 MCG/2ML IJ SOLN
INTRAMUSCULAR | Status: AC
  Filled 2022-01-06: qty 2

## 2022-01-06 MED ORDER — OXYCODONE HCL 5 MG PO TABS: 5.0000 mg | ORAL_TABLET | Freq: Once | ORAL | Status: DC | PRN

## 2022-01-06 MED ORDER — SEVOFLURANE IN SOLN
RESPIRATORY_TRACT | Status: AC
  Filled 2022-01-06: qty 250

## 2022-01-06 MED ORDER — ONDANSETRON HCL 4 MG/2ML IJ SOLN
4.0000 mg | Freq: Once | INTRAMUSCULAR | Status: AC | PRN
  Administered 2022-01-06: 4 mg via INTRAVENOUS

## 2022-01-06 MED ORDER — LIDOCAINE HCL (CARDIAC) PF 100 MG/5ML IV SOSY
PREFILLED_SYRINGE | INTRAVENOUS | Status: DC | PRN
  Administered 2022-01-06: 80 mg via INTRAVENOUS

## 2022-01-06 MED ORDER — HEMOSTATIC AGENTS (NO CHARGE) OPTIME
TOPICAL | Status: DC | PRN
  Administered 2022-01-06: 1 via TOPICAL

## 2022-01-06 MED ORDER — ACETAMINOPHEN 10 MG/ML IV SOLN
INTRAVENOUS | Status: AC
  Filled 2022-01-06: qty 100

## 2022-01-06 MED ORDER — ROCURONIUM BROMIDE 100 MG/10ML IV SOLN
INTRAVENOUS | Status: DC | PRN
  Administered 2022-01-06: 10 mg via INTRAVENOUS
  Administered 2022-01-06: 20 mg via INTRAVENOUS
  Administered 2022-01-06: 50 mg via INTRAVENOUS

## 2022-01-06 MED ORDER — FENTANYL CITRATE (PF) 100 MCG/2ML IJ SOLN
INTRAMUSCULAR | Status: DC | PRN
  Administered 2022-01-06 (×2): 50 ug via INTRAVENOUS

## 2022-01-06 MED ORDER — OXYCODONE HCL 5 MG/5ML PO SOLN: 5.0000 mg | Freq: Once | ORAL | Status: DC | PRN

## 2022-01-06 MED ORDER — PHENYLEPHRINE 80 MCG/ML (10ML) SYRINGE FOR IV PUSH (FOR BLOOD PRESSURE SUPPORT)
PREFILLED_SYRINGE | INTRAVENOUS | Status: DC | PRN
  Administered 2022-01-06 (×4): 80 ug via INTRAVENOUS

## 2022-01-06 MED ORDER — SUGAMMADEX SODIUM 200 MG/2ML IV SOLN
INTRAVENOUS | Status: DC | PRN
  Administered 2022-01-06: 200 mg via INTRAVENOUS

## 2022-01-06 MED ORDER — ONDANSETRON HCL 4 MG/2ML IJ SOLN
INTRAMUSCULAR | Status: AC
  Filled 2022-01-06: qty 2

## 2022-01-06 MED ORDER — MORPHINE SULFATE (PF) 2 MG/ML IV SOLN
2.0000 mg | INTRAVENOUS | Status: AC | PRN
  Administered 2022-01-06 – 2022-01-07 (×2): 2 mg via INTRAVENOUS
  Filled 2022-01-06 (×2): qty 1

## 2022-01-06 MED ORDER — PHENYLEPHRINE 80 MCG/ML (10ML) SYRINGE FOR IV PUSH (FOR BLOOD PRESSURE SUPPORT)
PREFILLED_SYRINGE | INTRAVENOUS | Status: AC
  Filled 2022-01-06: qty 10

## 2022-01-06 MED ORDER — ONDANSETRON HCL 4 MG/2ML IJ SOLN
INTRAMUSCULAR | Status: DC | PRN
  Administered 2022-01-06: 4 mg via INTRAVENOUS

## 2022-01-06 SURGICAL SUPPLY — 50 items
ADH SKN CLS APL DERMABOND .7 (GAUZE/BANDAGES/DRESSINGS) ×2
BAG PRESSURE INF REUSE 3000 (BAG) IMPLANT
BULB RESERV EVAC DRAIN JP 100C (MISCELLANEOUS) IMPLANT
CLIP LIGATING HEM O LOK PURPLE (MISCELLANEOUS) ×3 IMPLANT
COVER TIP SHEARS 8 DVNC (MISCELLANEOUS) ×3 IMPLANT
COVER TIP SHEARS 8MM DA VINCI (MISCELLANEOUS) ×2
DERMABOND ADVANCED .7 DNX12 (GAUZE/BANDAGES/DRESSINGS) ×3 IMPLANT
DRAIN CHANNEL JP 19F (MISCELLANEOUS) IMPLANT
DRAPE ARM DVNC X/XI (DISPOSABLE) ×12 IMPLANT
DRAPE COLUMN DVNC XI (DISPOSABLE) ×3 IMPLANT
DRAPE DA VINCI XI ARM (DISPOSABLE) ×8
DRAPE DA VINCI XI COLUMN (DISPOSABLE) ×2
DRSG TELFA 3X4 N-ADH STERILE (GAUZE/BANDAGES/DRESSINGS) IMPLANT
ELECT CAUTERY BLADE 6.4 (BLADE) ×3 IMPLANT
GLOVE ORTHO TXT STRL SZ7.5 (GLOVE) ×6 IMPLANT
GOWN STRL REUS W/ TWL LRG LVL3 (GOWN DISPOSABLE) ×6 IMPLANT
GOWN STRL REUS W/ TWL XL LVL3 (GOWN DISPOSABLE) ×6 IMPLANT
GOWN STRL REUS W/TWL LRG LVL3 (GOWN DISPOSABLE) ×4
GOWN STRL REUS W/TWL XL LVL3 (GOWN DISPOSABLE) ×4
GRASPER SUT TROCAR 14GX15 (MISCELLANEOUS) IMPLANT
HEMOSTAT ARISTA ABSORB 3G PWDR (HEMOSTASIS) IMPLANT
IRRIGATION STRYKERFLOW (MISCELLANEOUS) IMPLANT
IRRIGATOR STRYKERFLOW (MISCELLANEOUS)
IRRIGATOR SUCT 8 DISP DVNC XI (IRRIGATION / IRRIGATOR) IMPLANT
IRRIGATOR SUCTION 8MM XI DISP (IRRIGATION / IRRIGATOR)
IV NS IRRIG 3000ML ARTHROMATIC (IV SOLUTION) IMPLANT
KIT PINK PAD W/HEAD ARE REST (MISCELLANEOUS) ×2 IMPLANT
KIT PINK PAD W/HEAD ARM REST (MISCELLANEOUS) ×3 IMPLANT
KIT TURNOVER KIT A (KITS) ×3 IMPLANT
LABEL OR SOLS (LABEL) ×3 IMPLANT
MANIFOLD NEPTUNE II (INSTRUMENTS) ×3 IMPLANT
NDL INSUFFLATION 14GA 120MM (NEEDLE) IMPLANT
NEEDLE HYPO 22GX1.5 SAFETY (NEEDLE) ×3 IMPLANT
NEEDLE INSUFFLATION 14GA 120MM (NEEDLE) IMPLANT
NS IRRIG 500ML POUR BTL (IV SOLUTION) ×3 IMPLANT
PACK LAP CHOLECYSTECTOMY (MISCELLANEOUS) ×3 IMPLANT
SEAL CANN UNIV 5-8 DVNC XI (MISCELLANEOUS) ×12 IMPLANT
SEAL XI 5MM-8MM UNIVERSAL (MISCELLANEOUS) ×8
SET TUBE SMOKE EVAC HIGH FLOW (TUBING) ×3 IMPLANT
SOLUTION ELECTROLUBE (MISCELLANEOUS) ×3 IMPLANT
SPONGE DRAIN TRACH 4X4 STRL 2S (GAUZE/BANDAGES/DRESSINGS) IMPLANT
SUT ETHILON 3-0 FS-10 30 BLK (SUTURE) ×2
SUT MNCRL 4-0 (SUTURE) ×2
SUT MNCRL 4-0 27XMFL (SUTURE) ×2
SUT VICRYL 0 UR6 27IN ABS (SUTURE) ×3 IMPLANT
SUTURE EHLN 3-0 FS-10 30 BLK (SUTURE) IMPLANT
SUTURE MNCRL 4-0 27XMF (SUTURE) ×3 IMPLANT
SYS BAG RETRIEVAL 10MM (BASKET) ×2
SYSTEM BAG RETRIEVAL 10MM (BASKET) ×3 IMPLANT
TROCAR Z-THREAD FIOS 11X100 BL (TROCAR) IMPLANT

## 2022-01-06 NOTE — Progress Notes (Signed)
       CROSS COVER NOTE  NAME: HAIRO GARRAWAY MRN: [id number] DOB : [birth year] ATTENDING PHYSICIAN: Shawna Clamp, MD    Date of Service   01/06/2022   HPI/Events of Note   Medication request received for patient report of 8/10 post op abdominal pain refractory to Oxycodone.  Interventions   Assessment/Plan:  PRN Morphine     This document was prepared using Dragon voice recognition software and may include unintentional dictation errors.  Neomia Glass DNP, MBA, FNP-BC Nurse Practitioner [facility] Pager [phone]

## 2022-01-06 NOTE — Anesthesia Preprocedure Evaluation (Signed)
Anesthesia Evaluation  Patient identified by MRN, date of birth, ID band Patient awake    Reviewed: Allergy & Precautions, NPO status , Patient's Chart, lab work & pertinent test results  History of Anesthesia Complications Negative for: history of anesthetic complications  Airway Mallampati: III  TM Distance: >3 FB Neck ROM: Full    Dental  (+) Teeth Intact, Chipped   Pulmonary neg pulmonary ROS, neg sleep apnea, neg COPD, Patient abstained from smoking.Not current smoker   Pulmonary exam normal breath sounds clear to auscultation       Cardiovascular Exercise Tolerance: Good METShypertension, Pt. on medications + CAD and + CABG  (-) Past MI (-) dysrhythmias  Rhythm:Regular Rate:Normal - Systolic murmurs    Neuro/Psych  PSYCHIATRIC DISORDERS  Depression     Neuromuscular disease CVA, No Residual Symptoms    GI/Hepatic ,GERD  Medicated and Controlled,,(+)     (-) substance abuse    Endo/Other  diabetes, Well Controlled, Insulin Dependent    Renal/GU negative Renal ROS     Musculoskeletal   Abdominal   Peds  Hematology   Anesthesia Other Findings Past Medical History: No date: Actinic keratosis No date: Arrhythmia     Comment:  A-Fib No date: BPH (benign prostatic hyperplasia) 02/11/2007: Coronary artery disease     Comment:  CABG X 3  @ [facility] 02/11/2012: CVA (cerebral infarction) No date: Diabetes mellitus without complication (HCC) No date: Enlarged prostate No date: GERD (gastroesophageal reflux disease) No date: Hyperlipidemia No date: Hypertension No date: Kidney stone No date: Renal disorder No date: Skin cancer     Comment:  scalp, tx by Dr Sharlett Iles 2000s?  Reproductive/Obstetrics                              Anesthesia Physical Anesthesia Plan  ASA: 3  Anesthesia Plan: General   Post-op Pain Management: Ofirmev IV (intra-op)*   Induction: Intravenous  PONV  Risk Score and Plan: 2 and Ondansetron, Dexamethasone and Treatment may vary due to age or medical condition  Airway Management Planned: Oral ETT  Additional Equipment: None  Intra-op Plan:   Post-operative Plan: Extubation in OR  Informed Consent: I have reviewed the patients History and Physical, chart, labs and discussed the procedure including the risks, benefits and alternatives for the proposed anesthesia with the patient or authorized representative who has indicated his/her understanding and acceptance.     Dental advisory given  Plan Discussed with: CRNA and Surgeon  Anesthesia Plan Comments: (Discussed risks of anesthesia with patient, including PONV, sore throat, lip/dental/eye damage, post operative cognitive dysfunction. Rare risks discussed as well, such as cardiorespiratory and neurological sequelae, and allergic reactions. Discussed the role of CRNA in patient's perioperative care. Patient understands.)         Anesthesia Quick Evaluation

## 2022-01-06 NOTE — Transfer of Care (Signed)
Immediate Anesthesia Transfer of Care Note  Patient: Randy Frazier  Procedure(s) Performed: XI ROBOTIC ASSISTED LAPAROSCOPIC CHOLECYSTECTOMY (Abdomen) INDOCYANINE GREEN FLUORESCENCE IMAGING (ICG) (Abdomen) LIVER BIOPSY  Patient Location: PACU  Anesthesia Type:General  Level of Consciousness: drowsy  Airway & Oxygen Therapy: Patient Spontanous Breathing  Post-op Assessment: Report given to RN  Post vital signs: Reviewed  Last Vitals:  Vitals Value Taken Time  BP 164/79 01/06/22 1558  Temp 36.6 C 01/06/22 1558  Pulse 78 01/06/22 1602  Resp 18 01/06/22 1602  SpO2 97 % 01/06/22 1602  Vitals shown include unvalidated device data.  Last Pain:  Vitals:   01/06/22 1226  TempSrc: Temporal  PainSc: 0-No pain      Patients Stated Pain Goal: 0 (72/18/28 8337)  Complications: No notable events documented.

## 2022-01-06 NOTE — Progress Notes (Signed)
PROGRESS NOTE    Randy Frazier  VOJ:[id number] DOB: [birth year] DOA: 01/01/2022 PCP: Pcp, No    Brief Narrative: This 88 years old male with PMH significant for hypertension, insulin-dependent diabetes mellitus, CAD s/p CABG, history of CVA now presented in the ED with complaints of loss of appetite, shaking chills, upper abdominal pain since yesterday.  Patient has been complaining about some pain in the epigastric associated decreased p.o. intake denies any nausea vomiting.  He was febrile on arrival was given fluid resuscitation.  He was hypoxic requiring 2 L of supplemental oxygen.  CTA ruled out pulmonary embolism.  Blood cultures were collected and patient was admitted for sepsis secondary to unknown cause.  Assessment & Plan:   Principal Problem:   SIRS (systemic inflammatory response syndrome) (HCC) Active Problems:   Epigastric pain   Cirrhosis (HCC)   CAD, AUTOLOGOUS BYPASS GRAFT   DM type 2 with diabetic peripheral neuropathy (HCC)   History of CVA (cerebrovascular accident)   Liver mass   Acute respiratory failure with hypoxia (HCC)   AKI (acute kidney injury) ([place])  Suspected sepsis: E. Coli / Klebsiella bacteremia: Patient presented with chills, rigors, loss of appetite, upper abdominal pain and found to have fever with leukocytosis and elevated lactic acid. COVID and influenza PCR were negative and no evidence for infection seen on chest CT  Started on empiric antibiotics and was given IV fluid resuscitation. Lactic acid 4.2, improved with fluid resuscitation Procalcitonin 7.18 trended up to 22.03, WBC 19.2, UA : unremarkable. Continue current antibiotics Cefepime [place] RUQ: Gallstones and gallbladder sludge. Mild gallbladder wall Thickening.   Blood cultures are growing E. coli, enterococci and Klebsiella. Patient continues to have leukocytosis despite being on antibiotics. General surgery consulted for gallstones,  recommended HIDA scan. HIDA scan completed and  result consistent with biliary dyskinesia. General surgery recommended IR intervention for possible percutaneous drainage. Patient wants to have gallbladder removed,  General surgery notified.  He is scheduled for laparoscopic cholecystectomy today. Sepsis physiology improving.  Acute kidney injury: > Improving SCr is 1.60 on admission; baseline appears to be closer to 1.1  Likely prerenal in setting of suspected sepsis and anorexia with hypovolemia  Continue IVF hydration, hold ACE-i, renally-dose medications,  Serum creatinine improving 1.61> 1.41 >1.37>1.22 >1.14>1.28   Acute hypoxic respiratory failure: > Resolved. He was hypoxic requiring supplemental oxygen. He denies cough and there is no PE or acute pulmonary findings on CTA chest in ED  He is weaned down to room air.  Acute hypoxia resolved.   Cirrhosis /Liver masses Recent diagnosis, concerning for hepatocellular carcinoma, had initial GI consult this month and saw oncology for the first time on 12/31/21  Continue GI and oncology follow-up on discharge as planned.   Diabetes Mellitus HbA1c was 7.3% in October 2023  Continue regular insulin sliding scale   Essential hypertension Continue home medications.   Coronary artery disease- Continue statin and antiplatelets, hold beta-blocker initially given concern for evolving sepsis     History of CVA: Continue statin and antiplatelets   DVT prophylaxis: Heparin sq Code Status:Full code Family Communication: No family at bed side. Disposition Plan:   Status is: Inpatient Remains inpatient appropriate because: Admitted for suspected sepsis secondary to unknown cause.  Continue empiric antibiotics follow-up cultures.  He is scheduled for laparoscopic cholecystectomy given biliary dyskinesia.   Anticipated discharge home 01/08/2022.  Consultants:  General surgery  Procedures: CTA chest, HIDA scan, scheduled laparoscopic cholecystectomy today Antimicrobials:   Anti-infectives (From admission, onward)  Start     Dose/Rate Route Frequency Ordered Stop   01/03/22 2100  vancomycin (VANCOREADY) IVPB 1750 mg/350 mL  Status:  Discontinued        1,750 mg 175 mL/hr over 120 Minutes Intravenous Every 48 hours 01/02/22 0049 01/02/22 0053   01/03/22 2100  vancomycin (VANCOREADY) IVPB 1750 mg/350 mL  Status:  Discontinued        1,750 mg 175 mL/hr over 120 Minutes Intravenous Every 48 hours 01/02/22 0053 01/02/22 0449   01/02/22 0900  ceFEPIme (MAXIPIME) 2 g in sodium chloride 0.9 % 100 mL IVPB  Status:  Discontinued        2 g 200 mL/hr over 30 Minutes Intravenous Every 12 hours 01/02/22 0047 01/02/22 0053   01/02/22 0900  ceFEPIme (MAXIPIME) 2 g in sodium chloride 0.9 % 100 mL IVPB        2 g 200 mL/hr over 30 Minutes Intravenous Every 12 hours 01/02/22 0053 01/09/22 0859   01/02/22 0845  metroNIDAZOLE (FLAGYL) IVPB 500 mg  Status:  Discontinued        500 mg 100 mL/hr over 60 Minutes Intravenous Every 12 hours 01/01/22 2320 01/02/22 0448   01/01/22 2045  ceFEPIme (MAXIPIME) 2 g in sodium chloride 0.9 % 100 mL IVPB        2 g 200 mL/hr over 30 Minutes Intravenous  Once 01/01/22 2033 01/01/22 2150   01/01/22 2045  metroNIDAZOLE (FLAGYL) IVPB 500 mg        500 mg 100 mL/hr over 60 Minutes Intravenous  Once 01/01/22 2033 01/01/22 2200   01/01/22 2045  vancomycin (VANCOCIN) IVPB 1000 mg/200 mL premix  Status:  Discontinued        1,000 mg 200 mL/hr over 60 Minutes Intravenous  Once 01/01/22 2033 01/01/22 2037   01/01/22 2045  vancomycin (VANCOREADY) IVPB 1500 mg/300 mL        1,500 mg 150 mL/hr over 120 Minutes Intravenous  Once 01/01/22 2037 01/01/22 2301      Subjective: Patient was seen and examined at bedside. Overnight events noted.  Patient reports being better, blood pressure is improved. He denies any abdominal pain. He wants to have gallbladder removed. He is scheduled for laparoscopic cholecystectomy today given biliary  dyskinesia.   Objective: Vitals:   01/06/22 0126 01/06/22 0537 01/06/22 0808 01/06/22 1155  BP: (!) 147/74 (!) 169/82 (!) 160/90 (!) 155/87  Pulse: 69 72 75 74  Resp: '16 14 16 16  '$ Temp: 98.2 F (36.8 C) 97.9 F (36.6 C) 97.8 F (36.6 C) 98.3 F (36.8 C)  TempSrc: Oral Oral  Oral  SpO2: 97% 97% 97% 100%  Weight:  79.1 kg    Height:        Intake/Output Summary (Last 24 hours) at 01/06/2022 1208 Last data filed at 01/06/2022 1100 Gross per 24 hour  Intake 690 ml  Output 200 ml  Net 490 ml   Filed Weights   01/04/22 0340 01/05/22 0500 01/06/22 0537  Weight: 78.5 kg 78.1 kg 79.1 kg    Examination:  General exam: Appears comfortable, not in any acute distress.  Deconditioned. Respiratory system: CTA bilaterally, no wheezing, no crackles, normal respiratory effort. Cardiovascular system: S1 & S2 heard, regular rate and rhythm, no murmur. Gastrointestinal system: Abdomen is soft, mildly sore in upper abdomen, non distended, BS+ Central nervous system: Alert and oriented x3, no focal neurological deficits. Extremities:  No edema, no cyanosis, no clubbing Skin: No rashes, lesions or ulcers Psychiatry: Judgement and insight  appear normal. Mood & affect appropriate.     Data Reviewed: I have personally reviewed following labs and imaging studies  CBC: Recent Labs  Lab 01/01/22 2007 01/02/22 0519 01/03/22 0406 01/04/22 0413 01/05/22 0414 01/06/22 0410  WBC 15.9* 19.2* 23.0* 15.8* 13.8* 13.8*  NEUTROABS 10.2*  --   --   --   --   --   HGB 11.5* 10.4* 9.9* 10.4* 11.0* 10.6*  HCT 34.5* 31.2* 29.7* 31.1* 32.4* 32.0*  MCV 83.7 83.9 83.7 82.3 81.8 82.9  PLT 179 134* 109* 108* 102* 578*   Basic Metabolic Panel: Recent Labs  Lab 01/02/22 0519 01/03/22 0406 01/04/22 0413 01/05/22 0414 01/06/22 0410  NA 134* 135 130* 136 135  K 4.1 4.2 4.0 4.3 4.6  CL 104 104 100 104 100  CO2 20* '23 22 25 25  '$ GLUCOSE 164* 138* 177* 178* 188*  BUN 27* 33* 31* 26* 30*  CREATININE  1.41* 1.37* 1.22 1.14 1.28*  CALCIUM 8.5* 8.3* 8.7* 9.3 9.5  MG  --   --   --  1.8 1.8  PHOS  --   --   --  3.4 3.5   GFR: Estimated Creatinine Clearance: 41.3 mL/min (A) (by C-G formula based on SCr of 1.28 mg/dL (H)). Liver Function Tests: Recent Labs  Lab 01/01/22 2007 01/02/22 0519 01/03/22 0406 01/04/22 0413  AST 47* 44* 72* 61*  ALT '25 21 26 30  '$ ALKPHOS 121 109 87 126  BILITOT 1.7* 1.2 0.8 0.8  PROT 7.4 6.8 6.6 7.2  ALBUMIN 3.1* 2.9* 2.8* 2.9*   No results for input(s): "LIPASE", "AMYLASE" in the last 168 hours. No results for input(s): "AMMONIA" in the last 168 hours. Coagulation Profile: Recent Labs  Lab 12/31/21 1409  INR 1.1   Cardiac Enzymes: No results for input(s): "CKTOTAL", "CKMB", "CKMBINDEX", "TROPONINI" in the last 168 hours. BNP (last 3 results) No results for input(s): "PROBNP" in the last 8760 hours. HbA1C: No results for input(s): "HGBA1C" in the last 72 hours. CBG: Recent Labs  Lab 01/05/22 1204 01/05/22 1703 01/05/22 2050 01/06/22 0812 01/06/22 1202  GLUCAP 133* 171* 176* 192* 134*   Lipid Profile: No results for input(s): "CHOL", "HDL", "LDLCALC", "TRIG", "CHOLHDL", "LDLDIRECT" in the last 72 hours. Thyroid Function Tests: No results for input(s): "TSH", "T4TOTAL", "FREET4", "T3FREE", "THYROIDAB" in the last 72 hours. Anemia Panel: No results for input(s): "VITAMINB12", "FOLATE", "FERRITIN", "TIBC", "IRON", "RETICCTPCT" in the last 72 hours. Sepsis Labs: Recent Labs  Lab 01/01/22 2007 01/02/22 0058 01/02/22 0519 01/02/22 1710 01/03/22 0406 01/05/22 0414  PROCALCITON  --  7.18 22.03  --  21.59 5.49  LATICACIDVEN 4.5* 4.2*  --  1.4  --   --     Recent Results (from the past 240 hour(s))  Blood Culture (routine x 2)     Status: Abnormal   Collection Time: 01/01/22  8:07 PM   Specimen: BLOOD RIGHT ARM  Result Value Ref Range Status   Specimen Description   Final    BLOOD RIGHT ARM Performed at [facility], [address] [place], [place] [address]    Special Requests   Final    BOTTLES DRAWN AEROBIC AND ANAEROBIC Blood Culture results may not be optimal due to an excessive volume of blood received in culture bottles Performed at [facility], [address], [place],  [address]    Culture  Setup Time   Final    GRAM NEGATIVE RODS IN BOTH AEROBIC AND ANAEROBIC BOTTLES  CRITICAL RESULT CALLED TO, READ BACK BY AND VERIFIED WITH: JASON ROBBINS 01/02/2022 AT 0406 SRR Performed at [facility], [address] [place], [place] [address]    Culture (A)  Final    ESCHERICHIA COLI ENTEROBACTER AEROGENES SUSCEPTIBILITIES PERFORMED ON PREVIOUS CULTURE WITHIN THE LAST 5 DAYS. Performed at [facility], [place] [address] [place], [place] [address]    Report Status 01/05/2022 FINAL  Final  Blood Culture (routine x 2)     Status: Abnormal   Collection Time: 01/01/22  8:12 PM   Specimen: BLOOD LEFT ARM  Result Value Ref Range Status   Specimen Description   Final    BLOOD LEFT ARM Performed at [facility], [address] [place], [place] [address]    Special Requests   Final    Blood Culture results may not be optimal due to an inadequate volume of blood received in culture bottles Performed at [facility], [address], [place], [place] [address]    Culture  Setup Time   Final    GRAM NEGATIVE RODS IN BOTH AEROBIC AND ANAEROBIC BOTTLES Organism ID to follow CRITICAL RESULT CALLED TO, READ BACK BY AND VERIFIED WITH: JASON ROBBIN 01/02/2022 AT 0406 SRR Performed at [facility], [address] [place], [place] [address]    Culture ESCHERICHIA COLI ENTEROBACTER AEROGENES  (A)  Final   Report Status 01/05/2022 FINAL  Final   Organism ID, Bacteria ESCHERICHIA COLI  Final   Organism ID, Bacteria ENTEROBACTER AEROGENES  Final      Susceptibility   Enterobacter aerogenes - MIC*    CEFAZOLIN >=64 RESISTANT Resistant     CEFEPIME  <=0.12 SENSITIVE Sensitive     CEFTAZIDIME <=1 SENSITIVE Sensitive     CEFTRIAXONE <=0.25 SENSITIVE Sensitive     CIPROFLOXACIN <=0.25 SENSITIVE Sensitive     GENTAMICIN <=1 SENSITIVE Sensitive     IMIPENEM 2 SENSITIVE Sensitive     TRIMETH/SULFA <=20 SENSITIVE Sensitive     PIP/TAZO <=4 SENSITIVE Sensitive     * ENTEROBACTER AEROGENES   Escherichia coli - MIC*    AMPICILLIN <=2 SENSITIVE Sensitive     CEFAZOLIN <=4 SENSITIVE Sensitive     CEFEPIME <=0.12 SENSITIVE Sensitive     CEFTAZIDIME <=1 SENSITIVE Sensitive     CEFTRIAXONE <=0.25 SENSITIVE Sensitive     CIPROFLOXACIN <=0.25 SENSITIVE Sensitive     GENTAMICIN <=1 SENSITIVE Sensitive     IMIPENEM <=0.25 SENSITIVE Sensitive     TRIMETH/SULFA <=20 SENSITIVE Sensitive     AMPICILLIN/SULBACTAM <=2 SENSITIVE Sensitive     PIP/TAZO <=4 SENSITIVE Sensitive     * ESCHERICHIA COLI  Blood Culture ID Panel (Reflexed)     Status: Abnormal   Collection Time: 01/01/22  8:12 PM  Result Value Ref Range Status   Enterococcus faecalis NOT DETECTED NOT DETECTED Final   Enterococcus Faecium NOT DETECTED NOT DETECTED Final   Listeria monocytogenes NOT DETECTED NOT DETECTED Final   Staphylococcus species NOT DETECTED NOT DETECTED Final   Staphylococcus aureus (BCID) NOT DETECTED NOT DETECTED Final   Staphylococcus epidermidis NOT DETECTED NOT DETECTED Final   Staphylococcus lugdunensis NOT DETECTED NOT DETECTED Final   Streptococcus species NOT DETECTED NOT DETECTED Final   Streptococcus agalactiae NOT DETECTED NOT DETECTED Final   Streptococcus pneumoniae NOT DETECTED NOT DETECTED Final   Streptococcus pyogenes NOT DETECTED NOT DETECTED Final   A.calcoaceticus-baumannii NOT DETECTED NOT DETECTED Final   Bacteroides fragilis NOT DETECTED NOT DETECTED Final  Enterobacterales DETECTED (A) NOT DETECTED Final    Comment: CRITICAL RESULT CALLED TO, READ BACK BY AND VERIFIED WITH: JASON ROBBINS 01/02/2022 AT 0406 SRR    Enterobacter cloacae  complex NOT DETECTED NOT DETECTED Final   Escherichia coli DETECTED (A) NOT DETECTED Final    Comment: CRITICAL RESULT CALLED TO, READ BACK BY AND VERIFIED WITH: JASON ROBBINS 01/02/2022 AT 0406 SRR    Klebsiella aerogenes DETECTED (A) NOT DETECTED Final    Comment: CRITICAL RESULT CALLED TO, READ BACK BY AND VERIFIED WITH: JASON ROBBINS 01/02/2022 AT 0406 SRR    Klebsiella oxytoca NOT DETECTED NOT DETECTED Final   Klebsiella pneumoniae NOT DETECTED NOT DETECTED Final   Proteus species NOT DETECTED NOT DETECTED Final   Salmonella species NOT DETECTED NOT DETECTED Final   Serratia marcescens NOT DETECTED NOT DETECTED Final   Haemophilus influenzae NOT DETECTED NOT DETECTED Final   Neisseria meningitidis NOT DETECTED NOT DETECTED Final   Pseudomonas aeruginosa NOT DETECTED NOT DETECTED Final   Stenotrophomonas maltophilia NOT DETECTED NOT DETECTED Final   Candida albicans NOT DETECTED NOT DETECTED Final   Candida auris NOT DETECTED NOT DETECTED Final   Candida glabrata NOT DETECTED NOT DETECTED Final   Candida krusei NOT DETECTED NOT DETECTED Final   Candida parapsilosis NOT DETECTED NOT DETECTED Final   Candida tropicalis NOT DETECTED NOT DETECTED Final   Cryptococcus neoformans/gattii NOT DETECTED NOT DETECTED Final   CTX-M ESBL NOT DETECTED NOT DETECTED Final   Carbapenem resistance IMP NOT DETECTED NOT DETECTED Final   Carbapenem resistance KPC NOT DETECTED NOT DETECTED Final   Carbapenem resistance NDM NOT DETECTED NOT DETECTED Final   Carbapenem resist OXA 48 LIKE NOT DETECTED NOT DETECTED Final   Carbapenem resistance VIM NOT DETECTED NOT DETECTED Final    Comment: Performed at [facility], [address] [place], [place] [address]  Resp Panel by RT-PCR (Flu A&B, Covid) Anterior Nasal Swab     Status: None   Collection Time: 01/01/22  8:15 PM   Specimen: Anterior Nasal Swab  Result Value Ref Range Status   SARS Coronavirus 2 by RT PCR NEGATIVE NEGATIVE Final     Comment: (NOTE) SARS-CoV-2 target nucleic acids are NOT DETECTED.  The SARS-CoV-2 RNA is generally detectable in upper respiratory specimens during the acute phase of infection. The lowest concentration of SARS-CoV-2 viral copies this assay can detect is 138 copies/mL. A negative result does not preclude SARS-Cov-2 infection and should not be used as the sole basis for treatment or other patient management decisions. A negative result may occur with  improper specimen collection/handling, submission of specimen other than nasopharyngeal swab, presence of viral mutation(s) within the areas targeted by this assay, and inadequate number of viral copies(<138 copies/mL). A negative result must be combined with clinical observations, patient history, and epidemiological information. The expected result is Negative.  Fact Sheet for Patients:  EntrepreneurPulse.com.au  Fact Sheet for Healthcare Providers:  IncredibleEmployment.be  This test is no t yet approved or cleared by the [place] FDA and  has been authorized for detection and/or diagnosis of SARS-CoV-2 by FDA under an Emergency Use Authorization (EUA). This EUA will remain  in effect (meaning this test can be used) for the duration of the COVID-19 declaration under Section 564(b)(1) of the Act, 21 U.S.C.section 360bbb-3(b)(1), unless the authorization is terminated  or revoked sooner.       Influenza A by PCR NEGATIVE NEGATIVE Final   Influenza B by PCR NEGATIVE NEGATIVE Final  Comment: (NOTE) The Xpert Xpress SARS-CoV-2/FLU/RSV plus assay is intended as an aid in the diagnosis of influenza from Nasopharyngeal swab specimens and should not be used as a sole basis for treatment. Nasal washings and aspirates are unacceptable for Xpert Xpress SARS-CoV-2/FLU/RSV testing.  Fact Sheet for Patients: EntrepreneurPulse.com.au  Fact Sheet for Healthcare  Providers: IncredibleEmployment.be  This test is not yet approved or cleared by the [place] FDA and has been authorized for detection and/or diagnosis of SARS-CoV-2 by FDA under an Emergency Use Authorization (EUA). This EUA will remain in effect (meaning this test can be used) for the duration of the COVID-19 declaration under Section 564(b)(1) of the Act, 21 U.S.C. section 360bbb-3(b)(1), unless the authorization is terminated or revoked.  Performed at [facility], [address] [place], [place] [address]     Radiology Studies: No results found.  Scheduled Meds:  aspirin EC  81 mg Oral Daily   atorvastatin  10 mg Oral Daily   hydrALAZINE  25 mg Oral Q8H   indocyanine green  2.5 mg Intravenous Once   insulin aspart  0-5 Units Subcutaneous QHS   insulin aspart  0-6 Units Subcutaneous TID WC   pantoprazole  40 mg Oral Daily   sodium chloride flush  3 mL Intravenous Q12H   Continuous Infusions:  ceFEPime (MAXIPIME) IV 2 g (01/06/22 0915)     LOS: 5 days    Time spent: 35 mins    Yamileth Hayse, MD [facility]   If 7PM-7AM, please contact night-coverage

## 2022-01-06 NOTE — H&P (View-Only) (Signed)
[facility] Day(s): 5.   Interval History:  Patient seen and examined No acute events or new complaints overnight.  Patient reports he is doing well No significant abdominal pain; nausea, emesis Mild leukocytosis; 13.8K Hgb stable to 10.6 Renal function seems to be stable; sCr - 1.28 No significant electrolyte derangements NPO Continues on Cefepime   Vital signs in last 24 hours: [min-max] current  Temp:  [97.7 F (36.5 C)-98.2 F (36.8 C)] 97.9 F (36.6 C) (11/27 0537) Pulse Rate:  [63-81] 72 (11/27 0537) Resp:  [14-18] 14 (11/27 0537) BP: (142-169)/(69-82) 169/82 (11/27 0537) SpO2:  [96 %-99 %] 97 % (11/27 0537) Weight:  [79.1 kg] 79.1 kg (11/27 0537)     Height: 6' (182.9 cm) Weight: 79.1 kg BMI (Calculated): 23.65   Intake/Output last 2 shifts:  11/26 0701 - 11/27 0700 In: 690 [P.O.:390; IV Piggyback:300] Out: -    Physical Exam:  Constitutional: alert, cooperative and no distress  Respiratory: breathing non-labored at rest  Cardiovascular: regular rate and sinus rhythm  Gastrointestinal: soft, non-tender, and non-distended Integumentary: warm, dry  Labs:     Latest Ref Rng & Units 01/06/2022    4:10 AM 01/05/2022    4:14 AM 01/04/2022    4:13 AM  CBC  WBC 4.0 - 10.5 K/uL 13.8  13.8  15.8   Hemoglobin 13.0 - 17.0 g/dL 10.6  11.0  10.4   Hematocrit 39.0 - 52.0 % 32.0  32.4  31.1   Platelets 150 - 400 K/uL 104  102  108       Latest Ref Rng & Units 01/06/2022    4:10 AM 01/05/2022    4:14 AM 01/04/2022    4:13 AM  CMP  Glucose 70 - 99 mg/dL 188  178  177   BUN 8 - 23 mg/dL '30  26  31   '$ Creatinine 0.61 - 1.24 mg/dL 1.28  1.14  1.22   Sodium 135 - 145 mmol/L 135  136  130   Potassium 3.5 - 5.1 mmol/L 4.6  4.3  4.0   Chloride 98 - 111 mmol/L 100  104  100   CO2 22 - 32 mmol/L '25  25  22   '$ Calcium 8.9 - 10.3 mg/dL 9.5  9.3  8.7   Total Protein 6.5 - 8.1 g/dL   7.2   Total Bilirubin 0.3 - 1.2 mg/dL   0.8    Alkaline Phos 38 - 126 U/L   126   AST 15 - 41 U/L   61   ALT 0 - 44 U/L   30      Imaging studies: No new pertinent imaging studies   Assessment/Plan:  88 y.o. male with concerns for sepsis; unclear source but BCx +; but with HIDA showing biliary dyskinesia    - Plan for robotic assisted laparoscopic cholecystectomy with Dr Christian Mate today pending OR/Anesthesia availability - All risks, benefits, and alternatives to above procedure(s) were discussed with the patient, all of his questions were answered to his expressed satisfaction, patient expresses he wishes to proceed, and informed consent was obtained.    - NPO + IVF Resuscitation   - IV Abx (Cefepime) - ICG on call to OR   - Monitor abdominal examination - Pain control prn; antiemetics prn - Mobilize as feasible - Further management per primary service; we will follow    All of the above findings and recommendations were discussed with the patient, patient's family (son at bedside),  and the medical team, and all of patient's and family's questions were answered to their expressed satisfaction.  -- Edison Simon, PA-C [facility] 01/06/2022, 7:36 AM M-F: 7am - 4pm

## 2022-01-06 NOTE — Op Note (Signed)
Robotic cholecystectomy with Indocyamine Green Ductal Imaging, and Liver Biopsy.   Pre-operative Diagnosis: Chronic calculus cholecystitis, hepatic masses, cirrhosis.  Post-operative Diagnosis:  Same.  Procedure: Robotic assisted laparoscopic cholecystectomy with Indocyamine Green Ductal Imaging.   Surgeon: Ronny Bacon, M.D., FACS  Anesthesia: General. with endotracheal tube  Findings: see photos.    Estimated Blood Loss: 25 mL         Drains: None         Specimens: Gallbladder           Complications: none  Procedure Details  The patient was seen again in the Holding Room.  2.5 mg dose of ICG was administered intravenously.   The benefits, complications, treatment options, risks and expected outcomes were again reviewed with the patient. The likelihood of improving the patient's symptoms with return to their baseline status is good.  The patient and/or family concurred with the proposed plan, giving informed consent, again alternatives reviewed.  The patient was taken to Operating Room, identified, and the procedure verified as robotic assisted laparoscopic cholecystectomy.  Prior to the induction of general anesthesia, antibiotic prophylaxis was administered. VTE prophylaxis was in place. General endotracheal anesthesia was then administered and tolerated well. The patient was positioned in the supine position.  After the induction, the abdomen was prepped with Chloraprep and draped in the sterile fashion.  A Time Out was held and the above information confirmed.  Right para-umbilical local infiltration with quarter percent Marcaine with epinephrine is utilized.  Made a 12 mm incision on the right periumbilical site, I advanced an optical 65m port under direct visualization into the peritoneal cavity.  Once the peritoneum was penetrated, insufflation was initiated.  The trocar was then advanced into the abdominal cavity under direct visualization. Pneumoperitoneum was then  continued utilizing CO2 at 15 mmHg or less and tolerated well without any adverse changes in the patient's vital signs.  Two 8.5-mm ports were placed in the left lower quadrant and laterally, and one to the right lower quadrant, all under direct vision. All skin incisions  were infiltrated with a local anesthetic agent before making the incision and placing the trocars.  The patient was positioned  in reverse Trendelenburg, tilted the patient's left side down.  Da Vinci XI robot was then positioned on to the patient's left side, and docked.  The gallbladder was identified, the fundus grasped via the arm 4 Prograsp and retracted cephalad. Adhesions were lysed with scissors and cautery.  The infundibulum was identified grasped and retracted laterally, exposing the peritoneum overlying the triangle of Calot. This was then opened and dissected using cautery & scissors. An extended critical view of the cystic duct and cystic artery was obtained, aided by the ICG via FireFly which improved localization of the ductal anatomy.    The cystic duct was clearly identified and dissected to isolation.   The cystic duct was triple clipped and divided with scissors, as close to the gallbladder neck as feasible, thus leaving two on the remaining stump.  The specimen side of the artery is sealed with bipolar and divided with monopolar scissors.   The gallbladder was taken from the gallbladder fossa in a retrograde fashion with the electrocautery. The gallbladder was removed and placed in an Endocatch bag.  The liver bed is inspected. Hemostasis was confirmed.   I then proceeded to further evaluate the liver for an adequate biopsy site.  There was a small focus anteriorly in the left lobe which was suspicious and I proceeded with  utilizing the monopolar scissors to excise a viable portion of tissue from the surface.  Hemostasis maintained.  Further posterior on the left lobe there was some omental adhesive process with the  capsule.  I lysed these adhesions carefully, and identified the underlying mass consistent with what was seen/appreciated on MRI imaging.  It was apparent that this mass was somewhat cystic, having a soft diminished integrity compared to the adjacent hepatic tissues.  I cautiously proceeded with utilizing the monopolar scissors cutting into this area, confirming the mushy softness of probable necrotic tumor.  Some specimens of the capsule and some of the necrotic tumor were removed, uncertain if it will provide any additional yield to the prior biopsy.  Hemostasis was controlled well with bipolar cautery.  As were some residual oozing of this interrupted friable tissue I felt it prudent to proceed with utilization of Arista and placement of a drain in this area as a precautionary measure.  The robot was undocked and moved away from the operative field. Saline irrigation was utilized, prior to application of Arista to the left superior liver site and segment 2..  The gallbladder, with liver biopsy specimens and Endocatch sac were then removed through the infraumbilical port site.   Inspection of the right upper quadrant was performed. No bleeding, bile duct injury or leak, or bowel injury was noted. The infra-umbilical port site fascia was closed with interrumpted 0 Vicryl sutures using PMI/cone under direct visualization. Pneumoperitoneum was released and ports removed.  4-0 subcuticular Monocryl was used to close the skin. Dermabond was  applied.  The patient was then extubated and brought to the recovery room in stable condition. Sponge, lap, and needle counts were correct at closure and at the conclusion of the case.               Ronny Bacon, M.D., [facility] 01/06/2022 4:00 PM

## 2022-01-06 NOTE — Progress Notes (Signed)
Patient initially returned from the PACU quite frustrated with the amount of pain he was experiencing post procedure. His frustration has improved and patient answering questions appropriately and is cooperative with nursing staff. Some tea offered to patient and fresh ice pack provided by nurse tech. Pain medication given and patient repositioned a bit to help with comfort.

## 2022-01-06 NOTE — Interval H&P Note (Signed)
History and Physical Interval Note:  01/06/2022 2:02 PM  Randy Frazier  has presented today for surgery, with the diagnosis of cholecystitis with calculus.  The various methods of treatment have been discussed with the patient and family. After consideration of risks, benefits and other options for treatment, the patient has consented to  Procedure(s): XI ROBOTIC ASSISTED LAPAROSCOPIC CHOLECYSTECTOMY (N/A) [place] (ICG) (N/A) as a surgical intervention.  The patient's history has been reviewed, patient examined, no change in status, stable for surgery.  I have reviewed the patient's chart and labs.  Questions were answered to the patient's satisfaction.     Ronny Bacon

## 2022-01-06 NOTE — Progress Notes (Signed)
[facility] Day(s): 5.   Interval History:  Patient seen and examined No acute events or new complaints overnight.  Patient reports he is doing well No significant abdominal pain; nausea, emesis Mild leukocytosis; 13.8K Hgb stable to 10.6 Renal function seems to be stable; sCr - 1.28 No significant electrolyte derangements NPO Continues on Cefepime   Vital signs in last 24 hours: [min-max] current  Temp:  [97.7 F (36.5 C)-98.2 F (36.8 C)] 97.9 F (36.6 C) (11/27 0537) Pulse Rate:  [63-81] 72 (11/27 0537) Resp:  [14-18] 14 (11/27 0537) BP: (142-169)/(69-82) 169/82 (11/27 0537) SpO2:  [96 %-99 %] 97 % (11/27 0537) Weight:  [79.1 kg] 79.1 kg (11/27 0537)     Height: 6' (182.9 cm) Weight: 79.1 kg BMI (Calculated): 23.65   Intake/Output last 2 shifts:  11/26 0701 - 11/27 0700 In: 690 [P.O.:390; IV Piggyback:300] Out: -    Physical Exam:  Constitutional: alert, cooperative and no distress  Respiratory: breathing non-labored at rest  Cardiovascular: regular rate and sinus rhythm  Gastrointestinal: soft, non-tender, and non-distended Integumentary: warm, dry  Labs:     Latest Ref Rng & Units 01/06/2022    4:10 AM 01/05/2022    4:14 AM 01/04/2022    4:13 AM  CBC  WBC 4.0 - 10.5 K/uL 13.8  13.8  15.8   Hemoglobin 13.0 - 17.0 g/dL 10.6  11.0  10.4   Hematocrit 39.0 - 52.0 % 32.0  32.4  31.1   Platelets 150 - 400 K/uL 104  102  108       Latest Ref Rng & Units 01/06/2022    4:10 AM 01/05/2022    4:14 AM 01/04/2022    4:13 AM  CMP  Glucose 70 - 99 mg/dL 188  178  177   BUN 8 - 23 mg/dL '30  26  31   '$ Creatinine 0.61 - 1.24 mg/dL 1.28  1.14  1.22   Sodium 135 - 145 mmol/L 135  136  130   Potassium 3.5 - 5.1 mmol/L 4.6  4.3  4.0   Chloride 98 - 111 mmol/L 100  104  100   CO2 22 - 32 mmol/L '25  25  22   '$ Calcium 8.9 - 10.3 mg/dL 9.5  9.3  8.7   Total Protein 6.5 - 8.1 g/dL   7.2   Total Bilirubin 0.3 - 1.2 mg/dL   0.8    Alkaline Phos 38 - 126 U/L   126   AST 15 - 41 U/L   61   ALT 0 - 44 U/L   30      Imaging studies: No new pertinent imaging studies   Assessment/Plan:  88 y.o. male with concerns for sepsis; unclear source but BCx +; but with HIDA showing biliary dyskinesia    - Plan for robotic assisted laparoscopic cholecystectomy with Dr Christian Mate today pending OR/Anesthesia availability - All risks, benefits, and alternatives to above procedure(s) were discussed with the patient, all of his questions were answered to his expressed satisfaction, patient expresses he wishes to proceed, and informed consent was obtained.    - NPO + IVF Resuscitation   - IV Abx (Cefepime) - ICG on call to OR   - Monitor abdominal examination - Pain control prn; antiemetics prn - Mobilize as feasible - Further management per primary service; we will follow    All of the above findings and recommendations were discussed with the patient, patient's family (son at bedside),  and the medical team, and all of patient's and family's questions were answered to their expressed satisfaction.  -- Edison Simon, PA-C [facility] 01/06/2022, 7:36 AM M-F: 7am - 4pm

## 2022-01-06 NOTE — Anesthesia Procedure Notes (Signed)
Procedure Name: Intubation Date/Time: 01/06/2022 2:16 PM  Performed by: Arita Miss, MDPre-anesthesia Checklist: Patient identified, Emergency Drugs available, Suction available and Patient being monitored Patient Re-evaluated:Patient Re-evaluated prior to induction Oxygen Delivery Method: Circle system utilized Preoxygenation: Pre-oxygenation with 100% oxygen Induction Type: IV induction Ventilation: Mask ventilation without difficulty Laryngoscope Size: McGraph and 3 Grade View: Grade I Tube type: Oral Tube size: 7.5 mm Number of attempts: 1 Airway Equipment and Method: Stylet, Oral airway and Bite block Placement Confirmation: ETT inserted through vocal cords under direct vision, positive ETCO2 and breath sounds checked- equal and bilateral Secured at: 22 cm Tube secured with: Tape Dental Injury: Teeth and Oropharynx as per pre-operative assessment

## 2022-01-06 NOTE — Progress Notes (Signed)
Agitation has decreased but patient continues to refuse to answer orientation questions. Moves all extremities. Per Dr. Danne Baxter patient may return to room on floor.

## 2022-01-07 ENCOUNTER — Encounter: Payer: Self-pay | Admitting: Surgery

## 2022-01-07 DIAGNOSIS — R6510 Systemic inflammatory response syndrome (SIRS) of non-infectious origin without acute organ dysfunction: Secondary | ICD-10-CM | POA: Diagnosis not present

## 2022-01-07 LAB — PHOSPHORUS: Phosphorus: 4.4 mg/dL (ref 2.5–4.6)

## 2022-01-07 LAB — CBC
HCT: 33.5 % — ABNORMAL LOW (ref 39.0–52.0)
Hemoglobin: 11.1 g/dL — ABNORMAL LOW (ref 13.0–17.0)
MCH: 27.5 pg (ref 26.0–34.0)
MCHC: 33.1 g/dL (ref 30.0–36.0)
MCV: 82.9 fL (ref 80.0–100.0)
Platelets: 113 10*3/uL — ABNORMAL LOW (ref 150–400)
RBC: 4.04 MIL/uL — ABNORMAL LOW (ref 4.22–5.81)
RDW: 17.8 % — ABNORMAL HIGH (ref 11.5–15.5)
WBC: 16.3 10*3/uL — ABNORMAL HIGH (ref 4.0–10.5)
nRBC: 0 % (ref 0.0–0.2)

## 2022-01-07 LAB — BASIC METABOLIC PANEL
Anion gap: 8 (ref 5–15)
BUN: 25 mg/dL — ABNORMAL HIGH (ref 8–23)
CO2: 24 mmol/L (ref 22–32)
Calcium: 8.7 mg/dL — ABNORMAL LOW (ref 8.9–10.3)
Chloride: 100 mmol/L (ref 98–111)
Creatinine, Ser: 0.99 mg/dL (ref 0.61–1.24)
GFR, Estimated: >60 mL/min (ref >60)
Glucose, Bld: 214 mg/dL — ABNORMAL HIGH (ref 70–99)
Potassium: 4.4 mmol/L (ref 3.5–5.1)
Sodium: 132 mmol/L — ABNORMAL LOW (ref 135–145)

## 2022-01-07 LAB — GLUCOSE, CAPILLARY
Glucose-Capillary: 163 mg/dL — ABNORMAL HIGH (ref 70–99)
Glucose-Capillary: 178 mg/dL — ABNORMAL HIGH (ref 70–99)
Glucose-Capillary: 160 mg/dL — ABNORMAL HIGH (ref 70–99)
Glucose-Capillary: 156 mg/dL — ABNORMAL HIGH (ref 70–99)
Glucose-Capillary: 169 mg/dL — ABNORMAL HIGH (ref 70–99)

## 2022-01-07 LAB — HEMOCHROMATOSIS DNA-PCR(C282Y,H63D)

## 2022-01-07 LAB — MAGNESIUM: Magnesium: 1.9 mg/dL (ref 1.7–2.4)

## 2022-01-07 MED ORDER — METOPROLOL TARTRATE 25 MG PO TABS
25.0000 mg | ORAL_TABLET | Freq: Two times a day (BID) | ORAL | Status: DC
  Administered 2022-01-07 – 2022-01-09 (×5): 25 mg via ORAL
  Filled 2022-01-07 (×5): qty 1

## 2022-01-07 MED ORDER — AMLODIPINE BESYLATE 10 MG PO TABS
10.0000 mg | ORAL_TABLET | Freq: Every day | ORAL | Status: DC
  Administered 2022-01-07 – 2022-01-09 (×3): 10 mg via ORAL
  Filled 2022-01-07 (×3): qty 1

## 2022-01-07 NOTE — Anesthesia Postprocedure Evaluation (Signed)
Anesthesia Post Note  Patient: Randy Frazier  Procedure(s) Performed: XI ROBOTIC ASSISTED LAPAROSCOPIC CHOLECYSTECTOMY (Abdomen) INDOCYANINE GREEN FLUORESCENCE IMAGING (ICG) (Abdomen) LIVER BIOPSY  Patient location during evaluation: PACU Anesthesia Type: General Level of consciousness: awake and alert Pain management: pain level controlled Vital Signs Assessment: post-procedure vital signs reviewed and stable Respiratory status: spontaneous breathing, nonlabored ventilation, respiratory function stable and patient connected to nasal cannula oxygen Cardiovascular status: blood pressure returned to baseline and stable Postop Assessment: no apparent nausea or vomiting Anesthetic complications: no   No notable events documented.   Last Vitals:  Vitals:   01/07/22 0616 01/07/22 0634  BP:  (!) 178/73  Pulse:  64  Resp: 20 20  Temp:    SpO2:      Last Pain:  Vitals:   01/07/22 0402  TempSrc:   PainSc: 10-Worst pain ever                 Arita Miss

## 2022-01-07 NOTE — Progress Notes (Signed)
Patient reports nausea, refuses medication.

## 2022-01-07 NOTE — Progress Notes (Signed)
Patient called primary RN, he has changed his mind and is now agreeable to morning labs. Phlebotomist called.

## 2022-01-07 NOTE — Progress Notes (Signed)
PROGRESS NOTE    Randy Frazier  CHY:[id number] DOB: 07/22/1927 DOA: 01/01/2022 PCP: Pcp, No    Brief Narrative: This 88 years old male with PMH significant for hypertension, insulin-dependent diabetes mellitus, CAD s/p CABG, history of CVA now presented in the ED with complaints of loss of appetite, shaking chills, upper abdominal pain since yesterday.  Patient has been complaining about some pain in the epigastric associated decreased p.o. intake denies any nausea vomiting.  He was febrile on arrival was given fluid resuscitation.  He was hypoxic requiring 2 L of supplemental oxygen.  CTA ruled out pulmonary embolism.  Blood cultures were collected and patient was admitted for sepsis secondary to unknown cause.  Assessment & Plan:   Principal Problem:   SIRS (systemic inflammatory response syndrome) (HCC) Active Problems:   Epigastric pain   Cirrhosis (HCC)   CAD, AUTOLOGOUS BYPASS GRAFT   DM type 2 with diabetic peripheral neuropathy (HCC)   History of CVA (cerebrovascular accident)   Liver mass   Acute respiratory failure with hypoxia (HCC)   AKI (acute kidney injury) ([place])   Chronic cholecystitis with calculus  Suspected sepsis: E. Coli / Klebsiella bacteremia: Acute Cholecystitis: Patient presented with chills, rigors, loss of appetite, upper abdominal pain and found to have fever with leukocytosis and elevated lactic acid. COVID and influenza PCR were negative and no evidence for infection seen on chest CT  Started on empiric antibiotics and was given IV fluid resuscitation. Lactic acid 4.2, improved with fluid resuscitation Procalcitonin 7.18 trended up to 22.03, WBC 19.2, UA : unremarkable. Continue current antibiotics Cefepime [place] RUQ: Gallstones and gallbladder sludge. Mild gallbladder wall Thickening.   Blood cultures are growing E. coli, enterococci and Klebsiella. Patient continues to have leukocytosis despite being on antibiotics. General surgery consulted for  gallstones,  recommended HIDA scan. HIDA scan result consistent with biliary dyskinesia. Patient underwent robotic assisted cholecystectomy POD #1.  Tolerated well. Continue IV cefepime. Need to discuss duration of antibiotics with ID.  Acute kidney injury: > Resolved. SCr is 1.60 on admission; baseline appears to be closer to 1.1  Likely prerenal in setting of suspected sepsis and anorexia with hypovolemia  Continue IVF hydration, hold ACE-i, renally-dose medications,  Serum creatinine improving 1.61> 1.41 >1.37>1.22 >1.14>1.28>0.99   Acute hypoxic respiratory failure: > Resolved. He was hypoxic requiring supplemental oxygen. He denies cough and there is no PE or acute pulmonary findings on CTA chest in ED  He is weaned down to room air.  Acute hypoxia resolved.   Cirrhosis /Liver masses Recent diagnosis, concerning for hepatocellular carcinoma, had initial GI consult this month and saw oncology for the first time on 12/31/21  Continue GI and oncology follow-up on discharge as planned.   Diabetes Mellitus HbA1c was 7.3% in October 2023  Continue regular insulin sliding scale   Essential hypertension Continue home medications.   Coronary artery disease- Continue statin and antiplatelets, hold beta-blocker initially given concern for evolving sepsis     History of CVA: Continue statin and antiplatelets   DVT prophylaxis: Heparin sq Code Status:Full code Family Communication:  No family at bed side. Disposition Plan:   Status is: Inpatient Remains inpatient appropriate because: Admitted for suspected sepsis secondary to unknown cause.  Continue empiric antibiotics follow-up cultures. He underwent laparoscopic cholecystectomy. POD # 1   Anticipated discharge home 01/08/2022.  Consultants:  General surgery  Procedures: CTA chest, HIDA scan, scheduled laparoscopic cholecystectomy today Antimicrobials:  Anti-infectives (From admission, onward)    Start  Dose/Rate Route  Frequency Ordered Stop   01/03/22 2100  vancomycin (VANCOREADY) IVPB 1750 mg/350 mL  Status:  Discontinued        1,750 mg 175 mL/hr over 120 Minutes Intravenous Every 48 hours 01/02/22 0049 01/02/22 0053   01/03/22 2100  vancomycin (VANCOREADY) IVPB 1750 mg/350 mL  Status:  Discontinued        1,750 mg 175 mL/hr over 120 Minutes Intravenous Every 48 hours 01/02/22 0053 01/02/22 0449   01/02/22 0900  ceFEPIme (MAXIPIME) 2 g in sodium chloride 0.9 % 100 mL IVPB  Status:  Discontinued        2 g 200 mL/hr over 30 Minutes Intravenous Every 12 hours 01/02/22 0047 01/02/22 0053   01/02/22 0900  ceFEPIme (MAXIPIME) 2 g in sodium chloride 0.9 % 100 mL IVPB        2 g 200 mL/hr over 30 Minutes Intravenous Every 12 hours 01/02/22 0053 01/09/22 0859   01/02/22 0845  metroNIDAZOLE (FLAGYL) IVPB 500 mg  Status:  Discontinued        500 mg 100 mL/hr over 60 Minutes Intravenous Every 12 hours 01/01/22 2320 01/02/22 0448   01/01/22 2045  ceFEPIme (MAXIPIME) 2 g in sodium chloride 0.9 % 100 mL IVPB        2 g 200 mL/hr over 30 Minutes Intravenous  Once 01/01/22 2033 01/01/22 2150   01/01/22 2045  metroNIDAZOLE (FLAGYL) IVPB 500 mg        500 mg 100 mL/hr over 60 Minutes Intravenous  Once 01/01/22 2033 01/01/22 2200   01/01/22 2045  vancomycin (VANCOCIN) IVPB 1000 mg/200 mL premix  Status:  Discontinued        1,000 mg 200 mL/hr over 60 Minutes Intravenous  Once 01/01/22 2033 01/01/22 2037   01/01/22 2045  vancomycin (VANCOREADY) IVPB 1500 mg/300 mL        1,500 mg 150 mL/hr over 120 Minutes Intravenous  Once 01/01/22 2037 01/01/22 2301      Subjective: Patient was seen and examined at bedside. Overnight events noted.  Patient reports doing better.  Blood pressure has improved.   He is s/p laparoscopic cholecystectomy post operative day # 1. He reports having soreness around the abdominal scars.  Objective: Vitals:   01/07/22 0752 01/07/22 0752 01/07/22 0815 01/07/22 1236  BP: (!) 169/79 (!)  169/79 (!) 172/76 (!) 161/69  Pulse: 66 68 73 73  Resp: '16 16  18  '$ Temp: 98.7 F (37.1 C) 98.7 F (37.1 C)  97.9 F (36.6 C)  TempSrc:    Oral  SpO2: 98% 96% 96% 98%  Weight:      Height:        Intake/Output Summary (Last 24 hours) at 01/07/2022 1316 Last data filed at 01/07/2022 1610 Gross per 24 hour  Intake 2261.85 ml  Output 990 ml  Net 1271.85 ml   Filed Weights   01/05/22 0500 01/06/22 0537 01/07/22 0409  Weight: 78.1 kg 79.1 kg 79.1 kg    Examination:  General exam: Appears comfortable, not in any acute distress, deconditioned. Respiratory system: CTA bilaterally, no wheezing, no crackles, normal respiratory effort. Cardiovascular system: S1-S2 heard, regular rate and rhythm, no murmur. Gastrointestinal system: Abdomen is soft, mildly sore, non distended, BS+,  surgical scars noted from lap chole Central nervous system: Alert and oriented x 3, no focal neurological deficits. Extremities:  No edema, no cyanosis, no clubbing Skin: No rashes, lesions or ulcers Psychiatry: Judgement and insight appear normal. Mood & affect appropriate.  Data Reviewed: I have personally reviewed following labs and imaging studies  CBC: Recent Labs  Lab 01/01/22 2007 01/02/22 0519 01/03/22 0406 01/04/22 0413 01/05/22 0414 01/06/22 0410 01/07/22 0605  WBC 15.9*   < > 23.0* 15.8* 13.8* 13.8* 16.3*  NEUTROABS 10.2*  --   --   --   --   --   --   HGB 11.5*   < > 9.9* 10.4* 11.0* 10.6* 11.1*  HCT 34.5*   < > 29.7* 31.1* 32.4* 32.0* 33.5*  MCV 83.7   < > 83.7 82.3 81.8 82.9 82.9  PLT 179   < > 109* 108* 102* 104* 113*   < > = values in this interval not displayed.   Basic Metabolic Panel: Recent Labs  Lab 01/03/22 0406 01/04/22 0413 01/05/22 0414 01/06/22 0410 01/07/22 0605  NA 135 130* 136 135 132*  K 4.2 4.0 4.3 4.6 4.4  CL 104 100 104 100 100  CO2 '23 22 25 25 24  '$ GLUCOSE 138* 177* 178* 188* 214*  BUN 33* 31* 26* 30* 25*  CREATININE 1.37* 1.22 1.14 1.28* 0.99   CALCIUM 8.3* 8.7* 9.3 9.5 8.7*  MG  --   --  1.8 1.8 1.9  PHOS  --   --  3.4 3.5 4.4   GFR: Estimated Creatinine Clearance: 53.3 mL/min (by C-G formula based on SCr of 0.99 mg/dL). Liver Function Tests: Recent Labs  Lab 01/01/22 2007 01/02/22 0519 01/03/22 0406 01/04/22 0413  AST 47* 44* 72* 61*  ALT '25 21 26 30  '$ ALKPHOS 121 109 87 126  BILITOT 1.7* 1.2 0.8 0.8  PROT 7.4 6.8 6.6 7.2  ALBUMIN 3.1* 2.9* 2.8* 2.9*   No results for input(s): "LIPASE", "AMYLASE" in the last 168 hours. No results for input(s): "AMMONIA" in the last 168 hours. Coagulation Profile: Recent Labs  Lab 12/31/21 1409  INR 1.1   Cardiac Enzymes: No results for input(s): "CKTOTAL", "CKMB", "CKMBINDEX", "TROPONINI" in the last 168 hours. BNP (last 3 results) No results for input(s): "PROBNP" in the last 8760 hours. HbA1C: No results for input(s): "HGBA1C" in the last 72 hours. CBG: Recent Labs  Lab 01/06/22 1202 01/06/22 1627 01/06/22 2149 01/07/22 0753 01/07/22 1240  GLUCAP 134* 162* 232* 163* 178*   Lipid Profile: No results for input(s): "CHOL", "HDL", "LDLCALC", "TRIG", "CHOLHDL", "LDLDIRECT" in the last 72 hours. Thyroid Function Tests: No results for input(s): "TSH", "T4TOTAL", "FREET4", "T3FREE", "THYROIDAB" in the last 72 hours. Anemia Panel: No results for input(s): "VITAMINB12", "FOLATE", "FERRITIN", "TIBC", "IRON", "RETICCTPCT" in the last 72 hours. Sepsis Labs: Recent Labs  Lab 01/01/22 2007 01/02/22 0058 01/02/22 0519 01/02/22 1710 01/03/22 0406 01/05/22 0414  PROCALCITON  --  7.18 22.03  --  21.59 5.49  LATICACIDVEN 4.5* 4.2*  --  1.4  --   --     Recent Results (from the past 240 hour(s))  Blood Culture (routine x 2)     Status: Abnormal   Collection Time: 01/01/22  8:07 PM   Specimen: BLOOD RIGHT ARM  Result Value Ref Range Status   Specimen Description   Final    BLOOD RIGHT ARM Performed at [facility], [address] [place], [place] [address]     Special Requests   Final    BOTTLES DRAWN AEROBIC AND ANAEROBIC Blood Culture results may not be optimal due to an excessive volume of blood received in culture bottles Performed at [facility], [address], [place], [place] [address]  Culture  Setup Time   Final    GRAM NEGATIVE RODS IN BOTH AEROBIC AND ANAEROBIC BOTTLES CRITICAL RESULT CALLED TO, READ BACK BY AND VERIFIED WITH: JASON ROBBINS 01/02/2022 AT 0406 SRR Performed at [facility], [address] [place], [place] [address]    Culture (A)  Final    ESCHERICHIA COLI ENTEROBACTER AEROGENES SUSCEPTIBILITIES PERFORMED ON PREVIOUS CULTURE WITHIN THE LAST 5 DAYS. Performed at [facility], [place] [address] [place], [place] [address]    Report Status 01/05/2022 FINAL  Final  Blood Culture (routine x 2)     Status: Abnormal   Collection Time: 01/01/22  8:12 PM   Specimen: BLOOD LEFT ARM  Result Value Ref Range Status   Specimen Description   Final    BLOOD LEFT ARM Performed at [facility], [address], [place], [place] [address]    Special Requests   Final    Blood Culture results may not be optimal due to an inadequate volume of blood received in culture bottles Performed at [facility], [address] [place], [place] [address]    Culture  Setup Time   Final    GRAM NEGATIVE RODS IN BOTH AEROBIC AND ANAEROBIC BOTTLES Organism ID to follow CRITICAL RESULT CALLED TO, READ BACK BY AND VERIFIED WITH: JASON ROBBIN 01/02/2022 AT 0406 SRR Performed at [facility], [address] [place], [place] [address]    Culture ESCHERICHIA COLI ENTEROBACTER AEROGENES  (A)  Final   Report Status 01/05/2022 FINAL  Final   Organism ID, Bacteria ESCHERICHIA COLI  Final   Organism ID, Bacteria ENTEROBACTER AEROGENES  Final      Susceptibility   Enterobacter aerogenes - MIC*    CEFAZOLIN >=64 RESISTANT Resistant     CEFEPIME <=0.12 SENSITIVE Sensitive     CEFTAZIDIME  <=1 SENSITIVE Sensitive     CEFTRIAXONE <=0.25 SENSITIVE Sensitive     CIPROFLOXACIN <=0.25 SENSITIVE Sensitive     GENTAMICIN <=1 SENSITIVE Sensitive     IMIPENEM 2 SENSITIVE Sensitive     TRIMETH/SULFA <=20 SENSITIVE Sensitive     PIP/TAZO <=4 SENSITIVE Sensitive     * ENTEROBACTER AEROGENES   Escherichia coli - MIC*    AMPICILLIN <=2 SENSITIVE Sensitive     CEFAZOLIN <=4 SENSITIVE Sensitive     CEFEPIME <=0.12 SENSITIVE Sensitive     CEFTAZIDIME <=1 SENSITIVE Sensitive     CEFTRIAXONE <=0.25 SENSITIVE Sensitive     CIPROFLOXACIN <=0.25 SENSITIVE Sensitive     GENTAMICIN <=1 SENSITIVE Sensitive     IMIPENEM <=0.25 SENSITIVE Sensitive     TRIMETH/SULFA <=20 SENSITIVE Sensitive     AMPICILLIN/SULBACTAM <=2 SENSITIVE Sensitive     PIP/TAZO <=4 SENSITIVE Sensitive     * ESCHERICHIA COLI  Blood Culture ID Panel (Reflexed)     Status: Abnormal   Collection Time: 01/01/22  8:12 PM  Result Value Ref Range Status   Enterococcus faecalis NOT DETECTED NOT DETECTED Final   Enterococcus Faecium NOT DETECTED NOT DETECTED Final   Listeria monocytogenes NOT DETECTED NOT DETECTED Final   Staphylococcus species NOT DETECTED NOT DETECTED Final   Staphylococcus aureus (BCID) NOT DETECTED NOT DETECTED Final   Staphylococcus epidermidis NOT DETECTED NOT DETECTED Final   Staphylococcus lugdunensis NOT DETECTED NOT DETECTED Final   Streptococcus species NOT DETECTED NOT DETECTED Final   Streptococcus agalactiae NOT DETECTED NOT DETECTED Final   Streptococcus pneumoniae NOT DETECTED NOT DETECTED Final   Streptococcus pyogenes NOT DETECTED NOT DETECTED Final  A.calcoaceticus-baumannii NOT DETECTED NOT DETECTED Final   Bacteroides fragilis NOT DETECTED NOT DETECTED Final   Enterobacterales DETECTED (A) NOT DETECTED Final    Comment: CRITICAL RESULT CALLED TO, READ BACK BY AND VERIFIED WITH: JASON ROBBINS 01/02/2022 AT 0406 SRR    Enterobacter cloacae complex NOT DETECTED NOT DETECTED Final    Escherichia coli DETECTED (A) NOT DETECTED Final    Comment: CRITICAL RESULT CALLED TO, READ BACK BY AND VERIFIED WITH: JASON ROBBINS 01/02/2022 AT 0406 SRR    Klebsiella aerogenes DETECTED (A) NOT DETECTED Final    Comment: CRITICAL RESULT CALLED TO, READ BACK BY AND VERIFIED WITH: JASON ROBBINS 01/02/2022 AT 0406 SRR    Klebsiella oxytoca NOT DETECTED NOT DETECTED Final   Klebsiella pneumoniae NOT DETECTED NOT DETECTED Final   Proteus species NOT DETECTED NOT DETECTED Final   Salmonella species NOT DETECTED NOT DETECTED Final   Serratia marcescens NOT DETECTED NOT DETECTED Final   Haemophilus influenzae NOT DETECTED NOT DETECTED Final   Neisseria meningitidis NOT DETECTED NOT DETECTED Final   Pseudomonas aeruginosa NOT DETECTED NOT DETECTED Final   Stenotrophomonas maltophilia NOT DETECTED NOT DETECTED Final   Candida albicans NOT DETECTED NOT DETECTED Final   Candida auris NOT DETECTED NOT DETECTED Final   Candida glabrata NOT DETECTED NOT DETECTED Final   Candida krusei NOT DETECTED NOT DETECTED Final   Candida parapsilosis NOT DETECTED NOT DETECTED Final   Candida tropicalis NOT DETECTED NOT DETECTED Final   Cryptococcus neoformans/gattii NOT DETECTED NOT DETECTED Final   CTX-M ESBL NOT DETECTED NOT DETECTED Final   Carbapenem resistance IMP NOT DETECTED NOT DETECTED Final   Carbapenem resistance KPC NOT DETECTED NOT DETECTED Final   Carbapenem resistance NDM NOT DETECTED NOT DETECTED Final   Carbapenem resist OXA 48 LIKE NOT DETECTED NOT DETECTED Final   Carbapenem resistance VIM NOT DETECTED NOT DETECTED Final    Comment: Performed at [facility], [address] [place], [place] [address]  Resp Panel by RT-PCR (Flu A&B, Covid) Anterior Nasal Swab     Status: None   Collection Time: 01/01/22  8:15 PM   Specimen: Anterior Nasal Swab  Result Value Ref Range Status   SARS Coronavirus 2 by RT PCR NEGATIVE NEGATIVE Final    Comment: (NOTE) SARS-CoV-2 target nucleic  acids are NOT DETECTED.  The SARS-CoV-2 RNA is generally detectable in upper respiratory specimens during the acute phase of infection. The lowest concentration of SARS-CoV-2 viral copies this assay can detect is 138 copies/mL. A negative result does not preclude SARS-Cov-2 infection and should not be used as the sole basis for treatment or other patient management decisions. A negative result may occur with  improper specimen collection/handling, submission of specimen other than nasopharyngeal swab, presence of viral mutation(s) within the areas targeted by this assay, and inadequate number of viral copies(<138 copies/mL). A negative result must be combined with clinical observations, patient history, and epidemiological information. The expected result is Negative.  Fact Sheet for Patients:  EntrepreneurPulse.com.au  Fact Sheet for Healthcare Providers:  IncredibleEmployment.be  This test is no t yet approved or cleared by the [place] FDA and  has been authorized for detection and/or diagnosis of SARS-CoV-2 by FDA under an Emergency Use Authorization (EUA). This EUA will remain  in effect (meaning this test can be used) for the duration of the COVID-19 declaration under Section 564(b)(1) of the Act, 21 U.S.C.section 360bbb-3(b)(1), unless the authorization is terminated  or revoked sooner.       Influenza  A by PCR NEGATIVE NEGATIVE Final   Influenza B by PCR NEGATIVE NEGATIVE Final    Comment: (NOTE) The Xpert Xpress SARS-CoV-2/FLU/RSV plus assay is intended as an aid in the diagnosis of influenza from Nasopharyngeal swab specimens and should not be used as a sole basis for treatment. Nasal washings and aspirates are unacceptable for Xpert Xpress SARS-CoV-2/FLU/RSV testing.  Fact Sheet for Patients: EntrepreneurPulse.com.au  Fact Sheet for Healthcare Providers: IncredibleEmployment.be  This  test is not yet approved or cleared by the [place] FDA and has been authorized for detection and/or diagnosis of SARS-CoV-2 by FDA under an Emergency Use Authorization (EUA). This EUA will remain in effect (meaning this test can be used) for the duration of the COVID-19 declaration under Section 564(b)(1) of the Act, 21 U.S.C. section 360bbb-3(b)(1), unless the authorization is terminated or revoked.  Performed at [facility], [address], [place], [place] [address]   Surgical pcr screen     Status: Abnormal   Collection Time: 01/06/22 12:00 PM   Specimen: Nasal Mucosa; Nasal Swab  Result Value Ref Range Status   MRSA, PCR NEGATIVE NEGATIVE Final   Staphylococcus aureus POSITIVE (A) NEGATIVE Final    Comment: (NOTE) The Xpert SA Assay (FDA approved for NASAL specimens in patients 73 years of age and older), is one component of a comprehensive surveillance program. It is not intended to diagnose infection nor to guide or monitor treatment. Performed at [facility], [address], [place], [place] [address]     Radiology Studies: No results found.  Scheduled Meds:  amLODipine  10 mg Oral Daily   aspirin EC  81 mg Oral Daily   atorvastatin  10 mg Oral Daily   Chlorhexidine Gluconate Cloth  6 each Topical Q0600   hydrALAZINE  25 mg Oral Q8H   insulin aspart  0-5 Units Subcutaneous QHS   insulin aspart  0-6 Units Subcutaneous TID WC   metoprolol tartrate  25 mg Oral BID   mupirocin ointment  1 Application Nasal BID   pantoprazole  40 mg Oral Daily   sodium chloride flush  3 mL Intravenous Q12H   Continuous Infusions:  sodium chloride     sodium chloride 50 mL/hr at 01/07/22 0652   ceFEPime (MAXIPIME) IV 2 g (01/07/22 0806)     LOS: 6 days    Time spent: 35 mins    Safiyah Cisney, MD [facility]   If 7PM-7AM, please contact night-coverage

## 2022-01-07 NOTE — Evaluation (Signed)
Physical Therapy Evaluation Patient Details Name: Randy Frazier MRN: [id number] DOB: 1927-08-28 Today's Date: 01/07/2022  History of Present Illness  88 y/o male presented to ED on 01/01/22 for upper abdominal pain and difficulty swallowing. Admitted for concern of sepsis of unknown origin. S/p laparoscopic cholecystectomy on 11/27. PMH: HTN, T2DM, CAD s/p CABG, hx of CVA.  Clinical Impression  Patient admitted with the above and s/p above procedure. PTA, patient lives alone and was very independent, driving, and gardening, etc. Son lives next door and can provide intermittent assistance. Patient presents with weakness, impaired balance, pain, and decreased activity tolerance. Instructed on log roll technique for bed mobility to reduce stress on abdominal incision but required minA to complete. Able to stand from EOB with HHAx1 and minA. Able to take pivotal steps towards recliner with min guard and HHAx1. Declined further mobility due to abdominal soreness. Encouraged OOB mobility during the day to improve activity tolerance. Patient will benefit from skilled PT services during acute stay to address listed deficits. No PT follow up recommended at this time, anticipate patient will progress well once pain is managed.        Recommendations for follow up therapy are one component of a multi-disciplinary discharge planning process, led by the attending physician.  Recommendations may be updated based on patient status, additional functional criteria and insurance authorization.  Follow Up Recommendations No PT follow up      Assistance Recommended at Discharge Intermittent Supervision/Assistance  Patient can return home with the following  A little help with walking and/or transfers;A little help with bathing/dressing/bathroom;Assistance with cooking/housework;Assist for transportation;Help with stairs or ramp for entrance    Equipment Recommendations Rolling Maudene Stotler (2 wheels)   Recommendations for Other Services       Functional Status Assessment Patient has had a recent decline in their functional status and demonstrates the ability to make significant improvements in function in a reasonable and predictable amount of time.     Precautions / Restrictions Precautions Precautions: Fall Precaution Comments: abdominal incision, JP drain on L flank Restrictions Weight Bearing Restrictions: No      Mobility  Bed Mobility Overal bed mobility: Needs Assistance Bed Mobility: Rolling, Sidelying to Sit Rolling: Min assist Sidelying to sit: Min assist       General bed mobility comments: cues and minA for log roll technique due to abdominal soreness from incision    Transfers Overall transfer level: Needs assistance Equipment used: 1 person hand held assist Transfers: Sit to/from Stand, Bed to chair/wheelchair/BSC Sit to Stand: Min assist   Step pivot transfers: Min guard       General transfer comment: assist to boost into standing. Patient bracing self with pillow during stand. Min guard to complete steps towards recliner with HHAx1. D    Ambulation/Gait               General Gait Details: declined further mobility due to abdominal soreness/pain  Stairs            Wheelchair Mobility    Modified Rankin (Stroke Patients Only)       Balance Overall balance assessment: Mild deficits observed, not formally tested                                           Pertinent Vitals/Pain Pain Assessment Pain Assessment: Faces Faces Pain Scale: Hurts even more Pain Location: abdomen  Pain Descriptors / Indicators: Grimacing, Guarding, Discomfort, Sore Pain Intervention(s): Monitored during session, Repositioned, Limited activity within patient's tolerance    Home Living Family/patient expects to be discharged to:: Private residence Living Arrangements: Alone Available Help at Discharge: Family;Available  PRN/intermittently Type of Home: House Home Access: Stairs to enter Entrance Stairs-Rails: Psychiatric nurse of Steps: 3-4   Home Layout: One level Home Equipment: None      Prior Function Prior Level of Function : Independent/Modified Independent;Driving                     Hand Dominance        Extremity/Trunk Assessment   Upper Extremity Assessment Upper Extremity Assessment: Overall WFL for tasks assessed    Lower Extremity Assessment Lower Extremity Assessment: Generalized weakness    Cervical / Trunk Assessment Cervical / Trunk Assessment: Other exceptions Cervical / Trunk Exceptions: abdominal incision  Communication   Communication: No difficulties  Cognition Arousal/Alertness: Awake/alert Behavior During Therapy: WFL for tasks assessed/performed Overall Cognitive Status: Within Functional Limits for tasks assessed                                          General Comments      Exercises     Assessment/Plan    PT Assessment Patient needs continued PT services  PT Problem List Decreased strength;Decreased activity tolerance;Decreased balance;Decreased mobility;Decreased safety awareness;Pain       PT Treatment Interventions DME instruction;Gait training;Functional mobility training;Therapeutic activities;Stair training;Balance training;Therapeutic exercise;Patient/family education    PT Goals (Current goals can be found in the Care Plan section)  Acute Rehab PT Goals Patient Stated Goal: to reduce pain PT Goal Formulation: With patient/family Time For Goal Achievement: 01/21/22 Potential to Achieve Goals: Good    Frequency Min 2X/week     Co-evaluation               AM-PAC PT "6 Clicks" Mobility  Outcome Measure Help needed turning from your back to your side while in a flat bed without using bedrails?: A Little Help needed moving from lying on your back to sitting on the side of a flat bed  without using bedrails?: A Little Help needed moving to and from a bed to a chair (including a wheelchair)?: A Little Help needed standing up from a chair using your arms (e.g., wheelchair or bedside chair)?: A Little Help needed to walk in hospital room?: A Little Help needed climbing 3-5 steps with a railing? : A Little 6 Click Score: 18    End of Session   Activity Tolerance: Patient tolerated treatment well;Patient limited by pain Patient left: in chair;with call bell/phone within reach;with family/visitor present Nurse Communication: Mobility status PT Visit Diagnosis: Muscle weakness (generalized) (M62.81);Difficulty in walking, not elsewhere classified (R26.2)    Time: 5643-3295 PT Time Calculation (min) (ACUTE ONLY): 25 min   Charges:   PT Evaluation $PT Eval Low Complexity: 1 Low PT Treatments $Therapeutic Activity: 8-22 mins        Dorathy Stallone A. Gilford Rile PT, DPT [facility] - Acute Rehabilitation Services   Jesica Goheen A Amariona Rathje 01/07/2022, 12:59 PM

## 2022-01-07 NOTE — Progress Notes (Addendum)
[facility] Hospital Day(s): 6.   Post op day(s): 1 Day Post-Op.   Interval History:  Patient seen and examined Issues with agitation overnight; improved this morning Patient reports soreness at camera port site No nausea, emesis Leukocytosis to 16.3K; likely bump secondary to surgery Hgb to 11.1; improved compared to yesterday  Renal function normal sCr - 0.99; UO - 900 ccs + unmeasured No significant electrolyte derangements Surgical drain output 190 ccs; sanguinous but thin He is on CLD; tolerating + Bowel function   Vital signs in last 24 hours: [min-max] current  Temp:  [97 F (36.1 C)-98.6 F (37 C)] 97.7 F (36.5 C) (11/28 0349) Pulse Rate:  [64-79] 64 (11/28 0634) Resp:  [12-21] 20 (11/28 0634) BP: (152-183)/(70-90) 178/73 (11/28 0634) SpO2:  [94 %-100 %] 96 % (11/28 0349) Weight:  [79.1 kg] 79.1 kg (11/28 0409)     Height: 6' (182.9 cm) Weight: 79.1 kg BMI (Calculated): 23.65   Intake/Output last 2 shifts:  11/27 0701 - 11/28 0700 In: 2261.9 [P.O.:120; I.V.:1841.9; IV Piggyback:300] Out: 1190 [Urine:900; Drains:190; Blood:100]   Physical Exam:  Constitutional: alert, cooperative and no distress; agitation noted overnight seems improved Respiratory: breathing non-labored at rest  Cardiovascular: regular rate and sinus rhythm  Gastrointestinal: Soft, incisional soreness expectedly, worse at camera port, non-distended, no rebound/guarding. Surgical drain in left lateral port site; output is sanguinous but thin  Integumentary: Laparoscopic incisions are CDI with dermabond, no erythema or drainage, there is early ecchymosis   Labs:     Latest Ref Rng & Units 01/07/2022    6:05 AM 01/06/2022    4:10 AM 01/05/2022    4:14 AM  CBC  WBC 4.0 - 10.5 K/uL 16.3  13.8  13.8   Hemoglobin 13.0 - 17.0 g/dL 11.1  10.6  11.0   Hematocrit 39.0 - 52.0 % 33.5  32.0  32.4   Platelets 150 - 400 K/uL 113  104  102       Latest Ref Rng &  Units 01/06/2022    4:10 AM 01/05/2022    4:14 AM 01/04/2022    4:13 AM  CMP  Glucose 70 - 99 mg/dL 188  178  177   BUN 8 - 23 mg/dL '30  26  31   '$ Creatinine 0.61 - 1.24 mg/dL 1.28  1.14  1.22   Sodium 135 - 145 mmol/L 135  136  130   Potassium 3.5 - 5.1 mmol/L 4.6  4.3  4.0   Chloride 98 - 111 mmol/L 100  104  100   CO2 22 - 32 mmol/L '25  25  22   '$ Calcium 8.9 - 10.3 mg/dL 9.5  9.3  8.7   Total Protein 6.5 - 8.1 g/dL   7.2   Total Bilirubin 0.3 - 1.2 mg/dL   0.8   Alkaline Phos 38 - 126 U/L   126   AST 15 - 41 U/L   61   ALT 0 - 44 U/L   30      Imaging studies: No new pertinent imaging studies   Assessment/Plan:  88 y.o. male 1 Day Post-Op s/p robotic assisted laparoscopic cholecystectomy and liver biopsy   - I think it is reasonable to advance diet as tolerated - Continue surgical drain; monitor output; this is slightly sanguinous but Hgb improved   - Continue IV Abx (Cefepime)  - Monitor leukocytosis; suspect bump is reaction to surgery              -  Monitor abdominal examination - Pain control prn; antiemetics prn - Mobilize as feasible; would engage therapies  - Follow up pathology / liver biopsy - Further management per primary service; we will follow     - Discharge Planning: Pending advancement of diet, work with therapies. No rush for DC given age/surgery. Overall doing well    All of the above findings and recommendations were discussed with the patient, and the medical team, and all of patient's questions were answered to his expressed satisfaction.  -- Edison Simon, PA-C [facility] 01/07/2022, 7:33 AM M-F: 7am - 4pm

## 2022-01-07 NOTE — Care Management Important Message (Signed)
Important Message  Patient Details  Name: Randy Frazier MRN: [id number] Date of Birth: Oct 24, 1927   Medicare Important Message Given:  Yes     Dannette Barbara 01/07/2022, 11:44 AM

## 2022-01-07 NOTE — Progress Notes (Signed)
Patient refused morning labs. Primary RN approached patient to provide education regarding the need for morning labs. Patient responded, "No, fuck you".    Will report off at shift change.

## 2022-01-08 DIAGNOSIS — R6510 Systemic inflammatory response syndrome (SIRS) of non-infectious origin without acute organ dysfunction: Secondary | ICD-10-CM | POA: Diagnosis not present

## 2022-01-08 LAB — CBC
HCT: 32.5 % — ABNORMAL LOW (ref 39.0–52.0)
Hemoglobin: 10.5 g/dL — ABNORMAL LOW (ref 13.0–17.0)
MCH: 26.9 pg (ref 26.0–34.0)
MCHC: 32.3 g/dL (ref 30.0–36.0)
MCV: 83.1 fL (ref 80.0–100.0)
Platelets: 123 10*3/uL — ABNORMAL LOW (ref 150–400)
RBC: 3.91 MIL/uL — ABNORMAL LOW (ref 4.22–5.81)
RDW: 18.4 % — ABNORMAL HIGH (ref 11.5–15.5)
WBC: 16.8 10*3/uL — ABNORMAL HIGH (ref 4.0–10.5)
nRBC: 0 % (ref 0.0–0.2)

## 2022-01-08 LAB — GLUCOSE, CAPILLARY
Glucose-Capillary: 144 mg/dL — ABNORMAL HIGH (ref 70–99)
Glucose-Capillary: 242 mg/dL — ABNORMAL HIGH (ref 70–99)
Glucose-Capillary: 174 mg/dL — ABNORMAL HIGH (ref 70–99)
Glucose-Capillary: 246 mg/dL — ABNORMAL HIGH (ref 70–99)
Glucose-Capillary: 169 mg/dL — ABNORMAL HIGH (ref 70–99)

## 2022-01-08 NOTE — Progress Notes (Signed)
[facility] Hospital Day(s): 7.   Post op day(s): 2 Days Post-Op.   Interval History:  Patient seen and examined Nothing acute overnight Patient reports he is doing well; still sore No nausea, emesis Leukocytosis stable to 16.8K Hgb to 10.5 Surgical drain output 150 ccs; sanguinous but thin He is on CLD; tolerating + Bowel function  Worked with therapies; no recommendations   Vital signs in last 24 hours: [min-max] current  Temp:  [97.5 F (36.4 C)-98.7 F (37.1 C)] 98.1 F (36.7 C) (11/29 0353) Pulse Rate:  [64-73] 72 (11/29 0353) Resp:  [14-18] 17 (11/29 0353) BP: (138-172)/(67-79) 150/69 (11/29 0353) SpO2:  [96 %-98 %] 97 % (11/29 0353) Weight:  [76.3 kg] 76.3 kg (11/29 0500)     Height: 6' (182.9 cm) Weight: 76.3 kg BMI (Calculated): 22.81   Intake/Output last 2 shifts:  11/28 0701 - 11/29 0700 In: 494.2 [I.V.:394.2; IV Piggyback:100] Out: 6314 [Urine:1500; Drains:150]   Physical Exam:  Constitutional: alert, cooperative and no distress; agitation noted overnight seems improved Respiratory: breathing non-labored at rest  Cardiovascular: regular rate and sinus rhythm  Gastrointestinal: Soft, incisional soreness expectedly, worse at camera port, non-distended, no rebound/guarding. Surgical drain in left lateral port site; output is sanguinous but thin Integumentary: Laparoscopic incisions are CDI with dermabond, no erythema or drainage, there is early ecchymosis   Labs:     Latest Ref Rng & Units 01/08/2022    6:21 AM 01/07/2022    6:05 AM 01/06/2022    4:10 AM  CBC  WBC 4.0 - 10.5 K/uL 16.8  16.3  13.8   Hemoglobin 13.0 - 17.0 g/dL 10.5  11.1  10.6   Hematocrit 39.0 - 52.0 % 32.5  33.5  32.0   Platelets 150 - 400 K/uL 123  113  104       Latest Ref Rng & Units 01/07/2022    6:05 AM 01/06/2022    4:10 AM 01/05/2022    4:14 AM  CMP  Glucose 70 - 99 mg/dL 214  188  178   BUN 8 - 23 mg/dL '25  30  26   '$ Creatinine  0.61 - 1.24 mg/dL 0.99  1.28  1.14   Sodium 135 - 145 mmol/L 132  135  136   Potassium 3.5 - 5.1 mmol/L 4.4  4.6  4.3   Chloride 98 - 111 mmol/L 100  100  104   CO2 22 - 32 mmol/L '24  25  25   '$ Calcium 8.9 - 10.3 mg/dL 8.7  9.5  9.3      Imaging studies: No new pertinent imaging studies   Assessment/Plan:  88 y.o. male 2 Days Post-Op s/p robotic assisted laparoscopic cholecystectomy and liver biopsy   - I think it is reasonable to advance diet as tolerated - Continue surgical drain; monitor output; he will go home with this  - Continue IV Abx (Cefepime)  - Monitor leukocytosis              - Monitor abdominal examination - Pain control prn; antiemetics prn - Mobilize as feasible; therapies on board; no recommendations   - Follow up pathology / liver biopsy - Further management per primary service     - Discharge Planning: Not much to add from surgical perspective; advance diet as tolerated; he will go home with drain and I will update instructions. He will need to follow up in 7-10 days for possible drain removal. We will remain available  All  of the above findings and recommendations were discussed with the patient, and the medical team, and all of patient's questions were answered to his expressed satisfaction.  -- Edison Simon, PA-C [facility] 01/08/2022, 7:47 AM M-F: 7am - 4pm

## 2022-01-08 NOTE — Progress Notes (Signed)
Physical Therapy Treatment Patient Details Name: Randy Frazier MRN: [id number] DOB: 1927-10-26 Today's Date: 01/08/2022   History of Present Illness 88 y/o male presented to ED on 01/01/22 for upper abdominal pain and difficulty swallowing. Admitted for concern of sepsis of unknown origin. S/p laparoscopic cholecystectomy on 11/27. PMH: HTN, T2DM, CAD s/p CABG, hx of CVA.    PT Comments    Patient continues to complain of abdominal soreness with movement. Instructed patient on log roll and breathing technique to reduce abdominal pressure with movement. Able to complete bed mobility and transfer with min guard. Ambulated short distance in hallway with use of IV pole and min guard for safety. Attempted ambulation with no AD but patient complaining of increased pain. Discussed use of cane at home for mobility to provide support, patient verbalized understanding. D/c plan remains appropriate.     Recommendations for follow up therapy are one component of a multi-disciplinary discharge planning process, led by the attending physician.  Recommendations may be updated based on patient status, additional functional criteria and insurance authorization.  Follow Up Recommendations  No PT follow up     Assistance Recommended at Discharge Intermittent Supervision/Assistance  Patient can return home with the following A little help with walking and/or transfers;A little help with bathing/dressing/bathroom;Assistance with cooking/housework;Assist for transportation;Help with stairs or ramp for entrance   Equipment Recommendations  Rolling G[place]ana Kewley (2 wheels)    Recommendations for Other Services       Precautions / Restrictions Precautions Precautions: Fall Precaution Comments: abdominal incision, JP drain on L flank Restrictions Weight Bearing Restrictions: No     Mobility  Bed Mobility Overal bed mobility: Needs Assistance Bed Mobility: Rolling, Sidelying to Sit Rolling: Min  guard Sidelying to sit: Min guard       General bed mobility comments: min guard for safety. increased time to complete due to patient complaining of soreness in abdomen with mobility. Cues for log roll technique and breathing technique to reduce abdominal pressure when sitting up    Transfers Overall transfer level: Needs assistance Equipment used: 1 person hand held assist Transfers: Sit to/from Stand Sit to Stand: Min guard           General transfer comment: min guard for safety. Cues for breathing technique.    Ambulation/Gait Ambulation/Gait assistance: Min guard Gait Distance (Feet): 50 Feet Assistive device: IV Pole Gait Pattern/deviations: Step-through pattern, Decreased stride length Gait velocity: decreased     General Gait Details: min guard for safety. Use of IV pole for minimal support. Discussed use of cane for mobility at home   Stairs             Wheelchair Mobility    Modified Rankin (Stroke Patients Only)       Balance Overall balance assessment: Mild deficits observed, not formally tested                                          Cognition Arousal/Alertness: Awake/alert Behavior During Therapy: WFL for tasks assessed/performed Overall Cognitive Status: Within Functional Limits for tasks assessed                                          Exercises      General Comments        Pertinent  Vitals/Pain Pain Assessment Pain Assessment: Faces Faces Pain Scale: Hurts little more Pain Location: abdomen Pain Descriptors / Indicators: Sore Pain Intervention(s): Monitored during session, Repositioned    Home Living                          Prior Function            PT Goals (current goals can now be found in the care plan section) Acute Rehab PT Goals Patient Stated Goal: to reduce pain PT Goal Formulation: With patient/family Time For Goal Achievement: 01/21/22 Potential to Achieve  Goals: Good Progress towards PT goals: Progressing toward goals    Frequency    Min 2X/week      PT Plan Current plan remains appropriate    Co-evaluation              AM-PAC PT "6 Clicks" Mobility   Outcome Measure  Help needed turning from your back to your side while in a flat bed without using bedrails?: A Little Help needed moving from lying on your back to sitting on the side of a flat bed without using bedrails?: A Little Help needed moving to and from a bed to a chair (including a wheelchair)?: A Little Help needed standing up from a chair using your arms (e.g., wheelchair or bedside chair)?: A Little Help needed to walk in hospital room?: A Little Help needed climbing 3-5 steps with a railing? : A Little 6 Click Score: 18    End of Session   Activity Tolerance: Patient tolerated treatment well Patient left: in chair;with call bell/phone within reach;with family/visitor present Nurse Communication: Mobility status PT Visit Diagnosis: Muscle weakness (generalized) (M62.81);Difficulty in walking, not elsewhere classified (R26.2)     Time: 9480-1655 PT Time Calculation (min) (ACUTE ONLY): 25 min  Charges:  $Therapeutic Activity: 23-37 mins                     Randy Frazier Randy Frazier PT, DPT [facility] - Acute Rehabilitation Services    Randy Frazier A Randy Frazier 01/08/2022, 10:39 AM

## 2022-01-08 NOTE — Progress Notes (Signed)
PROGRESS NOTE    Randy Frazier  OIZ:[id number] DOB: [birth year] DOA: 01/01/2022 PCP: Pcp, No   Brief Narrative: This 88 years old male with PMH significant for hypertension, insulin-dependent diabetes mellitus, CAD s/p CABG, history of CVA with no residual  now presented in the ED with complaints of loss of appetite, shaking chills, upper abdominal pain for one day. He was febrile on arrival was given fluid resuscitation.  He was hypoxic requiring 2 L of supplemental oxygen.  CTA ruled out pulmonary embolism.  Blood cultures were collected and patient was admitted for sepsis secondary to unknown cause. Blood cultures positive for E. coli and Klebsiella.  Continued on IV antibiotics.  Patient underwent laparoscopic cholecystectomy for biliary dyskinesia.  General surgery signed off.  Patient can be discharged on oral antibiotics in 1 to 2 days.  Assessment & Plan:   Principal Problem:   SIRS (systemic inflammatory response syndrome) (HCC) Active Problems:   Epigastric pain   Cirrhosis (HCC)   CAD, AUTOLOGOUS BYPASS GRAFT   DM type 2 with diabetic peripheral neuropathy (HCC)   History of CVA (cerebrovascular accident)   Liver mass   Acute respiratory failure with hypoxia (HCC)   AKI (acute kidney injury) ([place])   Chronic cholecystitis with calculus  Suspected sepsis: E. Coli / Enterococci bacteremia: Acute Cholecystitis: Patient presented with chills, rigors, loss of appetite, upper abdominal pain and found to have fever with leukocytosis and elevated lactic acid. COVID and influenza PCR were negative and no evidence for infection seen on chest CT  Started on empiric antibiotics and was given IV fluid resuscitation. Lactic acid 4.2, improved with fluid resuscitation Procalcitonin 7.18 trended up to 22.03, WBC 19.2, UA : unremarkable. Continue IV cefepime ( today is day 7) [place] RUQ: Gallstones and gallbladder sludge. Mild gallbladder wall Thickening.   Blood cultures are growing E.  coli, Enterococci. Patient continues to have leukocytosis despite being on antibiotics. General surgery consulted for gallstones,  recommended HIDA scan. HIDA scan result consistent with biliary dyskinesia. Patient underwent robotic assisted cholecystectomy POD #2.  Tolerated well. Continue IV cefepime, can be changed to ciprofloxacin at discharge for 5 days. Patient can be discharged home with a drain that can be removed on surgery follow-up.  Acute kidney injury: > Resolved. SCr is 1.60 on admission; baseline appears to be closer to 1.1  Likely prerenal in setting of suspected sepsis and anorexia with hypovolemia  Continue IVF hydration, hold ACE-i, renally-dose medications,  Serum creatinine improving 1.61> 1.41 >1.37>1.22 >1.14>1.28>0.99   Acute hypoxic respiratory failure: > Resolved. He was hypoxic requiring supplemental oxygen. He denies cough and there is no PE or acute pulmonary findings on CTA chest in ED  He is weaned down to room air.  Acute hypoxia resolved.   Cirrhosis /Liver masses Recent diagnosis, concerning for hepatocellular carcinoma, had initial GI consult this month and saw oncology for the first time on 12/31/21  Continue GI and oncology follow-up on discharge as planned.   Diabetes Mellitus HbA1c was 7.3% in October 2023  Continue regular insulin sliding scale   Essential hypertension Continue home medications.   Coronary artery disease- Continue statin and antiplatelets, hold beta-blocker initially given concern for evolving sepsis     History of CVA: Continue statin and antiplatelets   DVT prophylaxis: Heparin sq Code Status:Full code Family Communication:  No family at bed side. Disposition Plan:   Status is: Inpatient Remains inpatient appropriate because: Admitted for suspected sepsis secondary to unknown cause.  Blood cultures positive for  E. coli/Enterobacter.[name] He underwent laparoscopic cholecystectomy. POD # 2   Anticipated Discharge home in  1 to 2 days on oral antibiotics ciprofloxacin for 5 days.  Consultants:  General surgery  Procedures: CTA chest, HIDA scan, scheduled laparoscopic cholecystectomy today Antimicrobials:  Anti-infectives (From admission, onward)    Start     Dose/Rate Route Frequency Ordered Stop   01/03/22 2100  vancomycin (VANCOREADY) IVPB 1750 mg/350 mL  Status:  Discontinued        1,750 mg 175 mL/hr over 120 Minutes Intravenous Every 48 hours 01/02/22 0049 01/02/22 0053   01/03/22 2100  vancomycin (VANCOREADY) IVPB 1750 mg/350 mL  Status:  Discontinued        1,750 mg 175 mL/hr over 120 Minutes Intravenous Every 48 hours 01/02/22 0053 01/02/22 0449   01/02/22 0900  ceFEPIme (MAXIPIME) 2 g in sodium chloride 0.9 % 100 mL IVPB  Status:  Discontinued        2 g 200 mL/hr over 30 Minutes Intravenous Every 12 hours 01/02/22 0047 01/02/22 0053   01/02/22 0900  ceFEPIme (MAXIPIME) 2 g in sodium chloride 0.9 % 100 mL IVPB        2 g 200 mL/hr over 30 Minutes Intravenous Every 12 hours 01/02/22 0053 01/09/22 0859   01/02/22 0845  metroNIDAZOLE (FLAGYL) IVPB 500 mg  Status:  Discontinued        500 mg 100 mL/hr over 60 Minutes Intravenous Every 12 hours 01/01/22 2320 01/02/22 0448   01/01/22 2045  ceFEPIme (MAXIPIME) 2 g in sodium chloride 0.9 % 100 mL IVPB        2 g 200 mL/hr over 30 Minutes Intravenous  Once 01/01/22 2033 01/01/22 2150   01/01/22 2045  metroNIDAZOLE (FLAGYL) IVPB 500 mg        500 mg 100 mL/hr over 60 Minutes Intravenous  Once 01/01/22 2033 01/01/22 2200   01/01/22 2045  vancomycin (VANCOCIN) IVPB 1000 mg/200 mL premix  Status:  Discontinued        1,000 mg 200 mL/hr over 60 Minutes Intravenous  Once 01/01/22 2033 01/01/22 2037   01/01/22 2045  vancomycin (VANCOREADY) IVPB 1500 mg/300 mL        1,500 mg 150 mL/hr over 120 Minutes Intravenous  Once 01/01/22 2037 01/01/22 2301      Subjective: Patient was seen and examined at bedside. Overnight events noted.  Patient reports doing  better , blood pressure has improved. He is s/p laparoscopic cholecystectomy post operative day # 2. He still reports having abdominal soreness, getting better.  Objective: Vitals:   01/07/22 2345 01/08/22 0353 01/08/22 0500 01/08/22 1155  BP: 139/73 (!) 150/69  (!) 142/60  Pulse: 64 72  72  Resp: '16 17  18  '$ Temp: (!) 97.5 F (36.4 C) 98.1 F (36.7 C)  97.9 F (36.6 C)  TempSrc: Oral Oral  Oral  SpO2: 96% 97%  96%  Weight:   76.3 kg   Height:        Intake/Output Summary (Last 24 hours) at 01/08/2022 1208 Last data filed at 01/08/2022 1003 Gross per 24 hour  Intake 734.21 ml  Output 1650 ml  Net -915.79 ml   Filed Weights   01/06/22 0537 01/07/22 0409 01/08/22 0500  Weight: 79.1 kg 79.1 kg 76.3 kg    Examination:  General exam: Appears comfortable, not in any acute distress, deconditioned Respiratory system: CTA bilaterally, no wheezing, no crackles, normal respiratory effort. Cardiovascular system: S1-S2 heard, regular rate and rhythm, no murmur. Gastrointestinal  system: Abdomen is soft, mildly sore, non distended, BS+.  S/p lap chole. Central nervous system: Alert and oriented x 3, no focal neurological deficits. Extremities:  No edema, no cyanosis, no clubbing Skin: No rashes, lesions or ulcers Psychiatry: Judgement and insight appear normal. Mood & affect appropriate.     Data Reviewed: I have personally reviewed following labs and imaging studies  CBC: Recent Labs  Lab 01/01/22 2007 01/02/22 0519 01/04/22 0413 01/05/22 0414 01/06/22 0410 01/07/22 0605 01/08/22 0621  WBC 15.9*   < > 15.8* 13.8* 13.8* 16.3* 16.8*  NEUTROABS 10.2*  --   --   --   --   --   --   HGB 11.5*   < > 10.4* 11.0* 10.6* 11.1* 10.5*  HCT 34.5*   < > 31.1* 32.4* 32.0* 33.5* 32.5*  MCV 83.7   < > 82.3 81.8 82.9 82.9 83.1  PLT 179   < > 108* 102* 104* 113* 123*   < > = values in this interval not displayed.   Basic Metabolic Panel: Recent Labs  Lab 01/03/22 0406 01/04/22 0413  01/05/22 0414 01/06/22 0410 01/07/22 0605  NA 135 130* 136 135 132*  K 4.2 4.0 4.3 4.6 4.4  CL 104 100 104 100 100  CO2 '23 22 25 25 24  '$ GLUCOSE 138* 177* 178* 188* 214*  BUN 33* 31* 26* 30* 25*  CREATININE 1.37* 1.22 1.14 1.28* 0.99  CALCIUM 8.3* 8.7* 9.3 9.5 8.7*  MG  --   --  1.8 1.8 1.9  PHOS  --   --  3.4 3.5 4.4   GFR: Estimated Creatinine Clearance: 52.5 mL/min (by C-G formula based on SCr of 0.99 mg/dL). Liver Function Tests: Recent Labs  Lab 01/01/22 2007 01/02/22 0519 01/03/22 0406 01/04/22 0413  AST 47* 44* 72* 61*  ALT '25 21 26 30  '$ ALKPHOS 121 109 87 126  BILITOT 1.7* 1.2 0.8 0.8  PROT 7.4 6.8 6.6 7.2  ALBUMIN 3.1* 2.9* 2.8* 2.9*   No results for input(s): "LIPASE", "AMYLASE" in the last 168 hours. No results for input(s): "AMMONIA" in the last 168 hours. Coagulation Profile: No results for input(s): "INR", "PROTIME" in the last 168 hours.  Cardiac Enzymes: No results for input(s): "CKTOTAL", "CKMB", "CKMBINDEX", "TROPONINI" in the last 168 hours. BNP (last 3 results) No results for input(s): "PROBNP" in the last 8760 hours. HbA1C: No results for input(s): "HGBA1C" in the last 72 hours. CBG: Recent Labs  Lab 01/07/22 1627 01/07/22 2136 01/07/22 2200 01/08/22 0811 01/08/22 1152  GLUCAP 160* 156* 169* 144* 242*   Lipid Profile: No results for input(s): "CHOL", "HDL", "LDLCALC", "TRIG", "CHOLHDL", "LDLDIRECT" in the last 72 hours. Thyroid Function Tests: No results for input(s): "TSH", "T4TOTAL", "FREET4", "T3FREE", "THYROIDAB" in the last 72 hours. Anemia Panel: No results for input(s): "VITAMINB12", "FOLATE", "FERRITIN", "TIBC", "IRON", "RETICCTPCT" in the last 72 hours. Sepsis Labs: Recent Labs  Lab 01/01/22 2007 01/02/22 0058 01/02/22 0519 01/02/22 1710 01/03/22 0406 01/05/22 0414  PROCALCITON  --  7.18 22.03  --  21.59 5.49  LATICACIDVEN 4.5* 4.2*  --  1.4  --   --     Recent Results (from the past 240 hour(s))  Blood Culture  (routine x 2)     Status: Abnormal   Collection Time: 01/01/22  8:07 PM   Specimen: BLOOD RIGHT ARM  Result Value Ref Range Status   Specimen Description   Final    BLOOD RIGHT ARM Performed at [facility], [address] [place], [place] [address]    Special Requests   Final    BOTTLES DRAWN AEROBIC AND ANAEROBIC Blood Culture results may not be optimal due to an excessive volume of blood received in culture bottles Performed at [facility], [address], [place], [place] [address]    Culture  Setup Time   Final    GRAM NEGATIVE RODS IN BOTH AEROBIC AND ANAEROBIC BOTTLES CRITICAL RESULT CALLED TO, READ BACK BY AND VERIFIED WITH: JASON ROBBINS 01/02/2022 AT 0406 SRR Performed at [facility], [address] [place], [place] [address]    Culture (A)  Final    ESCHERICHIA COLI ENTEROBACTER AEROGENES SUSCEPTIBILITIES PERFORMED ON PREVIOUS CULTURE WITHIN THE LAST 5 DAYS. Performed at [facility], [place] [address] [place], [place] [address]    Report Status 01/05/2022 FINAL  Final  Blood Culture (routine x 2)     Status: Abnormal   Collection Time: 01/01/22  8:12 PM   Specimen: BLOOD LEFT ARM  Result Value Ref Range Status   Specimen Description   Final    BLOOD LEFT ARM Performed at [facility], [address] [place], [place] [address]    Special Requests   Final    Blood Culture results may not be optimal due to an inadequate volume of blood received in culture bottles Performed at [facility], [address], [place], [place] [address]    Culture  Setup Time   Final    GRAM NEGATIVE RODS IN BOTH AEROBIC AND ANAEROBIC BOTTLES Organism ID to follow CRITICAL RESULT CALLED TO, READ BACK BY AND VERIFIED WITH: JASON ROBBIN 01/02/2022 AT 0406 SRR Performed at [facility], [address] [place],  [address]    Culture ESCHERICHIA COLI ENTEROBACTER AEROGENES  (A)  Final   Report Status 01/05/2022  FINAL  Final   Organism ID, Bacteria ESCHERICHIA COLI  Final   Organism ID, Bacteria ENTEROBACTER AEROGENES  Final      Susceptibility   Enterobacter aerogenes - MIC*    CEFAZOLIN >=64 RESISTANT Resistant     CEFEPIME <=0.12 SENSITIVE Sensitive     CEFTAZIDIME <=1 SENSITIVE Sensitive     CEFTRIAXONE <=0.25 SENSITIVE Sensitive     CIPROFLOXACIN <=0.25 SENSITIVE Sensitive     GENTAMICIN <=1 SENSITIVE Sensitive     IMIPENEM 2 SENSITIVE Sensitive     TRIMETH/SULFA <=20 SENSITIVE Sensitive     PIP/TAZO <=4 SENSITIVE Sensitive     * ENTEROBACTER AEROGENES   Escherichia coli - MIC*    AMPICILLIN <=2 SENSITIVE Sensitive     CEFAZOLIN <=4 SENSITIVE Sensitive     CEFEPIME <=0.12 SENSITIVE Sensitive     CEFTAZIDIME <=1 SENSITIVE Sensitive     CEFTRIAXONE <=0.25 SENSITIVE Sensitive     CIPROFLOXACIN <=0.25 SENSITIVE Sensitive     GENTAMICIN <=1 SENSITIVE Sensitive     IMIPENEM <=0.25 SENSITIVE Sensitive     TRIMETH/SULFA <=20 SENSITIVE Sensitive     AMPICILLIN/SULBACTAM <=2 SENSITIVE Sensitive     PIP/TAZO <=4 SENSITIVE Sensitive     * ESCHERICHIA COLI  Blood Culture ID Panel (Reflexed)     Status: Abnormal   Collection Time: 01/01/22  8:12 PM  Result Value Ref Range Status   Enterococcus faecalis NOT DETECTED NOT DETECTED Final   Enterococcus Faecium NOT DETECTED NOT DETECTED Final   Listeria monocytogenes NOT DETECTED NOT DETECTED Final   Staphylococcus species NOT DETECTED NOT DETECTED Final   Staphylococcus aureus (BCID) NOT DETECTED NOT DETECTED  Final   Staphylococcus epidermidis NOT DETECTED NOT DETECTED Final   Staphylococcus lugdunensis NOT DETECTED NOT DETECTED Final   Streptococcus species NOT DETECTED NOT DETECTED Final   Streptococcus agalactiae NOT DETECTED NOT DETECTED Final   Streptococcus pneumoniae NOT DETECTED NOT DETECTED Final   Streptococcus pyogenes NOT DETECTED NOT DETECTED Final   A.calcoaceticus-baumannii NOT DETECTED NOT DETECTED Final   Bacteroides fragilis  NOT DETECTED NOT DETECTED Final   Enterobacterales DETECTED (A) NOT DETECTED Final    Comment: CRITICAL RESULT CALLED TO, READ BACK BY AND VERIFIED WITH: JASON ROBBINS 01/02/2022 AT 0406 SRR    Enterobacter cloacae complex NOT DETECTED NOT DETECTED Final   Escherichia coli DETECTED (A) NOT DETECTED Final    Comment: CRITICAL RESULT CALLED TO, READ BACK BY AND VERIFIED WITH: JASON ROBBINS 01/02/2022 AT 0406 SRR    Klebsiella aerogenes DETECTED (A) NOT DETECTED Final    Comment: CRITICAL RESULT CALLED TO, READ BACK BY AND VERIFIED WITH: JASON ROBBINS 01/02/2022 AT 0406 SRR    Klebsiella oxytoca NOT DETECTED NOT DETECTED Final   Klebsiella pneumoniae NOT DETECTED NOT DETECTED Final   Proteus species NOT DETECTED NOT DETECTED Final   Salmonella species NOT DETECTED NOT DETECTED Final   Serratia marcescens NOT DETECTED NOT DETECTED Final   Haemophilus influenzae NOT DETECTED NOT DETECTED Final   Neisseria meningitidis NOT DETECTED NOT DETECTED Final   Pseudomonas aeruginosa NOT DETECTED NOT DETECTED Final   Stenotrophomonas maltophilia NOT DETECTED NOT DETECTED Final   Candida albicans NOT DETECTED NOT DETECTED Final   Candida auris NOT DETECTED NOT DETECTED Final   Candida glabrata NOT DETECTED NOT DETECTED Final   Candida krusei NOT DETECTED NOT DETECTED Final   Candida parapsilosis NOT DETECTED NOT DETECTED Final   Candida tropicalis NOT DETECTED NOT DETECTED Final   Cryptococcus neoformans/gattii NOT DETECTED NOT DETECTED Final   CTX-M ESBL NOT DETECTED NOT DETECTED Final   Carbapenem resistance IMP NOT DETECTED NOT DETECTED Final   Carbapenem resistance KPC NOT DETECTED NOT DETECTED Final   Carbapenem resistance NDM NOT DETECTED NOT DETECTED Final   Carbapenem resist OXA 48 LIKE NOT DETECTED NOT DETECTED Final   Carbapenem resistance VIM NOT DETECTED NOT DETECTED Final    Comment: Performed at [facility], [address] [place], [place] [address]  Resp Panel by  RT-PCR (Flu A&B, Covid) Anterior Nasal Swab     Status: None   Collection Time: 01/01/22  8:15 PM   Specimen: Anterior Nasal Swab  Result Value Ref Range Status   SARS Coronavirus 2 by RT PCR NEGATIVE NEGATIVE Final    Comment: (NOTE) SARS-CoV-2 target nucleic acids are NOT DETECTED.  The SARS-CoV-2 RNA is generally detectable in upper respiratory specimens during the acute phase of infection. The lowest concentration of SARS-CoV-2 viral copies this assay can detect is 138 copies/mL. A negative result does not preclude SARS-Cov-2 infection and should not be used as the sole basis for treatment or other patient management decisions. A negative result may occur with  improper specimen collection/handling, submission of specimen other than nasopharyngeal swab, presence of viral mutation(s) within the areas targeted by this assay, and inadequate number of viral copies(<138 copies/mL). A negative result must be combined with clinical observations, patient history, and epidemiological information. The expected result is Negative.  Fact Sheet for Patients:  EntrepreneurPulse.com.au  Fact Sheet for Healthcare Providers:  IncredibleEmployment.be  This test is no t yet approved or cleared by the [place] FDA and  has been authorized for detection  and/or diagnosis of SARS-CoV-2 by FDA under an Emergency Use Authorization (EUA). This EUA will remain  in effect (meaning this test can be used) for the duration of the COVID-19 declaration under Section 564(b)(1) of the Act, 21 U.S.C.section 360bbb-3(b)(1), unless the authorization is terminated  or revoked sooner.       Influenza A by PCR NEGATIVE NEGATIVE Final   Influenza B by PCR NEGATIVE NEGATIVE Final    Comment: (NOTE) The Xpert Xpress SARS-CoV-2/FLU/RSV plus assay is intended as an aid in the diagnosis of influenza from Nasopharyngeal swab specimens and should not be used as a sole basis for  treatment. Nasal washings and aspirates are unacceptable for Xpert Xpress SARS-CoV-2/FLU/RSV testing.  Fact Sheet for Patients: EntrepreneurPulse.com.au  Fact Sheet for Healthcare Providers: IncredibleEmployment.be  This test is not yet approved or cleared by the [place] FDA and has been authorized for detection and/or diagnosis of SARS-CoV-2 by FDA under an Emergency Use Authorization (EUA). This EUA will remain in effect (meaning this test can be used) for the duration of the COVID-19 declaration under Section 564(b)(1) of the Act, 21 U.S.C. section 360bbb-3(b)(1), unless the authorization is terminated or revoked.  Performed at [facility], [address] [place], [place] [address]   Surgical pcr screen     Status: Abnormal   Collection Time: 01/06/22 12:00 PM   Specimen: Nasal Mucosa; Nasal Swab  Result Value Ref Range Status   MRSA, PCR NEGATIVE NEGATIVE Final   Staphylococcus aureus POSITIVE (A) NEGATIVE Final    Comment: (NOTE) The Xpert SA Assay (FDA approved for NASAL specimens in patients 88 years of age and older), is one component of a comprehensive surveillance program. It is not intended to diagnose infection nor to guide or monitor treatment. Performed at [facility], [address] [place], [place] [address]     Radiology Studies: No results found.  Scheduled Meds:  amLODipine  10 mg Oral Daily   aspirin EC  81 mg Oral Daily   atorvastatin  10 mg Oral Daily   Chlorhexidine Gluconate Cloth  6 each Topical Q0600   hydrALAZINE  25 mg Oral Q8H   insulin aspart  0-5 Units Subcutaneous QHS   insulin aspart  0-6 Units Subcutaneous TID WC   metoprolol tartrate  25 mg Oral BID   mupirocin ointment  1 Application Nasal BID   pantoprazole  40 mg Oral Daily   sodium chloride flush  3 mL Intravenous Q12H   Continuous Infusions:  sodium chloride 75 mL/hr at 01/08/22 1001   sodium chloride Stopped  (01/08/22 0742)   ceFEPime (MAXIPIME) IV 2 g (01/08/22 1003)     LOS: 7 days    Time spent: 35 mins    Ebelyn Bohnet, MD [facility]   If 7PM-7AM, please contact night-coverage

## 2022-01-08 NOTE — Discharge Instructions (Signed)
  Follow-up with your PCP in 1 week. Follow-up with general surgery as scheduled.    In addition to included general post-operative instructions,  Diet: Resume home diet. Recommend avoiding or limiting fatty/greasy foods over the next few days/week. If you do eat these, you may (or may not) notice diarrhea. This is expected while your body adjusts to not having a gallbladder, and it typically resolves with time.    Activity: No heavy lifting >20 pounds (children, pets, laundry, garbage) or strenuous activity for 4 weeks, but light activity and walking are encouraged. Do not drive or drink alcohol if taking narcotic pain medications or having pain that might distract from driving.  Wound care: If you can keep drain site covered and water proofed, you may shower/get incision wet with soapy water and pat dry (do not rub incisions), but no baths or submerging incision underwater until follow-up.   Drain: Monitor and record drain output daily; I did provide a hand out for you in your discharge paperwork. Please remember to bring this to your follow up appointment.   Medications: Resume all home medications. For mild to moderate pain: acetaminophen (Tylenol) or ibuprofen/naproxen (if no kidney disease). Combining Tylenol with alcohol can substantially increase your risk of causing liver disease. Narcotic pain medications, if prescribed, can be used for severe pain, though may cause nausea, constipation, and drowsiness. Do not combine Tylenol and Percocet (or similar) within a 6 hour period as Percocet (and similar) contain(s) Tylenol. If you do not need the narcotic pain medication, you do not need to fill the prescription.  Call office [phone]) at any time if any questions, worsening pain, fevers/chills, bleeding, drainage from incision site, or other concerns.

## 2022-01-09 DIAGNOSIS — A419 Sepsis, unspecified organism: Secondary | ICD-10-CM

## 2022-01-09 DIAGNOSIS — J9601 Acute respiratory failure with hypoxia: Secondary | ICD-10-CM | POA: Diagnosis not present

## 2022-01-09 DIAGNOSIS — K810 Acute cholecystitis: Secondary | ICD-10-CM | POA: Diagnosis not present

## 2022-01-09 DIAGNOSIS — R6520 Severe sepsis without septic shock: Secondary | ICD-10-CM | POA: Diagnosis not present

## 2022-01-09 DIAGNOSIS — D696 Thrombocytopenia, unspecified: Secondary | ICD-10-CM | POA: Insufficient documentation

## 2022-01-09 DIAGNOSIS — E871 Hypo-osmolality and hyponatremia: Secondary | ICD-10-CM | POA: Insufficient documentation

## 2022-01-09 LAB — BASIC METABOLIC PANEL
Anion gap: 5 (ref 5–15)
BUN: 21 mg/dL (ref 8–23)
CO2: 23 mmol/L (ref 22–32)
Calcium: 8 mg/dL — ABNORMAL LOW (ref 8.9–10.3)
Chloride: 107 mmol/L (ref 98–111)
Creatinine, Ser: 1.12 mg/dL (ref 0.61–1.24)
GFR, Estimated: >60 mL/min (ref >60)
Glucose, Bld: 178 mg/dL — ABNORMAL HIGH (ref 70–99)
Potassium: 4.3 mmol/L (ref 3.5–5.1)
Sodium: 135 mmol/L (ref 135–145)

## 2022-01-09 LAB — MAGNESIUM: Magnesium: 1.8 mg/dL (ref 1.7–2.4)

## 2022-01-09 LAB — CBC
HCT: 30.2 % — ABNORMAL LOW (ref 39.0–52.0)
Hemoglobin: 9.8 g/dL — ABNORMAL LOW (ref 13.0–17.0)
MCH: 27.6 pg (ref 26.0–34.0)
MCHC: 32.5 g/dL (ref 30.0–36.0)
MCV: 85.1 fL (ref 80.0–100.0)
Platelets: 135 10*3/uL — ABNORMAL LOW (ref 150–400)
RBC: 3.55 MIL/uL — ABNORMAL LOW (ref 4.22–5.81)
RDW: 18.3 % — ABNORMAL HIGH (ref 11.5–15.5)
WBC: 17.2 10*3/uL — ABNORMAL HIGH (ref 4.0–10.5)
nRBC: 0 % (ref 0.0–0.2)

## 2022-01-09 LAB — PHOSPHORUS: Phosphorus: 2.3 mg/dL — ABNORMAL LOW (ref 2.5–4.6)

## 2022-01-09 LAB — GLUCOSE, CAPILLARY: Glucose-Capillary: 186 mg/dL — ABNORMAL HIGH (ref 70–99)

## 2022-01-09 MED ORDER — K PHOS MONO-SOD PHOS DI & MONO 155-852-130 MG PO TABS: 500.0000 mg | ORAL_TABLET | Freq: Three times a day (TID) | ORAL | 0 refills | Status: AC

## 2022-01-09 MED ORDER — PANTOPRAZOLE SODIUM 40 MG PO TBEC: 40.0000 mg | DELAYED_RELEASE_TABLET | Freq: Every day | ORAL | 0 refills | Status: AC

## 2022-01-09 MED ORDER — K PHOS MONO-SOD PHOS DI & MONO 155-852-130 MG PO TABS
500.0000 mg | ORAL_TABLET | Freq: Three times a day (TID) | ORAL | Status: DC
  Administered 2022-01-09: 500 mg via ORAL
  Filled 2022-01-09 (×2): qty 2

## 2022-01-09 MED ORDER — CIPROFLOXACIN HCL 500 MG PO TABS
500.0000 mg | ORAL_TABLET | Freq: Two times a day (BID) | ORAL | Status: DC
  Administered 2022-01-09: 500 mg via ORAL
  Filled 2022-01-09 (×2): qty 1

## 2022-01-09 MED ORDER — INSULIN DETEMIR 100 UNIT/ML ~~LOC~~ SOLN: 16.0000 [IU] | Freq: Every day | SUBCUTANEOUS | 0 refills | Status: DC

## 2022-01-09 MED ORDER — CIPROFLOXACIN HCL 500 MG PO TABS: 500.0000 mg | ORAL_TABLET | Freq: Two times a day (BID) | ORAL | 0 refills | Status: AC

## 2022-01-09 MED ORDER — OXYCODONE HCL 5 MG PO TABS: 5.0000 mg | ORAL_TABLET | ORAL | 0 refills | Status: DC | PRN

## 2022-01-09 NOTE — Progress Notes (Signed)
Mobility Specialist - Progress Note   01/09/22 1025  Mobility  Activity Refused mobility  $Mobility charge 1 Mobility    Pt refuses two mobility attempts this AM, no reason specified. Will attempt at another date and time.   Gretchen Short  Mobility Specialist  01/09/22 10:27 AM

## 2022-01-09 NOTE — TOC Transition Note (Signed)
Transition of Care [facility]) - CM/SW Discharge Note   Patient Details  Name: Randy Frazier MRN: [id number] Date of Birth: 1927/07/07  Transition of Care [facility]) CM/SW Contact:  Quin Hoop, LCSW Phone Number: 01/09/2022, 11:41 AM   Clinical Narrative:     TOC screening completed.  There were no needs.  Final next level of care: Home/Self Care Barriers to Discharge: No Barriers Identified   Patient Goals and CMS Choice Patient states their goals for this hospitalization and ongoing recovery are:: to return home CMS Medicare.gov Compare Post Acute Care list provided to:: Patient Choice offered to / list presented to : Patient  Discharge Placement  None                       Discharge Plan and Services: None                                      Social Determinants of Health (SDOH) Interventions     Readmission Risk Interventions    01/03/2022    9:38 AM  Readmission Risk Prevention Plan  Transportation Screening Complete  PCP or Specialist Appt within 3-5 Days Complete  HRI or [place] Complete  Social Work Consult for [place] Planning/Counseling Complete  Palliative Care Screening Not Applicable  Medication Review Press photographer) Complete

## 2022-01-09 NOTE — Discharge Summary (Addendum)
Physician Discharge Summary   Patient: Randy Frazier MRN: [id number] DOB: December 20, 1927  Admit date:     01/01/2022  Discharge date: 01/09/22  Discharge Physician: Sharen Hones   PCP: Pcp, No   Recommendations at discharge:   Follow-up with PCP in 1 week. Follow-up with general surgery as scheduled.  Discharge Diagnoses: Principal Problem:   SIRS (systemic inflammatory response syndrome) (HCC) Active Problems:   Epigastric pain   Cirrhosis (HCC)   CAD, AUTOLOGOUS BYPASS GRAFT   DM type 2 with diabetic peripheral neuropathy (HCC)   History of CVA (cerebrovascular accident)   Liver mass   Acute respiratory failure with hypoxia (HCC)   AKI (acute kidney injury) ([place])   Chronic cholecystitis with calculus   Thrombocytopenia (HCC)   Hypophosphatemia   Hyponatremia   Severe sepsis (HCC) Acute cholecystitis. Septicemia secondary to E. coli and Enterococcus. Resolved Problems:   * No resolved hospital problems. [facility] [place] [facility] Hospital Course: This 88 years old male with PMH significant for hypertension, insulin-dependent diabetes mellitus, CAD s/p CABG, history of CVA with no residual  now presented in the ED with complaints of loss of appetite, shaking chills, upper abdominal pain for one day. He was febrile on arrival was given fluid resuscitation.  He was hypoxic requiring 2 L of supplemental oxygen.  CTA ruled out pulmonary embolism.  Blood cultures were collected and patient was admitted for sepsis secondary to unknown cause. Blood cultures positive for E. coli and Klebsiella.  Continued on IV antibiotics.  Patient underwent laparoscopic cholecystectomy for biliary dyskinesia.   Assessment and Plan: Severe sepsis due to E. coli/Enterococcus bacteremia.  Secondary acute cholecystitis..  Ruled in, POA. Septicemia secondary to E. coli, Enterococcus. Acute cholecystitis. Acute kidney injury secondary to sepsis. Did met severe sepsis criteria at admission with fever of 100.8, tachycardia  105, hypotensive, significant lactic acidosis.  This is a secondary to acute cholecystitis.  Blood culture came back positive for E. coli and Enterococcus.  Patient was treated with cefepime.  Patient also status post cholecystectomy.  Before he has completed 7 days of cefepime, will transition to Cipro to complete a total course of 10 days antibiotics. Discussed with general surgery, patient is medically stable from their standpoint to discharge.  Will leave the drain in, follow-up with general surgery to remove it in the future.  Hypoxemic respiratory failure secondary to sepsis. Condition had improved.  Uncontrolled type 2 diabetes with hyperglycemia Patient was taking higher dose of Levemir at home, currently, he is on liquid diet, over the next few days, he will be advanced to soft diet and then regular heart healthy diet.  Currently, I will continue reduced dose of Levemir, may continue increased dose after appetite is normalized.  Essential hypertension. History of CVA. Resume all home blood pressure medicines.  Liver cirrhosis/liver mass. Concerning for hepatocellular carcinoma.  Follow-up with GI and oncology as outpatient.       Consultants: General surgery. Procedures performed: Cholecystectomy. Disposition: Home health Diet recommendation:  Discharge Diet Orders (From admission, onward)     Start     Ordered   01/09/22 0000  Diet general       Comments: Full liquid diet x1 day, soft diet x 2 days then regular heart healthy diet   01/09/22 1034           Full liquid diet DISCHARGE MEDICATION: Allergies as of 01/09/2022       Reactions   Ibuprofen Swelling   Penicillins Swelling, Hives   lips Other  reaction(s): Other (See Comments) Edema (lips)        Medication List     STOP taking these medications    insulin lispro 100 UNIT/ML KwikPen Commonly known as: HUMALOG   metFORMIN 500 MG 24 hr tablet Commonly known as: GLUCOPHAGE-XR   omeprazole 40 MG  capsule Commonly known as: PRILOSEC Replaced by: pantoprazole 40 MG tablet       TAKE these medications    amLODipine 10 MG tablet Commonly known as: NORVASC TAKE 1 TABLET BY MOUTH EVERY DAY   aspirin EC 81 MG tablet Take 81 mg by mouth.   atorvastatin 10 MG tablet Commonly known as: LIPITOR Take 1 tablet (10 mg total) by mouth every other day.   ciprofloxacin 500 MG tablet Commonly known as: CIPRO Take 1 tablet (500 mg total) by mouth 2 (two) times daily for 3 days.   clopidogrel 75 MG tablet Commonly known as: PLAVIX TAKE 1 TABLET(75 MG) BY MOUTH DAILY   ezetimibe 10 MG tablet Commonly known as: ZETIA Take 10 mg by mouth daily.   FLUoxetine 20 MG capsule Commonly known as: PROZAC Take 20 mg by mouth daily.   gabapentin 300 MG capsule Commonly known as: NEURONTIN Take 300 mg by mouth 3 (three) times daily.   insulin detemir 100 UNIT/ML injection Commonly known as: LEVEMIR Inject 0.16 mLs (16 Units total) into the skin daily. What changed:  how much to take Another medication with the same name was removed. Continue taking this medication, and follow the directions you see here.   lisinopril 20 MG tablet Commonly known as: ZESTRIL Take 20 mg by mouth daily.   Magnesium 500 MG Tabs Take 500 mg by mouth daily.   metoprolol tartrate 25 MG tablet Commonly known as: LOPRESSOR Take 25 mg by mouth 2 (two) times daily.   oxyCODONE 5 MG immediate release tablet Commonly known as: Oxy IR/ROXICODONE Take 1 tablet (5 mg total) by mouth every 4 (four) hours as needed for moderate pain.   pantoprazole 40 MG tablet Commonly known as: PROTONIX Take 1 tablet (40 mg total) by mouth daily. Start taking on: January 10, 2022 Replaces: omeprazole 40 MG capsule   phosphorus 155-852-130 MG tablet Commonly known as: K PHOS NEUTRAL Take 2 tablets (500 mg total) by mouth 3 (three) times daily for 1 day.               Discharge Care Instructions  (From admission,  onward)           Start     Ordered   01/09/22 0000  Discharge wound care:       Comments: Follow with general surgery   01/09/22 1034            Follow-up Information     Tylene Fantasia, PA-C. Schedule an appointment as soon as possible for a visit in 10 day(s).   Specialty: Physician Assistant Why: s/p laparoscopic cholecystectomy; with drain Contact information: [address] [place] [address] [phone]                Discharge Exam: Danley Danker Weights   01/07/22 0409 01/08/22 0500 01/09/22 0437  Weight: 79.1 kg 76.3 kg 76.2 kg   General exam: Appears calm and comfortable  Respiratory system: Clear to auscultation. Respiratory effort normal. Cardiovascular system: S1 & S2 heard, RRR. No JVD, murmurs, rubs, gallops or clicks. No pedal edema. Gastrointestinal system: Abdomen is nondistended, soft and nontender. No organomegaly or masses felt. Normal bowel sounds  heard. Central nervous system: Alert and oriented x2. No focal neurological deficits. Extremities: Symmetric 5 x 5 power. Skin: No rashes, lesions or ulcers Psychiatry: Judgement and insight appear normal. Mood & affect appropriate.    Condition at discharge: good  The results of significant diagnostics from this hospitalization (including imaging, microbiology, ancillary and laboratory) are listed below for reference.   Imaging Studies: NM Hepato W/EF  Result Date: 01/04/2022 CLINICAL DATA:  Abdominal pain.  Evaluate gallbladder motility. EXAM: NUCLEAR MEDICINE HEPATOBILIARY IMAGING WITH GALLBLADDER EF TECHNIQUE: Sequential images of the abdomen were obtained out to 60 minutes following intravenous administration of radiopharmaceutical. After oral ingestion of Ensure, gallbladder ejection fraction was determined. At 60 min, normal ejection fraction is greater than 33%. RADIOPHARMACEUTICALS:  5.13 mCi Tc-74m Choletec IV COMPARISON:  None Available. FINDINGS: Prompt uptake and biliary  excretion of activity by the liver is seen. Gallbladder activity is visualized, consistent with patency of cystic duct. Biliary activity passes into small bowel, consistent with patent common bile duct. Calculated gallbladder ejection fraction is 16%. (Normal gallbladder ejection fraction with Ensure is greater than 33%.) IMPRESSION: 1. The gallbladder ejection fraction is abnormally low consistent with biliary dyskinesia. Electronically Signed   By: DDorise BullionIII M.D.   On: 01/04/2022 11:51   UKoreaAbdomen Limited RUQ (LIVER/GB)  Result Date: 01/02/2022 CLINICAL DATA:  Systemic inflammatory response syndrome. Upper abdominal pain. EXAM: ULTRASOUND ABDOMEN LIMITED RIGHT UPPER QUADRANT COMPARISON:  MRI 12/19/2021 FINDINGS: Gallbladder: Stones are identified within the gallbladder as noted recent MRI. Tumefactive sludge is noted on today's study. No pericholecystic fluid or sonographic Murphy's sign. Mild gallbladder wall thickening measuring 3.3 mm. Common bile duct: Diameter: 4.5 mm Liver: The liver appears heterogeneous with a lobular contour concerning for cirrhosis. Scattered hypoechoic areas are noted concerning for multifocal liver low metastasis versus hepatomas. Portal vein is patent on color Doppler imaging with normal direction of blood flow towards the liver. Other: None. IMPRESSION: 1. Gallstones and gallbladder sludge. Mild gallbladder wall thickening. No pericholecystic fluid or sonographic Murphy's sign. 2. Cirrhosis of the liver. 3. Multiple hypoechoic liver lesions suspicious for either metastatic disease or multifocal hepatoma. Electronically Signed   By: TKerby MoorsM.D.   On: 01/02/2022 05:33   CT Angio Chest PE W and/or Wo Contrast  Result Date: 01/01/2022 CLINICAL DATA:  Chest pain and hypoxia.  Difficulty swallowing. EXAM: CT ANGIOGRAPHY CHEST WITH CONTRAST TECHNIQUE: Multidetector CT imaging of the chest was performed using the standard protocol during bolus administration of  intravenous contrast. Multiplanar CT image reconstructions and MIPs were obtained to evaluate the vascular anatomy. RADIATION DOSE REDUCTION: This exam was performed according to the departmental dose-optimization program which includes automated exposure control, adjustment of the mA and/or kV according to patient size and/or use of iterative reconstruction technique. CONTRAST:  652mOMNIPAQUE IOHEXOL 350 MG/ML SOLN COMPARISON:  12/09/2021 FINDINGS: Cardiovascular: Good opacification of the central and segmental pulmonary arteries. No focal filling defects. No evidence of significant pulmonary embolus. Normal heart size. No pericardial effusions. Normal caliber thoracic aorta. No dissection. Calcification of the aorta and coronary arteries. Postoperative changes consistent with coronary bypass. Mediastinum/Nodes: Thyroid gland is unremarkable. Prominent lymph nodes in the supraclavicular regions and mediastinum. Largest are subcarinal lymph nodes measuring about 12 mm short axis dimension. Appearances are similar to prior study. Nonspecific etiology, likely reactive. Lungs/Pleura: Atelectasis in the lung bases. No airspace disease or consolidation. No pleural effusions. No pneumothorax. Upper Abdomen: Nodular contour to the liver suggesting hepatic cirrhosis. Small esophageal  hiatal hernia. Scattered upper abdominal lymph nodes without pathologic enlargement. Musculoskeletal: Degenerative changes. No destructive bone lesions. Sternotomy wires. Review of the MIP images confirms the above findings. IMPRESSION: 1. No evidence of significant pulmonary embolus. 2. Aortic atherosclerosis. 3. No evidence of active pulmonary disease. 4. Nonspecific mediastinal lymphadenopathy. No change since prior study. 5. Probable hepatic cirrhosis. Electronically Signed   By: Lucienne Capers M.D.   On: 01/01/2022 22:05   DG Chest Port 1 View  Result Date: 01/01/2022 CLINICAL DATA:  Chest pain EXAM: PORTABLE CHEST 1 VIEW  COMPARISON:  12/08/2021 FINDINGS: Prior CABG. Heart and mediastinal contours are within normal limits. No focal opacities or effusions. No acute bony abnormality. IMPRESSION: No active cardiopulmonary disease. Electronically Signed   By: Rolm Baptise M.D.   On: 01/01/2022 21:04   MR ABDOMEN WWO CONTRAST  Result Date: 12/20/2021 CLINICAL DATA:  Bilateral liver lesions on ultrasound.  Cirrhosis. EXAM: MRI ABDOMEN WITHOUT AND WITH CONTRAST TECHNIQUE: Multiplanar multisequence MR imaging of the abdomen was performed both before and after the administration of intravenous contrast. CONTRAST:  10m GADAVIST GADOBUTROL 1 MMOL/ML IV SOLN COMPARISON:  12/08/2021 CT and 12/14/2021 ultrasound. FINDINGS: Lower chest: Cardiomegaly without pericardial or pleural effusion. Hepatobiliary: Mild cirrhosis. Dominant mass extending off the superior aspect of segment 2 measures 6.1 x 3.8 by 3.5 cm on 14/20 and 52/19. Demonstrates peripheral arterial and portal venous/delayed enhancement. Categorized as LR M. Multiple, on the order of 40-50 foci of heterogeneous arterial phase hyperenhancement throughout both lobes of the liver on series 13. Example 1.3 cm within the posterior right hepatic lobe on 33/3. This demonstrates vague non peripheral washout on 33/15 and is considered LR 4. The majority of the other lesions are too small to entirely characterized on portal venous phase and delayed images, but are suspicious for multifocal hepatocellular carcinoma (LR 3). Gallstones of up to 6 mm without acute cholecystitis or biliary duct dilatation. Pancreas:  Normal, without mass or ductal dilatation. Spleen:  Normal in size, without focal abnormality. Adrenals/Urinary Tract: Left adrenal nodule of 1.1 cm on 42/15 is poorly evaluated on motion degraded in an out of phase images. Not readily apparent on 04/01/20 CT. Bilateral renal cysts of up to 8.4 cm . In the absence of clinically indicated signs/symptoms require(s) no independent  follow-up. No hydronephrosis. Stomach/Bowel: Normal stomach and small bowel. Colonic stool burden suggests constipation. Vascular/Lymphatic: Aortic atherosclerosis. Patent splenic vein. The peripheral left portal vein is likely involved with the dominant high left hepatic lobe tumor. Prominent nodes in the porta hepatis are similar to 2022 and likely reactive. Example at 1.0 cm on 34/15. Chronic soft tissue thickening in the small bowel mesentey is likely related to mesenteric adenitis/panniculitis. Other:  No ascites. Musculoskeletal: No acute osseous abnormality. IMPRESSION: 1. Dominant high left hepatic lobe mass in the setting of cirrhosis. Although this is favored to represent hepatocellular carcinoma, it is technically classified as LR-M. Consider tissue sampling. 2. Innumerable tiny foci of arterial phase hyperenhancement, some of which are hypoenhancing on portal venous phase imaging. Suspicious for multifocal hepatocellular carcinoma, but technically classified as LR 4 and LR 3. 3. Left adrenal nodule which is indeterminate. Not readily apparent on CT of 2022. Cannot exclude metastatic disease. 4. Cholelithiasis 5.  Aortic Atherosclerosis (ICD10-I70.0). 6.  Possible constipation. Electronically Signed   By: KAbigail MiyamotoM.D.   On: 12/20/2021 16:02   UKoreaAbdomen Limited RUQ (LIVER/GB)  Result Date: 12/14/2021 CLINICAL DATA:  88year old male with UPPER abdominal pain. EXAM: ULTRASOUND ABDOMEN  LIMITED RIGHT UPPER QUADRANT COMPARISON:  12/08/2021 CT and prior studies FINDINGS: Gallbladder: Multiple mobile gallstones are noted, the largest measuring 1.3 cm. Gallbladder sludge is present. There is no evidence of gallbladder wall thickening, pericholecystic fluid or sonographic Murphy sign. Common bile duct: Diameter: 3.7 mm. There is no evidence of intrahepatic or extrahepatic biliary dilatation. Liver: Nodular hepatic contour likely represent cirrhosis. Heterogeneous hepatic echotexture noted with multiple  ill-defined hypoechoic areas noted, the largest measuring 2.8 cm in the RIGHT lobe and 2.1 cm in the LEFT lobe. Portal vein is patent on color Doppler imaging with normal direction of blood flow towards the liver. Other: None. IMPRESSION: 1. Cirrhosis with heterogeneous hepatic echotexture and multiple ill-defined hypoechoic areas, the largest measuring 2.8 cm in the RIGHT lobe and 2.1 cm in the LEFT lobe. These are indeterminate and hepatocellular carcinoma not excluded. Further evaluation with MRI without and with contrast is recommended. 2. Cholelithiasis and gallbladder sludge without evidence of acute cholecystitis. 3. No biliary dilatation. Electronically Signed   By: Margarette Canada M.D.   On: 12/14/2021 14:50    Microbiology: Results for orders placed or performed during the hospital encounter of 01/01/22  Blood Culture (routine x 2)     Status: Abnormal   Collection Time: 01/01/22  8:07 PM   Specimen: BLOOD RIGHT ARM  Result Value Ref Range Status   Specimen Description   Final    BLOOD RIGHT ARM Performed at [facility], [address], [place], [place] [address]    Special Requests   Final    BOTTLES DRAWN AEROBIC AND ANAEROBIC Blood Culture results may not be optimal due to an excessive volume of blood received in culture bottles Performed at [facility], [address] [place], [place] [address]    Culture  Setup Time   Final    GRAM NEGATIVE RODS IN BOTH AEROBIC AND ANAEROBIC BOTTLES CRITICAL RESULT CALLED TO, READ BACK BY AND VERIFIED WITH: JASON ROBBINS 01/02/2022 AT 0406 SRR Performed at [facility], [address] [place], [place] [address]    Culture (A)  Final    ESCHERICHIA COLI ENTEROBACTER AEROGENES SUSCEPTIBILITIES PERFORMED ON PREVIOUS CULTURE WITHIN THE LAST 5 DAYS. Performed at [facility], [place] [address], [place], [place] [address]    Report Status 01/05/2022 FINAL  Final  Blood Culture (routine x 2)     Status: Abnormal    Collection Time: 01/01/22  8:12 PM   Specimen: BLOOD LEFT ARM  Result Value Ref Range Status   Specimen Description   Final    BLOOD LEFT ARM Performed at [facility], [address] [place], [place] [address]    Special Requests   Final    Blood Culture results may not be optimal due to an inadequate volume of blood received in culture bottles Performed at [facility], [address] [place], [place] [address]    Culture  Setup Time   Final    GRAM NEGATIVE RODS IN BOTH AEROBIC AND ANAEROBIC BOTTLES Organism ID to follow CRITICAL RESULT CALLED TO, READ BACK BY AND VERIFIED WITH: Romualdo Bolk 01/02/2022 AT 0406 SRR Performed at [facility], [address] [place], [place] [address]    Culture ESCHERICHIA COLI ENTEROBACTER AEROGENES  (A)  Final   Report Status 01/05/2022 FINAL  Final   Organism ID, Bacteria ESCHERICHIA COLI  Final   Organism ID, Bacteria ENTEROBACTER AEROGENES  Final      Susceptibility   Enterobacter aerogenes -  MIC*    CEFAZOLIN >=64 RESISTANT Resistant     CEFEPIME <=0.12 SENSITIVE Sensitive     CEFTAZIDIME <=1 SENSITIVE Sensitive     CEFTRIAXONE <=0.25 SENSITIVE Sensitive     CIPROFLOXACIN <=0.25 SENSITIVE Sensitive     GENTAMICIN <=1 SENSITIVE Sensitive     IMIPENEM 2 SENSITIVE Sensitive     TRIMETH/SULFA <=20 SENSITIVE Sensitive     PIP/TAZO <=4 SENSITIVE Sensitive     * ENTEROBACTER AEROGENES   Escherichia coli - MIC*    AMPICILLIN <=2 SENSITIVE Sensitive     CEFAZOLIN <=4 SENSITIVE Sensitive     CEFEPIME <=0.12 SENSITIVE Sensitive     CEFTAZIDIME <=1 SENSITIVE Sensitive     CEFTRIAXONE <=0.25 SENSITIVE Sensitive     CIPROFLOXACIN <=0.25 SENSITIVE Sensitive     GENTAMICIN <=1 SENSITIVE Sensitive     IMIPENEM <=0.25 SENSITIVE Sensitive     TRIMETH/SULFA <=20 SENSITIVE Sensitive     AMPICILLIN/SULBACTAM <=2 SENSITIVE Sensitive     PIP/TAZO <=4 SENSITIVE Sensitive     * ESCHERICHIA COLI  Blood Culture ID Panel  (Reflexed)     Status: Abnormal   Collection Time: 01/01/22  8:12 PM  Result Value Ref Range Status   Enterococcus faecalis NOT DETECTED NOT DETECTED Final   Enterococcus Faecium NOT DETECTED NOT DETECTED Final   Listeria monocytogenes NOT DETECTED NOT DETECTED Final   Staphylococcus species NOT DETECTED NOT DETECTED Final   Staphylococcus aureus (BCID) NOT DETECTED NOT DETECTED Final   Staphylococcus epidermidis NOT DETECTED NOT DETECTED Final   Staphylococcus lugdunensis NOT DETECTED NOT DETECTED Final   Streptococcus species NOT DETECTED NOT DETECTED Final   Streptococcus agalactiae NOT DETECTED NOT DETECTED Final   Streptococcus pneumoniae NOT DETECTED NOT DETECTED Final   Streptococcus pyogenes NOT DETECTED NOT DETECTED Final   A.calcoaceticus-baumannii NOT DETECTED NOT DETECTED Final   Bacteroides fragilis NOT DETECTED NOT DETECTED Final   Enterobacterales DETECTED (A) NOT DETECTED Final    Comment: CRITICAL RESULT CALLED TO, READ BACK BY AND VERIFIED WITH: JASON ROBBINS 01/02/2022 AT 0406 SRR    Enterobacter cloacae complex NOT DETECTED NOT DETECTED Final   Escherichia coli DETECTED (A) NOT DETECTED Final    Comment: CRITICAL RESULT CALLED TO, READ BACK BY AND VERIFIED WITH: JASON ROBBINS 01/02/2022 AT 0406 SRR    Klebsiella aerogenes DETECTED (A) NOT DETECTED Final    Comment: CRITICAL RESULT CALLED TO, READ BACK BY AND VERIFIED WITH: JASON ROBBINS 01/02/2022 AT 0406 SRR    Klebsiella oxytoca NOT DETECTED NOT DETECTED Final   Klebsiella pneumoniae NOT DETECTED NOT DETECTED Final   Proteus species NOT DETECTED NOT DETECTED Final   Salmonella species NOT DETECTED NOT DETECTED Final   Serratia marcescens NOT DETECTED NOT DETECTED Final   Haemophilus influenzae NOT DETECTED NOT DETECTED Final   Neisseria meningitidis NOT DETECTED NOT DETECTED Final   Pseudomonas aeruginosa NOT DETECTED NOT DETECTED Final   Stenotrophomonas maltophilia NOT DETECTED NOT DETECTED Final    Candida albicans NOT DETECTED NOT DETECTED Final   Candida auris NOT DETECTED NOT DETECTED Final   Candida glabrata NOT DETECTED NOT DETECTED Final   Candida krusei NOT DETECTED NOT DETECTED Final   Candida parapsilosis NOT DETECTED NOT DETECTED Final   Candida tropicalis NOT DETECTED NOT DETECTED Final   Cryptococcus neoformans/gattii NOT DETECTED NOT DETECTED Final   CTX-M ESBL NOT DETECTED NOT DETECTED Final   Carbapenem resistance IMP NOT DETECTED NOT DETECTED Final   Carbapenem resistance KPC NOT DETECTED NOT DETECTED Final   Carbapenem resistance  NDM NOT DETECTED NOT DETECTED Final   Carbapenem resist OXA 48 LIKE NOT DETECTED NOT DETECTED Final   Carbapenem resistance VIM NOT DETECTED NOT DETECTED Final    Comment: Performed at [facility], [address] [place], [place] [address]  Resp Panel by RT-PCR (Flu A&B, Covid) Anterior Nasal Swab     Status: None   Collection Time: 01/01/22  8:15 PM   Specimen: Anterior Nasal Swab  Result Value Ref Range Status   SARS Coronavirus 2 by RT PCR NEGATIVE NEGATIVE Final    Comment: (NOTE) SARS-CoV-2 target nucleic acids are NOT DETECTED.  The SARS-CoV-2 RNA is generally detectable in upper respiratory specimens during the acute phase of infection. The lowest concentration of SARS-CoV-2 viral copies this assay can detect is 138 copies/mL. A negative result does not preclude SARS-Cov-2 infection and should not be used as the sole basis for treatment or other patient management decisions. A negative result may occur with  improper specimen collection/handling, submission of specimen other than nasopharyngeal swab, presence of viral mutation(s) within the areas targeted by this assay, and inadequate number of viral copies(<138 copies/mL). A negative result must be combined with clinical observations, patient history, and epidemiological information. The expected result is Negative.  Fact Sheet for Patients:   EntrepreneurPulse.com.au  Fact Sheet for Healthcare Providers:  IncredibleEmployment.be  This test is no t yet approved or cleared by the [place] FDA and  has been authorized for detection and/or diagnosis of SARS-CoV-2 by FDA under an Emergency Use Authorization (EUA). This EUA will remain  in effect (meaning this test can be used) for the duration of the COVID-19 declaration under Section 564(b)(1) of the Act, 21 U.S.C.section 360bbb-3(b)(1), unless the authorization is terminated  or revoked sooner.       Influenza A by PCR NEGATIVE NEGATIVE Final   Influenza B by PCR NEGATIVE NEGATIVE Final    Comment: (NOTE) The Xpert Xpress SARS-CoV-2/FLU/RSV plus assay is intended as an aid in the diagnosis of influenza from Nasopharyngeal swab specimens and should not be used as a sole basis for treatment. Nasal washings and aspirates are unacceptable for Xpert Xpress SARS-CoV-2/FLU/RSV testing.  Fact Sheet for Patients: EntrepreneurPulse.com.au  Fact Sheet for Healthcare Providers: IncredibleEmployment.be  This test is not yet approved or cleared by the [place] FDA and has been authorized for detection and/or diagnosis of SARS-CoV-2 by FDA under an Emergency Use Authorization (EUA). This EUA will remain in effect (meaning this test can be used) for the duration of the COVID-19 declaration under Section 564(b)(1) of the Act, 21 U.S.C. section 360bbb-3(b)(1), unless the authorization is terminated or revoked.  Performed at [facility], [address], [place], [place] [address]   Surgical pcr screen     Status: Abnormal   Collection Time: 01/06/22 12:00 PM   Specimen: Nasal Mucosa; Nasal Swab  Result Value Ref Range Status   MRSA, PCR NEGATIVE NEGATIVE Final   Staphylococcus aureus POSITIVE (A) NEGATIVE Final    Comment: (NOTE) The Xpert SA Assay (FDA approved for NASAL specimens in  patients 79 years of age and older), is one component of a comprehensive surveillance program. It is not intended to diagnose infection nor to guide or monitor treatment. Performed at [facility], [address] [place],  [address]     Labs: CBC: Recent Labs  Lab 01/05/22 0414 01/06/22 0410 01/07/22 0605 01/08/22 0621 01/09/22 0448  WBC 13.8* 13.8* 16.3* 16.8* 17.2*  HGB 11.0* 10.6* 11.1* 10.5* 9.8*  HCT 32.4* 32.0* 33.5* 32.5* 30.2*  MCV 81.8 82.9 82.9 83.1 85.1  PLT 102* 104* 113* 123* 608*   Basic Metabolic Panel: Recent Labs  Lab 01/04/22 0413 01/05/22 0414 01/06/22 0410 01/07/22 0605 01/09/22 0448  NA 130* 136 135 132* 135  K 4.0 4.3 4.6 4.4 4.3  CL 100 104 100 100 107  CO2 _0 GLUCOSE 177* 178* 188* 214* 178*  BUN 31* 26* 30* 25* 21  CREATININE 1.22 1.14 1.28* 0.99 1.12  CALCIUM 8.7* 9.3 9.5 8.7* 8.0*  MG  --  1.8 1.8 1.9 1.8  PHOS  --  3.4 3.5 4.4 2.3*   Liver Function Tests: Recent Labs  Lab 01/03/22 0406 01/04/22 0413  AST 72* 61*  ALT 26 30  ALKPHOS 87 126  BILITOT 0.8 0.8  PROT 6.6 7.2  ALBUMIN 2.8* 2.9*   CBG: Recent Labs  Lab 01/08/22 1152 01/08/22 1554 01/08/22 1917 01/08/22 2255 01/09/22 0822  GLUCAP 242* 174* 246* 169* 186*    Discharge time spent: greater than 30 minutes.  Signed: Sharen Hones, MD [facility] 01/09/2022

## 2022-01-10 ENCOUNTER — Other Ambulatory Visit: Payer: Self-pay | Admitting: Anatomic Pathology & Clinical Pathology

## 2022-01-10 LAB — SURGICAL PATHOLOGY

## 2022-01-15 ENCOUNTER — Ambulatory Visit: Payer: Medicare Other | Admitting: Oncology

## 2022-01-15 ENCOUNTER — Telehealth: Payer: Self-pay | Admitting: *Deleted

## 2022-01-15 ENCOUNTER — Telehealth: Payer: Self-pay | Admitting: Cardiovascular Disease

## 2022-01-15 NOTE — Telephone Encounter (Signed)
Called Dr. Rockey Situ office and asked for clearance letter for the pt to have a liver bx and pt is on plavis 75 mg and ASA 81 mg. I spoke to sylvia and she took the info about it and will let us know

## 2022-01-15 NOTE — Telephone Encounter (Signed)
   Name: Randy Frazier  DOB: 1927/07/05  MRN: [id number]  Primary Cardiologist: Ida Rogue, MD  Chart reviewed as part of pre-operative protocol coverage. Because of [name]'s past medical history and time since last visit, he will require a follow-up in-office visit in order to better assess preoperative cardiovascular risk.Pt has not been seen since 2021.   Pre-op covering staff: - Please schedule appointment and call patient to inform them. If patient already had an upcoming appointment within acceptable timeframe, please add "pre-op clearance" to the appointment notes so provider is aware. - Please contact requesting surgeon's office via preferred method (i.e, phone, fax) to inform them of need for appointment prior to surgery.  This message will also be routed to  Dr Rockey Situ for input on holding Aspirin and Plavix as requested below so that this information is available to the clearing provider at time of patient's appointment.   Lenna Sciara, NP  01/15/2022, 3:13 PM

## 2022-01-15 NOTE — Telephone Encounter (Signed)
   Pre-operative Risk Assessment    Patient Name: Randy Frazier  DOB: 09-30-1927 MRN: [id number]      Request for Surgical Clearance    Procedure:   Liver Biopsy  Date of Surgery:  Pending                                  Surgeon: Dr Janese Banks Surgeon's Group or Practice Name:   Phone number:  [phone] Fax number:  [phone]   Type of Clearance Requested:   - Pharmacy:  Hold Aspirin and Clopidogrel (Plavix)     Type of Anesthesia:   Twilight Sedation   Additional requests/questions:    [name]   01/15/2022, 2:33 PM

## 2022-01-16 ENCOUNTER — Other Ambulatory Visit: Payer: Medicare Other

## 2022-01-16 NOTE — Progress Notes (Deleted)
The proposed treatment discussed in conference is for discussion purpose only and is not a binding recommendation. The patient has not been physically examined or presented with their treatment options. Therefore, final treatment plans cannot be decided.

## 2022-01-16 NOTE — Telephone Encounter (Signed)
Called and left a voice message for patient asking to give office a call back to get scheduled for an appointment for pre-op clearance

## 2022-01-17 NOTE — Telephone Encounter (Signed)
Will send FYI to the requesting office the pt will need an appt in office before he can be cleared by cardiology. We have left a message for the pt however he has not called back to make appt.

## 2022-01-17 NOTE — Telephone Encounter (Signed)
What is the clearance for? Patient already had his liver biopsy

## 2022-01-17 NOTE — Telephone Encounter (Signed)
Dr. Janese Banks, the clearance was from your office for cardiac clearance for the liver Bx. I apologize for any inconvenience. I will make note that the liver Bx was has been done and clearance is no longer needed. Could be that the pt themselves called [place] and stated about the procedure thinking they may need to be cleared by their cardiologist. I will note clearance is not need as procedure has been done.

## 2022-01-17 NOTE — Telephone Encounter (Signed)
Will send a message to the [facility] sched team to see if they may be able to help schedule an appt for pre op clearance.

## 2022-01-21 ENCOUNTER — Encounter: Payer: Self-pay | Admitting: Physician Assistant

## 2022-01-21 ENCOUNTER — Ambulatory Visit (INDEPENDENT_AMBULATORY_CARE_PROVIDER_SITE_OTHER): Payer: Medicare Other | Admitting: Physician Assistant

## 2022-01-21 VITALS — BP 162/87 | HR 87 | Temp 98.0°F | Ht 70.0 in | Wt 169.0 lb

## 2022-01-21 DIAGNOSIS — K8010 Calculus of gallbladder with chronic cholecystitis without obstruction: Secondary | ICD-10-CM

## 2022-01-21 DIAGNOSIS — Z09 Encounter for follow-up examination after completed treatment for conditions other than malignant neoplasm: Secondary | ICD-10-CM

## 2022-01-21 MED ORDER — TRAZODONE HCL 50 MG PO TABS: 50.0000 mg | ORAL_TABLET | Freq: Every day | ORAL | 0 refills | Status: DC

## 2022-01-21 NOTE — Patient Instructions (Addendum)
You  may shower as usual.  You can remove the dressing in 2 days. Pick up your medication at the pharmacy. Please call the office if you have any questions or concerns.   GENERAL POST-OPERATIVE PATIENT INSTRUCTIONS   WOUND CARE INSTRUCTIONS:  Keep a dry clean dressing on the wound if there is drainage. The initial bandage may be removed after 24 hours.  Once the wound has quit draining you may leave it open to air.  If clothing rubs against the wound or causes irritation and the wound is not draining you may cover it with a dry dressing during the daytime.  Try to keep the wound dry and avoid ointments on the wound unless directed to do so.  If the wound becomes bright red and painful or starts to drain infected material that is not clear, please contact your physician immediately.  If the wound is mildly pink and has a thick firm ridge underneath it, this is normal, and is referred to as a healing ridge.  This will resolve over the next 4-6 weeks.  BATHING: You may shower if you have been informed of this by your surgeon. However, Please do not submerge in a tub, hot tub, or pool until incisions are completely sealed or have been told by your surgeon that you may do so.  DIET:  You may eat any foods that you can tolerate.  It is a good idea to eat a high fiber diet and take in plenty of fluids to prevent constipation.  If you do become constipated you may want to take a mild laxative or take ducolax tablets on a daily basis until your bowel habits are regular.  Constipation can be very uncomfortable, along with straining, after recent surgery.  ACTIVITY:  You are encouraged to cough and deep breath or use your incentive spirometer if you were given one, every 15-30 minutes when awake.  This will help prevent respiratory complications and low grade fevers post-operatively if you had a general anesthetic.  You may want to hug a pillow when coughing and sneezing to add additional support to the surgical  area, if you had abdominal or chest surgery, which will decrease pain during these times.  You are encouraged to walk and engage in light activity for the next two weeks.  You should not lift more than 20 pounds for 6 weeks total after surgery as it could put you at increased risk for complications.  Twenty pounds is roughly equivalent to a plastic bag of groceries. At that time- Listen to your body when lifting, if you have pain when lifting, stop and then try again in a few days. Soreness after doing exercises or activities of daily living is normal as you get back in to your normal routine.  MEDICATIONS:  Try to take narcotic medications and anti-inflammatory medications, such as tylenol, ibuprofen, naprosyn, etc., with food.  This will minimize stomach upset from the medication.  Should you develop nausea and vomiting from the pain medication, or develop a rash, please discontinue the medication and contact your physician.  You should not drive, make important decisions, or operate machinery when taking narcotic pain medication.  SUNBLOCK Use sun block to incision area over the next year if this area will be exposed to sun. This helps decrease scarring and will allow you avoid a permanent darkened area over your incision.  QUESTIONS:  Please feel free to call our office if you have any questions, and we will be glad  to assist you. [phone]

## 2022-01-21 NOTE — Progress Notes (Signed)
[facility] POST-OP OFFICE VISIT  01/21/2022  HPI: Randy Frazier is a 88 y.o. male 15 days s/p robotic assisted laparoscopic cholecystectomy and liver biopsy   He reports he has done fairly well since surgery especially given age and circumstances No complaints of pain; fever, chills, nausea, emesis Did have thrush started on Nystatin by PCP Drain serous; <5 mls daily Again, pathology did show [facility] Has oncology follow up on 12/15 He does endorse significant anxiety about the malignancy and his pending follow up; interfering with sleep; tried tylenol PM and melatonin without improvement.   Vital signs: BP (!) 162/87   Pulse 87   Temp 98 F (36.7 C)   Ht '5\' 10"'$  (1.778 m)   Wt 169 lb (76.7 kg)   SpO2 100%   BMI 24.25 kg/m    Physical Exam: Constitutional: Well appearing male, NAD Abdomen: Soft, non-tender, non-distended, no rebound/guarding. Surgical drian in left lateral port; output minimal and serous (Removed) Skin: Laparoscopic incisions are healing well, no erythema or drainage   Assessment/Plan: This is a 88 y.o. male 15 days s/p robotic assisted laparoscopic cholecystectomy and liver biopsy    - I will send him short Rx for Trazodone QHS  - Drain removed; dressing placed  - Pain control prn  - Reviewed wound care recommendation  - Reviewed lifting restrictions; 4 weeks total  - Reviewed surgical pathology; [place] from [place]; liver biopsy with [facility]  - Follow up with oncology on 12/15  - He can follow up on as needed basis with [place]. We will remain available as needed. He, and his son, understand to call with questions/concerns  -- Edison Simon, PA-C [facility] 01/21/2022, 1:50 PM M-F: 7am - 4pm

## 2022-01-22 ENCOUNTER — Telehealth: Payer: Self-pay | Admitting: Cardiovascular Disease

## 2022-01-22 NOTE — Telephone Encounter (Signed)
LVM to schedule, please schedule.

## 2022-01-23 ENCOUNTER — Telehealth: Payer: Self-pay | Admitting: Physician Assistant

## 2022-01-23 ENCOUNTER — Other Ambulatory Visit: Payer: Self-pay

## 2022-01-23 MED ORDER — TRAZODONE HCL 50 MG PO TABS: 50.0000 mg | ORAL_TABLET | Freq: Every day | ORAL | 0 refills | Status: DC

## 2022-01-23 NOTE — Telephone Encounter (Signed)
Patient was in yesterday for follow up with Zach.  Patient was suppose to be prescribed Trazodone as he needed something to help him rest.  They contacted the pharmacy this morning and that pharmacy does not have a script for this Rx. They use [facility] [phone].  Please call son once this has been send it. Thank you.

## 2022-01-23 NOTE — Telephone Encounter (Signed)
Medication changed to [place]. Patient notified.

## 2022-01-24 ENCOUNTER — Inpatient Hospital Stay: Payer: Medicare Other | Attending: Oncology | Admitting: Oncology

## 2022-01-24 ENCOUNTER — Encounter: Payer: Self-pay | Admitting: Oncology

## 2022-01-24 VITALS — BP 145/76 | HR 68 | Temp 99.5°F | Wt 164.0 lb

## 2022-01-24 DIAGNOSIS — I10 Essential (primary) hypertension: Secondary | ICD-10-CM | POA: Diagnosis not present

## 2022-01-24 DIAGNOSIS — R160 Hepatomegaly, not elsewhere classified: Secondary | ICD-10-CM

## 2022-01-24 DIAGNOSIS — G479 Sleep disorder, unspecified: Secondary | ICD-10-CM | POA: Insufficient documentation

## 2022-01-24 DIAGNOSIS — R7989 Other specified abnormal findings of blood chemistry: Secondary | ICD-10-CM | POA: Insufficient documentation

## 2022-01-24 DIAGNOSIS — E119 Type 2 diabetes mellitus without complications: Secondary | ICD-10-CM | POA: Diagnosis not present

## 2022-01-24 DIAGNOSIS — C220 Liver cell carcinoma: Secondary | ICD-10-CM | POA: Insufficient documentation

## 2022-01-24 DIAGNOSIS — K7460 Unspecified cirrhosis of liver: Secondary | ICD-10-CM | POA: Diagnosis not present

## 2022-01-24 DIAGNOSIS — K7469 Other cirrhosis of liver: Secondary | ICD-10-CM

## 2022-01-24 DIAGNOSIS — Z85828 Personal history of other malignant neoplasm of skin: Secondary | ICD-10-CM | POA: Insufficient documentation

## 2022-01-25 DIAGNOSIS — C220 Liver cell carcinoma: Secondary | ICD-10-CM | POA: Insufficient documentation

## 2022-01-25 NOTE — Progress Notes (Signed)
Hematology/Oncology Consult note [facility]  [phone] [phone]  Patient Care Team: Pcp, No as PCP - General Rockey Situ Kathlene November, MD as PCP - Cardiology (Cardiology) Minna Merritts, MD as Consulting Physician (Cardiology) Clent Jacks, RN as Oncology Nurse Navigator   Name of the patient: Randy Frazier  [id number]  09/28/1927   Date of visit: 01/25/22  Diagnosis-stage IIIa multifocal hepatocellular carcinoma  Chief complaint/ Reason for visit-discuss pathology results and further management  Heme/Onc history: Patient is a 88 year old male who was seen by GI after he had a CT abdomen in October 2023 for abdominal pain.  CT showed enhancing lesion in the left hepatic lobe measuring 3.6 x 2.5 cm.  This was followed by an MRI abdomen with and without contrast which shows a dominant mass measuring 6.1 x 3.8 x 3.5 cm in the segment 2 of the liver.  Multiple other foci of hyperenhancement up to 1.3 cm which were categorized as LI-RADS 4.  Other lesions which were small but are also suspicious for multifocal hepatocellular carcinoma LI-RADS 3.  The left portal vein was likely involved with the dominant high left hepatic lobe tumor.  Patient referred to oncology for further management.[name]  He was found to have changes of cirrhosis as well on the scan although the cause of cirrhosis is presently unclear.  He has undergone some work-up by GI including hepatitis testing which was negative.  Iron studies are normal.  He is a lifelong teetotaler.    Interval history- ***  ECOG PS- *** Pain scale- *** Opioid associated constipation- ***  Review of systems- ROS    Allergies  Allergen Reactions  . Ibuprofen Swelling  . Penicillins Swelling and Hives    lips Other reaction(s): Other (See Comments) Edema (lips)     Past Medical History:  Diagnosis Date  . Actinic keratosis   . Arrhythmia    A-Fib  . BPH (benign prostatic  hyperplasia)   . Coronary artery disease 02/11/2007   CABG X 3  @ [facility]  . CVA (cerebral infarction) 02/11/2012  . Diabetes mellitus without complication ([place])   . Enlarged prostate   . GERD (gastroesophageal reflux disease)   . Hyperlipidemia   . Hypertension   . Kidney stone   . Renal disorder   . Skin cancer    scalp, tx by Dr Sharlett Iles 2000s?     Past Surgical History:  Procedure Laterality Date  . cataract surgery Bilateral   . CORONARY ARTERY BYPASS GRAFT  2009   CABG X 3 @ [facility]  . LIVER BIOPSY  01/06/2022   Procedure: LIVER BIOPSY;  Surgeon: Ronny Bacon, MD;  Location: [facility] ORS;  Service: General;;  . TEAR DUCT PROBING Right 05/07/2016   Procedure: Evacuation of hematoma, right peri-orbital bleed.;  Surgeon: Clista Bernhardt, MD;  Location: [facility];  Service: Ophthalmology;  Laterality: Right;    Social History   Socioeconomic History  . Marital status: Widowed    Spouse name: Not on file  . Number of children: Not on file  . Years of education: Not on file  . Highest education level: Not on file  Occupational History  . Not on file  Tobacco Use  . Smoking status: Never  . Smokeless tobacco: Never  Vaping Use  . Vaping Use: Never used  Substance and Sexual Activity  . Alcohol use: No    Comment: occasional  . Drug use: No  . Sexual activity: Not Currently  Other Topics Concern  . Not on file  Social History Narrative  . Not on file   Social Determinants of Health   Financial Resource Strain: Not on file  Food Insecurity: No Food Insecurity (01/02/2022)   Hunger Vital Sign   . Worried About Charity fundraiser in the Last Year: Never true   . Ran Out of Food in the Last Year: Never true  Transportation Needs: No Transportation Needs (01/02/2022)   PRAPARE - Transportation   . Lack of Transportation (Medical): No   . Lack of Transportation (Non-Medical): No  Physical Activity: Not on file  Stress: Not on file  Social Connections: Not on file   Intimate Partner Violence: Not At Risk (01/02/2022)   Humiliation, Afraid, Rape, and Kick questionnaire   . Fear of Current or Ex-Partner: No   . Emotionally Abused: No   . Physically Abused: No   . Sexually Abused: No    Family History  Family history unknown: Yes     Current Outpatient Medications:  .  amLODipine (NORVASC) 10 MG tablet, TAKE 1 TABLET BY MOUTH EVERY DAY, Disp: 30 tablet, Rfl: 6 .  aspirin EC 81 MG tablet, Take 81 mg by mouth., Disp: , Rfl:  .  atorvastatin (LIPITOR) 10 MG tablet, Take 1 tablet (10 mg total) by mouth every other day., Disp: 90 tablet, Rfl: 1 .  clopidogrel (PLAVIX) 75 MG tablet, TAKE 1 TABLET(75 MG) BY MOUTH DAILY, Disp: 90 tablet, Rfl: 0 .  ezetimibe (ZETIA) 10 MG tablet, Take 10 mg by mouth daily., Disp: , Rfl:  .  FLUoxetine (PROZAC) 20 MG capsule, Take 20 mg by mouth daily., Disp: , Rfl:  .  gabapentin (NEURONTIN) 300 MG capsule, Take 300 mg by mouth 3 (three) times daily. , Disp: , Rfl:  .  lisinopril (PRINIVIL,ZESTRIL) 20 MG tablet, Take 20 mg by mouth daily. , Disp: , Rfl:  .  Magnesium 500 MG TABS, Take 500 mg by mouth daily., Disp: , Rfl:  .  metoprolol tartrate (LOPRESSOR) 25 MG tablet, Take 25 mg by mouth 2 (two) times daily., Disp: , Rfl:  .  nystatin (MYCOSTATIN) 100000 UNIT/ML suspension, Take 5 mLs by mouth 4 (four) times daily., Disp: , Rfl:  .  oxyCODONE (OXY IR/ROXICODONE) 5 MG immediate release tablet, Take 1 tablet (5 mg total) by mouth every 4 (four) hours as needed for moderate pain., Disp: 12 tablet, Rfl: 0 .  pantoprazole (PROTONIX) 40 MG tablet, Take 1 tablet (40 mg total) by mouth daily., Disp: 30 tablet, Rfl: 0 .  traZODone (DESYREL) 50 MG tablet, Take 1 tablet (50 mg total) by mouth at bedtime., Disp: 5 tablet, Rfl: 0 .  insulin detemir (LEVEMIR) 100 UNIT/ML injection, Inject 0.16 mLs (16 Units total) into the skin daily. (Patient not taking: Reported on 01/24/2022), Disp: 10 mL, Rfl: 0  Physical exam:  Vitals:   01/24/22  1308  BP: (!) 145/76  Pulse: 68  Temp: 99.5 F (37.5 C)  TempSrc: Tympanic  Weight: 164 lb (74.4 kg)   Physical Exam      Latest Ref Rng & Units 01/09/2022    4:48 AM  CMP  Glucose 70 - 99 mg/dL 178   BUN 8 - 23 mg/dL 21   Creatinine 0.61 - 1.24 mg/dL 1.12   Sodium 135 - 145 mmol/L 135   Potassium 3.5 - 5.1 mmol/L 4.3   Chloride 98 - 111 mmol/L 107   CO2 22 - 32 mmol/L 23  Calcium 8.9 - 10.3 mg/dL 8.0       Latest Ref Rng & Units 01/09/2022    4:48 AM  CBC  WBC 4.0 - 10.5 K/uL 17.2   Hemoglobin 13.0 - 17.0 g/dL 9.8   Hematocrit 39.0 - 52.0 % 30.2   Platelets 150 - 400 K/uL 135     No images are attached to the encounter.  NM Hepato W/EF  Result Date: 01/04/2022 CLINICAL DATA:  Abdominal pain.  Evaluate gallbladder motility. EXAM: NUCLEAR MEDICINE HEPATOBILIARY IMAGING WITH GALLBLADDER EF TECHNIQUE: Sequential images of the abdomen were obtained out to 60 minutes following intravenous administration of radiopharmaceutical. After oral ingestion of Ensure, gallbladder ejection fraction was determined. At 60 min, normal ejection fraction is greater than 33%. RADIOPHARMACEUTICALS:  5.13 mCi Tc-58m Choletec IV COMPARISON:  None Available. FINDINGS: Prompt uptake and biliary excretion of activity by the liver is seen. Gallbladder activity is visualized, consistent with patency of cystic duct. Biliary activity passes into small bowel, consistent with patent common bile duct. Calculated gallbladder ejection fraction is 16%. (Normal gallbladder ejection fraction with Ensure is greater than 33%.) IMPRESSION: 1. The gallbladder ejection fraction is abnormally low consistent with biliary dyskinesia. Electronically Signed   By: DDorise BullionIII M.D.   On: 01/04/2022 11:51   [place]domen Limited RUQ (LIVER/GB)  Result Date: 01/02/2022 CLINICAL DATA:  Systemic inflammatory response syndrome. Upper abdominal pain. EXAM: ULTRASOUND ABDOMEN LIMITED RIGHT UPPER QUADRANT COMPARISON:  MRI  12/19/2021 FINDINGS: Gallbladder: Stones are identified within the gallbladder as noted recent MRI. Tumefactive sludge is noted on today's study. No pericholecystic fluid or sonographic Murphy's sign. Mild gallbladder wall thickening measuring 3.3 mm. Common bile duct: Diameter: 4.5 mm Liver: The liver appears heterogeneous with a lobular contour concerning for cirrhosis. Scattered hypoechoic areas are noted concerning for multifocal liver low metastasis versus hepatomas. Portal vein is patent on color Doppler imaging with normal direction of blood flow towards the liver. Other: None. IMPRESSION: 1. Gallstones and gallbladder sludge. Mild gallbladder wall thickening. No pericholecystic fluid or sonographic Murphy's sign. 2. Cirrhosis of the liver. 3. Multiple hypoechoic liver lesions suspicious for either metastatic disease or multifocal hepatoma. Electronically Signed   By: TKerby MoorsM.D.   On: 01/02/2022 05:33   CT Angio Chest PE W and/or Wo Contrast  Result Date: 01/01/2022 CLINICAL DATA:  Chest pain and hypoxia.  Difficulty swallowing. EXAM: CT ANGIOGRAPHY CHEST WITH CONTRAST TECHNIQUE: Multidetector CT imaging of the chest was performed using the standard protocol during bolus administration of intravenous contrast. Multiplanar CT image reconstructions and MIPs were obtained to evaluate the vascular anatomy. RADIATION DOSE REDUCTION: This exam was performed according to the departmental dose-optimization program which includes automated exposure control, adjustment of the mA and/or kV according to patient size and/or use of iterative reconstruction technique. CONTRAST:  621mOMNIPAQUE IOHEXOL 350 MG/ML SOLN COMPARISON:  12/09/2021 FINDINGS: Cardiovascular: Good opacification of the central and segmental pulmonary arteries. No focal filling defects. No evidence of significant pulmonary embolus. Normal heart size. No pericardial effusions. Normal caliber thoracic aorta. No dissection. Calcification of  the aorta and coronary arteries. Postoperative changes consistent with coronary bypass. Mediastinum/Nodes: Thyroid gland is unremarkable. Prominent lymph nodes in the supraclavicular regions and mediastinum. Largest are subcarinal lymph nodes measuring about 12 mm short axis dimension. Appearances are similar to prior study. Nonspecific etiology, likely reactive. Lungs/Pleura: Atelectasis in the lung bases. No airspace disease or consolidation. No pleural effusions. No pneumothorax. Upper Abdomen: Nodular contour to the liver suggesting hepatic  cirrhosis. Small esophageal hiatal hernia. Scattered upper abdominal lymph nodes without pathologic enlargement. Musculoskeletal: Degenerative changes. No destructive bone lesions. Sternotomy wires. Review of the MIP images confirms the above findings. IMPRESSION: 1. No evidence of significant pulmonary embolus. 2. Aortic atherosclerosis. 3. No evidence of active pulmonary disease. 4. Nonspecific mediastinal lymphadenopathy. No change since prior study. 5. Probable hepatic cirrhosis. Electronically Signed   By: Lucienne Capers M.D.   On: 01/01/2022 22:05   DG Chest Port 1 View  Result Date: 01/01/2022 CLINICAL DATA:  Chest pain EXAM: PORTABLE CHEST 1 VIEW COMPARISON:  12/08/2021 FINDINGS: Prior CABG. Heart and mediastinal contours are within normal limits. No focal opacities or effusions. No acute bony abnormality. IMPRESSION: No active cardiopulmonary disease. Electronically Signed   By: Rolm Baptise M.D.   On: 01/01/2022 21:04     Assessment and plan- Patient is a 88 y.o. male ***   Visit Diagnosis 1. Other cirrhosis of liver ([place])   2. Liver mass      Dr. Randa Evens, MD, MPH [facility] [phone] 01/25/2022 12:59 PM

## 2022-01-26 ENCOUNTER — Encounter: Payer: Self-pay | Admitting: Oncology

## 2022-01-26 MED ORDER — LIDOCAINE-PRILOCAINE 2.5-2.5 % EX CREA: TOPICAL_CREAM | CUTANEOUS | 3 refills | Status: DC

## 2022-01-26 MED ORDER — ONDANSETRON HCL 8 MG PO TABS: 8.0000 mg | ORAL_TABLET | Freq: Three times a day (TID) | ORAL | 1 refills | Status: DC | PRN

## 2022-01-26 MED ORDER — PROCHLORPERAZINE MALEATE 10 MG PO TABS: 10.0000 mg | ORAL_TABLET | Freq: Four times a day (QID) | ORAL | 1 refills | Status: DC | PRN

## 2022-01-26 NOTE — Progress Notes (Signed)
START OFF PATHWAY REGIMEN - Other   [id number]:Atezolizumab 1,200 mg IV D1 q21 Days:   A cycle is every 21 days:     Atezolizumab   **Always confirm dose/schedule in your pharmacy ordering system**  Patient Characteristics: Intent of Therapy: Non-Curative / Palliative Intent, Discussed with Patient

## 2022-01-27 ENCOUNTER — Emergency Department
Admission: EM | Admit: 2022-01-27 | Discharge: 2022-01-28 | Disposition: A | Discharge status: Home / Self Care | Payer: Medicare Other | Attending: Emergency Medicine | Admitting: Emergency Medicine

## 2022-01-27 ENCOUNTER — Emergency Department: Payer: Medicare Other

## 2022-01-27 ENCOUNTER — Other Ambulatory Visit: Payer: Self-pay

## 2022-01-27 ENCOUNTER — Telehealth: Payer: Self-pay | Admitting: Oncology

## 2022-01-27 DIAGNOSIS — R197 Diarrhea, unspecified: Secondary | ICD-10-CM | POA: Insufficient documentation

## 2022-01-27 DIAGNOSIS — K7460 Unspecified cirrhosis of liver: Secondary | ICD-10-CM | POA: Insufficient documentation

## 2022-01-27 DIAGNOSIS — Z8673 Personal history of transient ischemic attack (TIA), and cerebral infarction without residual deficits: Secondary | ICD-10-CM | POA: Diagnosis not present

## 2022-01-27 DIAGNOSIS — E119 Type 2 diabetes mellitus without complications: Secondary | ICD-10-CM | POA: Diagnosis not present

## 2022-01-27 DIAGNOSIS — I700 Atherosclerosis of aorta: Secondary | ICD-10-CM | POA: Diagnosis not present

## 2022-01-27 DIAGNOSIS — I10 Essential (primary) hypertension: Secondary | ICD-10-CM | POA: Insufficient documentation

## 2022-01-27 DIAGNOSIS — Z8679 Personal history of other diseases of the circulatory system: Secondary | ICD-10-CM | POA: Diagnosis not present

## 2022-01-27 DIAGNOSIS — D72829 Elevated white blood cell count, unspecified: Secondary | ICD-10-CM | POA: Insufficient documentation

## 2022-01-27 LAB — CBC
HCT: 31.1 % — ABNORMAL LOW (ref 39.0–52.0)
Hemoglobin: 9.8 g/dL — ABNORMAL LOW (ref 13.0–17.0)
MCH: 27.1 pg (ref 26.0–34.0)
MCHC: 31.5 g/dL (ref 30.0–36.0)
MCV: 85.9 fL (ref 80.0–100.0)
Platelets: 188 10*3/uL (ref 150–400)
RBC: 3.62 MIL/uL — ABNORMAL LOW (ref 4.22–5.81)
RDW: 18.6 % — ABNORMAL HIGH (ref 11.5–15.5)
WBC: 18.2 10*3/uL — ABNORMAL HIGH (ref 4.0–10.5)
nRBC: 0 % (ref 0.0–0.2)

## 2022-01-27 LAB — COMPREHENSIVE METABOLIC PANEL
ALT: 21 U/L (ref 0–44)
AST: 37 U/L (ref 15–41)
Albumin: 3.2 g/dL — ABNORMAL LOW (ref 3.5–5.0)
Alkaline Phosphatase: 170 U/L — ABNORMAL HIGH (ref 38–126)
Anion gap: 7 (ref 5–15)
BUN: 16 mg/dL (ref 8–23)
CO2: 27 mmol/L (ref 22–32)
Calcium: 8.1 mg/dL — ABNORMAL LOW (ref 8.9–10.3)
Chloride: 101 mmol/L (ref 98–111)
Creatinine, Ser: 1.34 mg/dL — ABNORMAL HIGH (ref 0.61–1.24)
GFR, Estimated: 50 mL/min — ABNORMAL LOW (ref >60)
Glucose, Bld: 164 mg/dL — ABNORMAL HIGH (ref 70–99)
Potassium: 4.4 mmol/L (ref 3.5–5.1)
Sodium: 135 mmol/L (ref 135–145)
Total Bilirubin: 0.5 mg/dL (ref 0.3–1.2)
Total Protein: 7.7 g/dL (ref 6.5–8.1)

## 2022-01-27 LAB — LIPASE, BLOOD: Lipase: 51 U/L (ref 11–51)

## 2022-01-27 MED ORDER — SODIUM CHLORIDE 0.9 % IV BOLUS
1000.0000 mL | Freq: Once | INTRAVENOUS | Status: AC
  Administered 2022-01-28: 1000 mL via INTRAVENOUS

## 2022-01-27 MED ORDER — ONDANSETRON HCL 4 MG/2ML IJ SOLN
4.0000 mg | Freq: Once | INTRAMUSCULAR | Status: AC
  Administered 2022-01-28: 4 mg via INTRAVENOUS
  Filled 2022-01-27: qty 2

## 2022-01-27 NOTE — ED Triage Notes (Signed)
Pt presents to the ED via POV due to diarrhea. Pt states he had his gallbladder removed 3 weeks and diagnosed with liver cx. Pt states he is taking nystatin for thrush in the mouth. Pt states he had three loose stools this morning. Pt A&Ox4

## 2022-01-27 NOTE — Telephone Encounter (Signed)
Left VM with patient to make him aware of chemo start date of 12/20.

## 2022-01-27 NOTE — ED Provider Triage Note (Signed)
Emergency Medicine Provider Triage Evaluation Note  Randy Frazier , a 88 y.o. male  was evaluated in triage.  Pt complains of diarrhea. He had a cholecystectomy 3 weeks ago and was diagnosed with liver cancer as well. No dark tarry stool or abdominal pain.   Physical Exam  BP (!) 145/87 (BP Location: Right Arm)   Pulse 87   Temp 98.5 F (36.9 C) (Oral)   Resp 18   SpO2 97%  Gen:   Awake, no distress   Resp:  Normal effort  MSK:   Moves extremities without difficulty  Other:    Medical Decision Making  Medically screening exam initiated at 2:53 PM.  Appropriate orders placed.  [name] was informed that the remainder of the evaluation will be completed by another provider, this initial triage assessment does not replace that evaluation, and the importance of remaining in the ED until their evaluation is complete.    Victorino Dike, FNP 01/27/22 1454

## 2022-01-27 NOTE — ED Provider Notes (Signed)
[facility] Provider Note    Event Date/Time   First MD Initiated Contact with Patient 01/27/22 2314     (approximate)  History   Chief Complaint: Diarrhea  HPI  Randy Frazier is a 88 y.o. male with a past medical history of CAD, CVA, diabetes, gastric reflux, hypertension, hyperlipidemia, recently removed gallbladder approximately 2 to 3 weeks ago who presents to the emergency department for diarrhea.  According to the patient since he had his gallbladder removed he has been experiencing significant amounts of diarrhea.  States he is lost approximately 20 pounds.  States he feels very dehydrated.  Patient states while he was in the hospital he was also diagnosed with liver cancer and is scheduled to start treatment next week at the cancer center.  Patient states nausea, he also was recently diagnosed with thrush and is currently taking nystatin.  States he is eating and drinking minimal due to the discomfort of the thrush and frequent episodes of diarrhea anytime he eats or drinks.  Denies any abdominal pain.  No fever.  Physical Exam   Triage Vital Signs: ED Triage Vitals  Enc Vitals Group     BP 01/27/22 1450 (!) 145/87     Pulse Rate 01/27/22 1450 87     Resp 01/27/22 1450 18     Temp 01/27/22 1450 98.5 F (36.9 C)     Temp Source 01/27/22 1450 Oral     SpO2 01/27/22 1450 97 %     Weight --      Height --      Head Circumference --      Peak Flow --      Pain Score 01/27/22 1451 0     Pain Loc --      Pain Edu? --      Excl. in [place]? --     Most recent vital signs: Vitals:   01/27/22 1450  BP: (!) 145/87  Pulse: 87  Resp: 18  Temp: 98.5 F (36.9 C)  SpO2: 97%    General: Awake, no distress.  CV:  Good peripheral perfusion.  Regular rate and rhythm  Resp:  Normal effort.  Equal breath sounds bilaterally.  Abd:  No distention.  Soft, nontender.  No rebound or guarding.  ED Results / Procedures / Treatments   RADIOLOGY  I reviewed  and interpreted the CT images.  Patient appears to have several large cysts within the abdomen but I do not see any obstructive process.  Radiology says no acute process, constipation unchanged hepatic lesions/masses.   MEDICATIONS ORDERED IN ED: Medications  sodium chloride 0.9 % bolus 1,000 mL (has no administration in time range)  ondansetron (ZOFRAN) injection 4 mg (has no administration in time range)     IMPRESSION / MDM / ASSESSMENT AND PLAN / ED COURSE  I reviewed the triage vital signs and the nursing notes.  Patient's presentation is most consistent with acute presentation with potential threat to life or bodily function.  Patient presents emergency department for diarrhea.  States he feels dehydrated so he came to the emergency department today.  States he has been over 2 weeks of daily diarrhea.  States at least 3 episodes today.  States he essentially has diarrhea every time he eats or drinks.  He is also experiencing thrush currently on nystatin and has been experiencing discomfort with swallowing as well which has hindered his intake.  Patient is lab work shows leukocytosis with a white blood cell count  of 18,000 however this is largely unchanged from historical values.  In looking at the patient's chemistry reassuringly his anion gap is 7, mild renal insufficiency compared to several weeks ago but largely unchanged from values prior to that.  Lipase normal.  Given the patient's multiple weeks of diarrhea along with leukocytosis we will obtain CT imaging of the abdomen to rule out colitis.  Will attempt to obtain a stool sample for C. difficile testing.  We will IV hydrate and treat with Zofran while awaiting CT results.  Patient agreeable to plan of care.  Patient CT scan shows no significant abnormality.  Lab work is reassuring.  Urinalysis reassuring with no ketones.  Patient states he is feeling much better after fluids.  Son is here with him as well.  States he is ready to go  home.  We will discharge the patient home have the patient follow-up with his doctor.  Patient agreeable to plan of care.  I discussed dietary precautions as the patient is recently status post cholecystectomy.  FINAL CLINICAL IMPRESSION(S) / ED DIAGNOSES   Diarrhea   Note:  This document was prepared using Dragon voice recognition software and may include unintentional dictation errors.   Harvest Dark, MD 01/28/22 [phone]

## 2022-01-28 ENCOUNTER — Inpatient Hospital Stay: Payer: Medicare Other

## 2022-01-28 ENCOUNTER — Other Ambulatory Visit: Payer: Self-pay

## 2022-01-28 DIAGNOSIS — R197 Diarrhea, unspecified: Secondary | ICD-10-CM | POA: Diagnosis not present

## 2022-01-28 LAB — URINALYSIS, ROUTINE W REFLEX MICROSCOPIC
Bilirubin Urine: NEGATIVE
Glucose, UA: NEGATIVE mg/dL
Hgb urine dipstick: NEGATIVE
Ketones, ur: NEGATIVE mg/dL
Leukocytes,Ua: NEGATIVE
Nitrite: NEGATIVE
Protein, ur: NEGATIVE mg/dL
Specific Gravity, Urine: 1.015 (ref 1.005–1.030)
pH: 5 (ref 5.0–8.0)

## 2022-01-29 ENCOUNTER — Inpatient Hospital Stay: Payer: Medicare Other

## 2022-02-05 ENCOUNTER — Telehealth: Payer: Self-pay | Admitting: Oncology

## 2022-02-05 NOTE — Telephone Encounter (Signed)
Patient would like to get back on the schedule to see Dr. Janese Banks he has been in and out of the hospital. He is scheduled for 1/10 for treatment but has not done any of the other appointments he was supposed to-endoscopy, chemo edu, port placement. Please advise on scheduling for him and his son would like to be called [phone]

## 2022-02-07 ENCOUNTER — Telehealth: Payer: Self-pay | Admitting: Cardiovascular Disease

## 2022-02-07 ENCOUNTER — Telehealth: Payer: Self-pay | Admitting: *Deleted

## 2022-02-07 ENCOUNTER — Other Ambulatory Visit: Payer: Self-pay

## 2022-02-07 ENCOUNTER — Encounter: Payer: Self-pay | Admitting: Medical Oncology

## 2022-02-07 ENCOUNTER — Inpatient Hospital Stay: Payer: Medicare Other

## 2022-02-07 ENCOUNTER — Inpatient Hospital Stay (HOSPITAL_BASED_OUTPATIENT_CLINIC_OR_DEPARTMENT_OTHER): Payer: Medicare Other | Admitting: Medical Oncology

## 2022-02-07 ENCOUNTER — Emergency Department
Admission: EM | Admit: 2022-02-07 | Discharge: 2022-02-07 | Discharge status: Against Medical Advice | Payer: Medicare Other | Attending: Emergency Medicine | Admitting: Emergency Medicine

## 2022-02-07 VITALS — BP 178/93 | HR 75 | Temp 98.2°F | Resp 16

## 2022-02-07 DIAGNOSIS — C220 Liver cell carcinoma: Secondary | ICD-10-CM | POA: Diagnosis not present

## 2022-02-07 DIAGNOSIS — R799 Abnormal finding of blood chemistry, unspecified: Secondary | ICD-10-CM | POA: Diagnosis present

## 2022-02-07 DIAGNOSIS — Z5321 Procedure and treatment not carried out due to patient leaving prior to being seen by health care provider: Secondary | ICD-10-CM | POA: Diagnosis not present

## 2022-02-07 DIAGNOSIS — R197 Diarrhea, unspecified: Secondary | ICD-10-CM

## 2022-02-07 DIAGNOSIS — G4700 Insomnia, unspecified: Secondary | ICD-10-CM | POA: Insufficient documentation

## 2022-02-07 DIAGNOSIS — G479 Sleep disorder, unspecified: Secondary | ICD-10-CM

## 2022-02-07 DIAGNOSIS — R7989 Other specified abnormal findings of blood chemistry: Secondary | ICD-10-CM

## 2022-02-07 DIAGNOSIS — R45851 Suicidal ideations: Secondary | ICD-10-CM

## 2022-02-07 LAB — CBC WITH DIFFERENTIAL/PLATELET
Abs Immature Granulocytes: 0.07 10*3/uL (ref 0.00–0.07)
Basophils Absolute: 0.1 10*3/uL (ref 0.0–0.1)
Basophils Relative: 1 %
Eosinophils Absolute: 0.8 10*3/uL — ABNORMAL HIGH (ref 0.0–0.5)
Eosinophils Relative: 5 %
HCT: 32 % — ABNORMAL LOW (ref 39.0–52.0)
Hemoglobin: 10.3 g/dL — ABNORMAL LOW (ref 13.0–17.0)
Immature Granulocytes: 0 %
Lymphocytes Relative: 51 %
Lymphs Abs: 8.2 10*3/uL — ABNORMAL HIGH (ref 0.7–4.0)
MCH: 26.8 pg (ref 26.0–34.0)
MCHC: 32.2 g/dL (ref 30.0–36.0)
MCV: 83.3 fL (ref 80.0–100.0)
Monocytes Absolute: 2.9 10*3/uL — ABNORMAL HIGH (ref 0.1–1.0)
Monocytes Relative: 18 %
Neutro Abs: 4 10*3/uL (ref 1.7–7.7)
Neutrophils Relative %: 25 %
Platelets: 194 10*3/uL (ref 150–400)
RBC: 3.84 MIL/uL — ABNORMAL LOW (ref 4.22–5.81)
RDW: 18.7 % — ABNORMAL HIGH (ref 11.5–15.5)
Smear Review: NORMAL
WBC: 16.1 10*3/uL — ABNORMAL HIGH (ref 4.0–10.5)
nRBC: 0 % (ref 0.0–0.2)

## 2022-02-07 LAB — COMPREHENSIVE METABOLIC PANEL
ALT: 102 U/L — ABNORMAL HIGH (ref 0–44)
AST: 102 U/L — ABNORMAL HIGH (ref 15–41)
Albumin: 3.4 g/dL — ABNORMAL LOW (ref 3.5–5.0)
Alkaline Phosphatase: 413 U/L — ABNORMAL HIGH (ref 38–126)
Anion gap: 12 (ref 5–15)
BUN: 22 mg/dL (ref 8–23)
CO2: 25 mmol/L (ref 22–32)
Calcium: 9.1 mg/dL (ref 8.9–10.3)
Chloride: 97 mmol/L — ABNORMAL LOW (ref 98–111)
Creatinine, Ser: 1.22 mg/dL (ref 0.61–1.24)
GFR, Estimated: 56 mL/min — ABNORMAL LOW (ref >60)
Glucose, Bld: 148 mg/dL — ABNORMAL HIGH (ref 70–99)
Potassium: 4.5 mmol/L (ref 3.5–5.1)
Sodium: 134 mmol/L — ABNORMAL LOW (ref 135–145)
Total Bilirubin: 0.5 mg/dL (ref 0.3–1.2)
Total Protein: 8.4 g/dL — ABNORMAL HIGH (ref 6.5–8.1)

## 2022-02-07 LAB — MAGNESIUM: Magnesium: 1.6 mg/dL — ABNORMAL LOW (ref 1.7–2.4)

## 2022-02-07 LAB — TSH: TSH: 3.694 u[IU]/mL (ref 0.350–4.500)

## 2022-02-07 NOTE — Progress Notes (Addendum)
Symptom Management Clinic [facility] at [facility] [phone]) [phone] [phone]  Patient Care Team: Pcp, No as PCP - General Randy Frazier Randy Pizza, MD as PCP - Cardiology (Cardiology) Randy Iba, MD as Consulting Physician (Cardiology) Randy Gutter, RN as Oncology Nurse Navigator   Name of the patient: Randy Frazier  [id number]  Jun 10, 1927   Date of visit: 02/11/22  Oenological History: Hepatocellular Carcinoma   Reason for Consult: Randy Frazier is a 88 y.o. male who presents today for:  Diarrhea/weakness: Patient presented as a walk in today for weakness and diarrhea. He was subsequently placed on our [facility] clinic schedule. He was originally see for this diarrhea on 01/27/2022 by Dr. Lenard Frazier in the Er. At that time he reported nausea and loose stools every time he ate. He had extensive work up including labs, CT imaging. CT scan did not show any evidence of colitis. He was treated with IVF, zofran which improved how he felt and he was discharged home. It appears that he was supposed to be screening for C. Diff but I do not see the result of this testing in Epic.   Today he reports that his main concern is actually his lack of ability to sleep. He reports that since his cholecystectomy over 3 weeks ago he has only been able to sleep 2 hours per night from 10 pm until midnight. He has been taking tylenol pm, benadryl, and has tried a prescription of an rx sleeping pill that he got from his PCP (states 5 or 50 mg- only given 5 pills worth). This rx medication "knocked him out" for two hours but did not work longer than this. He reports that things have gotten so bad that today he is in crisis. He reports that he is not willing to "live another day without sleep". Before walking into our office he visited a sleep center in desperation but was told that they only treat sleep apnea. He tells myself and my nurse that he will "kill [himself]  if that is what it takes". He goes on to tell me about friends who have committed suicide and how they performed the acts. He also mentions that he has set everything up for his sons so that they would "not need to do anything" when he passes. He reports that he has a plan but that it does not involve "shooting himself". He is tearful and we had a long talk about his life accomplishments. He reports that he is angry about his recent surgery and the antibiotics they gave him- he feels that this is what caused him to now not be able to sleep. He asks if he can call his son to meet him at the hospital. He repetitively states that he "should not have told [me] that [he] was going to kill [himself]. He also states that he is "not crazy" and that he would not "kill himself or shoot up other people". He denies hallucinations or delusions.    Denies any neurologic complaints. Denies recent fevers or illnesses. Denies any easy bleeding or bruising. Reports good appetite and denies weight loss. Denies chest pain. Denies any nausea, vomiting, constipation, or diarrhea. Denies urinary complaints. Patient offers no further specific complaints today.    PAST MEDICAL HISTORY: Past Medical History:  Diagnosis Date   Actinic keratosis    Arrhythmia    A-Fib   BPH (benign prostatic hyperplasia)    Coronary artery disease 02/11/2007   CABG X 3  @  [facility]   CVA (cerebral infarction) 02/11/2012   Diabetes mellitus without complication (HCC)    Enlarged prostate    GERD (gastroesophageal reflux disease)    Hyperlipidemia    Hypertension    Kidney stone    Renal disorder    Skin cancer    scalp, tx by Dr Jarold Frazier 2000s?    PAST SURGICAL HISTORY:  Past Surgical History:  Procedure Laterality Date   cataract surgery Bilateral    CORONARY ARTERY BYPASS GRAFT  2009   CABG X 3 @ [facility]   LIVER BIOPSY  01/06/2022   Procedure: LIVER BIOPSY;  Surgeon: Randy Lerner, MD;  Location: [facility] ORS;  Service: General;;    TEAR DUCT PROBING Right 05/07/2016   Procedure: Evacuation of hematoma, right peri-orbital bleed.;  Surgeon: Randy Flock, MD;  Location: [facility] OR;  Service: Ophthalmology;  Laterality: Right;    HEMATOLOGY/ONCOLOGY HISTORY:  Oncology History  Hepatocellular carcinoma (HCC)  01/25/2022 Initial Diagnosis   Hepatocellular carcinoma (HCC)   01/25/2022 Cancer Staging   Staging form: Liver, AJCC 8th Edition - Clinical: Stage IIIA (cT3, cN0, cM0) - Signed by Randy Hines, MD on 01/25/2022   01/29/2022 -  Chemotherapy   Patient is on Treatment Plan : HCC Atezolizumab q21d       ALLERGIES:  is allergic to ibuprofen and penicillins.  MEDICATIONS:  Current Outpatient Medications  Medication Sig Dispense Refill   amLODipine (NORVASC) 10 MG tablet TAKE 1 TABLET BY MOUTH EVERY DAY 30 tablet 6   aspirin EC 81 MG tablet Take 81 mg by mouth.     atorvastatin (LIPITOR) 10 MG tablet Take 1 tablet (10 mg total) by mouth every other day. 90 tablet 1   clopidogrel (PLAVIX) 75 MG tablet TAKE 1 TABLET(75 MG) BY MOUTH DAILY 90 tablet 0   ezetimibe (ZETIA) 10 MG tablet Take 10 mg by mouth daily.     FLUoxetine (PROZAC) 20 MG capsule Take 20 mg by mouth daily.     gabapentin (NEURONTIN) 300 MG capsule Take 300 mg by mouth 3 (three) times daily.      lidocaine-prilocaine (EMLA) cream Apply to affected area once 30 g 3   lisinopril (PRINIVIL,ZESTRIL) 20 MG tablet Take 20 mg by mouth daily.      Magnesium 500 MG TABS Take 500 mg by mouth daily.     metoprolol tartrate (LOPRESSOR) 25 MG tablet Take 25 mg by mouth 2 (two) times daily.     nystatin (MYCOSTATIN) 100000 UNIT/ML suspension Take 5 mLs by mouth 4 (four) times daily.     ondansetron (ZOFRAN) 8 MG tablet Take 1 tablet (8 mg total) by mouth every 8 (eight) hours as needed for nausea or vomiting. 30 tablet 1   oxyCODONE (OXY IR/ROXICODONE) 5 MG immediate release tablet Take 1 tablet (5 mg total) by mouth every 4 (four) hours as needed for moderate  pain. 12 tablet 0   pantoprazole (PROTONIX) 40 MG tablet Take 1 tablet (40 mg total) by mouth daily. 30 tablet 0   prochlorperazine (COMPAZINE) 10 MG tablet Take 1 tablet (10 mg total) by mouth every 6 (six) hours as needed for nausea or vomiting. 30 tablet 1   traZODone (DESYREL) 50 MG tablet Take 1 tablet (50 mg total) by mouth at bedtime. 5 tablet 0   hydrOXYzine (ATARAX) 10 MG tablet Take 1 tablet (10 mg total) by mouth 3 (three) times daily as needed for itching. 20 tablet 0   insulin detemir (LEVEMIR) 100 UNIT/ML injection Inject  0.16 mLs (16 Units total) into the skin daily. (Patient not taking: Reported on 01/24/2022) 10 mL 0   No current facility-administered medications for this visit.    VITAL SIGNS: BP (!) 178/93   Pulse 75   Temp 98.2 F (36.8 C)   Resp 16  There were no vitals filed for this visit.  Estimated body mass index is 23.53 kg/m as calculated from the following:   Height as of 02/10/22: 5\' 10"  (1.778 m).   Weight as of 02/10/22: 164 lb (74.4 kg).  LABS: CBC:    Component Value Date/Time   WBC 18.7 (H) 02/10/2022 0314   WBC 18.8 (H) 02/10/2022 0314   HGB 10.1 (L) 02/10/2022 0314   HGB 10.1 (L) 02/10/2022 0314   HGB 14.6 03/30/2013 0511   HCT 31.3 (L) 02/10/2022 0314   HCT 31.8 (L) 02/10/2022 0314   HCT 43.2 03/30/2013 0511   PLT 216 02/10/2022 0314   PLT 215 02/10/2022 0314   PLT 190 03/30/2013 0511   MCV 83.7 02/10/2022 0314   MCV 83.9 02/10/2022 0314   MCV 93 03/30/2013 0511   NEUTROABS 5.1 02/10/2022 0314   NEUTROABS 7.9 (H) 03/30/2013 0511   LYMPHSABS 10.2 (H) 02/10/2022 0314   LYMPHSABS 0.3 (L) 03/30/2013 0511   MONOABS 2.6 (H) 02/10/2022 0314   MONOABS 0.3 03/30/2013 0511   EOSABS 0.7 (H) 02/10/2022 0314   EOSABS 0.0 03/30/2013 0511   BASOSABS 0.1 02/10/2022 0314   BASOSABS 0.0 03/30/2013 0511   Comprehensive Metabolic Panel:    Component Value Date/Time   NA 134 (L) 02/10/2022 0314   NA 135 (L) 03/30/2013 0511   K 4.1 02/10/2022 0314    K 4.3 03/30/2013 0511   CL 99 02/10/2022 0314   CL 103 03/30/2013 0511   CO2 27 02/10/2022 0314   CO2 25 03/30/2013 0511   BUN 19 02/10/2022 0314   BUN 20 (H) 03/30/2013 0511   CREATININE 1.25 (H) 02/10/2022 0314   CREATININE 1.54 (H) 03/30/2013 0511   CREATININE 1.23 06/26/2010 0851   GLUCOSE 138 (H) 02/10/2022 0314   GLUCOSE 219 (H) 03/30/2013 0511   CALCIUM 9.2 02/10/2022 0314   CALCIUM 8.5 03/30/2013 0511   AST 284 (H) 02/10/2022 0314   AST 21 03/30/2013 0511   ALT 237 (H) 02/10/2022 0314   ALT 35 03/30/2013 0511   ALKPHOS 631 (H) 02/10/2022 0314   ALKPHOS 75 03/30/2013 0511   BILITOT 2.6 (H) 02/10/2022 0314   BILITOT 0.5 03/30/2013 0511   PROT 8.4 (H) 02/10/2022 0314   PROT 7.7 03/30/2013 0511   ALBUMIN 3.4 (L) 02/10/2022 0314   ALBUMIN 3.7 03/30/2013 0511    RADIOGRAPHIC STUDIES: US Abdomen Limited RUQ (LIVER/GB)  Result Date: 02/10/2022 CLINICAL DATA:  88 year old male with elevated LFTs. Laparoscopic cholecystectomy 4 weeks ago. EXAM: ULTRASOUND ABDOMEN LIMITED RIGHT UPPER QUADRANT COMPARISON:  01/27/2022 CT and prior studies FINDINGS: Gallbladder: Not visualized compatible with cholecystectomy. No abnormality identified in the cholecystectomy bed. Common bile duct: Diameter: 5 mm. There is no evidence of intrahepatic or extrahepatic biliary dilatation. Liver: Cirrhosis changes are again identified. Heterogeneous hepatic echotexture noted. A 5.1 x 4 x 3.8 cm LEFT hepatic mass does not appear significantly changed given technical differences. Portal vein is patent on color Doppler imaging with normal direction of blood flow towards the liver. Other: None. IMPRESSION: 1. Cirrhosis. No significant change in LEFT liver mass given technical differences. 2. No biliary dilatation. 3. Status post cholecystectomy. No abnormality identified in the cholecystectomy  bed. Electronically Signed   By: Harmon Pier M.D.   On: 02/10/2022 10:35   CT ABDOMEN PELVIS WO CONTRAST  Result Date:  01/28/2022 CLINICAL DATA:  Diarrhea EXAM: CT ABDOMEN AND PELVIS WITHOUT CONTRAST TECHNIQUE: Multidetector CT imaging of the abdomen and pelvis was performed following the standard protocol without IV contrast. RADIATION DOSE REDUCTION: This exam was performed according to the departmental dose-optimization program which includes automated exposure control, adjustment of the mA and/or kV according to patient size and/or use of iterative reconstruction technique. COMPARISON:  CT abdomen and pelvis 12/08/2021. MRI abdomen 12/19/2021. FINDINGS: Lower chest: No acute abnormality. Hepatobiliary: Liver is enlarged with nodular liver contour, unchanged. Previously identified left hepatic mass is grossly unchanged but not well defined on this noncontrast study measuring 4.3 x 6.6 cm image 2/7. Scattered other smaller hypodense nodular areas correspond to MRI findings are grossly unchanged given lack of intravenous contrast. The gallbladder is not visualized. There is no biliary ductal dilatation. Pancreas: Unremarkable. No pancreatic ductal dilatation or surrounding inflammatory changes. Spleen: Normal in size without focal abnormality. Adrenals/Urinary Tract: Bilateral renal cysts measuring up to 4.8 cm on the left and 8.9 cm on the right appear unchanged. The kidneys are otherwise within normal limits. Bladder is within normal limits. Left adrenal nodule is rounded measuring 14 mm is indeterminate and slightly larger when compared to 12/08/2021. The right adrenal gland is within normal limits. Stomach/Bowel: Stomach is within normal limits. Appendix appears normal. No evidence of bowel wall thickening, distention, or inflammatory changes. There is sigmoid colon diverticulosis. There is a large amount of stool throughout the colon. Vascular/Lymphatic: Aortic atherosclerosis. There is an enlarged portacaval lymph node measuring 1.6 x 3.1 cm, unchanged. Are additional scattered prominent upper abdominal lymph nodes,  unchanged. There are prominent, but nonenlarged retroperitoneal lymph nodes and iliac chain lymph nodes, unchanged. Reproductive: Prostate gland is enlarged, unchanged. Other: There is marked fat stranding and small lymph nodes throughout the central mesentery similar to the prior study. Musculoskeletal: Degenerative changes affect the spine and hips. IMPRESSION: 1. No acute localizing process in the abdomen or pelvis. 2. Large amount of stool throughout the colon compatible with constipation. 3. Stable findings of cirrhosis. 4. Grossly unchanged hepatic lesions/masses. 5. Stable lymphadenopathy in the upper abdomen and retroperitoneum. 6. 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 7. Left adrenal nodule is indeterminate, slightly larger when compared to 12/08/2021. Metastatic disease not excluded. Aortic Atherosclerosis (ICD10-I70.0). Electronically Signed   By: Darliss Cheney M.D.   On: 01/28/2022 00:05    PERFORMANCE STATUS (ECOG) : 1 - Symptomatic but completely ambulatory  Review of Systems Unless otherwise noted, a complete review of systems is negative.  Physical Exam General: NAD Cardiovascular: regular rate and rhythm Pulmonary: clear ant fields Abdomen: soft, nontender, + bowel sounds GU: no suprapubic tenderness Extremities: no edema, no joint deformities Skin: no rashes Neurological: Weakness but otherwise nonfocal Psych: Anxious, pressured speech. Tearful at times.   Assessment and Plan- Patient is a 88 y.o. male    Encounter Diagnoses  Name Primary?   Suicidal ideation Yes   Trouble in sleeping    Elevated LFTs    I am very concerned about patient. Verbalizes suicidal ideation with plan. Has prepared for his passing, elderly, recent cancer diagnosis. His lack of sleep over the past few weeks appears to have caused significant mental health challenges. I discussed my concern with patient who agrees that the safest place for him is in  the hospital.  We also discussed his LFT elevation and that this may contribute to his mental state. I discussed why I would recommend hospital monitoring. He is agreeable. He called his son who met [place] at the cancer center. We all discussed- with patients permission- the reasons for my concern. He verbalizes that he agrees that his father is unwell and that he also supports him being admitted to the hospital to help him sleep, work through his psychiatric challenges and monitor his liver functions. Nurse alerted and she wheeled patient to the ER with handoff. I also called the triage nurse at the ER to confirm patient's SI.   Patient expressed understanding and was in agreement with this plan. He also understands that He can call clinic at any time with any questions, concerns, or complaints.   Thank you for allowing me to participate in the care of this very pleasant patient.   Time Total: 65  Visit consisted of counseling and education dealing with the complex and emotionally intense issues of symptom management in the setting of serious illness.Greater than 50%  of this time was spent counseling and coordinating care related to the above assessment and plan.  Signed by: Clent Jacks, PA-C

## 2022-02-07 NOTE — Telephone Encounter (Signed)
Pt . Came up to cancer center and asked the scheduler to see someone. I went to the waiting area and he was not happy. He states that he set in the ER for 6 hours and then went to the desk ans asked how long before pt. Is seen. He was told that there are 48 patients in front of him. He walked our and came to cancer center and asked why is he not in ER. He states that he stayed there for 6 hours and there was so many people ahead of him that he wants to go home and get something to eat. I offered him snacks and soda and some other things and he wanted a meal not snacks. He did ask why we sent him down to ER knowing that they were so many people waiting. I told him that we do not know how many people are in ER until you go there. I told him that Judson Roch had called ahead to MD in ER to tell them what is going on with the pt. Te pt. Told the nurse at the desk that he was here for sleep study because he can't sleep. I told the patient that I was told that pt said that he would rather die if he can't get some sleep. He takes benadryl 10 pm and wake up at 12 am and can't sleep. He wants a med to help him sleep. I told him that there is not a pill for sleep that would be good for him because his liver enzymes are elevated and with his [facility] it makes the liver worse. He is mad and wants to go home. I called his son and no  one answered. Waited for 5 minutes and called his son and I told him that he has not been seen in ER and there was 23 people ahead of him and he wants to go home.  The son said that he will come and get him after he finishes eating. Asked me what time we close and I said 5 pm. By this time it was about 4:45. The son says I will be there soon. Pt. Was telling me that he is going to talk to someone about this. He said that he is going to talk to people in charge , he never wants to see Judson Roch ad maybe sue the hospital. I told him that I am sorry that you can't get an medicine to help him sleep. I understood that  you had some issues about you was telling sarah about other friend s he knows that has a plan for going out of this world and  when people say that we have to make  a plan because this is priority to assist to be safe environment and get help. Then he said that he did not mean it when he told her that he would rather die if he can't get sleep. While I was with him he wanted me to tell him what med he can take to sleep. I did look up meds for sleep with people with [facility] and it came back with no meds are available because the sleeping pills goes through the liver and we do not want it to get worse. Then he asked what was someone going to do if e went to hospital. I told him that he would be monitored in safe environment and either given IV or oral meds that they can give but make sure you are safe on  the drugs. Then he got mad that son is not here in timely manner, then he went back to take the dx. Of take the suicidal ideations off his chart, then wanted to name and number for him to call- I gave him the patient relations and was told by operator that he could leave a message and someone will call him back and see what can be done to help. Then he talks about Judson Roch told him sleep test not suicidal thoughts. I called again to the son and he says he is on his way soon. I asked him on the phone if Judson Roch had told the pt. Sleep study because before I took pt to [place] was talking to pt ans son. The son says she did not say sleep she said suicidal. I asked the son that since he is not going to ER again can he stay with him and he said he will. I then told pt that he will be here son. I asked where his son lives and 1 of them live beside of him and his son comes to his house several times a week and sleeps there. When the son came, the pt. Said thank you for staying with me until son came.  He said I do not know how I am going to sleep. I told him that he told me he has sleep 2 hours yest and 2 hour today and with the  situation he has had today maybe he will be so tired that I hope he will have a good sleep.

## 2022-02-07 NOTE — Telephone Encounter (Signed)
   Name: Randy Frazier  DOB: 11/25/1927  MRN: [id number]  Primary Cardiologist: Ida Rogue, MD  Chart reviewed as part of pre-operative protocol coverage. Because of Randy Frazier's past medical history and time since last visit, he will require a follow-up in-office visit in order to better assess preoperative cardiovascular risk.  Pre-op covering staff: - Please schedule appointment and call patient to inform them. If patient already had an upcoming appointment within acceptable timeframe, please add "pre-op clearance" to the appointment notes so provider is aware. - Please contact requesting surgeon's office via preferred method (i.e, phone, fax) to inform them of need for appointment prior to surgery.  Lenna Sciara, NP  02/07/2022, 9:33 AM

## 2022-02-07 NOTE — Telephone Encounter (Signed)
Called the son and wanted to let him know that with him being in ER and is suppose to get admitted to hospital. If so some of the things we made has to reschedule. He is aware of that and will keep up with him and sees how it goes. Son understands

## 2022-02-07 NOTE — ED Triage Notes (Signed)
Per MD at Cancer center pt is SI and has plan. Pt hasn't been sleeping. Pt newly dx with liver cancer. Pt [place] has elevated lfts.

## 2022-02-07 NOTE — Telephone Encounter (Signed)
Left message for pt to call back and schedule an app in office for pre op clearance. Was going to offer appt today at 3:35 with Gerrie Nordmann, NP if pt had answered the call.  I will update the requesting office the pt is going to need an appt in office for pre op clearance.

## 2022-02-07 NOTE — ED Triage Notes (Signed)
Patient ambulatory from cancer center stating he has not been able to sleep for the past month. Only having 2 hour naps here and there. When asked patient about SI thoughts patient repeatedly saying no, "I never said anything about that."

## 2022-02-07 NOTE — ED Provider Triage Note (Signed)
Emergency Medicine Provider Triage Evaluation Note  Randy Frazier , a 88 y.o. male  was evaluated in triage.  Pt complains of abnormal labs. Sent from cancer center because he had liver cancer and abnormal LFTs. Patient reports he can't sleep x4 weeks. Per first nurse, there was some concern that the patient voiced SI but the patient adamantly denies that.  Had labs drawn in clinic today.  Review of Systems  Positive: Can't sleep Negative: Fever, n/v/d  Physical Exam  There were no vitals taken for this visit. Gen:   Awake, no distress   Resp:  Normal effort  MSK:   Moves extremities without difficulty  Other:    Medical Decision Making  Medically screening exam initiated at 1:01 PM.  Appropriate orders placed.  [name] was informed that the remainder of the evaluation will be completed by another provider, this initial triage assessment does not replace that evaluation, and the importance of remaining in the ED until their evaluation is complete.     Marquette Old, PA-C 02/07/22 1305

## 2022-02-07 NOTE — Telephone Encounter (Signed)
   Pre-operative Risk Assessment    Patient Name: Randy Frazier  DOB: 11/24/1927 MRN: [id number]      Request for Surgical Clearance    Procedure:   EGD at [facility]  Date of Surgery:  Clearance 02/17/22                                 Surgeon:  Dr Haig Prophet Surgeon's Group or Practice Name:  [facility] Phone number:  [phone] Fax number:  [phone]   Type of Clearance Requested:   - Pharmacy:  Hold Clopidogrel (Plavix) discontinue 5 days prior   Type of Anesthesia:  Not Indicated   Additional requests/questions:    Manfred Arch   02/07/2022, 8:12 AM

## 2022-02-08 LAB — T4: T4, Total: 7.8 ug/dL (ref 4.5–12.0)

## 2022-02-10 ENCOUNTER — Emergency Department
Admission: EM | Admit: 2022-02-10 | Discharge: 2022-02-10 | Disposition: A | Discharge status: Home / Self Care | Payer: Medicare Other | Attending: Emergency Medicine | Admitting: Emergency Medicine

## 2022-02-10 ENCOUNTER — Other Ambulatory Visit: Payer: Self-pay

## 2022-02-10 ENCOUNTER — Encounter: Payer: Self-pay | Admitting: Emergency Medicine

## 2022-02-10 ENCOUNTER — Emergency Department: Payer: Medicare Other

## 2022-02-10 DIAGNOSIS — G4700 Insomnia, unspecified: Secondary | ICD-10-CM

## 2022-02-10 DIAGNOSIS — R748 Abnormal levels of other serum enzymes: Secondary | ICD-10-CM

## 2022-02-10 DIAGNOSIS — R7401 Elevation of levels of liver transaminase levels: Secondary | ICD-10-CM | POA: Diagnosis not present

## 2022-02-10 DIAGNOSIS — L299 Pruritus, unspecified: Secondary | ICD-10-CM | POA: Diagnosis present

## 2022-02-10 LAB — CBC WITH DIFFERENTIAL/PLATELET
Abs Immature Granulocytes: 0.07 10*3/uL (ref 0.00–0.07)
Basophils Absolute: 0.1 10*3/uL (ref 0.0–0.1)
Basophils Relative: 1 %
Eosinophils Absolute: 0.7 10*3/uL — ABNORMAL HIGH (ref 0.0–0.5)
Eosinophils Relative: 4 %
HCT: 31.8 % — ABNORMAL LOW (ref 39.0–52.0)
Hemoglobin: 10.1 g/dL — ABNORMAL LOW (ref 13.0–17.0)
Immature Granulocytes: 0 %
Lymphocytes Relative: 54 %
Lymphs Abs: 10.2 10*3/uL — ABNORMAL HIGH (ref 0.7–4.0)
MCH: 26.6 pg (ref 26.0–34.0)
MCHC: 31.8 g/dL (ref 30.0–36.0)
MCV: 83.9 fL (ref 80.0–100.0)
Monocytes Absolute: 2.6 10*3/uL — ABNORMAL HIGH (ref 0.1–1.0)
Monocytes Relative: 14 %
Neutro Abs: 5.1 10*3/uL (ref 1.7–7.7)
Neutrophils Relative %: 27 %
Platelets: 215 10*3/uL (ref 150–400)
RBC: 3.79 MIL/uL — ABNORMAL LOW (ref 4.22–5.81)
RDW: 19 % — ABNORMAL HIGH (ref 11.5–15.5)
WBC: 18.8 10*3/uL — ABNORMAL HIGH (ref 4.0–10.5)
nRBC: 0 % (ref 0.0–0.2)

## 2022-02-10 LAB — HEPATIC FUNCTION PANEL
ALT: 237 U/L — ABNORMAL HIGH (ref 0–44)
AST: 284 U/L — ABNORMAL HIGH (ref 15–41)
Albumin: 3.4 g/dL — ABNORMAL LOW (ref 3.5–5.0)
Alkaline Phosphatase: 631 U/L — ABNORMAL HIGH (ref 38–126)
Bilirubin, Direct: 1.5 mg/dL — ABNORMAL HIGH (ref 0.0–0.2)
Indirect Bilirubin: 1.1 mg/dL — ABNORMAL HIGH (ref 0.3–0.9)
Total Bilirubin: 2.6 mg/dL — ABNORMAL HIGH (ref 0.3–1.2)
Total Protein: 8.4 g/dL — ABNORMAL HIGH (ref 6.5–8.1)

## 2022-02-10 LAB — CBC
HCT: 31.3 % — ABNORMAL LOW (ref 39.0–52.0)
Hemoglobin: 10.1 g/dL — ABNORMAL LOW (ref 13.0–17.0)
MCH: 27 pg (ref 26.0–34.0)
MCHC: 32.3 g/dL (ref 30.0–36.0)
MCV: 83.7 fL (ref 80.0–100.0)
Platelets: 216 10*3/uL (ref 150–400)
RBC: 3.74 MIL/uL — ABNORMAL LOW (ref 4.22–5.81)
RDW: 18.7 % — ABNORMAL HIGH (ref 11.5–15.5)
WBC: 18.7 10*3/uL — ABNORMAL HIGH (ref 4.0–10.5)
nRBC: 0 % (ref 0.0–0.2)

## 2022-02-10 LAB — BASIC METABOLIC PANEL
Anion gap: 8 (ref 5–15)
BUN: 19 mg/dL (ref 8–23)
CO2: 27 mmol/L (ref 22–32)
Calcium: 9.2 mg/dL (ref 8.9–10.3)
Chloride: 99 mmol/L (ref 98–111)
Creatinine, Ser: 1.25 mg/dL — ABNORMAL HIGH (ref 0.61–1.24)
GFR, Estimated: 54 mL/min — ABNORMAL LOW (ref >60)
Glucose, Bld: 138 mg/dL — ABNORMAL HIGH (ref 70–99)
Potassium: 4.1 mmol/L (ref 3.5–5.1)
Sodium: 134 mmol/L — ABNORMAL LOW (ref 135–145)

## 2022-02-10 LAB — URINALYSIS, ROUTINE W REFLEX MICROSCOPIC
Bacteria, UA: NONE SEEN
Bilirubin Urine: NEGATIVE
Glucose, UA: NEGATIVE mg/dL
Hgb urine dipstick: NEGATIVE
Ketones, ur: NEGATIVE mg/dL
Leukocytes,Ua: NEGATIVE
Nitrite: NEGATIVE
Protein, ur: 30 mg/dL — AB
Specific Gravity, Urine: 1.015 (ref 1.005–1.030)
pH: 5 (ref 5.0–8.0)

## 2022-02-10 MED ORDER — HYDROXYZINE HCL 25 MG PO TABS
25.0000 mg | ORAL_TABLET | Freq: Once | ORAL | Status: AC
  Administered 2022-02-10: 25 mg via ORAL
  Filled 2022-02-10: qty 1

## 2022-02-10 MED ORDER — HYDROXYZINE HCL 10 MG PO TABS: 10.0000 mg | ORAL_TABLET | Freq: Three times a day (TID) | ORAL | 0 refills | Status: DC | PRN

## 2022-02-10 NOTE — ED Notes (Signed)
See triage note, pt reports itching all over body and trouble sleeping for past 3 days. Has liver cancer. Recently had gallbladder taken out, states health has declined since.

## 2022-02-10 NOTE — ED Notes (Signed)
Son updated by this RN at pt request

## 2022-02-10 NOTE — Discharge Instructions (Signed)
Please seek medical attention for any high fevers, chest pain, shortness of breath, change in behavior, persistent vomiting, bloody stool or any other new or concerning symptoms.  

## 2022-02-10 NOTE — ED Provider Notes (Signed)
[facility] Provider Note    Event Date/Time   First MD Initiated Contact with Patient 02/10/22 [phone]     (approximate)   History   Insomnia and Pruritis   HPI  Randy Frazier is a 89 y.o. male  who presents to the emergency department today because of concern for difficulty with sleep and itchiness. He says he has been having trouble sleeping for a little while although it has been worse over the past three days. He will go to bed and wake up around midnight and not have the ability to go back to sleep. He        Physical Exam   Triage Vital Signs: ED Triage Vitals  Enc Vitals Group     BP 02/10/22 0303 (!) 169/88     Pulse Rate 02/10/22 0303 76     Resp 02/10/22 0303 18     Temp 02/10/22 0303 (!) 97.5 F (36.4 C)     Temp Source 02/10/22 0303 Oral     SpO2 02/10/22 0303 100 %     Weight 02/10/22 0259 164 lb (74.4 kg)     Height 02/10/22 0259 '5\' 10"'$  (1.778 m)     Head Circumference --      Peak Flow --      Pain Score 02/10/22 0259 0     Pain Loc --      Pain Edu? --      Excl. in [place]? --     Most recent vital signs: Vitals:   02/10/22 0303 02/10/22 0622  BP: (!) 169/88 (!) 183/87  Pulse: 76 75  Resp: 18 18  Temp: (!) 97.5 F (36.4 C) 97.7 F (36.5 C)  SpO2: 100% 100%   General: Awake, alert, oriented. CV:  Good peripheral perfusion. Regular rate and rhythm. Resp:  Normal effort. Lungs clear. Abd:  No distention.    ED Results / Procedures / Treatments   Labs (all labs ordered are listed, but only abnormal results are displayed) Labs Reviewed  BASIC METABOLIC PANEL - Abnormal; Notable for the following components:      Result Value   Sodium 134 (*)    Glucose, Bld 138 (*)    Creatinine, Ser 1.25 (*)    GFR, Estimated 54 (*)    All other components within normal limits  CBC - Abnormal; Notable for the following components:   WBC 18.7 (*)    RBC 3.74 (*)    Hemoglobin 10.1 (*)    HCT 31.3 (*)    RDW 18.7 (*)     All other components within normal limits  URINALYSIS, ROUTINE W REFLEX MICROSCOPIC - Abnormal; Notable for the following components:   Color, Urine AMBER (*)    APPearance CLEAR (*)    Protein, ur 30 (*)    All other components within normal limits  HEPATIC FUNCTION PANEL - Abnormal; Notable for the following components:   Total Protein 8.4 (*)    Albumin 3.4 (*)    AST 284 (*)    ALT 237 (*)    Alkaline Phosphatase 631 (*)    Total Bilirubin 2.6 (*)    Bilirubin, Direct 1.5 (*)    Indirect Bilirubin 1.1 (*)    All other components within normal limits  CBC WITH DIFFERENTIAL/PLATELET - Abnormal; Notable for the following components:   WBC 18.8 (*)    RBC 3.79 (*)    Hemoglobin 10.1 (*)    HCT 31.8 (*)  RDW 19.0 (*)    Lymphs Abs 10.2 (*)    Monocytes Absolute 2.6 (*)    Eosinophils Absolute 0.7 (*)    All other components within normal limits     EKG  I, Nance Pear, attending physician, personally viewed and interpreted this EKG  EKG Time: 0323 Rate: 70 Rhythm: normal sinus rhythm Axis: left axis deviation Intervals: qtc 490 QRS: RBBB ST changes: no st elevation Impression: abnormal ekg   RADIOLOGY I independently interpreted and visualized the RUS [place]. My interpretation: No significant ductal dilation. Radiology interpretation:  IMPRESSION:  1. Cirrhosis. No significant change in LEFT liver mass given  technical differences.  2. No biliary dilatation.  3. Status post cholecystectomy. No abnormality identified in the  cholecystectomy bed.     PROCEDURES:  Critical Care performed: No  Procedures   MEDICATIONS ORDERED IN ED: Medications - No data to display   IMPRESSION / MDM / [place] / ED COURSE  I reviewed the triage vital signs and the nursing notes.                              Differential diagnosis includes, but is not limited to, biliary duct obstruction, cancer, cirrhosis.  Patient's presentation is most consistent  with acute presentation with potential threat to life or bodily function.  Patient presented to the emergency department today because of concern for itchiness and difficulty with sleep. Blood work does show elevated liver enzymes. Patient with history of liver cancer and recent cholecystectomy. Non tender to palpation in the right upper quadrant. Did obtain a RUQ [place] which did not show any biliary dilatation. At this time do have concern it is cancer related. Discussed this with the patient. Did feel some improvement with the atarax. Will give prescription for that medication. Discussed with patient importance of close follow up.  FINAL CLINICAL IMPRESSION(S) / ED DIAGNOSES   Final diagnoses:  Pruritus  Elevated liver enzymes  Insomnia, unspecified type     Note:  This document was prepared using Dragon voice recognition software and may include unintentional dictation errors.    Nance Pear, MD 02/10/22 1159

## 2022-02-10 NOTE — ED Notes (Signed)
Lab called to run hepatic panel and cbc with diff

## 2022-02-10 NOTE — ED Triage Notes (Addendum)
Pt arrived via POV with reports of unable to sleep for 3 days, and itching for 2 days, pt states it all started after he got his gall bladder out 11/27.  Pt states he itches from his neck down to his feet.  Pt recently dx with liver cancer on chemo  Pt repeatedly states "I know I'm dehydrated" states he has been drinking pedialyte.  Denies any N/V/D at this time.

## 2022-02-11 ENCOUNTER — Inpatient Hospital Stay: Payer: Medicare Other | Attending: Oncology

## 2022-02-11 ENCOUNTER — Telehealth: Payer: Self-pay | Admitting: *Deleted

## 2022-02-11 ENCOUNTER — Ambulatory Visit
Admission: RE | Admit: 2022-02-11 | Discharge: 2022-02-11 | Disposition: A | Discharge status: Home / Self Care | Payer: Medicare Other | Source: Ambulatory Visit | Attending: Oncology | Admitting: Oncology

## 2022-02-11 ENCOUNTER — Other Ambulatory Visit: Payer: Self-pay | Admitting: Oncology

## 2022-02-11 ENCOUNTER — Inpatient Hospital Stay (HOSPITAL_BASED_OUTPATIENT_CLINIC_OR_DEPARTMENT_OTHER): Payer: Medicare Other | Admitting: Oncology

## 2022-02-11 ENCOUNTER — Encounter: Payer: Self-pay | Admitting: Oncology

## 2022-02-11 VITALS — BP 175/90 | HR 76 | Temp 98.6°F | Resp 17 | Wt 161.0 lb

## 2022-02-11 DIAGNOSIS — C220 Liver cell carcinoma: Secondary | ICD-10-CM | POA: Insufficient documentation

## 2022-02-11 DIAGNOSIS — Z9049 Acquired absence of other specified parts of digestive tract: Secondary | ICD-10-CM | POA: Insufficient documentation

## 2022-02-11 DIAGNOSIS — R7989 Other specified abnormal findings of blood chemistry: Secondary | ICD-10-CM | POA: Insufficient documentation

## 2022-02-11 DIAGNOSIS — Z951 Presence of aortocoronary bypass graft: Secondary | ICD-10-CM | POA: Diagnosis not present

## 2022-02-11 DIAGNOSIS — E119 Type 2 diabetes mellitus without complications: Secondary | ICD-10-CM | POA: Diagnosis not present

## 2022-02-11 DIAGNOSIS — K7460 Unspecified cirrhosis of liver: Secondary | ICD-10-CM | POA: Insufficient documentation

## 2022-02-11 DIAGNOSIS — R32 Unspecified urinary incontinence: Secondary | ICD-10-CM | POA: Insufficient documentation

## 2022-02-11 DIAGNOSIS — I700 Atherosclerosis of aorta: Secondary | ICD-10-CM | POA: Insufficient documentation

## 2022-02-11 DIAGNOSIS — I10 Essential (primary) hypertension: Secondary | ICD-10-CM | POA: Insufficient documentation

## 2022-02-11 DIAGNOSIS — Z8673 Personal history of transient ischemic attack (TIA), and cerebral infarction without residual deficits: Secondary | ICD-10-CM | POA: Insufficient documentation

## 2022-02-11 DIAGNOSIS — Z85828 Personal history of other malignant neoplasm of skin: Secondary | ICD-10-CM | POA: Insufficient documentation

## 2022-02-11 MED ORDER — GADOBUTROL 1 MMOL/ML IV SOLN
7.0000 mL | Freq: Once | INTRAVENOUS | Status: AC | PRN
  Administered 2022-02-11: 7 mL via INTRAVENOUS

## 2022-02-11 NOTE — Progress Notes (Signed)
Hematology/Oncology Consult note [facility]  [phone] [phone]  Patient Care Team: Pcp, No as PCP - General Rockey Situ Kathlene November, MD as PCP - Cardiology (Cardiology) Minna Merritts, MD as Consulting Physician (Cardiology) Clent Jacks, RN as Oncology Nurse Navigator   Name of the patient: Randy Frazier  [id number]  [birth year]   Date of visit: 02/11/22  Diagnosis- stage IIIa multifocal hepatocellular carcinoma   Chief complaint/ Reason for visit-post ER follow-up  Heme/Onc history: Patient is a 89 year old male who was seen by GI after he had a CT abdomen in October 2023 for abdominal pain.  CT showed enhancing lesion in the left hepatic lobe measuring 3.6 x 2.5 cm.  This was followed by an MRI abdomen with and without contrast which shows a dominant mass measuring 6.1 x 3.8 x 3.5 cm in the segment 2 of the liver.  Multiple other foci of hyperenhancement up to 1.3 cm which were categorized as LI-RADS 4.  Other lesions which were small but are also suspicious for multifocal hepatocellular carcinoma LI-RADS 3.  The left portal vein was likely involved with the dominant high left hepatic lobe tumor.   He was found to have changes of cirrhosis as well on the scan although the cause of cirrhosis is presently unclear.     He has undergone some work-up by GI including hepatitis testing which was negative.  Iron studies are normal.  He is a lifelong teetotaler.  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 Tumor cells positive for pancytokeratin, HepPar1 and negative for CK7 CK20 TTF-1 CDX2 GATA3 PSA and CD56.  AFP mildly elevated at 7.7   Plan was to get started with single agent Tecentriq and subsequently add a Avastin after he gets an upper endoscopy.  Interval history-patient was in the ER with symptoms of itching.He has not had any fever or abdominal pain.  He was found to have  elevated LFTs which was attribute it to his malignancy and patient was discharged on hydroxyzine.  Patient tells me that he was told he does not have cancer in the ER.  Patient has lost about 20 pounds in the last 3 months.  States that he was able to sleepFor many hours and after a long time after he took hydroxyzine.  ECOG PS- 1 Pain scale- 0   Review of systems- Review of Systems  Constitutional:  Positive for malaise/fatigue. Negative for chills, fever and weight loss.  HENT:  Negative for congestion, ear discharge and nosebleeds.   Eyes:  Negative for blurred vision.  Respiratory:  Negative for cough, hemoptysis, sputum production, shortness of breath and wheezing.   Cardiovascular:  Negative for chest pain, palpitations, orthopnea and claudication.  Gastrointestinal:  Negative for abdominal pain, blood in stool, constipation, diarrhea, heartburn, melena, nausea and vomiting.  Genitourinary:  Negative for dysuria, flank pain, frequency, hematuria and urgency.  Musculoskeletal:  Negative for back pain, joint pain and myalgias.  Skin:  Negative for rash.  Neurological:  Negative for dizziness, tingling, focal weakness, seizures, weakness and headaches.  Endo/Heme/Allergies:  Does not bruise/bleed easily.  Psychiatric/Behavioral:  Negative for depression and suicidal ideas. The patient does not have insomnia.       Allergies  Allergen Reactions   Ibuprofen Swelling   Penicillins Swelling and Hives    lips Other reaction(s): Other (See Comments) Edema (lips)     Past Medical History:  Diagnosis Date   Actinic keratosis  Arrhythmia    A-Fib   BPH (benign prostatic hyperplasia)    Coronary artery disease 02/11/2007   CABG X 3  @ [facility]   CVA (cerebral infarction) 02/11/2012   Diabetes mellitus without complication (HCC)    Enlarged prostate    GERD (gastroesophageal reflux disease)    Hyperlipidemia    Hypertension    Kidney stone    Renal disorder    Skin cancer     scalp, tx by Dr Sharlett Iles 2000s?     Past Surgical History:  Procedure Laterality Date   cataract surgery Bilateral    CORONARY ARTERY BYPASS GRAFT  2009   CABG X 3 @ [facility]   LIVER BIOPSY  01/06/2022   Procedure: LIVER BIOPSY;  Surgeon: Ronny Bacon, MD;  Location: [facility] ORS;  Service: General;;   TEAR DUCT PROBING Right 05/07/2016   Procedure: Evacuation of hematoma, right peri-orbital bleed.;  Surgeon: Clista Bernhardt, MD;  Location: [place];  Service: Ophthalmology;  Laterality: Right;    Social History   Socioeconomic History   Marital status: Widowed    Spouse name: Not on file   Number of children: Not on file   Years of education: Not on file   Highest education level: Not on file  Occupational History   Not on file  Tobacco Use   Smoking status: Never   Smokeless tobacco: Never  Vaping Use   Vaping Use: Never used  Substance and Sexual Activity   Alcohol use: No    Comment: occasional   Drug use: No   Sexual activity: Not Currently  Other Topics Concern   Not on file  Social History Narrative   Not on file   Social Determinants of Health   Financial Resource Strain: Not on file  Food Insecurity: No Food Insecurity (01/02/2022)   Hunger Vital Sign    Worried About Running Out of Food in the Last Year: Never true    Ran Out of Food in the Last Year: Never true  Transportation Needs: No Transportation Needs (01/02/2022)   PRAPARE - Hydrologist (Medical): No    Lack of Transportation (Non-Medical): No  Physical Activity: Not on file  Stress: Not on file  Social Connections: Not on file  Intimate Partner Violence: Not At Risk (01/02/2022)   Humiliation, Afraid, Rape, and Kick questionnaire    Fear of Current or Ex-Partner: No    Emotionally Abused: No    Physically Abused: No    Sexually Abused: No    Family History  Family history unknown: Yes     Current Outpatient Medications:    amLODipine (NORVASC) 10 MG tablet,  TAKE 1 TABLET BY MOUTH EVERY DAY, Disp: 30 tablet, Rfl: 6   aspirin EC 81 MG tablet, Take 81 mg by mouth., Disp: , Rfl:    atorvastatin (LIPITOR) 10 MG tablet, Take 1 tablet (10 mg total) by mouth every other day., Disp: 90 tablet, Rfl: 1   clopidogrel (PLAVIX) 75 MG tablet, TAKE 1 TABLET(75 MG) BY MOUTH DAILY, Disp: 90 tablet, Rfl: 0   ezetimibe (ZETIA) 10 MG tablet, Take 10 mg by mouth daily., Disp: , Rfl:    FLUoxetine (PROZAC) 20 MG capsule, Take 20 mg by mouth daily., Disp: , Rfl:    gabapentin (NEURONTIN) 300 MG capsule, Take 300 mg by mouth 3 (three) times daily. , Disp: , Rfl:    hydrOXYzine (ATARAX) 10 MG tablet, Take 1 tablet (10 mg total) by mouth 3 (  three) times daily as needed for itching., Disp: 20 tablet, Rfl: 0   lidocaine-prilocaine (EMLA) cream, Apply to affected area once, Disp: 30 g, Rfl: 3   lisinopril (PRINIVIL,ZESTRIL) 20 MG tablet, Take 20 mg by mouth daily. , Disp: , Rfl:    Magnesium 500 MG TABS, Take 500 mg by mouth daily., Disp: , Rfl:    metoprolol tartrate (LOPRESSOR) 25 MG tablet, Take 25 mg by mouth 2 (two) times daily., Disp: , Rfl:    nystatin (MYCOSTATIN) 100000 UNIT/ML suspension, Take 5 mLs by mouth 4 (four) times daily., Disp: , Rfl:    ondansetron (ZOFRAN) 8 MG tablet, Take 1 tablet (8 mg total) by mouth every 8 (eight) hours as needed for nausea or vomiting., Disp: 30 tablet, Rfl: 1   oxyCODONE (OXY IR/ROXICODONE) 5 MG immediate release tablet, Take 1 tablet (5 mg total) by mouth every 4 (four) hours as needed for moderate pain., Disp: 12 tablet, Rfl: 0   prochlorperazine (COMPAZINE) 10 MG tablet, Take 1 tablet (10 mg total) by mouth every 6 (six) hours as needed for nausea or vomiting., Disp: 30 tablet, Rfl: 1   traZODone (DESYREL) 50 MG tablet, Take 1 tablet (50 mg total) by mouth at bedtime., Disp: 5 tablet, Rfl: 0   insulin detemir (LEVEMIR) 100 UNIT/ML injection, Inject 0.16 mLs (16 Units total) into the skin daily. (Patient not taking: Reported on  01/24/2022), Disp: 10 mL, Rfl: 0   pantoprazole (PROTONIX) 40 MG tablet, Take 1 tablet (40 mg total) by mouth daily., Disp: 30 tablet, Rfl: 0  Physical exam:  Vitals:   02/11/22 1109  BP: (!) 175/90  Pulse: 76  Resp: 17  Temp: 98.6 F (37 C)  TempSrc: Tympanic  SpO2: 98%  Weight: 161 lb (73 kg)   Physical Exam Cardiovascular:     Rate and Rhythm: Normal rate and regular rhythm.     Heart sounds: Normal heart sounds.  Pulmonary:     Effort: Pulmonary effort is normal.     Breath sounds: Normal breath sounds.  Abdominal:     General: Bowel sounds are normal.     Palpations: Abdomen is soft.  Skin:    General: Skin is warm and dry.  Neurological:     Mental Status: He is alert and oriented to person, place, and time.         Latest Ref Rng & Units 02/10/2022    3:14 AM  CMP  Glucose 70 - 99 mg/dL 138   BUN 8 - 23 mg/dL 19   Creatinine 0.61 - 1.24 mg/dL 1.25   Sodium 135 - 145 mmol/L 134   Potassium 3.5 - 5.1 mmol/L 4.1   Chloride 98 - 111 mmol/L 99   CO2 22 - 32 mmol/L 27   Calcium 8.9 - 10.3 mg/dL 9.2   Total Protein 6.5 - 8.1 g/dL 8.4   Total Bilirubin 0.3 - 1.2 mg/dL 2.6   Alkaline Phos 38 - 126 U/L 631   AST 15 - 41 U/L 284   ALT 0 - 44 U/L 237       Latest Ref Rng & Units 02/10/2022    3:14 AM  CBC  WBC 4.0 - 10.5 K/uL 4.0 - 10.5 K/uL 18.7    18.8   Hemoglobin 13.0 - 17.0 g/dL 13.0 - 17.0 g/dL 10.1    10.1   Hematocrit 39.0 - 52.0 % 39.0 - 52.0 % 31.3    31.8   Platelets 150 - 400 K/uL 150 -  400 K/uL 216    215     No images are attached to the encounter.  US Abdomen Limited RUQ (LIVER/GB)  Result Date: 02/10/2022 CLINICAL DATA:  89 year old male with elevated LFTs. Laparoscopic cholecystectomy 4 weeks ago. EXAM: ULTRASOUND ABDOMEN LIMITED RIGHT UPPER QUADRANT COMPARISON:  01/27/2022 CT and prior studies FINDINGS: Gallbladder: Not visualized compatible with cholecystectomy. No abnormality identified in the cholecystectomy bed. Common bile duct:  Diameter: 5 mm. There is no evidence of intrahepatic or extrahepatic biliary dilatation. Liver: Cirrhosis changes are again identified. Heterogeneous hepatic echotexture noted. A 5.1 x 4 x 3.8 cm LEFT hepatic mass does not appear significantly changed given technical differences. Portal vein is patent on color Doppler imaging with normal direction of blood flow towards the liver. Other: None. IMPRESSION: 1. Cirrhosis. No significant change in LEFT liver mass given technical differences. 2. No biliary dilatation. 3. Status post cholecystectomy. No abnormality identified in the cholecystectomy bed. Electronically Signed   By: Margarette Canada M.D.   On: 02/10/2022 10:35   CT ABDOMEN PELVIS WO CONTRAST  Result Date: 01/28/2022 CLINICAL DATA:  Diarrhea EXAM: CT ABDOMEN AND PELVIS WITHOUT CONTRAST TECHNIQUE: Multidetector CT imaging of the abdomen and pelvis was performed following the standard protocol without IV contrast. RADIATION DOSE REDUCTION: This exam was performed according to the departmental dose-optimization program which includes automated exposure control, adjustment of the mA and/or kV according to patient size and/or use of iterative reconstruction technique. COMPARISON:  CT abdomen and pelvis 12/08/2021. MRI abdomen 12/19/2021. FINDINGS: Lower chest: No acute abnormality. Hepatobiliary: Liver is enlarged with nodular liver contour, unchanged. Previously identified left hepatic mass is grossly unchanged but not well defined on this noncontrast study measuring 4.3 x 6.6 cm image 2/7. Scattered other smaller hypodense nodular areas correspond to MRI findings are grossly unchanged given lack of intravenous contrast. The gallbladder is not visualized. There is no biliary ductal dilatation. Pancreas: Unremarkable. No pancreatic ductal dilatation or surrounding inflammatory changes. Spleen: Normal in size without focal abnormality. Adrenals/Urinary Tract: Bilateral renal cysts measuring up to 4.8 cm on the left  and 8.9 cm on the right appear unchanged. The kidneys are otherwise within normal limits. Bladder is within normal limits. Left adrenal nodule is rounded measuring 14 mm is indeterminate and slightly larger when compared to 12/08/2021. The right adrenal gland is within normal limits. Stomach/Bowel: Stomach is within normal limits. Appendix appears normal. No evidence of bowel wall thickening, distention, or inflammatory changes. There is sigmoid colon diverticulosis. There is a large amount of stool throughout the colon. Vascular/Lymphatic: Aortic atherosclerosis. There is an enlarged portacaval lymph node measuring 1.6 x 3.1 cm, unchanged. Are additional scattered prominent upper abdominal lymph nodes, unchanged. There are prominent, but nonenlarged retroperitoneal lymph nodes and iliac chain lymph nodes, unchanged. Reproductive: Prostate gland is enlarged, unchanged. Other: There is marked fat stranding and small lymph nodes throughout the central mesentery similar to the prior study. Musculoskeletal: Degenerative changes affect the spine and hips. IMPRESSION: 1. No acute localizing process in the abdomen or pelvis. 2. Large amount of stool throughout the colon compatible with constipation. 3. Stable findings of cirrhosis. 4. Grossly unchanged hepatic lesions/masses. 5. Stable lymphadenopathy in the upper abdomen and retroperitoneum. 6. 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 7. Left adrenal nodule is indeterminate, slightly larger when compared to 12/08/2021. Metastatic disease not excluded. Aortic Atherosclerosis (ICD10-I70.0). Electronically Signed   By: Ronney Asters M.D.   On: 01/28/2022 00:05     Assessment and  plan- Patient is a 89 y.o. male who is here for follow-up of following issues:  Hepatocellular carcinoma: I have again gone over the findings of recent MRI CT scans and ultrasound with the patient which clearly shows that the patient has a6 cm liver  mass.  There is some concern for portacaval adenopathy although these lymph nodes may be reactive and have remained stable over the last 1 year.  He had a liver biopsy which also proved that he has hepatocellular carcinoma.  Patient was supposed to get a port placement this week and start Tecentriq next week.  However that we will be on hold and moved out by 1 week due to issues below.  2.  Abnormal LFTs: Patient underwent cholecystectomy by Dr. Christian Mate on 01/08/2022.His bilirubin immediately before and after the procedure was normal.  Patient's AST ALT and bilirubin have been elevated over the last 1 week and I suspect patient has choledocholithiasis.  I do not think that his elevated LFTs and bilirubin is secondary to his malignancy.  Patient did undergo an ultrasound in the ER which did not show any intra or extrahepatic dilatation but that does not necessarily rule out choledocholithiasis.  He will need MRI with MRCP as a neck step which I will be ordering stat.  Clinically patient does not seem to have any acute cholangitis.  If MRI and MRI with MRCP confirms stone in the common bile duct I will reach out to Dr. Allen Norris for an ERCP.  I will tentatively see him back in 2 weeks to start treatment pending potential ERCP   Visit Diagnosis 1. Hepatocellular carcinoma ([place])   2. Abnormal LFTs      Dr. Randa Evens, MD, MPH [facility] at [facility] [phone] 02/11/2022 1:57 PM

## 2022-02-11 NOTE — Telephone Encounter (Signed)
Apts have been scheduled with Merrily Pew, NP

## 2022-02-11 NOTE — Progress Notes (Signed)
Patient here for oncology follow-up appointment, expresses concerns of trouble swallowing from thrush

## 2022-02-11 NOTE — Telephone Encounter (Signed)
Per Dr. Janese Banks pt needs follow-up with Judson Roch, PA for lab/smc this week (to recheck liver enzymes), please arrange apt and notify patient.

## 2022-02-11 NOTE — Telephone Encounter (Signed)
Left message x 2 for the pt to call back for in office appt for pre op clearance.

## 2022-02-12 ENCOUNTER — Other Ambulatory Visit: Payer: Self-pay

## 2022-02-12 ENCOUNTER — Encounter: Payer: Self-pay | Admitting: Oncology

## 2022-02-13 ENCOUNTER — Ambulatory Visit: Admission: RE | Admit: 2022-02-13 | Payer: Medicare Other | Source: Ambulatory Visit | Admitting: Radiology

## 2022-02-13 ENCOUNTER — Inpatient Hospital Stay: Payer: Medicare Other

## 2022-02-13 ENCOUNTER — Other Ambulatory Visit: Payer: Self-pay

## 2022-02-13 ENCOUNTER — Inpatient Hospital Stay (HOSPITAL_BASED_OUTPATIENT_CLINIC_OR_DEPARTMENT_OTHER): Payer: Medicare Other | Admitting: Hospice and Palliative Medicine

## 2022-02-13 ENCOUNTER — Encounter: Payer: Self-pay | Admitting: Oncology

## 2022-02-13 ENCOUNTER — Inpatient Hospital Stay: Payer: Medicare Other | Admitting: Oncology

## 2022-02-13 ENCOUNTER — Encounter: Payer: Self-pay | Admitting: Hospice and Palliative Medicine

## 2022-02-13 VITALS — BP 152/79 | HR 87 | Temp 96.7°F | Resp 18

## 2022-02-13 DIAGNOSIS — C220 Liver cell carcinoma: Secondary | ICD-10-CM | POA: Diagnosis not present

## 2022-02-13 DIAGNOSIS — Z515 Encounter for palliative care: Secondary | ICD-10-CM

## 2022-02-13 LAB — CBC WITH DIFFERENTIAL/PLATELET
Abs Immature Granulocytes: 0.1 10*3/uL — ABNORMAL HIGH (ref 0.00–0.07)
Basophils Absolute: 0.1 10*3/uL (ref 0.0–0.1)
Basophils Relative: 1 %
Eosinophils Absolute: 0.5 10*3/uL (ref 0.0–0.5)
Eosinophils Relative: 2 %
HCT: 34 % — ABNORMAL LOW (ref 39.0–52.0)
Hemoglobin: 11.2 g/dL — ABNORMAL LOW (ref 13.0–17.0)
Immature Granulocytes: 1 %
Lymphocytes Relative: 48 %
Lymphs Abs: 10.8 10*3/uL — ABNORMAL HIGH (ref 0.7–4.0)
MCH: 27 pg (ref 26.0–34.0)
MCHC: 32.9 g/dL (ref 30.0–36.0)
MCV: 81.9 fL (ref 80.0–100.0)
Monocytes Absolute: 4 10*3/uL — ABNORMAL HIGH (ref 0.1–1.0)
Monocytes Relative: 19 %
Neutro Abs: 6.3 10*3/uL (ref 1.7–7.7)
Neutrophils Relative %: 29 %
Platelets: 230 10*3/uL (ref 150–400)
RBC: 4.15 MIL/uL — ABNORMAL LOW (ref 4.22–5.81)
RDW: 19.7 % — ABNORMAL HIGH (ref 11.5–15.5)
Smear Review: ADEQUATE
WBC Morphology: ABNORMAL
WBC: 21.8 10*3/uL — ABNORMAL HIGH (ref 4.0–10.5)
nRBC: 0 % (ref 0.0–0.2)

## 2022-02-13 LAB — COMPREHENSIVE METABOLIC PANEL
ALT: 139 U/L — ABNORMAL HIGH (ref 0–44)
AST: 83 U/L — ABNORMAL HIGH (ref 15–41)
Albumin: 3.5 g/dL (ref 3.5–5.0)
Alkaline Phosphatase: 451 U/L — ABNORMAL HIGH (ref 38–126)
Anion gap: 11 (ref 5–15)
BUN: 23 mg/dL (ref 8–23)
CO2: 27 mmol/L (ref 22–32)
Calcium: 8.9 mg/dL (ref 8.9–10.3)
Chloride: 97 mmol/L — ABNORMAL LOW (ref 98–111)
Creatinine, Ser: 1.2 mg/dL (ref 0.61–1.24)
GFR, Estimated: 57 mL/min — ABNORMAL LOW (ref >60)
Glucose, Bld: 132 mg/dL — ABNORMAL HIGH (ref 70–99)
Potassium: 4.6 mmol/L (ref 3.5–5.1)
Sodium: 135 mmol/L (ref 135–145)
Total Bilirubin: 1.1 mg/dL (ref 0.3–1.2)
Total Protein: 8.9 g/dL — ABNORMAL HIGH (ref 6.5–8.1)

## 2022-02-13 MED ORDER — HYDROXYZINE HCL 10 MG PO TABS: 10.0000 mg | ORAL_TABLET | Freq: Three times a day (TID) | ORAL | 0 refills | Status: DC | PRN

## 2022-02-13 NOTE — Telephone Encounter (Signed)
I will update the requesting office that we have tried to reach the pt to schedule an appt in office for pre op clearance. Pt has not returned our calls to make appt.

## 2022-02-13 NOTE — Progress Notes (Signed)
[place] [facility] [phone] [phone]  Patient Care Team: Pcp, No as PCP - General Randy Frazier, Randy November, MD as PCP - Cardiology (Cardiology) Randy Merritts, MD as Consulting Physician (Cardiology) Randy Jacks, RN as Oncology Nurse Navigator   NAME OF PATIENT: Randy Frazier  [id number]  Jul 30, 1927   DATE OF VISIT: 02/13/22  REASON FOR CONSULT: Randy Frazier is a 89 y.o. male with multiple medical problems including including CAD status post CABG, diabetes, history of CVA, stage IIIa multifocal hepatocellular carcinoma.  Patient was found to have a hepatic mass on CT/MRI in October 2023 after evaluation for abdominal pain.  He was subsequently hospitalized in [place] 2023 with sepsis secondary to acute cholecystitis.  He is status post cholecystectomy and liver biopsy.  INTERVAL HISTORY: Patient with recent transaminitis and hyperbilirubinemia.  He was seen in clinic on 02/11/2022 by Dr. Janese Frazier and underwent stat MRCP with findings of interval development of mild intrahepatic biliary duct dilation mainly isolated to the lateral segment of the left liver with abrupt transition centrally near the confluence of the left intrahepatic bile ducts.  No evidence of choledocholithiasis.  There was a new small rim-enhancing lesion anterior to the lateral segment of the left liver possibly reflecting interval development of tumor extension.  Unclear if GI was consulted and patient was felt unlikely to be amenable to stenting.  IR was consulted for percutaneous transhepatic cholangiography.  Patient walked in the cancer center requesting to be seen.  Patient has multiple concerns regarding his care.  He continues to endorse pruritus without rash.  This is causing insomnia and he reports both difficulty initiating and maintaining sleep.  He denies pain.  No fever or chills.  No GI symptoms.  Patient does endorse history of  urinary incontinence.  Patient offers no further specific complaints today.   SOCIAL HISTORY: Patient is a widower after being married for 63 years.  Patient has two sons, one of whom lives next-door and the other in the lawn.  Patient has lived in the same house on [place] since he was born.  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  He is involved in his church and has a Sports coach.  PAST MEDICAL HISTORY: Past Medical History:  Diagnosis Date   Actinic keratosis    Arrhythmia    A-Fib   BPH (benign prostatic hyperplasia)    Coronary artery disease 02/11/2007   CABG X 3  @ [facility]   CVA (cerebral infarction) 02/11/2012   Diabetes mellitus without complication (HCC)    Enlarged prostate    GERD (gastroesophageal reflux disease)    Hyperlipidemia    Hypertension    Kidney stone    Renal disorder    Skin cancer    scalp, tx by Dr Sharlett Iles 2000s?    PAST SURGICAL HISTORY:  Past Surgical History:  Procedure Laterality Date   cataract surgery Bilateral    CORONARY ARTERY BYPASS GRAFT  2009   CABG X 3 @ [facility]   LIVER BIOPSY  01/06/2022   Procedure: LIVER BIOPSY;  Surgeon: Ronny Bacon, MD;  Location: [facility] ORS;  Service: General;;   TEAR DUCT PROBING Right 05/07/2016   Procedure: Evacuation of hematoma, right peri-orbital bleed.;  Surgeon: Clista Bernhardt, MD;  Location: [facility];  Service: Ophthalmology;  Laterality: Right;    HEMATOLOGY/ONCOLOGY HISTORY:  Oncology History  Hepatocellular carcinoma ([place])  01/25/2022 Initial Diagnosis   Hepatocellular carcinoma ([place])   01/25/2022 Cancer Staging   Staging form: Liver, AJCC 8th Edition - Clinical: Stage IIIA (cT3, cN0, cM0) - Signed by Sindy Guadeloupe, MD on 01/25/2022   01/29/2022 -  Chemotherapy    Patient is on Treatment Plan : [place] Atezolizumab q21d       ALLERGIES:  is allergic to ibuprofen and penicillins.  MEDICATIONS:  Current Outpatient Medications  Medication Sig Dispense Refill   amLODipine (NORVASC) 10 MG tablet TAKE 1 TABLET BY MOUTH EVERY DAY 30 tablet 6   aspirin EC 81 MG tablet Take 81 mg by mouth.     atorvastatin (LIPITOR) 10 MG tablet Take 1 tablet (10 mg total) by mouth every other day. 90 tablet 1   clopidogrel (PLAVIX) 75 MG tablet TAKE 1 TABLET(75 MG) BY MOUTH DAILY 90 tablet 0   ezetimibe (ZETIA) 10 MG tablet Take 10 mg by mouth daily.     FLUoxetine (PROZAC) 20 MG capsule Take 20 mg by mouth daily.     gabapentin (NEURONTIN) 300 MG capsule Take 300 mg by mouth 3 (three) times daily.      hydrOXYzine (ATARAX) 10 MG tablet Take 1 tablet (10 mg total) by mouth 3 (three) times daily as needed for itching. 20 tablet 0   lidocaine-prilocaine (EMLA) cream Apply to affected area once 30 g 3   lisinopril (PRINIVIL,ZESTRIL) 20 MG tablet Take 20 mg by mouth daily.      Magnesium 500 MG TABS Take 500 mg by mouth daily.     metoprolol tartrate (LOPRESSOR) 25 MG tablet Take 25 mg by mouth 2 (two) times daily.     nystatin (MYCOSTATIN) 100000 UNIT/ML suspension Take 5 mLs by mouth 4 (four) times daily.     ondansetron (ZOFRAN) 8 MG tablet Take 1 tablet (8 mg total) by mouth every 8 (eight) hours as needed for nausea or vomiting. 30 tablet 1   oxyCODONE (OXY IR/ROXICODONE) 5 MG immediate release tablet Take 1 tablet (5 mg total) by mouth every 4 (four) hours as needed for moderate pain. 12 tablet 0   prochlorperazine (COMPAZINE) 10 MG tablet Take 1 tablet (10 mg total) by mouth every 6 (six) hours as needed for nausea or vomiting. 30 tablet 1   insulin detemir (LEVEMIR) 100 UNIT/ML injection Inject 0.16 mLs (16 Units total) into the skin daily. (Patient not taking: Reported on 01/24/2022) 10 mL 0   pantoprazole (PROTONIX) 40 MG tablet Take 1 tablet (40 mg total) by mouth  daily. 30 tablet 0   traZODone (DESYREL) 50 MG tablet Take 1 tablet (50 mg total) by mouth at bedtime. (Patient not taking: Reported on 02/13/2022) 5 tablet 0   No current facility-administered medications for this visit.    VITAL SIGNS: BP (!) 152/79   Pulse 87   Temp (!) 96.7 F (35.9 C) (Tympanic)   Resp 18  There were no vitals filed for this visit.  Estimated body mass index is 23.1 kg/m as calculated from the following:   Height as of 02/10/22: _0  (1.778 m).   Weight as of 02/11/22: 161 lb (73 kg).  LABS: CBC:    Component Value Date/Time   WBC 21.8 (H) 02/13/2022 1209   HGB 11.2 (L) 02/13/2022 1209   HGB 14.6 03/30/2013 0511   HCT 34.0 (  L) 02/13/2022 1209   HCT 43.2 03/30/2013 0511   PLT 230 02/13/2022 1209   PLT 190 03/30/2013 0511   MCV 81.9 02/13/2022 1209   MCV 93 03/30/2013 0511   NEUTROABS 6.3 02/13/2022 1209   NEUTROABS 7.9 (H) 03/30/2013 0511   LYMPHSABS 10.8 (H) 02/13/2022 1209   LYMPHSABS 0.3 (L) 03/30/2013 0511   MONOABS 4.0 (H) 02/13/2022 1209   MONOABS 0.3 03/30/2013 0511   EOSABS 0.5 02/13/2022 1209   EOSABS 0.0 03/30/2013 0511   BASOSABS 0.1 02/13/2022 1209   BASOSABS 0.0 03/30/2013 0511   Comprehensive Metabolic Panel:    Component Value Date/Time   NA 135 02/13/2022 1209   NA 135 (L) 03/30/2013 0511   K 4.6 02/13/2022 1209   K 4.3 03/30/2013 0511   CL 97 (L) 02/13/2022 1209   CL 103 03/30/2013 0511   CO2 27 02/13/2022 1209   CO2 25 03/30/2013 0511   BUN 23 02/13/2022 1209   BUN 20 (H) 03/30/2013 0511   CREATININE 1.20 02/13/2022 1209   CREATININE 1.54 (H) 03/30/2013 0511   CREATININE 1.23 06/26/2010 0851   GLUCOSE 132 (H) 02/13/2022 1209   GLUCOSE 219 (H) 03/30/2013 0511   CALCIUM 8.9 02/13/2022 1209   CALCIUM 8.5 03/30/2013 0511   AST 83 (H) 02/13/2022 1209   AST 21 03/30/2013 0511   ALT 139 (H) 02/13/2022 1209   ALT 35 03/30/2013 0511   ALKPHOS 451 (H) 02/13/2022 1209   ALKPHOS 75 03/30/2013 0511   BILITOT 1.1 02/13/2022 1209    BILITOT 0.5 03/30/2013 0511   PROT 8.9 (H) 02/13/2022 1209   PROT 7.7 03/30/2013 0511   ALBUMIN 3.5 02/13/2022 1209   ALBUMIN 3.7 03/30/2013 0511    RADIOGRAPHIC STUDIES: MR ABDOMEN MRCP W WO CONTAST  Result Date: 02/12/2022 CLINICAL DATA:  Biopsy-proven hepatocellular carcinoma. Now with abnormal LFTs. EXAM: MRI ABDOMEN WITHOUT AND WITH CONTRAST (INCLUDING MRCP) TECHNIQUE: Multiplanar multisequence MR imaging of the abdomen was performed both before and after the administration of intravenous contrast. Heavily T2-weighted images of the biliary and pancreatic ducts were obtained, and three-dimensional MRCP images were rendered by post processing. CONTRAST:  63m GADAVIST GADOBUTROL 1 MMOL/ML IV SOLN COMPARISON:  CT scan 01/27/2022.  MRI 12/19/2021 FINDINGS: Lower chest: Unremarkable. Hepatobiliary: Motion degraded assessment of the liver shows irregular contour with nodular parenchyma compatible with patient's known cirrhosis. Dominant lesion in the dome of the lateral segment left liver measures 6.5 x 4.4 cm today which compares to 6.1 x 3.8 cm on previous MRI. As before, there are numerous tiny foci of arterial phase hyperenhancement, slightly less conspicuous on today's study but persistent. Interval cholecystectomy. Interval development of mild intrahepatic biliary duct dilatation mainly isolated to the lateral segment left liver with abrupt transition centrally near the confluence of the left intrahepatic bile ducts (see coronal T2 image 24/2 and axial T2 image 15/3). There is no evidence for ductal wall thickening or ductal enhancement in the lateral segment left liver. High T2 signal within the mildly dilated bile ducts is preserved. There is some minimal intrahepatic biliary duct prominence in the medial segment left liver. No substantial biliary duct dilatation in the right liver. No evidence for choledocholithiasis. Common duct measures 6 mm diameter in the porta hepatis with 6 mm common bile  duct diameter in the head of the pancreas just proximal to the ampulla. Pancreas: No focal mass lesion. No dilatation of the main duct. No intraparenchymal cyst. No peripancreatic edema. Spleen:  No splenomegaly. No focal mass  lesion. Adrenals/Urinary Tract: Slight increase in size of left adrenal nodule now 1.5 cm compared to 1.1 cm previously. Bilateral renal cysts again noted. No followup imaging is recommended. Stomach/Bowel: Moderate distention of the stomach with food and fluid. Duodenum is normally positioned as is the ligament of Treitz. No small bowel or colonic dilatation within the visualized abdomen. Vascular/Lymphatic: No abdominal aortic aneurysm. Mild lymphadenopathy in the porta hepatis is similar to prior. Main and right portal vein remain patent. Left portal vein not clearly visualized throughout its course consistent with tumor involvement as noted previously. Other:  No substantial ascites. Musculoskeletal: No worrisome lytic or sclerotic osseous abnormality. IMPRESSION: 1. Interval development of mild intrahepatic biliary duct dilatation mainly isolated to the lateral segment left liver with abrupt transition centrally near the confluence of the left intrahepatic bile ducts. No evidence for ductal wall thickening or ductal enhancement in the lateral segment left liver. No filling defects identified within the mildly dilated left hepatic ducts. No overt MR features of ascending cholangitis. 2. No substantial biliary duct dilatation in the right liver. No evidence for choledocholithiasis. 3. Stable appearance of the dominant lesion in the dome of the lateral segment left liver. There is a new small rim enhancing lesion anterior to the lateral segment left liver, potentially reflecting interval development of tumor extension. As before, there are numerous tiny foci of arterial phase hyperenhancement, slightly less conspicuous on today's study but persistent. 4. Left portal vein not clearly  visualized throughout its course consistent with tumor involvement as noted previously. 5. Mild lymphadenopathy in the porta hepatis is similar to prior. 6. Slight increase in size of left adrenal nodule now 1.5 cm compared to 1.1 cm previously. Electronically Signed   By: Misty Stanley M.D.   On: 02/12/2022 04:26   MR 3D Recon At Scanner  Result Date: 02/12/2022 CLINICAL DATA:  Biopsy-proven hepatocellular carcinoma. Now with abnormal LFTs. EXAM: MRI ABDOMEN WITHOUT AND WITH CONTRAST (INCLUDING MRCP) TECHNIQUE: Multiplanar multisequence MR imaging of the abdomen was performed both before and after the administration of intravenous contrast. Heavily T2-weighted images of the biliary and pancreatic ducts were obtained, and three-dimensional MRCP images were rendered by post processing. CONTRAST:  17m GADAVIST GADOBUTROL 1 MMOL/ML IV SOLN COMPARISON:  CT scan 01/27/2022.  MRI 12/19/2021 FINDINGS: Lower chest: Unremarkable. Hepatobiliary: Motion degraded assessment of the liver shows irregular contour with nodular parenchyma compatible with patient's known cirrhosis. Dominant lesion in the dome of the lateral segment left liver measures 6.5 x 4.4 cm today which compares to 6.1 x 3.8 cm on previous MRI. As before, there are numerous tiny foci of arterial phase hyperenhancement, slightly less conspicuous on today's study but persistent. Interval cholecystectomy. Interval development of mild intrahepatic biliary duct dilatation mainly isolated to the lateral segment left liver with abrupt transition centrally near the confluence of the left intrahepatic bile ducts (see coronal T2 image 24/2 and axial T2 image 15/3). There is no evidence for ductal wall thickening or ductal enhancement in the lateral segment left liver. High T2 signal within the mildly dilated bile ducts is preserved. There is some minimal intrahepatic biliary duct prominence in the medial segment left liver. No substantial biliary duct dilatation in  the right liver. No evidence for choledocholithiasis. Common duct measures 6 mm diameter in the porta hepatis with 6 mm common bile duct diameter in the head of the pancreas just proximal to the ampulla. Pancreas: No focal mass lesion. No dilatation of the main duct. No intraparenchymal cyst. No peripancreatic  edema. Spleen:  No splenomegaly. No focal mass lesion. Adrenals/Urinary Tract: Slight increase in size of left adrenal nodule now 1.5 cm compared to 1.1 cm previously. Bilateral renal cysts again noted. No followup imaging is recommended. Stomach/Bowel: Moderate distention of the stomach with food and fluid. Duodenum is normally positioned as is the ligament of Treitz. No small bowel or colonic dilatation within the visualized abdomen. Vascular/Lymphatic: No abdominal aortic aneurysm. Mild lymphadenopathy in the porta hepatis is similar to prior. Main and right portal vein remain patent. Left portal vein not clearly visualized throughout its course consistent with tumor involvement as noted previously. Other:  No substantial ascites. Musculoskeletal: No worrisome lytic or sclerotic osseous abnormality. IMPRESSION: 1. Interval development of mild intrahepatic biliary duct dilatation mainly isolated to the lateral segment left liver with abrupt transition centrally near the confluence of the left intrahepatic bile ducts. No evidence for ductal wall thickening or ductal enhancement in the lateral segment left liver. No filling defects identified within the mildly dilated left hepatic ducts. No overt MR features of ascending cholangitis. 2. No substantial biliary duct dilatation in the right liver. No evidence for choledocholithiasis. 3. Stable appearance of the dominant lesion in the dome of the lateral segment left liver. There is a new small rim enhancing lesion anterior to the lateral segment left liver, potentially reflecting interval development of tumor extension. As before, there are numerous tiny foci of  arterial phase hyperenhancement, slightly less conspicuous on today's study but persistent. 4. Left portal vein not clearly visualized throughout its course consistent with tumor involvement as noted previously. 5. Mild lymphadenopathy in the porta hepatis is similar to prior. 6. Slight increase in size of left adrenal nodule now 1.5 cm compared to 1.1 cm previously. Electronically Signed   By: Misty Stanley M.D.   On: 02/12/2022 04:26   US Abdomen Limited RUQ (LIVER/GB)  Result Date: 02/10/2022 CLINICAL DATA:  89 year old male with elevated LFTs. Laparoscopic cholecystectomy 4 weeks ago. EXAM: ULTRASOUND ABDOMEN LIMITED RIGHT UPPER QUADRANT COMPARISON:  01/27/2022 CT and prior studies FINDINGS: Gallbladder: Not visualized compatible with cholecystectomy. No abnormality identified in the cholecystectomy bed. Common bile duct: Diameter: 5 mm. There is no evidence of intrahepatic or extrahepatic biliary dilatation. Liver: Cirrhosis changes are again identified. Heterogeneous hepatic echotexture noted. A 5.1 x 4 x 3.8 cm LEFT hepatic mass does not appear significantly changed given technical differences. Portal vein is patent on color Doppler imaging with normal direction of blood flow towards the liver. Other: None. IMPRESSION: 1. Cirrhosis. No significant change in LEFT liver mass given technical differences. 2. No biliary dilatation. 3. Status post cholecystectomy. No abnormality identified in the cholecystectomy bed. Electronically Signed   By: Margarette Canada M.D.   On: 02/10/2022 10:35   CT ABDOMEN PELVIS WO CONTRAST  Result Date: 01/28/2022 CLINICAL DATA:  Diarrhea EXAM: CT ABDOMEN AND PELVIS WITHOUT CONTRAST TECHNIQUE: Multidetector CT imaging of the abdomen and pelvis was performed following the standard protocol without IV contrast. RADIATION DOSE REDUCTION: This exam was performed according to the departmental dose-optimization program which includes automated exposure control, adjustment of the mA  and/or kV according to patient size and/or use of iterative reconstruction technique. COMPARISON:  CT abdomen and pelvis 12/08/2021. MRI abdomen 12/19/2021. FINDINGS: Lower chest: No acute abnormality. Hepatobiliary: Liver is enlarged with nodular liver contour, unchanged. Previously identified left hepatic mass is grossly unchanged but not well defined on this noncontrast study measuring 4.3 x 6.6 cm image 2/7. Scattered other smaller hypodense nodular areas correspond  to MRI findings are grossly unchanged given lack of intravenous contrast. The gallbladder is not visualized. There is no biliary ductal dilatation. Pancreas: Unremarkable. No pancreatic ductal dilatation or surrounding inflammatory changes. Spleen: Normal in size without focal abnormality. Adrenals/Urinary Tract: Bilateral renal cysts measuring up to 4.8 cm on the left and 8.9 cm on the right appear unchanged. The kidneys are otherwise within normal limits. Bladder is within normal limits. Left adrenal nodule is rounded measuring 14 mm is indeterminate and slightly larger when compared to 12/08/2021. The right adrenal gland is within normal limits. Stomach/Bowel: Stomach is within normal limits. Appendix appears normal. No evidence of bowel wall thickening, distention, or inflammatory changes. There is sigmoid colon diverticulosis. There is a large amount of stool throughout the colon. Vascular/Lymphatic: Aortic atherosclerosis. There is an enlarged portacaval lymph node measuring 1.6 x 3.1 cm, unchanged. Are additional scattered prominent upper abdominal lymph nodes, unchanged. There are prominent, but nonenlarged retroperitoneal lymph nodes and iliac chain lymph nodes, unchanged. Reproductive: Prostate gland is enlarged, unchanged. Other: There is marked fat stranding and small lymph nodes throughout the central mesentery similar to the prior study. Musculoskeletal: Degenerative changes affect the spine and hips. IMPRESSION: 1. No acute localizing  process in the abdomen or pelvis. 2. Large amount of stool throughout the colon compatible with constipation. 3. Stable findings of cirrhosis. 4. Grossly unchanged hepatic lesions/masses. 5. Stable lymphadenopathy in the upper abdomen and retroperitoneum. 6. 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 7. Left adrenal nodule is indeterminate, slightly larger when compared to 12/08/2021. Metastatic disease not excluded. Aortic Atherosclerosis (ICD10-I70.0). Electronically Signed   By: Ronney Asters M.D.   On: 01/28/2022 00:05    PERFORMANCE STATUS (ECOG) : 1 - Symptomatic but completely ambulatory  Review of Systems Unless otherwise noted, a complete review of systems is negative.  Physical Exam General: NAD Cardiovascular: regular rate and rhythm Pulmonary: clear ant fields Abdomen: soft, nontender, + bowel sounds GU: no suprapubic tenderness Extremities: no edema, no joint deformities Skin: no rashes Neurological: Weakness but otherwise nonfocal  IMPRESSION/PLAN: Elevated LFTs -MRCP concerning for biliary duct dilation of the left intrahepatic bile ducts.  Patient was not felt by GI to be admitted.  There was plan for referral to IR for possible PTCA.  However, labs today show improvement in transaminases and alk phos.  Bilirubin has normalized.  Discussed with Dr. Janese Frazier and will plan to recheck labs early next week and can consider IR referral if LFTs worsen or fail to improve.  Hepatocellular carcinoma -patient was able to relate to me a reasonable understanding of his recent workup.  He recognizes that he has an advanced malignancy that would require chemotherapy.  Patient states that his goals are aligned with pursuing cancer treatment and that he hopes to "live to 89", although he admits that this is an arbitrary number.  Patient recognizes that he cannot proceed with chemotherapy until complete resolution of his transaminitis.  Will have patient  return to clinic early next week for repeat labs and follow-up [facility] visit   Patient expressed understanding and was in agreement with this plan. He also understands that He can call clinic at any time with any questions, concerns, or complaints.   Thank you for allowing me to participate in the care of this very pleasant patient.   Time Total: 45 minutes  Visit consisted of counseling and education dealing with the complex and emotionally intense issues of symptom management in the setting of serious  illness.Greater than 50%  of this time was spent counseling and coordinating care related to the above assessment and plan.  Signed by: Altha Harm, PhD, NP-C

## 2022-02-13 NOTE — Telephone Encounter (Signed)
I have tried again today to reach the pt that he is going to need an appt in office for pre op clearance. Our office has tried x 3 to reach the pt. I will update the requesting office as well. I will remove from the pre op call back pool at this time.

## 2022-02-13 NOTE — Progress Notes (Signed)
Pt c/o itching- not relieved by hydroxyzine; patient c/o insomnia. Has been awake since 2-3 am today. Pt c/o urinary frequency/incontinence. He uses pull-ups to control incontinence. He had a nose bleed last evening. Dried blood noted on his lip and left nasal area. Pt lives by himself. He is able to complete all his adls and drive himself to all apts. His son lives next door.  Pt denies any thoughts of suicide on today's visit or hopelessness.

## 2022-02-14 ENCOUNTER — Other Ambulatory Visit: Payer: Self-pay | Admitting: *Deleted

## 2022-02-14 ENCOUNTER — Inpatient Hospital Stay: Payer: Medicare Other

## 2022-02-14 ENCOUNTER — Inpatient Hospital Stay: Payer: Medicare Other | Admitting: Hospice and Palliative Medicine

## 2022-02-14 ENCOUNTER — Other Ambulatory Visit: Payer: Self-pay

## 2022-02-14 DIAGNOSIS — R350 Frequency of micturition: Secondary | ICD-10-CM

## 2022-02-14 DIAGNOSIS — C220 Liver cell carcinoma: Secondary | ICD-10-CM

## 2022-02-14 LAB — URINALYSIS, COMPLETE (UACMP) WITH MICROSCOPIC
Bacteria, UA: NONE SEEN
Bilirubin Urine: NEGATIVE
Glucose, UA: NEGATIVE mg/dL
Hgb urine dipstick: NEGATIVE
Ketones, ur: NEGATIVE mg/dL
Leukocytes,Ua: NEGATIVE
Nitrite: NEGATIVE
Protein, ur: NEGATIVE mg/dL
Specific Gravity, Urine: 1.017 (ref 1.005–1.030)
pH: 5 (ref 5.0–8.0)

## 2022-02-16 LAB — URINE CULTURE

## 2022-02-17 ENCOUNTER — Encounter: Admission: RE | Payer: Self-pay | Source: Home / Self Care

## 2022-02-17 ENCOUNTER — Ambulatory Visit: Admission: RE | Admit: 2022-02-17 | Payer: Medicare Other | Source: Home / Self Care

## 2022-02-17 SURGERY — ESOPHAGOGASTRODUODENOSCOPY (EGD) WITH PROPOFOL
Anesthesia: General

## 2022-02-18 ENCOUNTER — Inpatient Hospital Stay: Payer: Medicare Other

## 2022-02-18 ENCOUNTER — Inpatient Hospital Stay (HOSPITAL_BASED_OUTPATIENT_CLINIC_OR_DEPARTMENT_OTHER): Payer: Medicare Other | Admitting: Hospice and Palliative Medicine

## 2022-02-18 ENCOUNTER — Encounter: Payer: Self-pay | Admitting: Hospice and Palliative Medicine

## 2022-02-18 ENCOUNTER — Other Ambulatory Visit: Payer: Self-pay

## 2022-02-18 ENCOUNTER — Other Ambulatory Visit: Payer: Self-pay | Admitting: *Deleted

## 2022-02-18 VITALS — BP 145/78 | HR 81 | Temp 97.2°F | Resp 20

## 2022-02-18 DIAGNOSIS — R7989 Other specified abnormal findings of blood chemistry: Secondary | ICD-10-CM | POA: Diagnosis not present

## 2022-02-18 DIAGNOSIS — C220 Liver cell carcinoma: Secondary | ICD-10-CM | POA: Diagnosis not present

## 2022-02-18 LAB — COMPREHENSIVE METABOLIC PANEL
ALT: 55 U/L — ABNORMAL HIGH (ref 0–44)
AST: 51 U/L — ABNORMAL HIGH (ref 15–41)
Albumin: 3.2 g/dL — ABNORMAL LOW (ref 3.5–5.0)
Alkaline Phosphatase: 345 U/L — ABNORMAL HIGH (ref 38–126)
Anion gap: 10 (ref 5–15)
BUN: 21 mg/dL (ref 8–23)
CO2: 28 mmol/L (ref 22–32)
Calcium: 8.9 mg/dL (ref 8.9–10.3)
Chloride: 96 mmol/L — ABNORMAL LOW (ref 98–111)
Creatinine, Ser: 1.21 mg/dL (ref 0.61–1.24)
GFR, Estimated: 56 mL/min — ABNORMAL LOW (ref >60)
Glucose, Bld: 219 mg/dL — ABNORMAL HIGH (ref 70–99)
Potassium: 4.7 mmol/L (ref 3.5–5.1)
Sodium: 134 mmol/L — ABNORMAL LOW (ref 135–145)
Total Bilirubin: 0.6 mg/dL (ref 0.3–1.2)
Total Protein: 7.9 g/dL (ref 6.5–8.1)

## 2022-02-18 LAB — CBC WITH DIFFERENTIAL/PLATELET
Abs Immature Granulocytes: 0.09 10*3/uL — ABNORMAL HIGH (ref 0.00–0.07)
Basophils Absolute: 0.1 10*3/uL (ref 0.0–0.1)
Basophils Relative: 1 %
Eosinophils Absolute: 0.3 10*3/uL (ref 0.0–0.5)
Eosinophils Relative: 2 %
HCT: 34.3 % — ABNORMAL LOW (ref 39.0–52.0)
Hemoglobin: 11.1 g/dL — ABNORMAL LOW (ref 13.0–17.0)
Immature Granulocytes: 1 %
Lymphocytes Relative: 46 %
Lymphs Abs: 8 10*3/uL — ABNORMAL HIGH (ref 0.7–4.0)
MCH: 27.2 pg (ref 26.0–34.0)
MCHC: 32.4 g/dL (ref 30.0–36.0)
MCV: 84.1 fL (ref 80.0–100.0)
Monocytes Absolute: 2.7 10*3/uL — ABNORMAL HIGH (ref 0.1–1.0)
Monocytes Relative: 16 %
Neutro Abs: 5.9 10*3/uL (ref 1.7–7.7)
Neutrophils Relative %: 34 %
Platelets: 226 10*3/uL (ref 150–400)
RBC: 4.08 MIL/uL — ABNORMAL LOW (ref 4.22–5.81)
RDW: 19.7 % — ABNORMAL HIGH (ref 11.5–15.5)
Smear Review: ADEQUATE
WBC: 17 10*3/uL — ABNORMAL HIGH (ref 4.0–10.5)
nRBC: 0 % (ref 0.0–0.2)

## 2022-02-18 NOTE — Progress Notes (Signed)
Symptom Management [place] at [facility] [phone] Fax:([phone]  Patient Care Team: Pcp, No as PCP - General Randy Frazier, Randy November, MD as PCP - Cardiology (Cardiology) Randy Merritts, MD as Consulting Physician (Cardiology) Randy Jacks, RN as Oncology Nurse Navigator   NAME OF PATIENT: Randy Frazier  [id number]  [birth year]   DATE OF VISIT: 02/18/22  REASON FOR CONSULT: Randy Frazier is a 89 y.o. male with multiple medical problems including CAD status post CABG, diabetes, history of CVA, stage IIIa multifocal hepatocellular carcinoma.  Patient was found to have a hepatic mass on CT/MRI in October 2023 after evaluation for abdominal pain.  He was subsequently hospitalized in [place] 2023 with sepsis secondary to acute cholecystitis.  He is status post cholecystectomy and liver biopsy.    INTERVAL HISTORY: Patient with recent transaminitis and hyperbilirubinemia.  He was seen in clinic on 02/11/2022 by Dr. Janese Frazier and underwent stat MRCP with findings of interval development of mild intrahepatic biliary duct dilation mainly isolated to the lateral segment of the left liver with abrupt transition centrally near the confluence of the left intrahepatic bile ducts.  No evidence of choledocholithiasis.  There was a new small rim-enhancing lesion anterior to the lateral segment of the left liver possibly reflecting interval development of tumor extension.  Unclear if GI was consulted and patient was felt unlikely to be amenable to stenting.  IR was consulted for percutaneous transhepatic cholangiography.   Last week patient was seen in clinic with declining LFTs.  Decision was made to monitor labs prior to pursuing further interventional options.  He presents back today for repeat labs.  Today, patient reports that he is feeling good.  He denies any symptomatic changes or concerns.  He reports that he is now sleeping well and says that  his pruritus has resolved.  Denies any neurologic complaints. Denies recent fevers or illnesses. Denies any easy bleeding or bruising. Reports good appetite and denies weight loss. Denies chest pain. Denies any nausea, vomiting, constipation, or diarrhea. Denies urinary complaints. Patient offers no further specific complaints today.   PAST MEDICAL HISTORY: Past Medical History:  Diagnosis Date   Actinic keratosis    Arrhythmia    A-Fib   BPH (benign prostatic hyperplasia)    Coronary artery disease 02/11/2007   CABG X 3  @ [facility]   CVA (cerebral infarction) 02/11/2012   Diabetes mellitus without complication (HCC)    Enlarged prostate    GERD (gastroesophageal reflux disease)    Hyperlipidemia    Hypertension    Kidney stone    Renal disorder    Skin cancer    scalp, tx by Dr Sharlett Iles 2000s?    PAST SURGICAL HISTORY:  Past Surgical History:  Procedure Laterality Date   cataract surgery Bilateral    CORONARY ARTERY BYPASS GRAFT  2009   CABG X 3 @ [facility]   LIVER BIOPSY  01/06/2022   Procedure: LIVER BIOPSY;  Surgeon: Ronny Bacon, MD;  Location: [facility] ORS;  Service: General;;   TEAR DUCT PROBING Right 05/07/2016   Procedure: Evacuation of hematoma, right peri-orbital bleed.;  Surgeon: Clista Bernhardt, MD;  Location: [place];  Service: Ophthalmology;  Laterality: Right;    HEMATOLOGY/ONCOLOGY HISTORY:  Oncology History  Hepatocellular carcinoma (Randy [place])  01/25/2022 Initial Diagnosis   Hepatocellular carcinoma ([place])   01/25/2022 Cancer Staging   Staging form: Liver, AJCC 8th Edition - Clinical: Stage IIIA (cT3, cN0, cM0) - Signed by Sindy Guadeloupe,  MD on 01/25/2022   01/29/2022 -  Chemotherapy   Patient is on Treatment Plan : [place] Atezolizumab q21d       ALLERGIES:  is allergic to ibuprofen and penicillins.  MEDICATIONS:  Current Outpatient Medications  Medication Sig Dispense Refill   amLODipine (NORVASC) 10 MG tablet TAKE 1 TABLET BY MOUTH EVERY DAY 30 tablet 6    aspirin EC 81 MG tablet Take 81 mg by mouth.     atorvastatin (LIPITOR) 10 MG tablet Take 1 tablet (10 mg total) by mouth every other day. 90 tablet 1   clopidogrel (PLAVIX) 75 MG tablet TAKE 1 TABLET(75 MG) BY MOUTH DAILY 90 tablet 0   ezetimibe (ZETIA) 10 MG tablet Take 10 mg by mouth daily.     FLUoxetine (PROZAC) 20 MG capsule Take 20 mg by mouth daily.     gabapentin (NEURONTIN) 300 MG capsule Take 300 mg by mouth 3 (three) times daily.      hydrOXYzine (ATARAX) 10 MG tablet Take 1 tablet (10 mg total) by mouth 3 (three) times daily as needed for itching. 20 tablet 0   insulin detemir (LEVEMIR) 100 UNIT/ML injection Inject 0.16 mLs (16 Units total) into the skin daily. (Patient not taking: Reported on 01/24/2022) 10 mL 0   lidocaine-prilocaine (EMLA) cream Apply to affected area once 30 g 3   lisinopril (PRINIVIL,ZESTRIL) 20 MG tablet Take 20 mg by mouth daily.      Magnesium 500 MG TABS Take 500 mg by mouth daily.     metoprolol tartrate (LOPRESSOR) 25 MG tablet Take 25 mg by mouth 2 (two) times daily.     nystatin (MYCOSTATIN) 100000 UNIT/ML suspension Take 5 mLs by mouth 4 (four) times daily.     ondansetron (ZOFRAN) 8 MG tablet Take 1 tablet (8 mg total) by mouth every 8 (eight) hours as needed for nausea or vomiting. 30 tablet 1   oxyCODONE (OXY IR/ROXICODONE) 5 MG immediate release tablet Take 1 tablet (5 mg total) by mouth every 4 (four) hours as needed for moderate pain. 12 tablet 0   pantoprazole (PROTONIX) 40 MG tablet Take 1 tablet (40 mg total) by mouth daily. 30 tablet 0   prochlorperazine (COMPAZINE) 10 MG tablet Take 1 tablet (10 mg total) by mouth every 6 (six) hours as needed for nausea or vomiting. 30 tablet 1   traZODone (DESYREL) 50 MG tablet Take 1 tablet (50 mg total) by mouth at bedtime. (Patient not taking: Reported on 02/13/2022) 5 tablet 0   No current facility-administered medications for this visit.    VITAL SIGNS: There were no vitals taken for this  visit. There were no vitals filed for this visit.  Estimated body mass index is 23.1 kg/m as calculated from the following:   Height as of 02/10/22: '5\' 10"'$  (1.778 m).   Weight as of 02/11/22: 161 lb (73 kg).  LABS: CBC:    Component Value Date/Time   WBC 21.8 (H) 02/13/2022 1209   HGB 11.2 (L) 02/13/2022 1209   HGB 14.6 03/30/2013 0511   HCT 34.0 (L) 02/13/2022 1209   HCT 43.2 03/30/2013 0511   PLT 230 02/13/2022 1209   PLT 190 03/30/2013 0511   MCV 81.9 02/13/2022 1209   MCV 93 03/30/2013 0511   NEUTROABS 6.3 02/13/2022 1209   NEUTROABS 7.9 (H) 03/30/2013 0511   LYMPHSABS 10.8 (H) 02/13/2022 1209   LYMPHSABS 0.3 (L) 03/30/2013 0511   MONOABS 4.0 (H) 02/13/2022 1209   MONOABS 0.3 03/30/2013 0511  EOSABS 0.5 02/13/2022 1209   EOSABS 0.0 03/30/2013 0511   BASOSABS 0.1 02/13/2022 1209   BASOSABS 0.0 03/30/2013 0511   Comprehensive Metabolic Panel:    Component Value Date/Time   NA 135 02/13/2022 1209   NA 135 (L) 03/30/2013 0511   K 4.6 02/13/2022 1209   K 4.3 03/30/2013 0511   CL 97 (L) 02/13/2022 1209   CL 103 03/30/2013 0511   CO2 27 02/13/2022 1209   CO2 25 03/30/2013 0511   BUN 23 02/13/2022 1209   BUN 20 (H) 03/30/2013 0511   CREATININE 1.20 02/13/2022 1209   CREATININE 1.54 (H) 03/30/2013 0511   CREATININE 1.23 06/26/2010 0851   GLUCOSE 132 (H) 02/13/2022 1209   GLUCOSE 219 (H) 03/30/2013 0511   CALCIUM 8.9 02/13/2022 1209   CALCIUM 8.5 03/30/2013 0511   AST 83 (H) 02/13/2022 1209   AST 21 03/30/2013 0511   ALT 139 (H) 02/13/2022 1209   ALT 35 03/30/2013 0511   ALKPHOS 451 (H) 02/13/2022 1209   ALKPHOS 75 03/30/2013 0511   BILITOT 1.1 02/13/2022 1209   BILITOT 0.5 03/30/2013 0511   PROT 8.9 (H) 02/13/2022 1209   PROT 7.7 03/30/2013 0511   ALBUMIN 3.5 02/13/2022 1209   ALBUMIN 3.7 03/30/2013 0511    RADIOGRAPHIC STUDIES: MR ABDOMEN MRCP W WO CONTAST  Result Date: 02/12/2022 CLINICAL DATA:  Biopsy-proven hepatocellular carcinoma. Now with abnormal  LFTs. EXAM: MRI ABDOMEN WITHOUT AND WITH CONTRAST (INCLUDING MRCP) TECHNIQUE: Multiplanar multisequence MR imaging of the abdomen was performed both before and after the administration of intravenous contrast. Heavily T2-weighted images of the biliary and pancreatic ducts were obtained, and three-dimensional MRCP images were rendered by post processing. CONTRAST:  54m GADAVIST GADOBUTROL 1 MMOL/ML IV SOLN COMPARISON:  CT scan 01/27/2022.  MRI 12/19/2021 FINDINGS: Lower chest: Unremarkable. Hepatobiliary: Motion degraded assessment of the liver shows irregular contour with nodular parenchyma compatible with patient's known cirrhosis. Dominant lesion in the dome of the lateral segment left liver measures 6.5 x 4.4 cm today which compares to 6.1 x 3.8 cm on previous MRI. As before, there are numerous tiny foci of arterial phase hyperenhancement, slightly less conspicuous on today's study but persistent. Interval cholecystectomy. Interval development of mild intrahepatic biliary duct dilatation mainly isolated to the lateral segment left liver with abrupt transition centrally near the confluence of the left intrahepatic bile ducts (see coronal T2 image 24/2 and axial T2 image 15/3). There is no evidence for ductal wall thickening or ductal enhancement in the lateral segment left liver. High T2 signal within the mildly dilated bile ducts is preserved. There is some minimal intrahepatic biliary duct prominence in the medial segment left liver. No substantial biliary duct dilatation in the right liver. No evidence for choledocholithiasis. Common duct measures 6 mm diameter in the porta hepatis with 6 mm common bile duct diameter in the head of the pancreas just proximal to the ampulla. Pancreas: No focal mass lesion. No dilatation of the main duct. No intraparenchymal cyst. No peripancreatic edema. Spleen:  No splenomegaly. No focal mass lesion. Adrenals/Urinary Tract: Slight increase in size of left adrenal nodule now 1.5  cm compared to 1.1 cm previously. Bilateral renal cysts again noted. No followup imaging is recommended. Stomach/Bowel: Moderate distention of the stomach with food and fluid. Duodenum is normally positioned as is the ligament of Treitz. No small bowel or colonic dilatation within the visualized abdomen. Vascular/Lymphatic: No abdominal aortic aneurysm. Mild lymphadenopathy in the porta hepatis is similar to prior. Main  and right portal vein remain patent. Left portal vein not clearly visualized throughout its course consistent with tumor involvement as noted previously. Other:  No substantial ascites. Musculoskeletal: No worrisome lytic or sclerotic osseous abnormality. IMPRESSION: 1. Interval development of mild intrahepatic biliary duct dilatation mainly isolated to the lateral segment left liver with abrupt transition centrally near the confluence of the left intrahepatic bile ducts. No evidence for ductal wall thickening or ductal enhancement in the lateral segment left liver. No filling defects identified within the mildly dilated left hepatic ducts. No overt MR features of ascending cholangitis. 2. No substantial biliary duct dilatation in the right liver. No evidence for choledocholithiasis. 3. Stable appearance of the dominant lesion in the dome of the lateral segment left liver. There is a new small rim enhancing lesion anterior to the lateral segment left liver, potentially reflecting interval development of tumor extension. As before, there are numerous tiny foci of arterial phase hyperenhancement, slightly less conspicuous on today's study but persistent. 4. Left portal vein not clearly visualized throughout its course consistent with tumor involvement as noted previously. 5. Mild lymphadenopathy in the porta hepatis is similar to prior. 6. Slight increase in size of left adrenal nodule now 1.5 cm compared to 1.1 cm previously. Electronically Signed   By: Misty Stanley M.D.   On: 02/12/2022 04:26   MR  3D Recon At Scanner  Result Date: 02/12/2022 CLINICAL DATA:  Biopsy-proven hepatocellular carcinoma. Now with abnormal LFTs. EXAM: MRI ABDOMEN WITHOUT AND WITH CONTRAST (INCLUDING MRCP) TECHNIQUE: Multiplanar multisequence MR imaging of the abdomen was performed both before and after the administration of intravenous contrast. Heavily T2-weighted images of the biliary and pancreatic ducts were obtained, and three-dimensional MRCP images were rendered by post processing. CONTRAST:  42m GADAVIST GADOBUTROL 1 MMOL/ML IV SOLN COMPARISON:  CT scan 01/27/2022.  MRI 12/19/2021 FINDINGS: Lower chest: Unremarkable. Hepatobiliary: Motion degraded assessment of the liver shows irregular contour with nodular parenchyma compatible with patient's known cirrhosis. Dominant lesion in the dome of the lateral segment left liver measures 6.5 x 4.4 cm today which compares to 6.1 x 3.8 cm on previous MRI. As before, there are numerous tiny foci of arterial phase hyperenhancement, slightly less conspicuous on today's study but persistent. Interval cholecystectomy. Interval development of mild intrahepatic biliary duct dilatation mainly isolated to the lateral segment left liver with abrupt transition centrally near the confluence of the left intrahepatic bile ducts (see coronal T2 image 24/2 and axial T2 image 15/3). There is no evidence for ductal wall thickening or ductal enhancement in the lateral segment left liver. High T2 signal within the mildly dilated bile ducts is preserved. There is some minimal intrahepatic biliary duct prominence in the medial segment left liver. No substantial biliary duct dilatation in the right liver. No evidence for choledocholithiasis. Common duct measures 6 mm diameter in the porta hepatis with 6 mm common bile duct diameter in the head of the pancreas just proximal to the ampulla. Pancreas: No focal mass lesion. No dilatation of the main duct. No intraparenchymal cyst. No peripancreatic edema.  Spleen:  No splenomegaly. No focal mass lesion. Adrenals/Urinary Tract: Slight increase in size of left adrenal nodule now 1.5 cm compared to 1.1 cm previously. Bilateral renal cysts again noted. No followup imaging is recommended. Stomach/Bowel: Moderate distention of the stomach with food and fluid. Duodenum is normally positioned as is the ligament of Treitz. No small bowel or colonic dilatation within the visualized abdomen. Vascular/Lymphatic: No abdominal aortic aneurysm. Mild lymphadenopathy in  the porta hepatis is similar to prior. Main and right portal vein remain patent. Left portal vein not clearly visualized throughout its course consistent with tumor involvement as noted previously. Other:  No substantial ascites. Musculoskeletal: No worrisome lytic or sclerotic osseous abnormality. IMPRESSION: 1. Interval development of mild intrahepatic biliary duct dilatation mainly isolated to the lateral segment left liver with abrupt transition centrally near the confluence of the left intrahepatic bile ducts. No evidence for ductal wall thickening or ductal enhancement in the lateral segment left liver. No filling defects identified within the mildly dilated left hepatic ducts. No overt MR features of ascending cholangitis. 2. No substantial biliary duct dilatation in the right liver. No evidence for choledocholithiasis. 3. Stable appearance of the dominant lesion in the dome of the lateral segment left liver. There is a new small rim enhancing lesion anterior to the lateral segment left liver, potentially reflecting interval development of tumor extension. As before, there are numerous tiny foci of arterial phase hyperenhancement, slightly less conspicuous on today's study but persistent. 4. Left portal vein not clearly visualized throughout its course consistent with tumor involvement as noted previously. 5. Mild lymphadenopathy in the porta hepatis is similar to prior. 6. Slight increase in size of left  adrenal nodule now 1.5 cm compared to 1.1 cm previously. Electronically Signed   By: Misty Stanley M.D.   On: 02/12/2022 04:26   US Abdomen Limited RUQ (LIVER/GB)  Result Date: 02/10/2022 CLINICAL DATA:  89 year old male with elevated LFTs. Laparoscopic cholecystectomy 4 weeks ago. EXAM: ULTRASOUND ABDOMEN LIMITED RIGHT UPPER QUADRANT COMPARISON:  01/27/2022 CT and prior studies FINDINGS: Gallbladder: Not visualized compatible with cholecystectomy. No abnormality identified in the cholecystectomy bed. Common bile duct: Diameter: 5 mm. There is no evidence of intrahepatic or extrahepatic biliary dilatation. Liver: Cirrhosis changes are again identified. Heterogeneous hepatic echotexture noted. A 5.1 x 4 x 3.8 cm LEFT hepatic mass does not appear significantly changed given technical differences. Portal vein is patent on color Doppler imaging with normal direction of blood flow towards the liver. Other: None. IMPRESSION: 1. Cirrhosis. No significant change in LEFT liver mass given technical differences. 2. No biliary dilatation. 3. Status post cholecystectomy. No abnormality identified in the cholecystectomy bed. Electronically Signed   By: Margarette Canada M.D.   On: 02/10/2022 10:35   CT ABDOMEN PELVIS WO CONTRAST  Result Date: 01/28/2022 CLINICAL DATA:  Diarrhea EXAM: CT ABDOMEN AND PELVIS WITHOUT CONTRAST TECHNIQUE: Multidetector CT imaging of the abdomen and pelvis was performed following the standard protocol without IV contrast. RADIATION DOSE REDUCTION: This exam was performed according to the departmental dose-optimization program which includes automated exposure control, adjustment of the mA and/or kV according to patient size and/or use of iterative reconstruction technique. COMPARISON:  CT abdomen and pelvis 12/08/2021. MRI abdomen 12/19/2021. FINDINGS: Lower chest: No acute abnormality. Hepatobiliary: Liver is enlarged with nodular liver contour, unchanged. Previously identified left hepatic mass is  grossly unchanged but not well defined on this noncontrast study measuring 4.3 x 6.6 cm image 2/7. Scattered other smaller hypodense nodular areas correspond to MRI findings are grossly unchanged given lack of intravenous contrast. The gallbladder is not visualized. There is no biliary ductal dilatation. Pancreas: Unremarkable. No pancreatic ductal dilatation or surrounding inflammatory changes. Spleen: Normal in size without focal abnormality. Adrenals/Urinary Tract: Bilateral renal cysts measuring up to 4.8 cm on the left and 8.9 cm on the right appear unchanged. The kidneys are otherwise within normal limits. Bladder is within normal limits. Left adrenal nodule  is rounded measuring 14 mm is indeterminate and slightly larger when compared to 12/08/2021. The right adrenal gland is within normal limits. Stomach/Bowel: Stomach is within normal limits. Appendix appears normal. No evidence of bowel wall thickening, distention, or inflammatory changes. There is sigmoid colon diverticulosis. There is a large amount of stool throughout the colon. Vascular/Lymphatic: Aortic atherosclerosis. There is an enlarged portacaval lymph node measuring 1.6 x 3.1 cm, unchanged. Are additional scattered prominent upper abdominal lymph nodes, unchanged. There are prominent, but nonenlarged retroperitoneal lymph nodes and iliac chain lymph nodes, unchanged. Reproductive: Prostate gland is enlarged, unchanged. Other: There is marked fat stranding and small lymph nodes throughout the central mesentery similar to the prior study. Musculoskeletal: Degenerative changes affect the spine and hips. IMPRESSION: 1. No acute localizing process in the abdomen or pelvis. 2. Large amount of stool throughout the colon compatible with constipation. 3. Stable findings of cirrhosis. 4. Grossly unchanged hepatic lesions/masses. 5. Stable lymphadenopathy in the upper abdomen and retroperitoneum. 6. 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 7. Left adrenal nodule is indeterminate, slightly larger when compared to 12/08/2021. Metastatic disease not excluded. Aortic Atherosclerosis (ICD10-I70.0). Electronically Signed   By: Ronney Asters M.D.   On: 01/28/2022 00:05    PERFORMANCE STATUS (ECOG) : 1 - Symptomatic but completely ambulatory  Review of Systems Unless otherwise noted, a complete review of systems is negative.  Physical Exam General: NAD Cardiovascular: regular rate and rhythm Pulmonary: clear ant fields Abdomen: soft, nontender, + bowel sounds GU: no suprapubic tenderness Extremities: no edema, no joint deformities Skin: no rashes Neurological: Weakness but otherwise nonfocal  IMPRESSION/PLAN: Transaminitis -LFTs are improving.  Will continue to provide supportive care with plan for follow-up labs next week.  Patient is interested in proceeding with port placement with plan for chemotherapy once LFTs have normalized.  Case and plan discussed with Dr. Janese Frazier   Patient expressed understanding and was in agreement with this plan. He also understands that He can call clinic at any time with any questions, concerns, or complaints.   Thank you for allowing me to participate in the care of this very pleasant patient.   Time Total: 20 minutes  Visit consisted of counseling and education dealing with the complex and emotionally intense issues of symptom management in the setting of serious illness.Greater than 50%  of this time was spent counseling and coordinating care related to the above assessment and plan.  Signed by: Altha Harm, PhD, NP-C

## 2022-02-18 NOTE — Progress Notes (Signed)
Patient reports that his insomnia has improved.

## 2022-02-19 ENCOUNTER — Ambulatory Visit: Payer: Medicare Other | Admitting: Oncology

## 2022-02-19 ENCOUNTER — Other Ambulatory Visit: Payer: Medicare Other

## 2022-02-19 ENCOUNTER — Ambulatory Visit: Payer: Medicare Other

## 2022-02-20 LAB — COMP PANEL: LEUKEMIA/LYMPHOMA: Immunophenotypic Profile: 49

## 2022-02-21 LAB — BCR-ABL1 FISH
Cells Analyzed: 200
Cells Counted: 200

## 2022-02-26 ENCOUNTER — Other Ambulatory Visit: Payer: Medicare Other

## 2022-02-26 ENCOUNTER — Ambulatory Visit: Payer: Medicare Other | Admitting: Internal Medicine

## 2022-02-26 ENCOUNTER — Ambulatory Visit: Payer: Medicare Other

## 2022-02-27 ENCOUNTER — Inpatient Hospital Stay: Payer: Medicare Other

## 2022-02-27 ENCOUNTER — Telehealth: Payer: Self-pay | Admitting: Oncology

## 2022-02-27 ENCOUNTER — Inpatient Hospital Stay: Payer: Medicare Other | Admitting: Oncology

## 2022-02-27 ENCOUNTER — Telehealth: Payer: Self-pay | Admitting: *Deleted

## 2022-02-27 NOTE — Telephone Encounter (Signed)
The pt. Was not her today to start chemo. Anderson Malta called the son and he said hid Dad might have forgotten or he was not going to do it until he had port. He would check with his Dad. We did not hear back so I called the son and the father. Both of them did not answer with the call. So I left message to son to please call me with if he wants ts. And does he want port put in. Then I called pt. And said if he was still interested to get chemo and does he need his first one with port or try to port in before his second chemo. I left my telephone number to both tothem

## 2022-02-27 NOTE — Telephone Encounter (Signed)
Patient has not shown for her 8:30 appointment today. Left VM to check-in.

## 2022-02-28 ENCOUNTER — Telehealth: Payer: Self-pay | Admitting: Oncology

## 2022-02-28 NOTE — Telephone Encounter (Signed)
Vm left with patient in regards to rescheduling missed appointments. Left direct line to return call.

## 2022-03-04 ENCOUNTER — Telehealth: Payer: Self-pay | Admitting: *Deleted

## 2022-03-04 NOTE — Telephone Encounter (Signed)
I called both the pt. And his son and left message that the tx  and wondered if he wants the port put in. I know they were  going to let him  have first one and then if he felt he did good with the infusion can get port. Or does he want  to have port first. His appt is 2/9 for chemo and we can try to move it up if ok with pt The son called me back before I finished by note and said that he will talk to his dad tonight and ask the pt to call me and let me know

## 2022-03-06 ENCOUNTER — Other Ambulatory Visit
Admission: RE | Admit: 2022-03-06 | Discharge: 2022-03-06 | Disposition: A | Discharge status: Home / Self Care | Payer: Medicare Other | Source: Ambulatory Visit | Attending: Sports Medicine | Admitting: Sports Medicine

## 2022-03-06 DIAGNOSIS — M25522 Pain in left elbow: Secondary | ICD-10-CM | POA: Insufficient documentation

## 2022-03-06 DIAGNOSIS — M7022 Olecranon bursitis, left elbow: Secondary | ICD-10-CM | POA: Insufficient documentation

## 2022-03-06 DIAGNOSIS — M25422 Effusion, left elbow: Secondary | ICD-10-CM | POA: Insufficient documentation

## 2022-03-06 LAB — SYNOVIAL CELL COUNT + DIFF, W/ CRYSTALS
Crystals, Fluid: NONE SEEN
Eosinophils-Synovial: 2 % — ABNORMAL HIGH (ref 0–1)
Lymphocytes-Synovial Fld: 15 % (ref 0–20)
Monocyte-Macrophage-Synovial Fluid: 15 % — ABNORMAL LOW (ref 50–90)
Neutrophil, Synovial: 68 % — ABNORMAL HIGH (ref 0–25)
WBC, Synovial: 3228 /mm3 — ABNORMAL HIGH (ref 0–200)

## 2022-03-11 ENCOUNTER — Other Ambulatory Visit: Payer: Self-pay

## 2022-03-12 MED ORDER — HYDROXYZINE HCL 10 MG PO TABS: 10.0000 mg | ORAL_TABLET | Freq: Three times a day (TID) | ORAL | 0 refills | Status: DC | PRN

## 2022-03-21 ENCOUNTER — Inpatient Hospital Stay: Payer: Medicare Other | Admitting: Oncology

## 2022-03-21 ENCOUNTER — Inpatient Hospital Stay: Payer: Medicare Other

## 2022-03-21 ENCOUNTER — Telehealth: Payer: Self-pay | Admitting: Oncology

## 2022-03-21 ENCOUNTER — Telehealth: Payer: Self-pay | Admitting: *Deleted

## 2022-03-21 ENCOUNTER — Inpatient Hospital Stay: Payer: Medicare Other | Attending: Oncology

## 2022-03-21 DIAGNOSIS — Z85828 Personal history of other malignant neoplasm of skin: Secondary | ICD-10-CM | POA: Insufficient documentation

## 2022-03-21 DIAGNOSIS — E278 Other specified disorders of adrenal gland: Secondary | ICD-10-CM | POA: Insufficient documentation

## 2022-03-21 DIAGNOSIS — R634 Abnormal weight loss: Secondary | ICD-10-CM | POA: Insufficient documentation

## 2022-03-21 DIAGNOSIS — D649 Anemia, unspecified: Secondary | ICD-10-CM | POA: Insufficient documentation

## 2022-03-21 DIAGNOSIS — L299 Pruritus, unspecified: Secondary | ICD-10-CM | POA: Insufficient documentation

## 2022-03-21 DIAGNOSIS — R7989 Other specified abnormal findings of blood chemistry: Secondary | ICD-10-CM | POA: Insufficient documentation

## 2022-03-21 DIAGNOSIS — E119 Type 2 diabetes mellitus without complications: Secondary | ICD-10-CM | POA: Insufficient documentation

## 2022-03-21 DIAGNOSIS — C220 Liver cell carcinoma: Secondary | ICD-10-CM | POA: Insufficient documentation

## 2022-03-21 DIAGNOSIS — I10 Essential (primary) hypertension: Secondary | ICD-10-CM | POA: Insufficient documentation

## 2022-03-21 DIAGNOSIS — C9110 Chronic lymphocytic leukemia of B-cell type not having achieved remission: Secondary | ICD-10-CM | POA: Insufficient documentation

## 2022-03-21 DIAGNOSIS — K7460 Unspecified cirrhosis of liver: Secondary | ICD-10-CM | POA: Insufficient documentation

## 2022-03-21 NOTE — Telephone Encounter (Signed)
Spoke to Randy Frazier on the phone. He didn't realize that today is the 9th. He also said that he's been so sick from his gall Bladder that he doesn't have the strength for chemo right now. I told him we could call back and check on him next week. Wasn't sure that rescheduling right now would work, he seemed pretty adamant that he was in no shape for chemo.

## 2022-03-21 NOTE — Telephone Encounter (Signed)
Called the son to see if pt ready to get treatment . Son says that he is having some issues since his surgery, he has been tired and he has thrush.. he is seeing his PCP. He will talk to his dad and have him call but son thinks he wants to get the tx

## 2022-03-24 ENCOUNTER — Telehealth: Payer: Self-pay | Admitting: Oncology

## 2022-03-24 ENCOUNTER — Telehealth: Payer: Self-pay | Admitting: *Deleted

## 2022-03-24 ENCOUNTER — Inpatient Hospital Stay (HOSPITAL_BASED_OUTPATIENT_CLINIC_OR_DEPARTMENT_OTHER): Payer: Medicare Other | Admitting: Nurse Practitioner

## 2022-03-24 ENCOUNTER — Inpatient Hospital Stay: Payer: Medicare Other

## 2022-03-24 VITALS — BP 160/73 | HR 95 | Temp 97.5°F | Resp 20 | Ht 72.0 in | Wt 157.7 lb

## 2022-03-24 DIAGNOSIS — Z85828 Personal history of other malignant neoplasm of skin: Secondary | ICD-10-CM | POA: Diagnosis not present

## 2022-03-24 DIAGNOSIS — E781 Pure hyperglyceridemia: Secondary | ICD-10-CM | POA: Insufficient documentation

## 2022-03-24 DIAGNOSIS — C9110 Chronic lymphocytic leukemia of B-cell type not having achieved remission: Secondary | ICD-10-CM | POA: Diagnosis not present

## 2022-03-24 DIAGNOSIS — K7460 Unspecified cirrhosis of liver: Secondary | ICD-10-CM

## 2022-03-24 DIAGNOSIS — Z7189 Other specified counseling: Secondary | ICD-10-CM | POA: Diagnosis not present

## 2022-03-24 DIAGNOSIS — E278 Other specified disorders of adrenal gland: Secondary | ICD-10-CM | POA: Diagnosis not present

## 2022-03-24 DIAGNOSIS — C220 Liver cell carcinoma: Secondary | ICD-10-CM

## 2022-03-24 DIAGNOSIS — D649 Anemia, unspecified: Secondary | ICD-10-CM | POA: Diagnosis not present

## 2022-03-24 DIAGNOSIS — L299 Pruritus, unspecified: Secondary | ICD-10-CM

## 2022-03-24 DIAGNOSIS — K219 Gastro-esophageal reflux disease without esophagitis: Secondary | ICD-10-CM | POA: Insufficient documentation

## 2022-03-24 DIAGNOSIS — R7989 Other specified abnormal findings of blood chemistry: Secondary | ICD-10-CM | POA: Diagnosis not present

## 2022-03-24 DIAGNOSIS — R634 Abnormal weight loss: Secondary | ICD-10-CM | POA: Diagnosis not present

## 2022-03-24 DIAGNOSIS — E119 Type 2 diabetes mellitus without complications: Secondary | ICD-10-CM | POA: Diagnosis not present

## 2022-03-24 DIAGNOSIS — I10 Essential (primary) hypertension: Secondary | ICD-10-CM | POA: Diagnosis not present

## 2022-03-24 LAB — COMPREHENSIVE METABOLIC PANEL
ALT: 147 U/L — ABNORMAL HIGH (ref 0–44)
AST: 137 U/L — ABNORMAL HIGH (ref 15–41)
Albumin: 3.4 g/dL — ABNORMAL LOW (ref 3.5–5.0)
Alkaline Phosphatase: 497 U/L — ABNORMAL HIGH (ref 38–126)
Anion gap: 13 (ref 5–15)
BUN: 30 mg/dL — ABNORMAL HIGH (ref 8–23)
CO2: 24 mmol/L (ref 22–32)
Calcium: 9.4 mg/dL (ref 8.9–10.3)
Chloride: 96 mmol/L — ABNORMAL LOW (ref 98–111)
Creatinine, Ser: 1.15 mg/dL (ref 0.61–1.24)
GFR, Estimated: 60 mL/min — ABNORMAL LOW (ref >60)
Glucose, Bld: 165 mg/dL — ABNORMAL HIGH (ref 70–99)
Potassium: 4.4 mmol/L (ref 3.5–5.1)
Sodium: 133 mmol/L — ABNORMAL LOW (ref 135–145)
Total Bilirubin: 1.2 mg/dL (ref 0.3–1.2)
Total Protein: 9 g/dL — ABNORMAL HIGH (ref 6.5–8.1)

## 2022-03-24 LAB — CBC WITH DIFFERENTIAL/PLATELET
Abs Immature Granulocytes: 0.1 10*3/uL — ABNORMAL HIGH (ref 0.00–0.07)
Basophils Absolute: 0.1 10*3/uL (ref 0.0–0.1)
Basophils Relative: 1 %
Eosinophils Absolute: 0.5 10*3/uL (ref 0.0–0.5)
Eosinophils Relative: 3 %
HCT: 34.4 % — ABNORMAL LOW (ref 39.0–52.0)
Hemoglobin: 10.9 g/dL — ABNORMAL LOW (ref 13.0–17.0)
Immature Granulocytes: 1 %
Lymphocytes Relative: 39 %
Lymphs Abs: 6.1 10*3/uL — ABNORMAL HIGH (ref 0.7–4.0)
MCH: 26.1 pg (ref 26.0–34.0)
MCHC: 31.7 g/dL (ref 30.0–36.0)
MCV: 82.3 fL (ref 80.0–100.0)
Monocytes Absolute: 3.3 10*3/uL — ABNORMAL HIGH (ref 0.1–1.0)
Monocytes Relative: 22 %
Neutro Abs: 5.2 10*3/uL (ref 1.7–7.7)
Neutrophils Relative %: 34 %
Platelets: 170 10*3/uL (ref 150–400)
RBC: 4.18 MIL/uL — ABNORMAL LOW (ref 4.22–5.81)
RDW: 21.4 % — ABNORMAL HIGH (ref 11.5–15.5)
Smear Review: NORMAL
WBC Morphology: ABNORMAL
WBC: 15.3 10*3/uL — ABNORMAL HIGH (ref 4.0–10.5)
nRBC: 0 % (ref 0.0–0.2)

## 2022-03-24 MED ORDER — HYDROXYZINE HCL 10 MG PO TABS: 10.0000 mg | ORAL_TABLET | Freq: Every day | ORAL | 0 refills | Status: DC | PRN

## 2022-03-24 NOTE — Progress Notes (Signed)
Patient here for labs/follow up. His only complaints are itching and nausea.

## 2022-03-24 NOTE — Progress Notes (Signed)
Sympt[facility] [place] at [place]. [facility] [address] [place], [place] [address] [phone] (phone) [phone] (fax)  Patient Care Team: Pcp, No as PCP - General Rockey Situ, Kathlene November, MD as PCP - Cardiology (Cardiology) Minna Merritts, MD as Consulting Physician (Cardiology) Clent Jacks, RN as Oncology Nurse Navigator Sindy Guadeloupe, MD as Consulting Physician (Oncology) Borders, Kirt Boys, NP as Nurse Practitioner [facility] and Palliative Medicine)   Name of the patient: Randy Frazier  [id number]  December 03, 1927   Date of visit: 03/24/22  Diagnosis- Hepatocellular Carcinoma  Chief complaint/ Reason for visit- weight loss & itching  Heme/Onc history:  Oncology History  Hepatocellular carcinoma ([place])  01/25/2022 Initial Diagnosis   Hepatocellular carcinoma ([place])   01/25/2022 Cancer Staging   Staging form: Liver, AJCC 8th Edition - Clinical: Stage IIIA (cT3, cN0, cM0) - Signed by Sindy Guadeloupe, MD on 01/25/2022   01/29/2022 -  Chemotherapy   Patient is on Treatment Plan : [place] Atezolizumab q21d       Interval history- Patient is 89 year old male diagnosed with [place] who presents to [facility] for complaints of itching and weight loss. Appears this is an ongoing problem for patient. He is mixed historian and perseverates on pieces of his life and history. I'm able to determine that he has ongoing all over itching that is not a new problem. He was previously prescribed hydroxyzine for this which helps but appears that he may be taking it intermittently. He seems worried that itching is related to his liver problems. Symptoms are not worsening or improving. Does not occur all the time. He's worried about weight loss. Says that he has been 185 lbs for most all of his adult life and is now losing weight without trying. He is worried that chemotherapy will cause  thrush which he believes to be cause of his weight loss. He is weak. Able to perform his ADLs but too weak to go kayaking. He's interested in taking treatment for cancer but worried about side effects, specifically thrush. He's worried about not living to the age of 38 but also says he's not afraid to die. He says that he has stopped taking most of his medications since he was in the hospital. He say sthat his performance status and state of health was good until his gallblader issues. He wonders where the stone has gone. Denies abdominal pain. He's unsure about having a port placed.   He lives alone. He was married at age of 89 and married until his wife's passing after 70 years of marriage. 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 He has 2 sons who live close by. Knows how to tap dance. Says he enjoys driving to [facility] to ride the ferris wheel.   ECOG FS:1 - Symptomatic but completely ambulatory  Review of systems- Review of Systems  Constitutional:  Positive for malaise/fatigue and weight loss. Negative for chills and fever.  HENT:  Negative for hearing loss, nosebleeds, sore throat and tinnitus.   Eyes:  Negative for blurred vision and double vision.  Respiratory:  Negative for cough, hemoptysis, shortness of breath and wheezing.   Cardiovascular:  Negative for chest pain, palpitations and leg swelling.  Gastrointestinal:  Negative for abdominal pain, blood in stool, constipation, diarrhea, melena, nausea and vomiting.  Genitourinary:  Negative for dysuria and urgency.  Musculoskeletal:  Negative for  back pain, falls, joint pain and myalgias.  Skin:  Positive for itching. Negative for rash.  Neurological:  Positive for weakness. Negative for dizziness, tingling, sensory change, loss of consciousness and headaches.  Endo/Heme/Allergies:  Negative for environmental allergies. Does not bruise/bleed easily.  Psychiatric/Behavioral:  Negative for depression. The  patient has insomnia. The patient is not nervous/anxious.      Allergies  Allergen Reactions   Ibuprofen Swelling   Penicillins Swelling and Hives    lips Other reaction(s): Other (See Comments) Edema (lips)    Past Medical History:  Diagnosis Date   Actinic keratosis    Arrhythmia    A-Fib   BPH (benign prostatic hyperplasia)    Coronary artery disease 02/11/2007   CABG X 3  @ [facility]   CVA (cerebral infarction) 02/11/2012   Diabetes mellitus without complication (HCC)    Enlarged prostate    GERD (gastroesophageal reflux disease)    Hyperlipidemia    Hypertension    Kidney stone    Renal disorder    Skin cancer    scalp, tx by Dr Sharlett Iles 2000s?    Past Surgical History:  Procedure Laterality Date   cataract surgery Bilateral    CORONARY ARTERY BYPASS GRAFT  2009   CABG X 3 @ [facility]   LIVER BIOPSY  01/06/2022   Procedure: LIVER BIOPSY;  Surgeon: Ronny Bacon, MD;  Location: [facility] ORS;  Service: General;;   TEAR DUCT PROBING Right 05/07/2016   Procedure: Evacuation of hematoma, right peri-orbital bleed.;  Surgeon: Clista Bernhardt, MD;  Location: [place];  Service: Ophthalmology;  Laterality: Right;    Social History   Socioeconomic History   Marital status: Widowed    Spouse name: Not on file   Number of children: Not on file   Years of education: Not on file   Highest education level: Not on file  Occupational History   Not on file  Tobacco Use   Smoking status: Never   Smokeless tobacco: Never  Vaping Use   Vaping Use: Never used  Substance and Sexual Activity   Alcohol use: No    Comment: occasional   Drug use: No   Sexual activity: Not Currently  Other Topics Concern   Not on file  Social History Narrative   Not on file   Social Determinants of Health   Financial Resource Strain: Not on file  Food Insecurity: No Food Insecurity (01/02/2022)   Hunger Vital Sign    Worried About Running Out of Food in the Last Year: Never true    Ran Out of  Food in the Last Year: Never true  Transportation Needs: No Transportation Needs (01/02/2022)   PRAPARE - Hydrologist (Medical): No    Lack of Transportation (Non-Medical): No  Physical Activity: Not on file  Stress: Not on file  Social Connections: Not on file  Intimate Partner Violence: Not At Risk (01/02/2022)   Humiliation, Afraid, Rape, and Kick questionnaire    Fear of Current or Ex-Partner: No    Emotionally Abused: No    Physically Abused: No    Sexually Abused: No    Family History  Family history unknown: Yes    Current Outpatient Medications:    amLODipine (NORVASC) 10 MG tablet, TAKE 1 TABLET BY MOUTH EVERY DAY (Patient not taking: Reported on 02/18/2022), Disp: 30 tablet, Rfl: 6   aspirin EC 81 MG tablet, Take 81 mg by mouth. (Patient not taking: Reported on 02/18/2022), Disp: ,  Rfl:    atorvastatin (LIPITOR) 10 MG tablet, Take 1 tablet (10 mg total) by mouth every other day. (Patient not taking: Reported on 02/18/2022), Disp: 90 tablet, Rfl: 1   clopidogrel (PLAVIX) 75 MG tablet, TAKE 1 TABLET(75 MG) BY MOUTH DAILY (Patient not taking: Reported on 02/18/2022), Disp: 90 tablet, Rfl: 0   ezetimibe (ZETIA) 10 MG tablet, Take 10 mg by mouth daily. (Patient not taking: Reported on 02/18/2022), Disp: , Rfl:    FLUoxetine (PROZAC) 20 MG capsule, Take 20 mg by mouth daily. (Patient not taking: Reported on 02/18/2022), Disp: , Rfl:    gabapentin (NEURONTIN) 300 MG capsule, Take 300 mg by mouth 3 (three) times daily. , Disp: , Rfl:    hydrOXYzine (ATARAX) 10 MG tablet, Take 1 tablet (10 mg total) by mouth daily as needed for itching., Disp: 30 tablet, Rfl: 0   insulin detemir (LEVEMIR) 100 UNIT/ML injection, Inject 0.16 mLs (16 Units total) into the skin daily., Disp: 10 mL, Rfl: 0   lidocaine-prilocaine (EMLA) cream, Apply to affected area once (Patient not taking: Reported on 02/18/2022), Disp: 30 g, Rfl: 3   lisinopril (PRINIVIL,ZESTRIL) 20 MG tablet, Take 20 mg  by mouth daily.  (Patient not taking: Reported on 02/18/2022), Disp: , Rfl:    Magnesium 500 MG TABS, Take 500 mg by mouth daily. (Patient not taking: Reported on 02/18/2022), Disp: , Rfl:    metoprolol tartrate (LOPRESSOR) 25 MG tablet, Take 25 mg by mouth 2 (two) times daily., Disp: , Rfl:    nystatin (MYCOSTATIN) 100000 UNIT/ML suspension, Take 5 mLs by mouth 4 (four) times daily. (Patient not taking: Reported on 02/18/2022), Disp: , Rfl:    ondansetron (ZOFRAN) 8 MG tablet, Take 1 tablet (8 mg total) by mouth every 8 (eight) hours as needed for nausea or vomiting. (Patient not taking: Reported on 02/18/2022), Disp: 30 tablet, Rfl: 1   oxyCODONE (OXY IR/ROXICODONE) 5 MG immediate release tablet, Take 1 tablet (5 mg total) by mouth every 4 (four) hours as needed for moderate pain. (Patient not taking: Reported on 02/18/2022), Disp: 12 tablet, Rfl: 0   pantoprazole (PROTONIX) 40 MG tablet, Take 1 tablet (40 mg total) by mouth daily., Disp: 30 tablet, Rfl: 0   prochlorperazine (COMPAZINE) 10 MG tablet, Take 1 tablet (10 mg total) by mouth every 6 (six) hours as needed for nausea or vomiting. (Patient not taking: Reported on 02/18/2022), Disp: 30 tablet, Rfl: 1   traZODone (DESYREL) 50 MG tablet, Take 1 tablet (50 mg total) by mouth at bedtime. (Patient not taking: Reported on 02/13/2022), Disp: 5 tablet, Rfl: 0  Physical exam:  Vitals:   03/24/22 1050  BP: (!) 160/73  Pulse: 95  Resp: 20  Temp: (!) 97.5 F (36.4 C)  TempSrc: Tympanic  SpO2: 98%  Weight: 157 lb 11.2 oz (71.5 kg)  Height: 6' (1.829 m)   Physical Exam Vitals reviewed.  Constitutional:      General: He is not in acute distress.    Appearance: He is well-developed. He is not ill-appearing.     Comments: Ambulates w/o aids  HENT:     Head: Normocephalic and atraumatic.     Mouth/Throat:     Pharynx: No oropharyngeal exudate.  Eyes:     General: No scleral icterus.    Conjunctiva/sclera: Conjunctivae normal.  Cardiovascular:     Rate  and Rhythm: Normal rate and regular rhythm.  Pulmonary:     Effort: Pulmonary effort is normal.     Breath sounds:  No wheezing.  Abdominal:     General: There is no distension.     Tenderness: There is no abdominal tenderness.  Musculoskeletal:        General: No deformity.     Right lower leg: No edema.     Left lower leg: No edema.  Skin:    General: Skin is warm and dry.  Neurological:     Mental Status: He is alert and oriented to person, place, and time.     Gait: Gait normal.  Psychiatric:        Mood and Affect: Mood normal.        Behavior: Behavior normal.     Comments: Speech is difficult to follow. Does not easily answer questions and often pivots conversation.         Latest Ref Rng & Units 03/24/2022   10:19 AM  CMP  Glucose 70 - 99 mg/dL 165   BUN 8 - 23 mg/dL 30   Creatinine 0.61 - 1.24 mg/dL 1.15   Sodium 135 - 145 mmol/L 133   Potassium 3.5 - 5.1 mmol/L 4.4   Chloride 98 - 111 mmol/L 96   CO2 22 - 32 mmol/L 24   Calcium 8.9 - 10.3 mg/dL 9.4   Total Protein 6.5 - 8.1 g/dL 9.0   Total Bilirubin 0.3 - 1.2 mg/dL 1.2   Alkaline Phos 38 - 126 U/L 497   AST 15 - 41 U/L 137   ALT 0 - 44 U/L 147       Latest Ref Rng & Units 03/24/2022   10:19 AM  CBC  WBC 4.0 - 10.5 K/uL 15.3   Hemoglobin 13.0 - 17.0 g/dL 10.9   Hematocrit 39.0 - 52.0 % 34.4   Platelets 150 - 400 K/uL 170     No results found.  Assessment and plan- Patient is a 89 y.o. male who presents to [facility] for itching and weight loss.    Hepatocellular carcinoma- 10/23 CT showed left hepatic lobe lesion. MRI confirmed dominant mass measuring 6.1 x 3.8 x 3.5 cm. Left portal vein likely involved. Biopsy consistent with [place]. AFP 7.7. Lengthy discussion today regarding underlying disease and options for management. Plan was for single agent tecentriq and potentially add avastin after upper endoscopy.  Cirrhosis- apparent on MRI. 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  Itching- improved  with hydroxyzine. Ok to continue. Advised patient to follow prescription label and not self-adjust medication dosing. Will refill.  Unintentional weight loss- likely secondary to malignancy, thrush, and other recent medical issues.  CLL- elevated wbc prompted workup which was consistent with CLL. We discussed indications for treatment but for now, recommend observation as I suspect his symptoms are related to his [facility] as opposed to CLL. WBC 15.3 today.  Anemia- hmg 10.9. Stable. Monitor.  Venous access- hold port for now. Can reconsider in the future if needed.  Abnormal LFTs- s/p cholecystectomy 01/08/22. Thought to be secondary to gallstones in bile duct and underwent MRI w/ MRCP w/o evidence of stones or cholangitis. IR was consulted for possible PTCA but held d/t improvement & stabilization of LFTs. Today, LFTs are elevated but stable compared to 1 months ago. His bilirubin remains ULN. Clinically, well appearing. Monitor.  Enlarged left adrenal nodule- previously 1.1 cm, measured 1.5 cm on 02/11/22 MRI. Monitor.  EGD- to rule out varices for use of varices. EGD was cancelled d/t not receiving clearance to hold plavix from cardiology. Can reconsider.  Goals of care: we  again discussed options for management of his underlying [place] including supportive care vs systemic treatment. Treatment would be given with palliative/non-curative treatment. Patient wishes to undergo treatment. Patient is followed by palliative care, Billey Chang, NP.   Disposition:  CT A/P asap Follow up with Dr. Janese Banks for results and treatment planning with labs (cbc, cmp, afp)- la  Visit Diagnosis 1. Hepatocellular carcinoma (HCC)   2. Elevated LFTs   3. Goals of care, counseling/discussion   4. Cirrhosis of liver without ascites, unspecified hepatic cirrhosis type ([place])   5. Pruritus   6. CLL (chronic lymphocytic leukemia) ([place])    Patient expressed understanding and was in agreement with this plan. He also understands that  He can call clinic at any time with any questions, concerns, or complaints.   I discussed the assessment and treatment plan with the patient. The patient was provided an opportunity to ask questions and all were answered. The patient agreed with the plan and demonstrated an understanding of the instructions.   The patient was advised to call back or seek an in-person evaluation if the symptoms worsen or if the condition fails to improve as anticipated.   I spent 50 minutes face-to-face visit time dedicated to the care of this patient on the date of this encounter to including pre-visit review of med-onc notes, palliative care notes, imaging, face-to-face time with the patient, and post visit ordering of testing/documentation.    Thank you for allowing me to participate in the care of this very pleasant patient.   Beckey Rutter, DNP, AGNP-C, [place] at [place]  CC: Dr. Janese Banks

## 2022-03-24 NOTE — Telephone Encounter (Signed)
spoke with pt to notify him of MD appts scheduled for 2/19. Pt confirmed appt.

## 2022-03-24 NOTE — Telephone Encounter (Signed)
Pt had told Janese Banks that he is too weak to get chemo and he is weak. Dr Janese Banks wanted pt to  have  seen on [facility]. With labs 10:30  and then see Lauren.. he will be here

## 2022-03-25 ENCOUNTER — Other Ambulatory Visit: Payer: Self-pay

## 2022-03-27 ENCOUNTER — Other Ambulatory Visit: Payer: Self-pay

## 2022-03-28 ENCOUNTER — Ambulatory Visit: Payer: BLUE CROSS/BLUE SHIELD

## 2022-03-31 ENCOUNTER — Ambulatory Visit: Payer: BLUE CROSS/BLUE SHIELD | Admitting: Oncology

## 2022-04-07 ENCOUNTER — Inpatient Hospital Stay: Payer: Medicare Other | Admitting: Oncology

## 2022-04-07 ENCOUNTER — Other Ambulatory Visit: Payer: Self-pay | Admitting: *Deleted

## 2022-04-07 DIAGNOSIS — C220 Liver cell carcinoma: Secondary | ICD-10-CM

## 2022-04-08 ENCOUNTER — Encounter: Payer: Self-pay | Admitting: Oncology

## 2022-04-08 ENCOUNTER — Inpatient Hospital Stay (HOSPITAL_BASED_OUTPATIENT_CLINIC_OR_DEPARTMENT_OTHER): Payer: Medicare Other | Admitting: Oncology

## 2022-04-08 ENCOUNTER — Other Ambulatory Visit: Payer: Self-pay | Admitting: *Deleted

## 2022-04-08 ENCOUNTER — Inpatient Hospital Stay: Payer: Medicare Other

## 2022-04-08 VITALS — BP 151/78 | HR 68 | Temp 97.0°F | Resp 16 | Ht 72.0 in | Wt 165.0 lb

## 2022-04-08 DIAGNOSIS — R7989 Other specified abnormal findings of blood chemistry: Secondary | ICD-10-CM

## 2022-04-08 DIAGNOSIS — C220 Liver cell carcinoma: Secondary | ICD-10-CM

## 2022-04-08 LAB — CBC WITH DIFFERENTIAL/PLATELET
Abs Immature Granulocytes: 0.09 10*3/uL — ABNORMAL HIGH (ref 0.00–0.07)
Basophils Absolute: 0.1 10*3/uL (ref 0.0–0.1)
Basophils Relative: 1 %
Eosinophils Absolute: 0.5 10*3/uL (ref 0.0–0.5)
Eosinophils Relative: 3 %
HCT: 33.9 % — ABNORMAL LOW (ref 39.0–52.0)
Hemoglobin: 10.5 g/dL — ABNORMAL LOW (ref 13.0–17.0)
Immature Granulocytes: 1 %
Lymphocytes Relative: 44 %
Lymphs Abs: 7.3 10*3/uL — ABNORMAL HIGH (ref 0.7–4.0)
MCH: 26.1 pg (ref 26.0–34.0)
MCHC: 31 g/dL (ref 30.0–36.0)
MCV: 84.1 fL (ref 80.0–100.0)
Monocytes Absolute: 3.3 10*3/uL — ABNORMAL HIGH (ref 0.1–1.0)
Monocytes Relative: 20 %
Neutro Abs: 5 10*3/uL (ref 1.7–7.7)
Neutrophils Relative %: 31 %
Platelets: 179 10*3/uL (ref 150–400)
RBC: 4.03 MIL/uL — ABNORMAL LOW (ref 4.22–5.81)
RDW: 21.9 % — ABNORMAL HIGH (ref 11.5–15.5)
Smear Review: NORMAL
WBC: 16.2 10*3/uL — ABNORMAL HIGH (ref 4.0–10.5)
nRBC: 0 % (ref 0.0–0.2)

## 2022-04-08 LAB — COMPREHENSIVE METABOLIC PANEL
ALT: 33 U/L (ref 0–44)
AST: 52 U/L — ABNORMAL HIGH (ref 15–41)
Albumin: 3 g/dL — ABNORMAL LOW (ref 3.5–5.0)
Alkaline Phosphatase: 329 U/L — ABNORMAL HIGH (ref 38–126)
Anion gap: 9 (ref 5–15)
BUN: 19 mg/dL (ref 8–23)
CO2: 28 mmol/L (ref 22–32)
Calcium: 9.1 mg/dL (ref 8.9–10.3)
Chloride: 99 mmol/L (ref 98–111)
Creatinine, Ser: 1.27 mg/dL — ABNORMAL HIGH (ref 0.61–1.24)
GFR, Estimated: 53 mL/min — ABNORMAL LOW (ref >60)
Glucose, Bld: 198 mg/dL — ABNORMAL HIGH (ref 70–99)
Potassium: 4.5 mmol/L (ref 3.5–5.1)
Sodium: 136 mmol/L (ref 135–145)
Total Bilirubin: 0.6 mg/dL (ref 0.3–1.2)
Total Protein: 8.6 g/dL — ABNORMAL HIGH (ref 6.5–8.1)

## 2022-04-08 LAB — GAMMA GT: GGT: 153 U/L — ABNORMAL HIGH (ref 7–50)

## 2022-04-08 LAB — PROTIME-INR
INR: 1.2 (ref 0.8–1.2)
Prothrombin Time: 15.2 seconds (ref 11.4–15.2)

## 2022-04-08 MED ORDER — TRAZODONE HCL 50 MG PO TABS: 50.0000 mg | ORAL_TABLET | Freq: Every day | ORAL | 0 refills | Status: DC

## 2022-04-08 NOTE — Progress Notes (Unsigned)
Referral sent to Dr. Estelle Grumbles at [place] [facility].

## 2022-04-09 ENCOUNTER — Other Ambulatory Visit: Payer: Self-pay

## 2022-04-10 LAB — AFP TUMOR MARKER: AFP, Serum, Tumor Marker: 55.6 ng/mL — ABNORMAL HIGH (ref 0.0–6.4)

## 2022-04-10 NOTE — Progress Notes (Signed)
Hematology/Oncology Consult note [facility]  [phone] [phone]  Patient Care Team: Pcp, No as PCP - General Rockey Situ Kathlene November, MD as PCP - Cardiology (Cardiology) Minna Merritts, MD as Consulting Physician (Cardiology) Clent Jacks, RN as Oncology Nurse Navigator Sindy Guadeloupe, MD as Consulting Physician (Oncology) Borders, Kirt Boys, NP as Nurse Practitioner [facility] and Palliative Medicine)   Name of the patient: Randy Frazier  [id number]  06-03-1927   Date of visit: 04/10/22  Diagnosis- 1.  Locally advanced hepatocellular carcinoma 2.  CLL  Chief complaint/ Reason for visit-discuss further management of hepatocellular carcinoma  Heme/Onc history: Patient is a 89 year old male who was seen by GI after he had a CT abdomen in October 2023 for abdominal pain.  CT showed enhancing lesion in the left hepatic lobe measuring 3.6 x 2.5 cm.  This was followed by an MRI abdomen with and without contrast which shows a dominant mass measuring 6.1 x 3.8 x 3.5 cm in the segment 2 of the liver.  Multiple other foci of hyperenhancement up to 1.3 cm which were categorized as LI-RADS 4.  Other lesions which were small but are also suspicious for multifocal hepatocellular carcinoma LI-RADS 3.  The left portal vein was likely involved with the dominant high left hepatic lobe tumor.   He was found to have changes of cirrhosis as well on the scan although the cause of cirrhosis is presently unclear.     He has undergone some work-up by GI including hepatitis testing which was negative.  Iron studies are normal.  He is a lifelong teetotaler.  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 Tumor cells positive for pancytokeratin, HepPar1 and negative for CK7 CK20 TTF-1 CDX2 GATA3 PSA and CD56.  AFP mildly elevated at 7.7   Plan was to get started with single agent Tecentriq and subsequently add a  Avastin after he gets an upper endoscopy.  Patient has not had an upper endoscopy yet and had not yet decided about starting treatment  Interval history-patient last saw me about 6 weeks ago and then subsequently did not follow-up.  States that he was feeling fatigued and did not feel like considering treatment.  States that he is willing to consider treatment for his liver cancer.  ECOG PS- 1 Pain scale- 0 Opioid associated constipation- no  Review of systems- Review of Systems  Constitutional:  Positive for malaise/fatigue. Negative for chills, fever and weight loss.  HENT:  Negative for congestion, ear discharge and nosebleeds.   Eyes:  Negative for blurred vision.  Respiratory:  Negative for cough, hemoptysis, sputum production, shortness of breath and wheezing.   Cardiovascular:  Negative for chest pain, palpitations, orthopnea and claudication.  Gastrointestinal:  Negative for abdominal pain, blood in stool, constipation, diarrhea, heartburn, melena, nausea and vomiting.  Genitourinary:  Negative for dysuria, flank pain, frequency, hematuria and urgency.  Musculoskeletal:  Negative for back pain, joint pain and myalgias.  Skin:  Negative for rash.  Neurological:  Negative for dizziness, tingling, focal weakness, seizures, weakness and headaches.  Endo/Heme/Allergies:  Does not bruise/bleed easily.  Psychiatric/Behavioral:  Negative for depression and suicidal ideas. The patient does not have insomnia.       Allergies  Allergen Reactions   Ibuprofen Swelling   Penicillins Swelling and Hives    lips Other reaction(s): Other (See Comments) Edema (lips)     Past Medical History:  Diagnosis Date   Actinic  keratosis    Arrhythmia    A-Fib   BPH (benign prostatic hyperplasia)    Coronary artery disease 02/11/2007   CABG X 3  @ [facility]   CVA (cerebral infarction) 02/11/2012   Diabetes mellitus without complication (HCC)    Enlarged prostate    GERD (gastroesophageal reflux  disease)    Hyperlipidemia    Hypertension    Kidney stone    Renal disorder    Skin cancer    scalp, tx by Dr Sharlett Iles 2000s?     Past Surgical History:  Procedure Laterality Date   cataract surgery Bilateral    CORONARY ARTERY BYPASS GRAFT  2009   CABG X 3 @ [facility]   LIVER BIOPSY  01/06/2022   Procedure: LIVER BIOPSY;  Surgeon: Ronny Bacon, MD;  Location: [facility] ORS;  Service: General;;   TEAR DUCT PROBING Right 05/07/2016   Procedure: Evacuation of hematoma, right peri-orbital bleed.;  Surgeon: Clista Bernhardt, MD;  Location: [place];  Service: Ophthalmology;  Laterality: Right;    Social History   Socioeconomic History   Marital status: Widowed    Spouse name: Not on file   Number of children: Not on file   Years of education: Not on file   Highest education level: Not on file  Occupational History   Not on file  Tobacco Use   Smoking status: Never   Smokeless tobacco: Never  Vaping Use   Vaping Use: Never used  Substance and Sexual Activity   Alcohol use: No    Comment: occasional   Drug use: No   Sexual activity: Not Currently  Other Topics Concern   Not on file  Social History Narrative   Not on file   Social Determinants of Health   Financial Resource Strain: Not on file  Food Insecurity: No Food Insecurity (01/02/2022)   Hunger Vital Sign    Worried About Running Out of Food in the Last Year: Never true    Ran Out of Food in the Last Year: Never true  Transportation Needs: No Transportation Needs (01/02/2022)   PRAPARE - Hydrologist (Medical): No    Lack of Transportation (Non-Medical): No  Physical Activity: Not on file  Stress: Not on file  Social Connections: Not on file  Intimate Partner Violence: Not At Risk (01/02/2022)   Humiliation, Afraid, Rape, and Kick questionnaire    Fear of Current or Ex-Partner: No    Emotionally Abused: No    Physically Abused: No    Sexually Abused: No    Family History   Family history unknown: Yes     Current Outpatient Medications:    amLODipine (NORVASC) 10 MG tablet, TAKE 1 TABLET BY MOUTH EVERY DAY, Disp: 30 tablet, Rfl: 6   aspirin EC 81 MG tablet, Take 81 mg by mouth., Disp: , Rfl:    atorvastatin (LIPITOR) 10 MG tablet, Take 1 tablet (10 mg total) by mouth every other day., Disp: 90 tablet, Rfl: 1   clopidogrel (PLAVIX) 75 MG tablet, TAKE 1 TABLET(75 MG) BY MOUTH DAILY, Disp: 90 tablet, Rfl: 0   ezetimibe (ZETIA) 10 MG tablet, Take 10 mg by mouth daily., Disp: , Rfl:    FLUoxetine (PROZAC) 20 MG capsule, Take 20 mg by mouth daily., Disp: , Rfl:    gabapentin (NEURONTIN) 300 MG capsule, Take 300 mg by mouth 3 (three) times daily. , Disp: , Rfl:    hydrOXYzine (ATARAX) 10 MG tablet, Take 1 tablet (10 mg  total) by mouth daily as needed for itching., Disp: 30 tablet, Rfl: 0   insulin detemir (LEVEMIR) 100 UNIT/ML injection, Inject 0.16 mLs (16 Units total) into the skin daily., Disp: 10 mL, Rfl: 0   insulin glargine (LANTUS SOLOSTAR) 100 UNIT/ML Solostar Pen, Inject into the skin., Disp: , Rfl:    insulin lispro (HUMALOG) 100 UNIT/ML injection, Inject into the skin., Disp: , Rfl:    lidocaine-prilocaine (EMLA) cream, Apply to affected area once, Disp: 30 g, Rfl: 3   lisinopril (PRINIVIL,ZESTRIL) 20 MG tablet, Take 20 mg by mouth daily., Disp: , Rfl:    Magnesium 500 MG TABS, Take 500 mg by mouth daily., Disp: , Rfl:    metFORMIN (GLUCOPHAGE-XR) 500 MG 24 hr tablet, Take by mouth., Disp: , Rfl:    metoprolol tartrate (LOPRESSOR) 25 MG tablet, Take 25 mg by mouth 2 (two) times daily., Disp: , Rfl:    nystatin (MYCOSTATIN) 100000 UNIT/ML suspension, Take 5 mLs by mouth 4 (four) times daily., Disp: , Rfl:    ondansetron (ZOFRAN) 8 MG tablet, Take 1 tablet (8 mg total) by mouth every 8 (eight) hours as needed for nausea or vomiting., Disp: 30 tablet, Rfl: 1   oxyCODONE (OXY IR/ROXICODONE) 5 MG immediate release tablet, Take 1 tablet (5 mg total) by mouth  every 4 (four) hours as needed for moderate pain., Disp: 12 tablet, Rfl: 0   prochlorperazine (COMPAZINE) 10 MG tablet, Take 1 tablet (10 mg total) by mouth every 6 (six) hours as needed for nausea or vomiting., Disp: 30 tablet, Rfl: 1   pantoprazole (PROTONIX) 40 MG tablet, Take 1 tablet (40 mg total) by mouth daily., Disp: 30 tablet, Rfl: 0   traZODone (DESYREL) 50 MG tablet, Take 1 tablet (50 mg total) by mouth at bedtime., Disp: 30 tablet, Rfl: 0  Physical exam:  Vitals:   04/08/22 1309  BP: (!) 151/78  Pulse: 68  Resp: 16  Temp: (!) 97 F (36.1 C)  TempSrc: Tympanic  SpO2: 100%  Weight: 165 lb (74.8 kg)  Height: 6' (1.829 m)   Physical Exam Cardiovascular:     Rate and Rhythm: Normal rate and regular rhythm.     Heart sounds: Normal heart sounds.  Pulmonary:     Effort: Pulmonary effort is normal.     Breath sounds: Normal breath sounds.  Abdominal:     General: Bowel sounds are normal.     Palpations: Abdomen is soft.  Skin:    General: Skin is warm and dry.  Neurological:     Mental Status: He is alert and oriented to person, place, and time.         Latest Ref Rng & Units 04/08/2022    1:36 PM  CMP  Glucose 70 - 99 mg/dL 198   BUN 8 - 23 mg/dL 19   Creatinine 0.61 - 1.24 mg/dL 1.27   Sodium 135 - 145 mmol/L 136   Potassium 3.5 - 5.1 mmol/L 4.5   Chloride 98 - 111 mmol/L 99   CO2 22 - 32 mmol/L 28   Calcium 8.9 - 10.3 mg/dL 9.1   Total Protein 6.5 - 8.1 g/dL 8.6   Total Bilirubin 0.3 - 1.2 mg/dL 0.6   Alkaline Phos 38 - 126 U/L 329   AST 15 - 41 U/L 52   ALT 0 - 44 U/L 33       Latest Ref Rng & Units 04/08/2022    1:36 PM  CBC  WBC 4.0 - 10.5 K/uL  16.2   Hemoglobin 13.0 - 17.0 g/dL 10.5   Hematocrit 39.0 - 52.0 % 33.9   Platelets 150 - 400 K/uL 179     No images are attached to the encounter.  No results found.   Assessment and plan- Patient is a 89 y.o. male with locally advanced multifocal hepatocellular carcinoma here to discuss further  management  Patient was diagnosed with Stage IIIa multifocal hepatocellular carcinoma in January 2024 and plan was to start Tecentriq plus Avastin chemotherapy.  However patient did not follow through with his plan and has not seen me for the last 6 weeks.  His LFTs were waxing and waning in the meanwhile and the etiology was unclear.  Today his LFTs are better than before and bilirubin is normal.  It would be reasonable to proceed with single agent Tecentriq at this time.  He was supposed to get upper endoscopy to rule out varices before starting a Avastin.  He did not obtain Plavix clearance and therefore never went for his upper endoscopy.  Today patient is still not sure what he wants to do.  He feels like he is not quite back to his baseline since his gallbladder surgery.  Reports ongoing fatigue.  I explained to the patient that fatigue could also be due to his underlying malignancy and he needs to decide sooner than later if he wants to pursue treatment or not.  Although he is 74 he still has a good performance status and Tecentriq Avastin treatment is typically well-tolerated.  Patient wants to go to [facility] and he is considering getting his treatment there.  He will require updated imaging again before starting treatment to a certain his extent of disease.  I am referring him to Dr. Estelle Grumbles at this time for further management of his hepatocellular carcinoma per his preference.  Based on his labs he is a child Pugh A.  Patient was noted to have chronic leukocytosis/lymphocytosis and I had done a flow cytometry which confirmed that he also incidentally has CLL.  This can be monitored conservatively  I am not giving him any follow-up with me at this time and he will get his care at [facility].      Visit Diagnosis 1. Hepatocellular carcinoma (HCC)   2. Elevated LFTs      Dr. Randa Evens, MD, MPH [facility] [id number] 04/10/2022 8:34 PM

## 2022-04-18 ENCOUNTER — Other Ambulatory Visit: Payer: Self-pay

## 2022-04-18 ENCOUNTER — Emergency Department: Payer: Medicare Other

## 2022-04-18 ENCOUNTER — Observation Stay (HOSPITAL_BASED_OUTPATIENT_CLINIC_OR_DEPARTMENT_OTHER)
Admission: EM | Admit: 2022-04-18 | Discharge: 2022-04-19 | Disposition: A | Discharge status: Home / Self Care | Payer: Medicare Other | Source: Home / Self Care | Attending: Emergency Medicine | Admitting: Emergency Medicine

## 2022-04-18 ENCOUNTER — Encounter: Payer: Self-pay | Admitting: Oncology

## 2022-04-18 ENCOUNTER — Inpatient Hospital Stay: Payer: Medicare Other

## 2022-04-18 DIAGNOSIS — Z66 Do not resuscitate: Secondary | ICD-10-CM | POA: Insufficient documentation

## 2022-04-18 DIAGNOSIS — C220 Liver cell carcinoma: Secondary | ICD-10-CM | POA: Diagnosis present

## 2022-04-18 DIAGNOSIS — N1831 Chronic kidney disease, stage 3a: Secondary | ICD-10-CM | POA: Insufficient documentation

## 2022-04-18 DIAGNOSIS — Z7982 Long term (current) use of aspirin: Secondary | ICD-10-CM | POA: Insufficient documentation

## 2022-04-18 DIAGNOSIS — F418 Other specified anxiety disorders: Secondary | ICD-10-CM | POA: Diagnosis present

## 2022-04-18 DIAGNOSIS — Z8673 Personal history of transient ischemic attack (TIA), and cerebral infarction without residual deficits: Secondary | ICD-10-CM | POA: Insufficient documentation

## 2022-04-18 DIAGNOSIS — I5A Non-ischemic myocardial injury (non-traumatic): Secondary | ICD-10-CM | POA: Insufficient documentation

## 2022-04-18 DIAGNOSIS — I2510 Atherosclerotic heart disease of native coronary artery without angina pectoris: Secondary | ICD-10-CM | POA: Insufficient documentation

## 2022-04-18 DIAGNOSIS — E1122 Type 2 diabetes mellitus with diabetic chronic kidney disease: Secondary | ICD-10-CM | POA: Insufficient documentation

## 2022-04-18 DIAGNOSIS — E440 Moderate protein-calorie malnutrition: Secondary | ICD-10-CM | POA: Diagnosis present

## 2022-04-18 DIAGNOSIS — Z85828 Personal history of other malignant neoplasm of skin: Secondary | ICD-10-CM | POA: Insufficient documentation

## 2022-04-18 DIAGNOSIS — I10 Essential (primary) hypertension: Secondary | ICD-10-CM | POA: Diagnosis present

## 2022-04-18 DIAGNOSIS — I2699 Other pulmonary embolism without acute cor pulmonale: Secondary | ICD-10-CM | POA: Diagnosis present

## 2022-04-18 DIAGNOSIS — R7989 Other specified abnormal findings of blood chemistry: Secondary | ICD-10-CM | POA: Diagnosis present

## 2022-04-18 DIAGNOSIS — I25810 Atherosclerosis of coronary artery bypass graft(s) without angina pectoris: Secondary | ICD-10-CM | POA: Diagnosis present

## 2022-04-18 DIAGNOSIS — T68XXXA Hypothermia, initial encounter: Secondary | ICD-10-CM | POA: Diagnosis present

## 2022-04-18 DIAGNOSIS — Z7189 Other specified counseling: Secondary | ICD-10-CM

## 2022-04-18 DIAGNOSIS — I2693 Single subsegmental pulmonary embolism without acute cor pulmonale: Secondary | ICD-10-CM

## 2022-04-18 DIAGNOSIS — D696 Thrombocytopenia, unspecified: Secondary | ICD-10-CM | POA: Diagnosis present

## 2022-04-18 DIAGNOSIS — R945 Abnormal results of liver function studies: Secondary | ICD-10-CM | POA: Insufficient documentation

## 2022-04-18 DIAGNOSIS — I129 Hypertensive chronic kidney disease with stage 1 through stage 4 chronic kidney disease, or unspecified chronic kidney disease: Secondary | ICD-10-CM | POA: Insufficient documentation

## 2022-04-18 DIAGNOSIS — Z1152 Encounter for screening for COVID-19: Secondary | ICD-10-CM | POA: Insufficient documentation

## 2022-04-18 DIAGNOSIS — Z7984 Long term (current) use of oral hypoglycemic drugs: Secondary | ICD-10-CM | POA: Insufficient documentation

## 2022-04-18 DIAGNOSIS — E1129 Type 2 diabetes mellitus with other diabetic kidney complication: Secondary | ICD-10-CM | POA: Diagnosis present

## 2022-04-18 DIAGNOSIS — C9110 Chronic lymphocytic leukemia of B-cell type not having achieved remission: Secondary | ICD-10-CM | POA: Diagnosis present

## 2022-04-18 DIAGNOSIS — I2609 Other pulmonary embolism with acute cor pulmonale: Secondary | ICD-10-CM | POA: Insufficient documentation

## 2022-04-18 DIAGNOSIS — I4581 Long QT syndrome: Secondary | ICD-10-CM | POA: Insufficient documentation

## 2022-04-18 DIAGNOSIS — Z951 Presence of aortocoronary bypass graft: Secondary | ICD-10-CM | POA: Insufficient documentation

## 2022-04-18 DIAGNOSIS — Z7902 Long term (current) use of antithrombotics/antiplatelets: Secondary | ICD-10-CM | POA: Insufficient documentation

## 2022-04-18 DIAGNOSIS — R9431 Abnormal electrocardiogram [ECG] [EKG]: Secondary | ICD-10-CM | POA: Diagnosis present

## 2022-04-18 DIAGNOSIS — Z7901 Long term (current) use of anticoagulants: Secondary | ICD-10-CM | POA: Insufficient documentation

## 2022-04-18 DIAGNOSIS — R109 Unspecified abdominal pain: Secondary | ICD-10-CM | POA: Insufficient documentation

## 2022-04-18 DIAGNOSIS — R0902 Hypoxemia: Principal | ICD-10-CM

## 2022-04-18 DIAGNOSIS — E785 Hyperlipidemia, unspecified: Secondary | ICD-10-CM | POA: Diagnosis present

## 2022-04-18 LAB — GLUCOSE, CAPILLARY
Glucose-Capillary: 207 mg/dL — ABNORMAL HIGH (ref 70–99)
Glucose-Capillary: 183 mg/dL — ABNORMAL HIGH (ref 70–99)

## 2022-04-18 LAB — CBC
HCT: 36.3 % — ABNORMAL LOW (ref 39.0–52.0)
Hemoglobin: 11.3 g/dL — ABNORMAL LOW (ref 13.0–17.0)
MCH: 25.8 pg — ABNORMAL LOW (ref 26.0–34.0)
MCHC: 31.1 g/dL (ref 30.0–36.0)
MCV: 82.9 fL (ref 80.0–100.0)
Platelets: 139 10*3/uL — ABNORMAL LOW (ref 150–400)
RBC: 4.38 MIL/uL (ref 4.22–5.81)
RDW: 22.5 % — ABNORMAL HIGH (ref 11.5–15.5)
WBC: 17.7 10*3/uL — ABNORMAL HIGH (ref 4.0–10.5)
nRBC: 0.2 % (ref 0.0–0.2)

## 2022-04-18 LAB — CBG MONITORING, ED
Glucose-Capillary: 191 mg/dL — ABNORMAL HIGH (ref 70–99)
Glucose-Capillary: 217 mg/dL — ABNORMAL HIGH (ref 70–99)

## 2022-04-18 LAB — COMPREHENSIVE METABOLIC PANEL
ALT: 65 U/L — ABNORMAL HIGH (ref 0–44)
AST: 91 U/L — ABNORMAL HIGH (ref 15–41)
Albumin: 3.1 g/dL — ABNORMAL LOW (ref 3.5–5.0)
Alkaline Phosphatase: 329 U/L — ABNORMAL HIGH (ref 38–126)
Anion gap: 10 (ref 5–15)
BUN: 23 mg/dL (ref 8–23)
CO2: 23 mmol/L (ref 22–32)
Calcium: 8.8 mg/dL — ABNORMAL LOW (ref 8.9–10.3)
Chloride: 98 mmol/L (ref 98–111)
Creatinine, Ser: 1.21 mg/dL (ref 0.61–1.24)
GFR, Estimated: 56 mL/min — ABNORMAL LOW (ref >60)
Glucose, Bld: 237 mg/dL — ABNORMAL HIGH (ref 70–99)
Potassium: 4.4 mmol/L (ref 3.5–5.1)
Sodium: 131 mmol/L — ABNORMAL LOW (ref 135–145)
Total Bilirubin: 1.1 mg/dL (ref 0.3–1.2)
Total Protein: 8 g/dL (ref 6.5–8.1)

## 2022-04-18 LAB — PROTIME-INR
INR: 1.2 (ref 0.8–1.2)
Prothrombin Time: 15 seconds (ref 11.4–15.2)

## 2022-04-18 LAB — LIPASE, BLOOD: Lipase: 32 U/L (ref 11–51)

## 2022-04-18 LAB — HEPARIN LEVEL (UNFRACTIONATED)
Heparin Unfractionated: 0.1 IU/mL — ABNORMAL LOW (ref 0.30–0.70)
Heparin Unfractionated: 0.18 IU/mL — ABNORMAL LOW (ref 0.30–0.70)

## 2022-04-18 LAB — APTT: aPTT: 40 seconds — ABNORMAL HIGH (ref 24–36)

## 2022-04-18 LAB — TROPONIN I (HIGH SENSITIVITY): Troponin I (High Sensitivity): 27 ng/L — ABNORMAL HIGH (ref <18)

## 2022-04-18 LAB — RESP PANEL BY RT-PCR (RSV, FLU A&B, COVID)  RVPGX2
Influenza A by PCR: NEGATIVE
Influenza B by PCR: NEGATIVE
Resp Syncytial Virus by PCR: NEGATIVE
SARS Coronavirus 2 by RT PCR: NEGATIVE

## 2022-04-18 MED ORDER — ONDANSETRON HCL 4 MG PO TABS: 4.0000 mg | ORAL_TABLET | Freq: Four times a day (QID) | ORAL | Status: DC | PRN

## 2022-04-18 MED ORDER — ONDANSETRON HCL 4 MG PO TABS: 8.0000 mg | ORAL_TABLET | Freq: Three times a day (TID) | ORAL | Status: DC | PRN

## 2022-04-18 MED ORDER — ONDANSETRON HCL 4 MG/2ML IJ SOLN
4.0000 mg | Freq: Once | INTRAMUSCULAR | Status: AC | PRN
  Administered 2022-04-18: 4 mg via INTRAVENOUS
  Filled 2022-04-18: qty 2

## 2022-04-18 MED ORDER — HEPARIN SODIUM (PORCINE) 5000 UNIT/ML IJ SOLN
60.0000 [IU]/kg | Freq: Once | INTRAMUSCULAR | Status: DC
  Filled 2022-04-18: qty 1

## 2022-04-18 MED ORDER — OXYCODONE HCL 5 MG PO TABS
5.0000 mg | ORAL_TABLET | ORAL | Status: DC | PRN
  Administered 2022-04-18: 5 mg via ORAL
  Filled 2022-04-18: qty 1

## 2022-04-18 MED ORDER — METOPROLOL TARTRATE 25 MG PO TABS
25.0000 mg | ORAL_TABLET | Freq: Two times a day (BID) | ORAL | Status: DC
  Administered 2022-04-18 – 2022-04-19 (×3): 25 mg via ORAL
  Filled 2022-04-18 (×3): qty 1

## 2022-04-18 MED ORDER — MORPHINE SULFATE (PF) 4 MG/ML IV SOLN
4.0000 mg | Freq: Once | INTRAVENOUS | Status: AC
  Administered 2022-04-18: 4 mg via INTRAVENOUS
  Filled 2022-04-18: qty 1

## 2022-04-18 MED ORDER — LACTATED RINGERS IV BOLUS
1000.0000 mL | Freq: Once | INTRAVENOUS | Status: AC
  Administered 2022-04-18: 1000 mL via INTRAVENOUS

## 2022-04-18 MED ORDER — GABAPENTIN 300 MG PO CAPS
300.0000 mg | ORAL_CAPSULE | Freq: Three times a day (TID) | ORAL | Status: DC
  Administered 2022-04-18 – 2022-04-19 (×4): 300 mg via ORAL
  Filled 2022-04-18 (×4): qty 1

## 2022-04-18 MED ORDER — TRAZODONE HCL 50 MG PO TABS: 50.0000 mg | ORAL_TABLET | Freq: Every day | ORAL | Status: DC

## 2022-04-18 MED ORDER — TRAZODONE HCL 50 MG PO TABS: 25.0000 mg | ORAL_TABLET | Freq: Every evening | ORAL | Status: DC | PRN

## 2022-04-18 MED ORDER — ALBUTEROL SULFATE (2.5 MG/3ML) 0.083% IN NEBU: 3.0000 mL | INHALATION_SOLUTION | RESPIRATORY_TRACT | Status: DC | PRN

## 2022-04-18 MED ORDER — INSULIN DETEMIR 100 UNIT/ML ~~LOC~~ SOLN
16.0000 [IU] | Freq: Every day | SUBCUTANEOUS | Status: DC
  Filled 2022-04-18: qty 0.16

## 2022-04-18 MED ORDER — EZETIMIBE 10 MG PO TABS
10.0000 mg | ORAL_TABLET | Freq: Every day | ORAL | Status: DC
  Administered 2022-04-18 – 2022-04-19 (×2): 10 mg via ORAL
  Filled 2022-04-18 (×2): qty 1

## 2022-04-18 MED ORDER — HEPARIN (PORCINE) 25000 UT/250ML-% IV SOLN
1400.0000 [IU]/h | INTRAVENOUS | Status: DC
  Administered 2022-04-18: 1000 [IU]/h via INTRAVENOUS
  Filled 2022-04-18 (×2): qty 250

## 2022-04-18 MED ORDER — MAGNESIUM OXIDE -MG SUPPLEMENT 400 (240 MG) MG PO TABS
400.0000 mg | ORAL_TABLET | Freq: Every day | ORAL | Status: DC
  Administered 2022-04-18 – 2022-04-19 (×2): 400 mg via ORAL
  Filled 2022-04-18 (×2): qty 1

## 2022-04-18 MED ORDER — ATORVASTATIN CALCIUM 10 MG PO TABS
10.0000 mg | ORAL_TABLET | ORAL | Status: DC
  Administered 2022-04-18: 10 mg via ORAL
  Filled 2022-04-18: qty 1

## 2022-04-18 MED ORDER — ADULT MULTIVITAMIN W/MINERALS CH
1.0000 | ORAL_TABLET | Freq: Every day | ORAL | Status: DC
  Administered 2022-04-18 – 2022-04-19 (×2): 1 via ORAL
  Filled 2022-04-18: qty 1

## 2022-04-18 MED ORDER — IOHEXOL 350 MG/ML SOLN
75.0000 mL | Freq: Once | INTRAVENOUS | Status: AC | PRN
  Administered 2022-04-18: 75 mL via INTRAVENOUS

## 2022-04-18 MED ORDER — HYDROXYZINE HCL 10 MG PO TABS: 10.0000 mg | ORAL_TABLET | Freq: Every day | ORAL | Status: DC | PRN

## 2022-04-18 MED ORDER — CLOPIDOGREL BISULFATE 75 MG PO TABS: 75.0000 mg | ORAL_TABLET | Freq: Every day | ORAL | Status: DC

## 2022-04-18 MED ORDER — PROCHLORPERAZINE MALEATE 10 MG PO TABS: 10.0000 mg | ORAL_TABLET | Freq: Four times a day (QID) | ORAL | Status: DC | PRN

## 2022-04-18 MED ORDER — ONDANSETRON HCL 4 MG/2ML IJ SOLN: 4.0000 mg | Freq: Four times a day (QID) | INTRAMUSCULAR | Status: DC | PRN

## 2022-04-18 MED ORDER — INSULIN DETEMIR 100 UNIT/ML ~~LOC~~ SOLN
16.0000 [IU] | Freq: Every day | SUBCUTANEOUS | Status: DC
  Administered 2022-04-18: 16 [IU] via SUBCUTANEOUS
  Filled 2022-04-18 (×2): qty 0.16

## 2022-04-18 MED ORDER — ACETAMINOPHEN 325 MG RE SUPP: 650.0000 mg | Freq: Four times a day (QID) | RECTAL | Status: DC | PRN

## 2022-04-18 MED ORDER — INSULIN ASPART 100 UNIT/ML IJ SOLN: 0.0000 [IU] | Freq: Every day | INTRAMUSCULAR | Status: DC

## 2022-04-18 MED ORDER — MAGNESIUM HYDROXIDE 400 MG/5ML PO SUSP: 30.0000 mL | Freq: Every day | ORAL | Status: DC | PRN

## 2022-04-18 MED ORDER — ASPIRIN 81 MG PO TBEC
81.0000 mg | DELAYED_RELEASE_TABLET | Freq: Every day | ORAL | Status: DC
  Administered 2022-04-18 – 2022-04-19 (×2): 81 mg via ORAL
  Filled 2022-04-18 (×2): qty 1

## 2022-04-18 MED ORDER — HEPARIN BOLUS VIA INFUSION
1800.0000 [IU] | Freq: Once | INTRAVENOUS | Status: AC
  Administered 2022-04-18: 1800 [IU] via INTRAVENOUS
  Filled 2022-04-18: qty 1800

## 2022-04-18 MED ORDER — ENSURE ENLIVE PO LIQD: 237.0000 mL | Freq: Two times a day (BID) | ORAL | Status: DC

## 2022-04-18 MED ORDER — FLUOXETINE HCL 20 MG PO CAPS
20.0000 mg | ORAL_CAPSULE | Freq: Every day | ORAL | Status: DC
  Administered 2022-04-18: 20 mg via ORAL
  Filled 2022-04-18: qty 1

## 2022-04-18 MED ORDER — DIPHENHYDRAMINE HCL 50 MG/ML IJ SOLN
12.5000 mg | Freq: Three times a day (TID) | INTRAMUSCULAR | Status: DC | PRN
  Administered 2022-04-18: 12.5 mg via INTRAVENOUS
  Filled 2022-04-18: qty 1

## 2022-04-18 MED ORDER — INSULIN ASPART 100 UNIT/ML IJ SOLN
0.0000 [IU] | Freq: Three times a day (TID) | INTRAMUSCULAR | Status: DC
  Administered 2022-04-18: 2 [IU] via SUBCUTANEOUS
  Administered 2022-04-18: 3 [IU] via SUBCUTANEOUS
  Filled 2022-04-18 (×3): qty 1

## 2022-04-18 MED ORDER — AMLODIPINE BESYLATE 10 MG PO TABS
10.0000 mg | ORAL_TABLET | Freq: Every day | ORAL | Status: DC
  Administered 2022-04-18 – 2022-04-19 (×2): 10 mg via ORAL
  Filled 2022-04-18: qty 2
  Filled 2022-04-18: qty 1

## 2022-04-18 MED ORDER — DM-GUAIFENESIN ER 30-600 MG PO TB12: 1.0000 | ORAL_TABLET | Freq: Two times a day (BID) | ORAL | Status: DC | PRN

## 2022-04-18 MED ORDER — HYDRALAZINE HCL 20 MG/ML IJ SOLN: 5.0000 mg | INTRAMUSCULAR | Status: DC | PRN

## 2022-04-18 MED ORDER — HEPARIN BOLUS VIA INFUSION
4000.0000 [IU] | Freq: Once | INTRAVENOUS | Status: AC
  Administered 2022-04-18: 4000 [IU] via INTRAVENOUS
  Filled 2022-04-18: qty 4000

## 2022-04-18 MED ORDER — ACETAMINOPHEN 325 MG PO TABS: 650.0000 mg | ORAL_TABLET | Freq: Four times a day (QID) | ORAL | Status: DC | PRN

## 2022-04-18 MED ORDER — LISINOPRIL 20 MG PO TABS
20.0000 mg | ORAL_TABLET | Freq: Every day | ORAL | Status: DC
  Administered 2022-04-18 – 2022-04-19 (×2): 20 mg via ORAL
  Filled 2022-04-18: qty 2
  Filled 2022-04-18: qty 1

## 2022-04-18 MED ORDER — INSULIN DETEMIR 100 UNIT/ML ~~LOC~~ SOLN
20.0000 [IU] | Freq: Every day | SUBCUTANEOUS | Status: DC
  Filled 2022-04-18: qty 0.2

## 2022-04-18 MED ORDER — PANTOPRAZOLE SODIUM 40 MG PO TBEC
40.0000 mg | DELAYED_RELEASE_TABLET | Freq: Every day | ORAL | Status: DC
  Administered 2022-04-18 – 2022-04-19 (×2): 40 mg via ORAL
  Filled 2022-04-18 (×2): qty 1

## 2022-04-18 MED ORDER — METOCLOPRAMIDE HCL 5 MG/ML IJ SOLN
10.0000 mg | Freq: Once | INTRAMUSCULAR | Status: AC
  Administered 2022-04-18: 10 mg via INTRAVENOUS
  Filled 2022-04-18: qty 2

## 2022-04-18 NOTE — ED Triage Notes (Addendum)
Reports abd pain with n/v since yesterday morning. Pt reports receiving treatment for liver cancer. Denies fever or diarrhea. Pt reports feeling dizzy and weak. Pt alert and oriented. Pt breathing unlabored speaking in full sentences. Pt ambulatory to triage after driving self here. Pt taken to room in wheelchair.

## 2022-04-18 NOTE — ED Notes (Signed)
Family at bedside. 

## 2022-04-18 NOTE — ED Notes (Signed)
Pt given breakfast tray

## 2022-04-18 NOTE — Progress Notes (Signed)
[place] for heparin infusion Indication: pulmonary embolus  Allergies  Allergen Reactions   Ibuprofen Swelling   Penicillins Swelling and Hives    lips Other reaction(s): Other (See Comments) Edema (lips)    Patient Measurements: Height: 6' (182.9 cm) Weight: 61.2 kg (135 lb) IBW/kg (Calculated) : 77.6 Heparin Dosing Weight: 61.2 kg  Vital Signs: Temp: 98 F (36.7 C) (03/08 1300) Temp Source: Oral (03/08 1300) BP: 124/68 (03/08 1340) Pulse Rate: 61 (03/08 0850)  Labs: Recent Labs    04/18/22 0254 04/18/22 0337 04/18/22 1429  HGB 11.3*  --   --   HCT 36.3*  --   --   PLT 139*  --   --   APTT  --   --  40*  LABPROT  --  15.0  --   INR  --  1.2  --   HEPARINUNFRC  --   --  0.10*  CREATININE 1.21  --   --   TROPONINIHS 27*  --   --      Estimated Creatinine Clearance: 33.7 mL/min (by C-G formula based on SCr of 1.21 mg/dL).   Medical History: Past Medical History:  Diagnosis Date   Actinic keratosis    Arrhythmia    A-Fib   BPH (benign prostatic hyperplasia)    Coronary artery disease 02/11/2007   CABG X 3  @ [facility]   CVA (cerebral infarction) 02/11/2012   Diabetes mellitus without complication (HCC)    Enlarged prostate    GERD (gastroesophageal reflux disease)    Hyperlipidemia    Hypertension    Kidney stone    Renal disorder    Skin cancer    scalp, tx by Dr Sharlett Iles 2000s?    Assessment: Pt is a 89 yo male presenting to ED c/o abdominal pain with n/v, reporting receiving tx for liver cancer, found "positive for segmental right upper lobe pulmonary emboli."  Goal of Therapy:  Heparin level 0.3-0.7 units/ml Monitor platelets by anticoagulation protocol: Yes   03/08 1429 HL 0.10, subtherapeutic   Plan: HL subtherapeutic Give heparin 1800 units IV x 1 Start heparin infusion at 1200 units/hr Will check HL in 8 hr after rate change CBC daily while on heparin  Tollie Eth III,  PharmD 04/18/2022 3:01 PM

## 2022-04-18 NOTE — ED Notes (Signed)
RN at bedside again to reposition IV for pump reading occulusion. RN offering to start another IV not located in [facility] of arm. Pt declined 2nd IV. Pt family now at bedside.

## 2022-04-18 NOTE — ED Provider Notes (Signed)
[facility] Provider Note    Event Date/Time   First MD Initiated Contact with Patient 04/18/22 0244     (approximate)   History   Abdominal Pain, Emesis, and Weakness   HPI  Randy Frazier is a 89 y.o. male who presents to the ED for evaluation of Abdominal Pain, Emesis, and Weakness   I reviewed 2/27 oncology clinic visit.  History of [place] and NASH.  Still talking about starting treatment for his recently diagnosed [place].  Locally advanced multifocal.,  Stage IIIa.  Incidentally found CLL He otherwise has a history of CAD s/p CABG on DAPT, DM, HTN, HLD  Patient presents for evaluation of nausea and emesis.  He reports feeling "so sick" for the past 24 hours or so.  Reports continued epigastric pain that he attributes to his cancer diagnosis.  Denies any chest pain, syncope or fever   Physical Exam   Triage Vital Signs: ED Triage Vitals  Enc Vitals Group     BP 04/18/22 0243 (!) 156/83     Pulse Rate 04/18/22 0243 98     Resp --      Temp 04/18/22 0243 (!) 97.5 F (36.4 C)     Temp Source 04/18/22 0243 Oral     SpO2 04/18/22 0243 100 %     Weight 04/18/22 0241 135 lb (61.2 kg)     Height 04/18/22 0241 6' (1.829 m)     Head Circumference --      Peak Flow --      Pain Score 04/18/22 0241 4     Pain Loc --      Pain Edu? --      Excl. in [place]? --     Most recent vital signs: Vitals:   04/18/22 0300 04/18/22 0306  BP: 133/74   Pulse: 88   Resp: 12   Temp:    SpO2: 91% 91%    General: Awake, no distress.  CV:  Good peripheral perfusion.  Resp:  Normal effort.  Abd:  No distention.  MSK:  No deformity noted.  Neuro:  No focal deficits appreciated. Other:     ED Results / Procedures / Treatments   Labs (all labs ordered are listed, but only abnormal results are displayed) Labs Reviewed  COMPREHENSIVE METABOLIC PANEL - Abnormal; Notable for the following components:      Result Value   Sodium 131 (*)    Glucose, Bld 237 (*)     Calcium 8.8 (*)    Albumin 3.1 (*)    AST 91 (*)    ALT 65 (*)    Alkaline Phosphatase 329 (*)    GFR, Estimated 56 (*)    All other components within normal limits  CBC - Abnormal; Notable for the following components:   WBC 17.7 (*)    Hemoglobin 11.3 (*)    HCT 36.3 (*)    MCH 25.8 (*)    RDW 22.5 (*)    Platelets 139 (*)    All other components within normal limits  TROPONIN I (HIGH SENSITIVITY) - Abnormal; Notable for the following components:   Troponin I (High Sensitivity) 27 (*)    All other components within normal limits  RESP PANEL BY RT-PCR (RSV, FLU A&B, COVID)  RVPGX2  LIPASE, BLOOD  PROTIME-INR  URINALYSIS, ROUTINE W REFLEX MICROSCOPIC  APTT  BASIC METABOLIC PANEL  CBC    EKG Sinus rhythm with a rate of 89 bpm.  Normal axis.  Right  bundle.  No STEMI.  RADIOLOGY CTA chest interpreted by me with PE CT abdomen/pelvis without SBO  Official radiology report(s): CT Angio Chest PE W and/or Wo Contrast  Addendum Date: 04/18/2022   ADDENDUM REPORT: 04/18/2022 04:35 ADDENDUM: Study discussed by telephone with Dr. Vladimir Crofts on 04/18/2022 at 0423 hours. Electronically Signed   By: Genevie Ann M.D.   On: 04/18/2022 04:35   Result Date: 04/18/2022 CLINICAL DATA:  89 year old male with stage III hepatocellular carcinoma. Shortness of breath and hypoxia. Abdominal pain, nausea vomiting. EXAM: CT ANGIOGRAPHY CHEST WITH CONTRAST TECHNIQUE: Multidetector CT imaging of the chest was performed using the standard protocol during bolus administration of intravenous contrast. Multiplanar CT image reconstructions and MIPs were obtained to evaluate the vascular anatomy. RADIATION DOSE REDUCTION: This exam was performed according to the departmental dose-optimization program which includes automated exposure control, adjustment of the mA and/or kV according to patient size and/or use of iterative reconstruction technique. CONTRAST:  54m OMNIPAQUE IOHEXOL 350 MG/ML SOLN COMPARISON:  CT  Abdomen and Pelvis today reported separately. CTA chest 01/01/2022. FINDINGS: Cardiovascular: Excellent contrast bolus timing in the pulmonary arterial tree. Segmental right upper lobe pulmonary artery thrombus is confluent and tracks distally in the regional vessels series 5, image 153. No lobar or saddle embolus. No other significant pulmonary artery filling defect. Chronic CABG. Stable mild cardiomegaly. No pericardial effusion. Calcified aortic atherosclerosis. No contrast in the aorta. Mediastinum/Nodes: Multiple maximal mediastinal lymph nodes up to 10 mm short axis do appear mildly increased since November and are indeterminate. Lungs/Pleura: Major airways remain patent. No right upper lobe pulmonary infarct. No significant pleural effusion. Bilateral costophrenic angle sub solid and dependent opacity is probably increased atelectasis. Infection is felt less likely due to the degree of symmetry. No suspicious pulmonary nodule identified at this time. Upper Abdomen: CT Abdomen and Pelvis reported separately. Musculoskeletal: Chronic sternotomy. Stable visualized osseous structures. No acute or destructive osseous lesion identified. Review of the MIP images confirms the above findings. IMPRESSION: 1. Positive for Segmental right upper lobe Pulmonary Emboli. No lobar or saddle embolus. No right upper lobe pulmonary infarct or pleural effusion at this time. 2. Borderline to mildly enlarged mediastinal lymph nodes are indeterminate. But no other metastatic disease identified in the chest. 3. CT Abdomen and Pelvis today reported separately. Electronically Signed: By: HGenevie AnnM.D. On: 04/18/2022 04:22   CT ABDOMEN PELVIS W CONTRAST  Result Date: 04/18/2022 CLINICAL DATA:  89year old male with stage III hepatocellular carcinoma. Shortness of breath and hypoxia. Abdominal pain, nausea vomiting. Right upper lobe pulmonary emboli on CTA chest today. EXAM: CT ABDOMEN AND PELVIS WITH CONTRAST TECHNIQUE: Multidetector  CT imaging of the abdomen and pelvis was performed using the standard protocol following bolus administration of intravenous contrast. RADIATION DOSE REDUCTION: This exam was performed according to the departmental dose-optimization program which includes automated exposure control, adjustment of the mA and/or kV according to patient size and/or use of iterative reconstruction technique. CONTRAST:  74mOMNIPAQUE IOHEXOL 350 MG/ML SOLN COMPARISON:  CTA chest today reported separately. Noncontrast CT Abdomen and Pelvis 01/27/2022. Abdomen MRI 02/11/2022. FINDINGS: Lower chest: Stable to that reported separately today. Hepatobiliary: Heterogeneous mass of the left hepatic lobe in the midline, subxiphoid location appears increased in heterogeneity, with central cystic or necrotic change, since December. This distorts the liver capsule. Overall size and configuration probably not significantly changed. Underlying liver nodularity. Evidence of a new or increased 16 mm hypodense lesion in the inferior right hepatic lobe since the January  MRI. New perihepatic ascites, small volume. Absent gallbladder. Stable extrahepatic bile ducts. Pancreas: Stable, negative. Spleen: Small volume new perisplenic ascites, otherwise stable. Adrenals/Urinary Tract: Suspicious left adrenal nodule is 1.7 cm now, increased by 3 mm since December. Contralateral right adrenal gland remains normal. Nonobstructed kidneys. Large exophytic but simple fluid density and benign appearing renal cysts (no follow-up imaging recommended). Symmetric renal contrast excretion on the delayed images. No hydroureter. Diminutive bladder. Incidental pelvic phleboliths. Stomach/Bowel: Small volume but largely new abdominal ascites since December with simple fluid density. Chronic diverticulosis of the descending and sigmoid colon. No definite active inflammation. Lesser diverticula of the transverse colon. No convincing acute large bowel inflammation. Appendix  appears to remain normal on series 5, image 72. No dilated small bowel. No pneumoperitoneum. Proximal duodenum diverticulum is about 2 cm on series 5, image 44. No associated inflammation. Duodenum and stomach otherwise mostly decompressed. Edema at the root of the small bowel mesentery is similar to December. There is regional mesenteric lymphadenopathy which does not appear significantly changed. Vascular/Lymphatic: Diffuse Aortoiliac calcified atherosclerosis. Major arterial structures in the abdomen and pelvis remain patent. The main portal vein, splenic vein, SMV, and tributaries appear to remain patent. Intrahepatic portal vein thrombosis is not excluded. Moderate lymphadenopathy in the upper abdomen centered at the gastrohepatic ligament and celiac axis is probably metastatic disease and appears increased since December. Reproductive: Prostatomegaly. Other: Pelvic ascites with simple fluid density is increased. Small to moderate volume. Musculoskeletal: Subtle L2 inferior endplate compression compared to December. No obvious lumbar metastases on the January MRI. And elsewhere spinal bone mineralization appears stable since December. No other acute or suspicious osseous lesion. IMPRESSION: 1. Metastatic Hepatocellular Carcinoma, mildly progressive CT appearance since December. Small volume new abdominal and pelvic ascites since that time. Dominant left hepatic lobe mass. Questionable new or increased 16 mm right hepatic lobe lesion since the January MRI (series 5, image 44). Gastrohepatic ligament and other upper abdominal lymphadenopathy. 2. Subtle L2 inferior endplate compression fracture appears new since December, but is probably a benign osteoporotic fracture in light of no obvious lumbar metastases by MRI in January. Query new back pain. 3. See abnormal Chest CTA today reported separately. Aortic Atherosclerosis (ICD10-I70.0). Electronically Signed   By: Genevie Ann M.D.   On: 04/18/2022 04:35     PROCEDURES and INTERVENTIONS:  .1-3 Lead EKG Interpretation  Performed by: Vladimir Crofts, MD Authorized by: Vladimir Crofts, MD     Interpretation: normal     ECG rate:  86   ECG rate assessment: normal     Rhythm: sinus rhythm     Ectopy: none     Conduction: normal   .Critical Care  Performed by: Vladimir Crofts, MD Authorized by: Vladimir Crofts, MD   Critical care provider statement:    Critical care time (minutes):  30   Critical care time was exclusive of:  Separately billable procedures and treating other patients   Critical care was necessary to treat or prevent imminent or life-threatening deterioration of the following conditions:  Circulatory failure, cardiac failure and respiratory failure   Critical care was time spent personally by me on the following activities:  Development of treatment plan with patient or surrogate, discussions with consultants, evaluation of patient's response to treatment, examination of patient, ordering and review of laboratory studies, ordering and review of radiographic studies, ordering and performing treatments and interventions, pulse oximetry, re-evaluation of patient's condition and review of old charts   Medications  heparin ADULT infusion 100 units/mL (25000  units/224m) (1,000 Units/hr Intravenous New Bag/Given 04/18/22 0534)  aspirin EC tablet 81 mg (has no administration in time range)  oxyCODONE (Oxy IR/ROXICODONE) immediate release tablet 5 mg (has no administration in time range)  amLODipine (NORVASC) tablet 10 mg (has no administration in time range)  atorvastatin (LIPITOR) tablet 10 mg (has no administration in time range)  ezetimibe (ZETIA) tablet 10 mg (has no administration in time range)  lisinopril (ZESTRIL) tablet 20 mg (has no administration in time range)  metoprolol tartrate (LOPRESSOR) tablet 25 mg (has no administration in time range)  FLUoxetine (PROZAC) capsule 20 mg (has no administration in time range)  hydrOXYzine  (ATARAX) tablet 10 mg (has no administration in time range)  prochlorperazine (COMPAZINE) tablet 10 mg (has no administration in time range)  traZODone (DESYREL) tablet 50 mg (has no administration in time range)  insulin detemir (LEVEMIR) injection 16 Units (has no administration in time range)  pantoprazole (PROTONIX) EC tablet 40 mg (has no administration in time range)  clopidogrel (PLAVIX) tablet 75 mg (has no administration in time range)  gabapentin (NEURONTIN) capsule 300 mg (has no administration in time range)  Magnesium TABS 500 mg (has no administration in time range)  acetaminophen (TYLENOL) tablet 650 mg (has no administration in time range)    Or  acetaminophen (TYLENOL) suppository 650 mg (has no administration in time range)  magnesium hydroxide (MILK OF MAGNESIA) suspension 30 mL (has no administration in time range)  ondansetron (ZOFRAN) tablet 4 mg (has no administration in time range)    Or  ondansetron (ZOFRAN) injection 4 mg (has no administration in time range)  traZODone (DESYREL) tablet 25 mg (has no administration in time range)  ondansetron (ZOFRAN) injection 4 mg (4 mg Intravenous Given 04/18/22 0304)  lactated ringers bolus 1,000 mL (0 mLs Intravenous Stopped 04/18/22 0441)  metoCLOPramide (REGLAN) injection 10 mg (10 mg Intravenous Given 04/18/22 0335)  morphine (PF) 4 MG/ML injection 4 mg (4 mg Intravenous Given 04/18/22 0335)  iohexol (OMNIPAQUE) 350 MG/ML injection 75 mL (75 mLs Intravenous Contrast Given 04/18/22 0355)  heparin bolus via infusion 4,000 Units (4,000 Units Intravenous Bolus from Bag 04/18/22 0535)     IMPRESSION / MDM / [place]/ ED COURSE  I reviewed the triage vital signs and the nursing notes.  Differential diagnosis includes, but is not limited to, ACS, PTX, PNA, muscle strain/spasm, PE, dissection  {Patient presents with symptoms of an acute illness or injury that is potentially life-threatening.  89year old cancer patient who  has not yet started treatment presents with nausea, emesis and hypoxia with evidence of new PE requiring anticoagulation and medical admission.  Initially presents with normal vital signs, but developed hypoxia during his stay.  Due to this, and his active cancer, CTA chest obtained that does have evidence of PE without pneumonia, pneumothorax or pleural effusion.  Blood work with leukocytosis, but no clear signs of infectious etiology of his symptoms.  Troponin is slightly elevated and we will trend this.  EKG is nonischemic.  We initiate heparinization and a consult medicine for admission  Clinical Course as of 04/18/22 0537  Fri Apr 18, 2022  0423 Call from rads. Small PE [DS]  [id number] have put the patient of this and my recommendations for admission and he is agreeable. [DS]    Clinical Course User Index [DS] SVladimir Crofts MD     FINAL CLINICAL IMPRESSION(S) / ED DIAGNOSES   Final diagnoses:  Hypoxia  Single subsegmental pulmonary embolism without acute  cor pulmonale ([place])     Rx / DC Orders   ED Discharge Orders     None        Note:  This document was prepared using Dragon voice recognition software and may include unintentional dictation errors.   Vladimir Crofts, MD 04/18/22 [phone]

## 2022-04-18 NOTE — Progress Notes (Signed)
Initial Nutrition Assessment  DOCUMENTATION CODES:   Underweight  INTERVENTION:   -Liberalize diet to carb modified for wider variety of meal selections -MVI with minerals daily -Continue Ensure Enlive po BID, each supplement provides 350 kcal and 20 grams of protein -Magic cup TID with meals, each supplement provides 290 kcal and 9 grams of protein   NUTRITION DIAGNOSIS:   Increased nutrient needs related to cancer and cancer related treatments as evidenced by estimated needs.  GOAL:   Patient will meet greater than or equal to 90% of their needs  MONITOR:   PO intake, Supplement acceptance  REASON FOR ASSESSMENT:   Consult Assessment of nutrition requirement/status  ASSESSMENT:   Pt with medical history significant of  CAD s/p CABG on DAPT, DM, HTN, HLD, stroke, gout, GERD, depression with anxiety, BPH, CKD-3a, recently diagnosed hepatocellular carcinoma and CLL, thrombocytopenia, who presents with nausea, vomiting, abdominal pain.  Pt admitted with acute pulmonary embolism.   Reviewed I/O's: +800 ml x 24 hours  UOP: 200 ml x 24 hours   Pt unavailable at time of visit. Attempted to speak with pt via call to hospital room phone, however, unable to reach. RD unable to obtain further nutrition-related history or complete nutrition-focused physical exam at this time.    Per MD notes, pt recently diagnosed with hepatocellular carcinoma, who was about to start chemotherapy treatments at [facility]. Pt has experienced abdominal pain, nausea, diarrhea, and vomiting for 1 day PTA.   Reviewed wt hx; pt has experienced a 14.4% wt loss over the past month, which is significant for time frame.   Highly suspect pt with malnutrition, however, unable to identify at this time. Pt with poor oral intake and would benefit from nutrient dense supplement. One Ensure Enlive supplement provides 350 kcals, 20 grams protein, and 44-45 grams of carbohydrate vs one Glucerna shake supplement, which  provides 220 kcals, 10 grams of protein, and 26 grams of carbohydrate. Given pt's hx of DM, RD will reassess adequacy of PO intake, CBGS, and adjust supplement regimen as appropriate at follow-up.    Medications reviewed and include magnesium oxide.   Lab Results  Component Value Date   HGBA1C 7.3 (H) 12/08/2021   PTA DM medications are 26 units insulin detemir daily, 20 units insulin lispro BID, and 500 mg metformin BID. Per ADA's Standards of Medical Care for Diabetes, glycemic targets for elderly patients who are otherwise healthy (few medical impairments) and cognitively intact should be less stringent (Hgb A1c <7.5).    Labs reviewed: Na: 131, Phos: 2.3, Mg: 1.6, CBGS: 191-217 (inpatient orders for glycemic control are 0-5 units insulin aspart daily at bedtime, 0-9 units insulin aspart TID with meals, and 16 units insulin detemir daily).    Diet Order:   Diet Order             Diet heart healthy/carb modified Room service appropriate? Yes; Fluid consistency: Thin  Diet effective now                   EDUCATION NEEDS:   Not appropriate for education at this time  Skin:  Skin Assessment: Reviewed RN Assessment  Last BM:  Unknown  Height:   Ht Readings from Last 1 Encounters:  04/18/22 6' (1.829 m)    Weight:   Wt Readings from Last 1 Encounters:  04/18/22 61.2 kg    Ideal Body Weight:  80.9 kg  BMI:  Body mass index is 18.31 kg/m.  Estimated Nutritional Needs:   Kcal:  [id number]  Protein:  110-125 grams  Fluid:  > 1.9 L    Loistine Chance, RD, LDN, [place] Registered Dietitian II Certified Diabetes Care and Education Specialist Please refer to [facility] for RD and/or RD on-call/weekend/after hours pager

## 2022-04-18 NOTE — H&P (Signed)
History and Physical    Randy Frazier [id number] DOB: 04/26/1927 DOA: 04/18/2022  Referring MD/NP/PA:   PCP: Pcp, No   Patient coming from:  The patient is coming from home.     Chief Complaint: Nausea, vomiting, abdominal pain  HPI: Randy Frazier is a 89 y.o. male with medical history significant of  CAD s/p CABG on DAPT, DM, HTN, HLD, stroke, gout, GERD, depression with anxiety, BPH, CKD-3a, recently diagnosed hepatocellular carcinoma and CLL, thrombocytopenia, who presents with nausea, vomiting, abdominal pain.  Patient was recently diagnosed with hepatocellular carcinoma Stage IIIa and CLL.  Patient was referred to [facility], planning to start chemotherapy, but not yet. He states that he has abdominal pain, associated with nausea and few episodes of nonbilious nonbloody vomiting since yesterday.  No diarrhea.  Abdominal is located mainly in the right upper quadrant, constant, aching, mild, nonradiating.  Not aggravated or alleviated by any known factors. Patient denies cough, shortness breath or chest pain, but was found to have oxygen desaturation to 88% on room air which improved to 91-100% on 2 L oxygen in ED.  Normally patient is not using oxygen at baseline.  No fever or chills.  Denies symptoms of UTI.  No recent fall or head injury.  No dark stool or rectal bleeding.  Data reviewed independently and ED Course: pt was found to have WBC 17.7, troponin level 27, stable renal function, INR 1.2, negative PCR for COVID, flu, RSV.  Temperature 97.5, blood pressure 133/74, heart rate 98, RR 20.  [name] CTA showed segmental right upper lobe Pulmonary Emboli. LE venous Doppler negative for DVT. Patient is admitted to PCU as inpatient  CTA: 1. Positive for Segmental right upper lobe Pulmonary Emboli. No lobar or saddle embolus. No right upper lobe pulmonary infarct or pleural effusion at this time. 2. Borderline to mildly enlarged mediastinal lymph nodes are indeterminate. But no other  metastatic disease identified in the chest. 3. CT Abdomen and Pelvis today reported separately.  CT-abd/pelvis: 1. Metastatic Hepatocellular Carcinoma, mildly progressive CT appearance since December. Small volume new abdominal and pelvic ascites since that time. Dominant left hepatic lobe mass. Questionable new or increased 16 mm right hepatic lobe lesion since the January MRI (series 5, image 44). Gastrohepatic ligament and other upper abdominal lymphadenopathy.   2. Subtle L2 inferior endplate compression fracture appears new since December, but is probably a benign osteoporotic fracture in light of no obvious lumbar metastases by MRI in January. Query new back pain.   3. See abnormal Chest CTA today reported separately.   Aortic Atherosclerosis (ICD10-I70.0).  EKG: I have personally reviewed.  Sinus rhythm, QTc 513, bifascicular block, low voltage, poor R wave progression, occasional PVC   Review of Systems:   General: no fevers, chills, no body weight gain, has fatigue HEENT: no blurry vision, hearing changes or sore throat Respiratory: no dyspnea, coughing, wheezing CV: no chest pain, no palpitations GI: has nausea, vomiting, abdominal pain, no diarrhea, constipation GU: no dysuria, burning on urination, increased urinary frequency, hematuria  Ext: no leg edema Neuro: no unilateral weakness, numbness, or tingling, no vision change or hearing loss Skin: no rash, no skin tear. MSK: No muscle spasm, no deformity, no limitation of range of movement in spin Heme: No easy bruising.  Travel history: No recent long distant travel.   Allergy:  Allergies  Allergen Reactions   Ibuprofen Swelling   Penicillins Swelling and Hives    lips Other reaction(s): Other (See Comments) Edema (lips)  Past Medical History:  Diagnosis Date   Actinic keratosis    Arrhythmia    A-Fib   BPH (benign prostatic hyperplasia)    Coronary artery disease 02/11/2007   CABG X 3  @ [facility]    CVA (cerebral infarction) 02/11/2012   Diabetes mellitus without complication (HCC)    Enlarged prostate    GERD (gastroesophageal reflux disease)    Hyperlipidemia    Hypertension    Kidney stone    Renal disorder    Skin cancer    scalp, tx by Dr Sharlett Iles 2000s?    Past Surgical History:  Procedure Laterality Date   cataract surgery Bilateral    CORONARY ARTERY BYPASS GRAFT  2009   CABG X 3 @ [facility]   LIVER BIOPSY  01/06/2022   Procedure: LIVER BIOPSY;  Surgeon: Ronny Bacon, MD;  Location: [facility] ORS;  Service: General;;   TEAR DUCT PROBING Right 05/07/2016   Procedure: Evacuation of hematoma, right peri-orbital bleed.;  Surgeon: Clista Bernhardt, MD;  Location: [facility];  Service: Ophthalmology;  Laterality: Right;    Social History:  reports that he has never smoked. He has never used smokeless tobacco. He reports that he does not drink alcohol and does not use drugs.  Family History:  Family History  Family history unknown: Yes     Prior to Admission medications   Medication Sig Start Date End Date Taking? Authorizing Provider  amLODipine (NORVASC) 10 MG tablet TAKE 1 TABLET BY MOUTH EVERY DAY 10/03/12  Yes Gollan, Kathlene November, MD  aspirin EC 81 MG tablet Take 81 mg by mouth.   Yes [provider]  atorvastatin (LIPITOR) 10 MG tablet Take 1 tablet (10 mg total) by mouth every other day. 12/05/19  Yes Minna Merritts, MD  clopidogrel (PLAVIX) 75 MG tablet TAKE 1 TABLET(75 MG) BY MOUTH DAILY 08/20/17  Yes Gollan, Kathlene November, MD  ezetimibe (ZETIA) 10 MG tablet Take 10 mg by mouth daily.   Yes [provider]  FLUoxetine (PROZAC) 20 MG capsule Take 20 mg by mouth daily.   Yes [provider]  gabapentin (NEURONTIN) 300 MG capsule Take 300 mg by mouth 3 (three) times daily.  01/24/12  Yes [provider]  insulin detemir (LEVEMIR) 100 UNIT/ML injection Inject 0.16 mLs (16 Units total) into the skin daily. Patient taking differently: Inject 26  Units into the skin daily. 01/09/22  Yes Sharen Hones, MD  insulin lispro (HUMALOG) 100 UNIT/ML injection Inject 20 Units into the skin 2 (two) times daily. 05/12/16  Yes [provider]  lisinopril (PRINIVIL,ZESTRIL) 20 MG tablet Take 20 mg by mouth daily. 01/27/13  Yes [provider]  Magnesium 500 MG TABS Take 500 mg by mouth daily.   Yes [provider]  metFORMIN (GLUCOPHAGE-XR) 500 MG 24 hr tablet Take 500 mg by mouth 2 (two) times daily with a meal. 05/12/16  Yes [provider]  metoprolol tartrate (LOPRESSOR) 25 MG tablet Take 25 mg by mouth 2 (two) times daily.   Yes [provider]  nystatin (MYCOSTATIN) 100000 UNIT/ML suspension Take 5 mLs by mouth 4 (four) times daily. 01/15/22  Yes [provider]  pantoprazole (PROTONIX) 40 MG tablet Take 1 tablet (40 mg total) by mouth daily. 01/10/22 04/18/22 Yes Sharen Hones, MD  traZODone (DESYREL) 50 MG tablet Take 1 tablet (50 mg total) by mouth at bedtime. 04/08/22  Yes Sindy Guadeloupe, MD  hydrOXYzine (ATARAX) 10 MG tablet Take 1 tablet (10 mg total)  by mouth daily as needed for itching. 03/24/22   Verlon Au, NP  lidocaine-prilocaine (EMLA) cream Apply to affected area once 01/26/22   Sindy Guadeloupe, MD  ondansetron (ZOFRAN) 8 MG tablet Take 1 tablet (8 mg total) by mouth every 8 (eight) hours as needed for nausea or vomiting. 01/26/22   Sindy Guadeloupe, MD  oxyCODONE (OXY IR/ROXICODONE) 5 MG immediate release tablet Take 1 tablet (5 mg total) by mouth every 4 (four) hours as needed for moderate pain. 01/09/22   Sharen Hones, MD  prochlorperazine (COMPAZINE) 10 MG tablet Take 1 tablet (10 mg total) by mouth every 6 (six) hours as needed for nausea or vomiting. 01/26/22   Sindy Guadeloupe, MD    Physical Exam: Vitals:   04/18/22 0306 04/18/22 0800 04/18/22 0850 04/18/22 0931  BP:      Pulse:  73 61   Resp:  '16 16 19  '$ Temp:   97.9 F (36.6 C)   TempSrc:   Oral   SpO2: 91% 100% 95%   Weight:       Height:       General: Not in acute distress HEENT:       Eyes: PERRL, EOMI, no scleral icterus.       ENT: No discharge from the ears and nose, no pharynx injection, no tonsillar enlargement.        Neck: No JVD, no bruit, no mass felt. Heme: No neck lymph node enlargement. Cardiac: S1/S2, RRR, No murmurs, No gallops or rubs. Respiratory: No rales, wheezing, rhonchi or rubs. GI: Soft, nondistended, has RUQ tenderness, no rebound pain, no organomegaly, BS present. GU: No hematuria Ext: No pitting leg edema bilaterally. 1+DP/PT pulse bilaterally. Musculoskeletal: No joint deformities, No joint redness or warmth, no limitation of ROM in spin. Skin: No rashes.  Neuro: Alert, oriented X3, cranial nerves II-XII grossly intact, moves all extremities normally.  Psych: Patient is not psychotic, no suicidal or hemocidal ideation.  Labs on Admission: I have personally reviewed following labs and imaging studies  CBC: Recent Labs  Lab 04/18/22 0254  WBC 17.7*  HGB 11.3*  HCT 36.3*  MCV 82.9  PLT [id number]*   Basic Metabolic Panel: Recent Labs  Lab 04/18/22 0254  NA 131*  K 4.4  CL 98  CO2 23  GLUCOSE 237*  BUN 23  CREATININE 1.21  CALCIUM 8.8*   GFR: Estimated Creatinine Clearance: 33.7 mL/min (by C-G formula based on SCr of 1.21 mg/dL). Liver Function Tests: Recent Labs  Lab 04/18/22 0254  AST 91*  ALT 65*  ALKPHOS 329*  BILITOT 1.1  PROT 8.0  ALBUMIN 3.1*   Recent Labs  Lab 04/18/22 0254  LIPASE 32   No results for input(s): "AMMONIA" in the last 168 hours. Coagulation Profile: Recent Labs  Lab 04/18/22 0337  INR 1.2   Cardiac Enzymes: No results for input(s): "CKTOTAL", "CKMB", "CKMBINDEX", "TROPONINI" in the last 168 hours. BNP (last 3 results) No results for input(s): "PROBNP" in the last 8760 hours. HbA1C: No results for input(s): "HGBA1C" in the last 72 hours. CBG: Recent Labs  Lab 04/18/22 0832  GLUCAP 191*   Lipid Profile: No results for  input(s): "CHOL", "HDL", "LDLCALC", "TRIG", "CHOLHDL", "LDLDIRECT" in the last 72 hours. Thyroid Function Tests: No results for input(s): "TSH", "T4TOTAL", "FREET4", "T3FREE", "THYROIDAB" in the last 72 hours. Anemia Panel: No results for input(s): "VITAMINB12", "FOLATE", "FERRITIN", "TIBC", "IRON", "RETICCTPCT" in the last 72 hours. Urine analysis:    Component Value Date/Time  COLORURINE YELLOW (A) 02/14/2022 1350   APPEARANCEUR CLEAR (A) 02/14/2022 1350   APPEARANCEUR Clear 03/30/2013 0839   LABSPEC 1.017 02/14/2022 1350   LABSPEC 1.023 03/30/2013 0839   PHURINE 5.0 02/14/2022 1350   GLUCOSEU NEGATIVE 02/14/2022 1350   GLUCOSEU 150 mg/dL 03/30/2013 0839   HGBUR NEGATIVE 02/14/2022 1350   BILIRUBINUR NEGATIVE 02/14/2022 1350   BILIRUBINUR Negative 03/30/2013 0839   KETONESUR NEGATIVE 02/14/2022 1350   PROTEINUR NEGATIVE 02/14/2022 1350   NITRITE NEGATIVE 02/14/2022 1350   LEUKOCYTESUR NEGATIVE 02/14/2022 1350   LEUKOCYTESUR Negative 03/30/2013 0839   Sepsis Labs: '@LABRCNTIP'$ (procalcitonin:4,lacticidven:4) ) Recent Results (from the past 240 hour(s))  Resp panel by RT-PCR (RSV, Flu A&B, Covid) Anterior Nasal Swab     Status: None   Collection Time: 04/18/22  2:58 AM   Specimen: Anterior Nasal Swab  Result Value Ref Range Status   SARS Coronavirus 2 by RT PCR NEGATIVE NEGATIVE Final    Comment: (NOTE) SARS-CoV-2 target nucleic acids are NOT DETECTED.  The SARS-CoV-2 RNA is generally detectable in upper respiratory specimens during the acute phase of infection. The lowest concentration of SARS-CoV-2 viral copies this assay can detect is 138 copies/mL. A negative result does not preclude SARS-Cov-2 infection and should not be used as the sole basis for treatment or other patient management decisions. A negative result may occur with  improper specimen collection/handling, submission of specimen other than nasopharyngeal swab, presence of viral mutation(s) within the areas  targeted by this assay, and inadequate number of viral copies(<138 copies/mL). A negative result must be combined with clinical observations, patient history, and epidemiological information. The expected result is Negative.  Fact Sheet for Patients:  EntrepreneurPulse.com.au  Fact Sheet for Healthcare Providers:  IncredibleEmployment.be  This test is no t yet approved or cleared by the [place] FDA and  has been authorized for detection and/or diagnosis of SARS-CoV-2 by FDA under an Emergency Use Authorization (EUA). This EUA will remain  in effect (meaning this test can be used) for the duration of the COVID-19 declaration under Section 564(b)(1) of the Act, 21 U.S.C.section 360bbb-3(b)(1), unless the authorization is terminated  or revoked sooner.       Influenza A by PCR NEGATIVE NEGATIVE Final   Influenza B by PCR NEGATIVE NEGATIVE Final    Comment: (NOTE) The Xpert Xpress SARS-CoV-2/FLU/RSV plus assay is intended as an aid in the diagnosis of influenza from Nasopharyngeal swab specimens and should not be used as a sole basis for treatment. Nasal washings and aspirates are unacceptable for Xpert Xpress SARS-CoV-2/FLU/RSV testing.  Fact Sheet for Patients: EntrepreneurPulse.com.au  Fact Sheet for Healthcare Providers: IncredibleEmployment.be  This test is not yet approved or cleared by the [place] FDA and has been authorized for detection and/or diagnosis of SARS-CoV-2 by FDA under an Emergency Use Authorization (EUA). This EUA will remain in effect (meaning this test can be used) for the duration of the COVID-19 declaration under Section 564(b)(1) of the Act, 21 U.S.C. section 360bbb-3(b)(1), unless the authorization is terminated or revoked.     Resp Syncytial Virus by PCR NEGATIVE NEGATIVE Final    Comment: (NOTE) Fact Sheet for  Patients: EntrepreneurPulse.com.au  Fact Sheet for Healthcare Providers: IncredibleEmployment.be  This test is not yet approved or cleared by the [place] FDA and has been authorized for detection and/or diagnosis of SARS-CoV-2 by FDA under an Emergency Use Authorization (EUA). This EUA will remain in effect (meaning this test can be used) for the duration of the COVID-19 declaration under  Section 564(b)(1) of the Act, 21 U.S.C. section 360bbb-3(b)(1), unless the authorization is terminated or revoked.  Performed at [facility], [address] [place], [place] [address]      Radiological Exams on Admission: US Venous Img Lower Bilateral (DVT)  Result Date: 04/18/2022 CLINICAL DATA:  89 year old male with stage III hepatocellular carcinoma. Shortness of breath and right upper lobe pulmonary embolus on CTA this morning. EXAM: BILATERAL LOWER EXTREMITY VENOUS DOPPLER ULTRASOUND TECHNIQUE: Gray-scale sonography with compression, as well as color and duplex ultrasound, were performed to evaluate the deep venous system(s) from the level of the common femoral vein through the popliteal and proximal calf veins. COMPARISON:  CTA Chest, CT Abdomen and Pelvis 0404 hours today. FINDINGS: VENOUS Normal compressibility of the bilateral common femoral, superficial femoral, and popliteal veins, as well as the visualized calf veins. Visualized portions of the bilateral profunda femoral vein and great saphenous vein unremarkable. No filling defects to suggest DVT on grayscale or color Doppler imaging. Doppler waveforms show normal direction of venous flow, normal respiratory plasticity and response to augmentation. OTHER None. Limitations: none IMPRESSION: No evidence of bilateral lower extremity deep venous thrombosis. Electronically Signed   By: Genevie Ann M.D.   On: 04/18/2022 06:04   CT Angio Chest PE W and/or Wo Contrast  Addendum Date: 04/18/2022    ADDENDUM REPORT: 04/18/2022 04:35 ADDENDUM: Study discussed by telephone with Dr. Vladimir Crofts on 04/18/2022 at 0423 hours. Electronically Signed   By: Genevie Ann M.D.   On: 04/18/2022 04:35   Result Date: 04/18/2022 CLINICAL DATA:  88 year old male with stage III hepatocellular carcinoma. Shortness of breath and hypoxia. Abdominal pain, nausea vomiting. EXAM: CT ANGIOGRAPHY CHEST WITH CONTRAST TECHNIQUE: Multidetector CT imaging of the chest was performed using the standard protocol during bolus administration of intravenous contrast. Multiplanar CT image reconstructions and MIPs were obtained to evaluate the vascular anatomy. RADIATION DOSE REDUCTION: This exam was performed according to the departmental dose-optimization program which includes automated exposure control, adjustment of the mA and/or kV according to patient size and/or use of iterative reconstruction technique. CONTRAST:  36m OMNIPAQUE IOHEXOL 350 MG/ML SOLN COMPARISON:  CT Abdomen and Pelvis today reported separately. CTA chest 01/01/2022. FINDINGS: Cardiovascular: Excellent contrast bolus timing in the pulmonary arterial tree. Segmental right upper lobe pulmonary artery thrombus is confluent and tracks distally in the regional vessels series 5, image 153. No lobar or saddle embolus. No other significant pulmonary artery filling defect. Chronic CABG. Stable mild cardiomegaly. No pericardial effusion. Calcified aortic atherosclerosis. No contrast in the aorta. Mediastinum/Nodes: Multiple maximal mediastinal lymph nodes up to 10 mm short axis do appear mildly increased since November and are indeterminate. Lungs/Pleura: Major airways remain patent. No right upper lobe pulmonary infarct. No significant pleural effusion. Bilateral costophrenic angle sub solid and dependent opacity is probably increased atelectasis. Infection is felt less likely due to the degree of symmetry. No suspicious pulmonary nodule identified at this time. Upper Abdomen: CT  Abdomen and Pelvis reported separately. Musculoskeletal: Chronic sternotomy. Stable visualized osseous structures. No acute or destructive osseous lesion identified. Review of the MIP images confirms the above findings. IMPRESSION: 1. Positive for Segmental right upper lobe Pulmonary Emboli. No lobar or saddle embolus. No right upper lobe pulmonary infarct or pleural effusion at this time. 2. Borderline to mildly enlarged mediastinal lymph nodes are indeterminate. But no other metastatic disease identified in the chest. 3. CT Abdomen and Pelvis today reported separately. Electronically Signed: By: HGenevie AnnM.D. On: 04/18/2022 04:22  CT ABDOMEN PELVIS W CONTRAST  Result Date: 04/18/2022 CLINICAL DATA:  89 year old male with stage III hepatocellular carcinoma. Shortness of breath and hypoxia. Abdominal pain, nausea vomiting. Right upper lobe pulmonary emboli on CTA chest today. EXAM: CT ABDOMEN AND PELVIS WITH CONTRAST TECHNIQUE: Multidetector CT imaging of the abdomen and pelvis was performed using the standard protocol following bolus administration of intravenous contrast. RADIATION DOSE REDUCTION: This exam was performed according to the departmental dose-optimization program which includes automated exposure control, adjustment of the mA and/or kV according to patient size and/or use of iterative reconstruction technique. CONTRAST:  58m OMNIPAQUE IOHEXOL 350 MG/ML SOLN COMPARISON:  CTA chest today reported separately. Noncontrast CT Abdomen and Pelvis 01/27/2022. Abdomen MRI 02/11/2022. FINDINGS: Lower chest: Stable to that reported separately today. Hepatobiliary: Heterogeneous mass of the left hepatic lobe in the midline, subxiphoid location appears increased in heterogeneity, with central cystic or necrotic change, since December. This distorts the liver capsule. Overall size and configuration probably not significantly changed. Underlying liver nodularity. Evidence of a new or increased 16 mm hypodense  lesion in the inferior right hepatic lobe since the January MRI. New perihepatic ascites, small volume. Absent gallbladder. Stable extrahepatic bile ducts. Pancreas: Stable, negative. Spleen: Small volume new perisplenic ascites, otherwise stable. Adrenals/Urinary Tract: Suspicious left adrenal nodule is 1.7 cm now, increased by 3 mm since December. Contralateral right adrenal gland remains normal. Nonobstructed kidneys. Large exophytic but simple fluid density and benign appearing renal cysts (no follow-up imaging recommended). Symmetric renal contrast excretion on the delayed images. No hydroureter. Diminutive bladder. Incidental pelvic phleboliths. Stomach/Bowel: Small volume but largely new abdominal ascites since December with simple fluid density. Chronic diverticulosis of the descending and sigmoid colon. No definite active inflammation. Lesser diverticula of the transverse colon. No convincing acute large bowel inflammation. Appendix appears to remain normal on series 5, image 72. No dilated small bowel. No pneumoperitoneum. Proximal duodenum diverticulum is about 2 cm on series 5, image 44. No associated inflammation. Duodenum and stomach otherwise mostly decompressed. Edema at the root of the small bowel mesentery is similar to December. There is regional mesenteric lymphadenopathy which does not appear significantly changed. Vascular/Lymphatic: Diffuse Aortoiliac calcified atherosclerosis. Major arterial structures in the abdomen and pelvis remain patent. The main portal vein, splenic vein, SMV, and tributaries appear to remain patent. Intrahepatic portal vein thrombosis is not excluded. Moderate lymphadenopathy in the upper abdomen centered at the gastrohepatic ligament and celiac axis is probably metastatic disease and appears increased since December. Reproductive: Prostatomegaly. Other: Pelvic ascites with simple fluid density is increased. Small to moderate volume. Musculoskeletal: Subtle L2 inferior  endplate compression compared to December. No obvious lumbar metastases on the January MRI. And elsewhere spinal bone mineralization appears stable since December. No other acute or suspicious osseous lesion. IMPRESSION: 1. Metastatic Hepatocellular Carcinoma, mildly progressive CT appearance since December. Small volume new abdominal and pelvic ascites since that time. Dominant left hepatic lobe mass. Questionable new or increased 16 mm right hepatic lobe lesion since the January MRI (series 5, image 44). Gastrohepatic ligament and other upper abdominal lymphadenopathy. 2. Subtle L2 inferior endplate compression fracture appears new since December, but is probably a benign osteoporotic fracture in light of no obvious lumbar metastases by MRI in January. Query new back pain. 3. See abnormal Chest CTA today reported separately. Aortic Atherosclerosis (ICD10-I70.0). Electronically Signed   By: HGenevie AnnM.D.   On: 04/18/2022 04:35      Assessment/Plan Principal Problem:   Acute pulmonary embolism (HCC) Active  Problems:   CAD, AUTOLOGOUS BYPASS GRAFT   Myocardial injury   Hepatocellular carcinoma (HCC)   Abnormal LFTs   CLL (chronic lymphocytic leukemia) (HCC)   Hypertension   History of CVA (cerebrovascular accident)   Thrombocytopenia ([place])   Hyperlipidemia   Type II diabetes mellitus with renal manifestations (HCC)   Chronic kidney disease, stage 3a (HCC)   Hypothermia   Prolonged QT interval   Depression with anxiety   Protein-calorie malnutrition, moderate (HCC)   Assessment and Plan:  Acute pulmonary embolism ([place]): CTA showed segmental right upper lobe Pulmonary Emboli. No lobar or saddle embolus. No right upper lobe pulmonary infarct or pleural effusion at this time.  Patient is hemodynamically stable.  Patient has 2 L new oxygen requirement.  -admit to PCU as pt -heparin drip initiated -2D echocardiogram ordered -pain control: When necessary Oxycodone  -prn albuterol nebs and  mucinex   Hx of CAD, AUTOLOGOUS BYPASS GRAFT and myocardial injury: trop  27, which is most likely due to PE. -Aspirin, Lipitor -Patient is not taking Plavix currently, will not restart the Plavix since patient will be started on anticoagulants for PE  Hepatocellular carcinoma [facility]): Recently diagnosed with hepatocellular carcinoma Stage IIIa. Patient was referred to [facility], planning to start chemotherapy, but not yet. -f/u in [facility]   Abnormal LFTs: Secondary to [facility] -Avoid using Tylenol  CLL (chronic lymphocytic leukemia) ([place]): Recently diagnosed. WBC 17.7 today. Per Dr. Elroy Channel clinic note on 2/27, this can be treated conservatively. -Follow-up with oncology  Hypertension: -IV hydralazine as needed -Amlodipine, lisinopril, metoprolol  History of CVA (cerebrovascular accident): Patient is not taking Plavix since he had gallbladder surgery on 12/2021 -Aspirin, Lipitor -Will not restart Plavix  Thrombocytopenia ([place]): This is chronic issue.  Platelets are 139 today -Follow-up with CBC  Hyperlipidemia -Lipitor  Type II diabetes mellitus with renal manifestations [facility]): Recent A1c 7.3.  Patient states that he is taking Levemir 25 units daily at bedtime -Sliding scale insulin -Levemir 16 units at bedtime daily  Chronic kidney disease, stage 3a ([place]): Stable.  -Follow-up with BMP  Hypothermia: Body temperature 97.5 -Bair hugger  QTc prolongation: -Hold Prozac and trazodone  Depression with anxiety -Hold Prozac, trazodone due to QTc prolonged patient  Protein-calorie malnutrition, moderate ([place]): Body weight 61.2 kg, BMI 18.31 -Ensure -Nutrition consult     DVT ppx: on IV Heparin    Code Status: Full code per pt  Family Communication:   Yes, patient's son    at bed side.  Disposition Plan:  Anticipate discharge back to previous environment  Consults called:  none  Admission status and Level of care: Progressive:   as inpt       Dispo: The patient is from: Home               Anticipated d/c is to: Home              Anticipated d/c date is: 2 days              Patient currently is not medically stable to d/c.    Severity of Illness:  The appropriate patient status for this patient is INPATIENT. Inpatient status is judged to be reasonable and necessary in order to provide the required intensity of service to ensure the patient's safety. The patient's presenting symptoms, physical exam findings, and initial radiographic and laboratory data in the context of their chronic comorbidities is felt to place them at high risk for further clinical deterioration. Furthermore, it is not  anticipated that the patient will be medically stable for discharge from the hospital within 2 midnights of admission.   * I certify that at the point of admission it is my clinical judgment that the patient will require inpatient hospital care spanning beyond 2 midnights from the point of admission due to high intensity of service, high risk for further   deterioration and high frequency of surveillance required.*       Date of Service 04/18/2022    Ivor Costa [facility]   If 7PM-7AM, please contact night-coverage [email or web address] 04/18/2022, 9:45 AM

## 2022-04-18 NOTE — Progress Notes (Signed)
[place] for heparin infusion Indication: pulmonary embolus  Allergies  Allergen Reactions   Ibuprofen Swelling   Penicillins Swelling and Hives    lips Other reaction(s): Other (See Comments) Edema (lips)    Patient Measurements: Height: 6' (182.9 cm) Weight: 61.2 kg (135 lb) IBW/kg (Calculated) : 77.6 Heparin Dosing Weight: 61.2 kg  Vital Signs: Temp: 97.5 F (36.4 C) (03/08 0243) Temp Source: Oral (03/08 0243) BP: 133/74 (03/08 0300) Pulse Rate: 88 (03/08 0300)  Labs: Recent Labs    04/18/22 0254 04/18/22 0337  HGB 11.3*  --   HCT 36.3*  --   PLT 139*  --   LABPROT  --  15.0  INR  --  1.2  CREATININE 1.21  --   TROPONINIHS 27*  --     Estimated Creatinine Clearance: 33.7 mL/min (by C-G formula based on SCr of 1.21 mg/dL).   Medical History: Past Medical History:  Diagnosis Date   Actinic keratosis    Arrhythmia    A-Fib   BPH (benign prostatic hyperplasia)    Coronary artery disease 02/11/2007   CABG X 3  @ [facility]   CVA (cerebral infarction) 02/11/2012   Diabetes mellitus without complication (HCC)    Enlarged prostate    GERD (gastroesophageal reflux disease)    Hyperlipidemia    Hypertension    Kidney stone    Renal disorder    Skin cancer    scalp, tx by Dr Sharlett Iles 2000s?    Assessment: Pt is a 89 yo male presenting to ED c/o abdominal pain with n/v, reporting receiving tx for liver cancer, found "positive for segmental right upper lobe pulmonary emboli."  Goal of Therapy:  Heparin level 0.3-0.7 units/ml Monitor platelets by anticoagulation protocol: Yes   Plan:  Bolus 4000 units x 1 Start heparin infusion at 1000 units/hr Will check HL in 8 hr after start of infusion CBC daily while on heparin  Renda Rolls, PharmD, [facility] 04/18/2022 4:37 AM

## 2022-04-19 DIAGNOSIS — R9431 Abnormal electrocardiogram [ECG] [EKG]: Secondary | ICD-10-CM

## 2022-04-19 DIAGNOSIS — C220 Liver cell carcinoma: Secondary | ICD-10-CM | POA: Diagnosis not present

## 2022-04-19 DIAGNOSIS — Z66 Do not resuscitate: Secondary | ICD-10-CM | POA: Insufficient documentation

## 2022-04-19 DIAGNOSIS — R7989 Other specified abnormal findings of blood chemistry: Secondary | ICD-10-CM

## 2022-04-19 DIAGNOSIS — I2609 Other pulmonary embolism with acute cor pulmonale: Secondary | ICD-10-CM

## 2022-04-19 DIAGNOSIS — I25810 Atherosclerosis of coronary artery bypass graft(s) without angina pectoris: Secondary | ICD-10-CM | POA: Diagnosis not present

## 2022-04-19 DIAGNOSIS — Z7189 Other specified counseling: Secondary | ICD-10-CM

## 2022-04-19 DIAGNOSIS — F418 Other specified anxiety disorders: Secondary | ICD-10-CM | POA: Diagnosis not present

## 2022-04-19 DIAGNOSIS — E1122 Type 2 diabetes mellitus with diabetic chronic kidney disease: Secondary | ICD-10-CM

## 2022-04-19 DIAGNOSIS — N1831 Chronic kidney disease, stage 3a: Secondary | ICD-10-CM

## 2022-04-19 LAB — BASIC METABOLIC PANEL
Anion gap: 8 (ref 5–15)
BUN: 20 mg/dL (ref 8–23)
CO2: 26 mmol/L (ref 22–32)
Calcium: 9.1 mg/dL (ref 8.9–10.3)
Chloride: 98 mmol/L (ref 98–111)
Creatinine, Ser: 1.07 mg/dL (ref 0.61–1.24)
GFR, Estimated: >60 mL/min (ref >60)
Glucose, Bld: 119 mg/dL — ABNORMAL HIGH (ref 70–99)
Potassium: 4.4 mmol/L (ref 3.5–5.1)
Sodium: 132 mmol/L — ABNORMAL LOW (ref 135–145)

## 2022-04-19 LAB — HEPATIC FUNCTION PANEL
ALT: 46 U/L — ABNORMAL HIGH (ref 0–44)
AST: 57 U/L — ABNORMAL HIGH (ref 15–41)
Albumin: 3.1 g/dL — ABNORMAL LOW (ref 3.5–5.0)
Alkaline Phosphatase: 296 U/L — ABNORMAL HIGH (ref 38–126)
Bilirubin, Direct: 0.4 mg/dL — ABNORMAL HIGH (ref 0.0–0.2)
Indirect Bilirubin: 0.6 mg/dL (ref 0.3–0.9)
Total Bilirubin: 1 mg/dL (ref 0.3–1.2)
Total Protein: 7.6 g/dL (ref 6.5–8.1)

## 2022-04-19 LAB — GLUCOSE, CAPILLARY: Glucose-Capillary: 121 mg/dL — ABNORMAL HIGH (ref 70–99)

## 2022-04-19 LAB — URINALYSIS, ROUTINE W REFLEX MICROSCOPIC
Bilirubin Urine: NEGATIVE
Glucose, UA: NEGATIVE mg/dL
Hgb urine dipstick: NEGATIVE
Ketones, ur: NEGATIVE mg/dL
Leukocytes,Ua: NEGATIVE
Nitrite: NEGATIVE
Protein, ur: NEGATIVE mg/dL
Specific Gravity, Urine: 1.011 (ref 1.005–1.030)
pH: 5 (ref 5.0–8.0)

## 2022-04-19 LAB — CBC
HCT: 31.9 % — ABNORMAL LOW (ref 39.0–52.0)
Hemoglobin: 10.1 g/dL — ABNORMAL LOW (ref 13.0–17.0)
MCH: 26.2 pg (ref 26.0–34.0)
MCHC: 31.7 g/dL (ref 30.0–36.0)
MCV: 82.6 fL (ref 80.0–100.0)
Platelets: 133 10*3/uL — ABNORMAL LOW (ref 150–400)
RBC: 3.86 MIL/uL — ABNORMAL LOW (ref 4.22–5.81)
RDW: 22.4 % — ABNORMAL HIGH (ref 11.5–15.5)
WBC: 16 10*3/uL — ABNORMAL HIGH (ref 4.0–10.5)
nRBC: 0 % (ref 0.0–0.2)

## 2022-04-19 LAB — HEMOGLOBIN A1C
Hgb A1c MFr Bld: 7.6 % — ABNORMAL HIGH (ref 4.8–5.6)
Mean Plasma Glucose: 171 mg/dL

## 2022-04-19 LAB — HEPARIN LEVEL (UNFRACTIONATED): Heparin Unfractionated: 0.29 IU/mL — ABNORMAL LOW (ref 0.30–0.70)

## 2022-04-19 LAB — MAGNESIUM: Magnesium: 1.7 mg/dL (ref 1.7–2.4)

## 2022-04-19 MED ORDER — APIXABAN 5 MG PO TABS
10.0000 mg | ORAL_TABLET | Freq: Two times a day (BID) | ORAL | Status: DC
  Administered 2022-04-19: 10 mg via ORAL
  Filled 2022-04-19: qty 2

## 2022-04-19 MED ORDER — ADULT MULTIVITAMIN W/MINERALS CH: 1.0000 | ORAL_TABLET | Freq: Every day | ORAL | 1 refills | Status: DC

## 2022-04-19 MED ORDER — APIXABAN 5 MG PO TABS: 5.0000 mg | ORAL_TABLET | Freq: Two times a day (BID) | ORAL | 2 refills | Status: DC

## 2022-04-19 MED ORDER — HEPARIN BOLUS VIA INFUSION
1800.0000 [IU] | Freq: Once | INTRAVENOUS | Status: AC
  Administered 2022-04-19: 1800 [IU] via INTRAVENOUS
  Filled 2022-04-19: qty 1800

## 2022-04-19 MED ORDER — APIXABAN (ELIQUIS) VTE STARTER PACK (10MG AND 5MG): ORAL_TABLET | ORAL | 0 refills | Status: DC

## 2022-04-19 MED ORDER — APIXABAN 5 MG PO TABS: 5.0000 mg | ORAL_TABLET | Freq: Two times a day (BID) | ORAL | Status: DC

## 2022-04-19 NOTE — Care Management Obs Status (Signed)
[facility] NOTIFICATION   Patient Details  Name: SADDIQ EHRHART MRN: [id number] Date of Birth: Feb 24, 1927   Medicare Observation Status Notification Given:  Yes    Tiburcio Bash, LCSW 04/19/2022, 10:42 AM

## 2022-04-19 NOTE — Care Management CC44 (Signed)
Condition Code 44 Documentation Completed  Patient Details  Name: Randy Frazier MRN: [id number] Date of Birth: 05-30-1927   Condition Code 44 given:  Yes Patient signature on Condition Code 44 notice:  Yes Documentation of 2 MD's agreement:  Yes Code 44 added to claim:  Yes    Tiburcio Bash, LCSW 04/19/2022, 10:42 AM

## 2022-04-19 NOTE — Progress Notes (Signed)
[place] for heparin infusion Indication: pulmonary embolus  Allergies  Allergen Reactions   Ibuprofen Swelling   Penicillins Swelling and Hives    lips Other reaction(s): Other (See Comments) Edema (lips)    Patient Measurements: Height: 6' (182.9 cm) Weight: 75.2 kg (165 lb 11.2 oz) IBW/kg (Calculated) : 77.6 Heparin Dosing Weight: 61.2 kg  Vital Signs: Temp: 97.4 F (36.3 C) (03/08 2000) Temp Source: Oral (03/08 2000) BP: 160/76 (03/08 2000) Pulse Rate: 78 (03/08 2000)  Labs: Recent Labs    04/18/22 0254 04/18/22 0337 04/18/22 1429 04/18/22 2327  HGB 11.3*  --   --   --   HCT 36.3*  --   --   --   PLT 139*  --   --   --   APTT  --   --  40*  --   LABPROT  --  15.0  --   --   INR  --  1.2  --   --   HEPARINUNFRC  --   --  0.10* 0.18*  CREATININE 1.21  --   --   --   TROPONINIHS 27*  --   --   --      Estimated Creatinine Clearance: 41.4 mL/min (by C-G formula based on SCr of 1.21 mg/dL).   Medical History: Past Medical History:  Diagnosis Date   Actinic keratosis    Arrhythmia    A-Fib   BPH (benign prostatic hyperplasia)    Coronary artery disease 02/11/2007   CABG X 3  @ [facility]   CVA (cerebral infarction) 02/11/2012   Diabetes mellitus without complication (HCC)    Enlarged prostate    GERD (gastroesophageal reflux disease)    Hyperlipidemia    Hypertension    Kidney stone    Renal disorder    Skin cancer    scalp, tx by Dr Sharlett Iles 2000s?    Assessment: Pt is a 89 yo male presenting to ED c/o abdominal pain with n/v, reporting receiving tx for liver cancer, found "positive for segmental right upper lobe pulmonary emboli."  Goal of Therapy:  Heparin level 0.3-0.7 units/ml Monitor platelets by anticoagulation protocol: Yes   03/08 1429 HL 0.10, subtherapeutic 03/08  2327 HL 0.18, subtherapeutic  Plan: HL subtherapeutic Give heparin 1800 units IV x 1 Increase heparin infusion to 1400  units/hr Will check HL in 8 hr after rate change CBC daily while on heparin  Renda Rolls, PharmD, [facility] 04/19/2022 12:08 AM

## 2022-04-19 NOTE — Progress Notes (Signed)
[place] for transition from heparin infusion to apixaban Indication: pulmonary embolus  Allergies  Allergen Reactions   Ibuprofen Swelling   Penicillins Swelling and Hives    lips Other reaction(s): Other (See Comments) Edema (lips)    Patient Measurements: Height: 6' (182.9 cm) Weight: 75.2 kg (165 lb 11.2 oz) IBW/kg (Calculated) : 77.6 Heparin Dosing Weight: 61.2 kg  Vital Signs: Temp: 98.1 F (36.7 C) (03/09 0700) Temp Source: Oral (03/09 0448) BP: 146/70 (03/09 0700) Pulse Rate: 74 (03/09 0700)  Labs: Recent Labs    04/18/22 0254 04/18/22 0337 04/18/22 1429 04/18/22 2327 04/19/22 0629 04/19/22 0827  HGB 11.3*  --   --   --  10.1*  --   HCT 36.3*  --   --   --  31.9*  --   PLT 139*  --   --   --  133*  --   APTT  --   --  40*  --   --   --   LABPROT  --  15.0  --   --   --   --   INR  --  1.2  --   --   --   --   HEPARINUNFRC  --   --  0.10* 0.18*  --  0.29*  CREATININE 1.21  --   --   --  1.07  --   TROPONINIHS 27*  --   --   --   --   --      Estimated Creatinine Clearance: 46.9 mL/min (by C-G formula based on SCr of 1.07 mg/dL).   Medical History: Past Medical History:  Diagnosis Date   Actinic keratosis    Arrhythmia    A-Fib   BPH (benign prostatic hyperplasia)    Coronary artery disease 02/11/2007   CABG X 3  @ [facility]   CVA (cerebral infarction) 02/11/2012   Diabetes mellitus without complication (HCC)    Enlarged prostate    GERD (gastroesophageal reflux disease)    Hyperlipidemia    Hypertension    Kidney stone    Renal disorder    Skin cancer    scalp, tx by Dr Sharlett Iles 2000s?    Assessment: Pt is a 89 yo male presenting to ED c/o abdominal pain with n/v, reporting receiving tx for liver cancer, found "positive for segmental right upper lobe pulmonary emboli."  Goal of Therapy:  Monitor platelets by anticoagulation protocol: Yes   Plan:  ---stop heparin infusion ---start apixaban 10 mg po  BID for 7 days, then 5 mg po BID thereafter ---CBC and SCr at least once weekly  Vallery Sa, PharmD, BCPS 04/19/2022 9:17 AM

## 2022-04-19 NOTE — Discharge Summary (Signed)
Physician Discharge Summary  Randy Frazier [id number] DOB: 1927-11-21 DOA: 04/18/2022  PCP: Merryl Hacker, No  Admit date: 04/18/2022 Discharge date: 04/19/2022 Admitted From: Home Disposition: Home Recommendations for Outpatient Follow-up:  Follow up with PCP in 1 week Follow-up with oncology as previously planned Check CMP and CBC in 1 week Please follow up on the following pending results: None  Home Health: Not indicated Equipment/Devices: Not indicated  Discharge Condition: Stable CODE STATUS: DNR/DNI (admitted as full code)   Hospital course 89 year old M with PMH of CAD/remote CABG on DAPT, DM-2, CVA, HTN, HLD, GERD, anxiety, depression, CKD-3A, BPH, thrombocytopenia and recent diagnosis of HCC and CLL presenting with nausea, vomiting and RUQ abdominal pain, and found to have segmental RUL PE on CTA chest.  Labs significant for mild leukocytosis, thrombocytopenia and mildly elevated LFT.  CT also showed mildly enlarged mediastinal lymph nodes, mild progression of metastatic HCC compared to 12/23, small volume ascites and dominant left hepatic lobe mass and gastrohepatic ligament and other upper abdominal LAD.  Patient was started on IV heparin.  The next day, patient remained stable on IV heparin.  GI symptoms resolved.  No respiratory distress.  Transition to starter pack Eliquis and discharged.  Given risk of bleeding, discontinue Plavix on discharge.  Recommend repeat CBC and CMP in 1 week.  Had goals of care discussion with focus on CODE STATUS.  Patient does not want CPR or intubation in an event of cardiopulmonary arrest.  See individual problem list below for more.   Problems addressed during this hospitalization Principal Problem:   Acute pulmonary embolism ([name]) Active Problems:   CAD, AUTOLOGOUS BYPASS GRAFT   Myocardial injury   Hepatocellular carcinoma (HCC)   Abnormal LFTs   CLL (chronic lymphocytic leukemia) (HCC)   Hypertension   History of CVA  (cerebrovascular accident)   Thrombocytopenia ([name])   Hyperlipidemia   Type II diabetes mellitus with renal manifestations (HCC)   Chronic kidney disease, stage 3a (HCC)   Prolonged QT interval   Depression with anxiety   Goals of care, counseling/discussion   DNR (do not resuscitate)   Acute pulmonary embolism without cor pulmonale.  CT angio with segmental RUL PE.  No respiratory distress.  Hemodynamically stable.  Transition to starter pack Eliquis.   History of CAD with remote CABG: Stable.   -Discontinued Plavix after starting Eliquis due to risk of bleeding.  Continue low-dose aspirin and other medications.    Hepatocellular carcinoma/CLL: Recent diagnosis.  Followed at [facility]  CT angio with some progression. -Outpatient follow-up.   Abnormal LFTs: Likely due to [facility].  Improved. -Recheck CMP in 1 week   CLL: Slightly elevated WBC. -Outpatient follow-up with oncology   Hypertension: -Continue home meds   History of CVA: No focal neurodeficit. -Discontinued Plavix.   Thrombocytopenia: Stable. -Replace within 1 week   Hyperlipidemia -Lipitor.  Takes low-dose.   Uncontrolled IDDM-2 -Continue home meds.   CKD-3A: Stable. -Recheck CMP in 1 week   QTc prolongation: -Minimize QT prolonging drugs.   Depression with anxiety: Stable. -Continue home meds  Goal of care counseling/DNR/DNI   Increased nutrient needs Nutrition Problem: Increased nutrient needs Etiology: cancer and cancer related treatments Signs/Symptoms: estimated needs Interventions: Ensure Enlive (each supplement provides 350kcal and 20 grams of protein), Magic cup, MVI, Liberalize Diet     Vital signs Vitals:   04/18/22 2000 04/19/22 0014 04/19/22 0448 04/19/22 0700  BP: (!) 160/76 116/63 139/66 (!) 146/70  Pulse: 78 70 79 74  Temp: (!) 97.4  F (36.3 C) 98.3 F (36.8 C) 98.4 F (36.9 C) 98.1 F (36.7 C)  Resp: '19 17 18   '$ Height:      Weight:      SpO2: 95% 97% 98% 99%  TempSrc: Oral  Oral Oral   BMI (Calculated):         Discharge exam  GENERAL: No apparent distress.  Nontoxic. HEENT: MMM.  Vision and hearing grossly intact.  NECK: Supple.  No apparent JVD.  RESP:  No IWOB.  Fair aeration bilaterally. CVS:  RRR. Heart sounds normal.  ABD/GI/GU: BS+. Abd soft, NTND.  MSK/EXT:  Moves extremities. No apparent deformity. No edema.  SKIN: no apparent skin lesion or wound NEURO: Awake and alert. Oriented appropriately.  No apparent focal neuro deficit. PSYCH: Calm. Normal affect.   Discharge Instructions Discharge Instructions     Diet - low sodium heart healthy   Complete by: As directed    Discharge instructions   Complete by: As directed    It has been a pleasure taking care of you!  You were hospitalized due to nausea, vomiting and right-sided abdominal pain found to have blood in your right lung.  You have been started on blood thinner.  It is very important that you take this medication as prescribed.  There is also concerned that your liver cancer has slightly increased which could attribute to your right-sided abdominal pain.  Follow-up with your primary care doctor 1 to 2 weeks or sooner if needed.  Follow-up with your oncologist as previously planned.   Take care,   Increase activity slowly   Complete by: As directed       Allergies as of 04/19/2022       Reactions   Ibuprofen Swelling   Penicillins Swelling, Hives   lips Other reaction(s): Other (See Comments) Edema (lips)        Medication List     STOP taking these medications    clopidogrel 75 MG tablet Commonly known as: PLAVIX       TAKE these medications    amLODipine 10 MG tablet Commonly known as: NORVASC TAKE 1 TABLET BY MOUTH EVERY DAY   Apixaban Starter Pack ('10mg'$  and '5mg'$ ) Commonly known as: ELIQUIS STARTER PACK Take as directed on package: start with two-'5mg'$  tablets twice daily for 7 days. On day 8, switch to one-'5mg'$  tablet twice daily.   apixaban 5 MG Tabs  tablet Commonly known as: ELIQUIS Take 1 tablet (5 mg total) by mouth 2 (two) times daily. Start taking on: May 19, 2022   aspirin EC 81 MG tablet Take 81 mg by mouth.   atorvastatin 10 MG tablet Commonly known as: LIPITOR Take 1 tablet (10 mg total) by mouth every other day.   ezetimibe 10 MG tablet Commonly known as: ZETIA Take 10 mg by mouth daily.   FLUoxetine 20 MG capsule Commonly known as: PROZAC Take 20 mg by mouth daily.   gabapentin 300 MG capsule Commonly known as: NEURONTIN Take 300 mg by mouth 3 (three) times daily.   HumaLOG 100 UNIT/ML injection Generic drug: insulin lispro Inject 20 Units into the skin 2 (two) times daily.   hydrOXYzine 10 MG tablet Commonly known as: ATARAX Take 1 tablet (10 mg total) by mouth daily as needed for itching.   insulin detemir 100 UNIT/ML injection Commonly known as: LEVEMIR Inject 0.16 mLs (16 Units total) into the skin daily. What changed: how much to take   lidocaine-prilocaine cream Commonly known as: EMLA Apply  to affected area once   lisinopril 20 MG tablet Commonly known as: ZESTRIL Take 20 mg by mouth daily.   Magnesium 500 MG Tabs Take 500 mg by mouth daily.   metFORMIN 500 MG 24 hr tablet Commonly known as: GLUCOPHAGE-XR Take 500 mg by mouth 2 (two) times daily with a meal.   metoprolol tartrate 25 MG tablet Commonly known as: LOPRESSOR Take 25 mg by mouth 2 (two) times daily.   multivitamin with minerals Tabs tablet Take 1 tablet by mouth daily. Start taking on: April 20, 2022   nystatin 100000 UNIT/ML suspension Commonly known as: MYCOSTATIN Take 5 mLs by mouth 4 (four) times daily.   ondansetron 8 MG tablet Commonly known as: Zofran Take 1 tablet (8 mg total) by mouth every 8 (eight) hours as needed for nausea or vomiting.   oxyCODONE 5 MG immediate release tablet Commonly known as: Oxy IR/ROXICODONE Take 1 tablet (5 mg total) by mouth every 4 (four) hours as needed for moderate pain.    pantoprazole 40 MG tablet Commonly known as: PROTONIX Take 1 tablet (40 mg total) by mouth daily.   prochlorperazine 10 MG tablet Commonly known as: COMPAZINE Take 1 tablet (10 mg total) by mouth every 6 (six) hours as needed for nausea or vomiting.   traZODone 50 MG tablet Commonly known as: DESYREL Take 1 tablet (50 mg total) by mouth at bedtime.        Consultations: None  Procedures/Studies:   US Venous Img Lower Bilateral (DVT)  Result Date: 04/18/2022 CLINICAL DATA:  89 year old male with stage III hepatocellular carcinoma. Shortness of breath and right upper lobe pulmonary embolus on CTA this morning. EXAM: BILATERAL LOWER EXTREMITY VENOUS DOPPLER ULTRASOUND TECHNIQUE: Gray-scale sonography with compression, as well as color and duplex ultrasound, were performed to evaluate the deep venous system(s) from the level of the common femoral vein through the popliteal and proximal calf veins. COMPARISON:  CTA Chest, CT Abdomen and Pelvis 0404 hours today. FINDINGS: VENOUS Normal compressibility of the bilateral common femoral, superficial femoral, and popliteal veins, as well as the visualized calf veins. Visualized portions of the bilateral profunda femoral vein and great saphenous vein unremarkable. No filling defects to suggest DVT on grayscale or color Doppler imaging. Doppler waveforms show normal direction of venous flow, normal respiratory plasticity and response to augmentation. OTHER None. Limitations: none IMPRESSION: No evidence of bilateral lower extremity deep venous thrombosis. Electronically Signed   By: Genevie Ann M.D.   On: 04/18/2022 06:04   CT Angio Chest PE W and/or Wo Contrast  Addendum Date: 04/18/2022   ADDENDUM REPORT: 04/18/2022 04:35 ADDENDUM: Study discussed by telephone with Dr. Vladimir Crofts on 04/18/2022 at 0423 hours. Electronically Signed   By: Genevie Ann M.D.   On: 04/18/2022 04:35   Result Date: 04/18/2022 CLINICAL DATA:  89 year old male with stage III  hepatocellular carcinoma. Shortness of breath and hypoxia. Abdominal pain, nausea vomiting. EXAM: CT ANGIOGRAPHY CHEST WITH CONTRAST TECHNIQUE: Multidetector CT imaging of the chest was performed using the standard protocol during bolus administration of intravenous contrast. Multiplanar CT image reconstructions and MIPs were obtained to evaluate the vascular anatomy. RADIATION DOSE REDUCTION: This exam was performed according to the departmental dose-optimization program which includes automated exposure control, adjustment of the mA and/or kV according to patient size and/or use of iterative reconstruction technique. CONTRAST:  19m OMNIPAQUE IOHEXOL 350 MG/ML SOLN COMPARISON:  CT Abdomen and Pelvis today reported separately. CTA chest 01/01/2022. FINDINGS: Cardiovascular: Excellent contrast bolus timing in  the pulmonary arterial tree. Segmental right upper lobe pulmonary artery thrombus is confluent and tracks distally in the regional vessels series 5, image 153. No lobar or saddle embolus. No other significant pulmonary artery filling defect. Chronic CABG. Stable mild cardiomegaly. No pericardial effusion. Calcified aortic atherosclerosis. No contrast in the aorta. Mediastinum/Nodes: Multiple maximal mediastinal lymph nodes up to 10 mm short axis do appear mildly increased since November and are indeterminate. Lungs/Pleura: Major airways remain patent. No right upper lobe pulmonary infarct. No significant pleural effusion. Bilateral costophrenic angle sub solid and dependent opacity is probably increased atelectasis. Infection is felt less likely due to the degree of symmetry. No suspicious pulmonary nodule identified at this time. Upper Abdomen: CT Abdomen and Pelvis reported separately. Musculoskeletal: Chronic sternotomy. Stable visualized osseous structures. No acute or destructive osseous lesion identified. Review of the MIP images confirms the above findings. IMPRESSION: 1. Positive for Segmental right  upper lobe Pulmonary Emboli. No lobar or saddle embolus. No right upper lobe pulmonary infarct or pleural effusion at this time. 2. Borderline to mildly enlarged mediastinal lymph nodes are indeterminate. But no other metastatic disease identified in the chest. 3. CT Abdomen and Pelvis today reported separately. Electronically Signed: By: Genevie Ann M.D. On: 04/18/2022 04:22   CT ABDOMEN PELVIS W CONTRAST  Result Date: 04/18/2022 CLINICAL DATA:  89 year old male with stage III hepatocellular carcinoma. Shortness of breath and hypoxia. Abdominal pain, nausea vomiting. Right upper lobe pulmonary emboli on CTA chest today. EXAM: CT ABDOMEN AND PELVIS WITH CONTRAST TECHNIQUE: Multidetector CT imaging of the abdomen and pelvis was performed using the standard protocol following bolus administration of intravenous contrast. RADIATION DOSE REDUCTION: This exam was performed according to the departmental dose-optimization program which includes automated exposure control, adjustment of the mA and/or kV according to patient size and/or use of iterative reconstruction technique. CONTRAST:  63m OMNIPAQUE IOHEXOL 350 MG/ML SOLN COMPARISON:  CTA chest today reported separately. Noncontrast CT Abdomen and Pelvis 01/27/2022. Abdomen MRI 02/11/2022. FINDINGS: Lower chest: Stable to that reported separately today. Hepatobiliary: Heterogeneous mass of the left hepatic lobe in the midline, subxiphoid location appears increased in heterogeneity, with central cystic or necrotic change, since December. This distorts the liver capsule. Overall size and configuration probably not significantly changed. Underlying liver nodularity. Evidence of a new or increased 16 mm hypodense lesion in the inferior right hepatic lobe since the January MRI. New perihepatic ascites, small volume. Absent gallbladder. Stable extrahepatic bile ducts. Pancreas: Stable, negative. Spleen: Small volume new perisplenic ascites, otherwise stable. Adrenals/Urinary  Tract: Suspicious left adrenal nodule is 1.7 cm now, increased by 3 mm since December. Contralateral right adrenal gland remains normal. Nonobstructed kidneys. Large exophytic but simple fluid density and benign appearing renal cysts (no follow-up imaging recommended). Symmetric renal contrast excretion on the delayed images. No hydroureter. Diminutive bladder. Incidental pelvic phleboliths. Stomach/Bowel: Small volume but largely new abdominal ascites since December with simple fluid density. Chronic diverticulosis of the descending and sigmoid colon. No definite active inflammation. Lesser diverticula of the transverse colon. No convincing acute large bowel inflammation. Appendix appears to remain normal on series 5, image 72. No dilated small bowel. No pneumoperitoneum. Proximal duodenum diverticulum is about 2 cm on series 5, image 44. No associated inflammation. Duodenum and stomach otherwise mostly decompressed. Edema at the root of the small bowel mesentery is similar to December. There is regional mesenteric lymphadenopathy which does not appear significantly changed. Vascular/Lymphatic: Diffuse Aortoiliac calcified atherosclerosis. Major arterial structures in the abdomen and pelvis remain  patent. The main portal vein, splenic vein, SMV, and tributaries appear to remain patent. Intrahepatic portal vein thrombosis is not excluded. Moderate lymphadenopathy in the upper abdomen centered at the gastrohepatic ligament and celiac axis is probably metastatic disease and appears increased since December. Reproductive: Prostatomegaly. Other: Pelvic ascites with simple fluid density is increased. Small to moderate volume. Musculoskeletal: Subtle L2 inferior endplate compression compared to December. No obvious lumbar metastases on the January MRI. And elsewhere spinal bone mineralization appears stable since December. No other acute or suspicious osseous lesion. IMPRESSION: 1. Metastatic Hepatocellular Carcinoma,  mildly progressive CT appearance since December. Small volume new abdominal and pelvic ascites since that time. Dominant left hepatic lobe mass. Questionable new or increased 16 mm right hepatic lobe lesion since the January MRI (series 5, image 44). Gastrohepatic ligament and other upper abdominal lymphadenopathy. 2. Subtle L2 inferior endplate compression fracture appears new since December, but is probably a benign osteoporotic fracture in light of no obvious lumbar metastases by MRI in January. Query new back pain. 3. See abnormal Chest CTA today reported separately. Aortic Atherosclerosis (ICD10-I70.0). Electronically Signed   By: Genevie Ann M.D.   On: 04/18/2022 04:35       The results of significant diagnostics from this hospitalization (including imaging, microbiology, ancillary and laboratory) are listed below for reference.     Microbiology: Recent Results (from the past 240 hour(s))  Resp panel by RT-PCR (RSV, Flu A&B, Covid) Anterior Nasal Swab     Status: None   Collection Time: 04/18/22  2:58 AM   Specimen: Anterior Nasal Swab  Result Value Ref Range Status   SARS Coronavirus 2 by RT PCR NEGATIVE NEGATIVE Final    Comment: (NOTE) SARS-CoV-2 target nucleic acids are NOT DETECTED.  The SARS-CoV-2 RNA is generally detectable in upper respiratory specimens during the acute phase of infection. The lowest concentration of SARS-CoV-2 viral copies this assay can detect is 138 copies/mL. A negative result does not preclude SARS-Cov-2 infection and should not be used as the sole basis for treatment or other patient management decisions. A negative result may occur with  improper specimen collection/handling, submission of specimen other than nasopharyngeal swab, presence of viral mutation(s) within the areas targeted by this assay, and inadequate number of viral copies(<138 copies/mL). A negative result must be combined with clinical observations, patient history, and  epidemiological information. The expected result is Negative.  Fact Sheet for Patients:  EntrepreneurPulse.com.au  Fact Sheet for Healthcare Providers:  IncredibleEmployment.be  This test is no t yet approved or cleared by the [place] FDA and  has been authorized for detection and/or diagnosis of SARS-CoV-2 by FDA under an Emergency Use Authorization (EUA). This EUA will remain  in effect (meaning this test can be used) for the duration of the COVID-19 declaration under Section 564(b)(1) of the Act, 21 U.S.C.section 360bbb-3(b)(1), unless the authorization is terminated  or revoked sooner.       Influenza A by PCR NEGATIVE NEGATIVE Final   Influenza B by PCR NEGATIVE NEGATIVE Final    Comment: (NOTE) The Xpert Xpress SARS-CoV-2/FLU/RSV plus assay is intended as an aid in the diagnosis of influenza from Nasopharyngeal swab specimens and should not be used as a sole basis for treatment. Nasal washings and aspirates are unacceptable for Xpert Xpress SARS-CoV-2/FLU/RSV testing.  Fact Sheet for Patients: EntrepreneurPulse.com.au  Fact Sheet for Healthcare Providers: IncredibleEmployment.be  This test is not yet approved or cleared by the [place] FDA and has been authorized for detection and/or  diagnosis of SARS-CoV-2 by FDA under an Emergency Use Authorization (EUA). This EUA will remain in effect (meaning this test can be used) for the duration of the COVID-19 declaration under Section 564(b)(1) of the Act, 21 U.S.C. section 360bbb-3(b)(1), unless the authorization is terminated or revoked.     Resp Syncytial Virus by PCR NEGATIVE NEGATIVE Final    Comment: (NOTE) Fact Sheet for Patients: EntrepreneurPulse.com.au  Fact Sheet for Healthcare Providers: IncredibleEmployment.be  This test is not yet approved or cleared by the [place] FDA and has been  authorized for detection and/or diagnosis of SARS-CoV-2 by FDA under an Emergency Use Authorization (EUA). This EUA will remain in effect (meaning this test can be used) for the duration of the COVID-19 declaration under Section 564(b)(1) of the Act, 21 U.S.C. section 360bbb-3(b)(1), unless the authorization is terminated or revoked.  Performed at [facility], [address] [place], [place] [address]      Labs:  CBC: Recent Labs  Lab 04/18/22 0254 04/19/22 0629  WBC 17.7* 16.0*  HGB 11.3* 10.1*  HCT 36.3* 31.9*  MCV 82.9 82.6  PLT 139* 133*   BMP &GFR Recent Labs  Lab 04/18/22 0254 04/19/22 0623 04/19/22 0629  NA 131*  --  132*  K 4.4  --  4.4  CL 98  --  98  CO2 23  --  26  GLUCOSE 237*  --  119*  BUN 23  --  20  CREATININE 1.21  --  1.07  CALCIUM 8.8*  --  9.1  MG  --  1.7  --    Estimated Creatinine Clearance: 46.9 mL/min (by C-G formula based on SCr of 1.07 mg/dL). Liver & Pancreas: Recent Labs  Lab 04/18/22 0254 04/19/22 0623  AST 91* 57*  ALT 65* 46*  ALKPHOS 329* 296*  BILITOT 1.1 1.0  PROT 8.0 7.6  ALBUMIN 3.1* 3.1*   Recent Labs  Lab 04/18/22 0254  LIPASE 32   No results for input(s): "AMMONIA" in the last 168 hours. Diabetic: Recent Labs    04/18/22 0254  HGBA1C 7.6*   Recent Labs  Lab 04/18/22 0832 04/18/22 1338 04/18/22 1716 04/18/22 2056 04/19/22 0734  GLUCAP 191* 217* 207* 183* 121*   Cardiac Enzymes: No results for input(s): "CKTOTAL", "CKMB", "CKMBINDEX", "TROPONINI" in the last 168 hours. No results for input(s): "PROBNP" in the last 8760 hours. Coagulation Profile: Recent Labs  Lab 04/18/22 0337  INR 1.2   Thyroid Function Tests: No results for input(s): "TSH", "T4TOTAL", "FREET4", "T3FREE", "THYROIDAB" in the last 72 hours. Lipid Profile: No results for input(s): "CHOL", "HDL", "LDLCALC", "TRIG", "CHOLHDL", "LDLDIRECT" in the last 72 hours. Anemia Panel: No results for input(s): "VITAMINB12",  "FOLATE", "FERRITIN", "TIBC", "IRON", "RETICCTPCT" in the last 72 hours. Urine analysis:    Component Value Date/Time   COLORURINE YELLOW (A) 04/18/2022 0705   APPEARANCEUR CLEAR (A) 04/18/2022 0705   APPEARANCEUR Clear 03/30/2013 0839   LABSPEC 1.011 04/18/2022 0705   LABSPEC 1.023 03/30/2013 0839   PHURINE 5.0 04/18/2022 0705   GLUCOSEU NEGATIVE 04/18/2022 0705   GLUCOSEU 150 mg/dL 03/30/2013 0839   HGBUR NEGATIVE 04/18/2022 0705   BILIRUBINUR NEGATIVE 04/18/2022 0705   BILIRUBINUR Negative 03/30/2013 0839   [place] 04/18/2022 0705   PROTEINUR NEGATIVE 04/18/2022 0705   NITRITE NEGATIVE 04/18/2022 0705   LEUKOCYTESUR NEGATIVE 04/18/2022 0705   LEUKOCYTESUR Negative 03/30/2013 0839   Sepsis Labs: Invalid input(s): "PROCALCITONIN", "LACTICIDVEN"   SIGNED:  Mercy Riding, MD  [facility] 04/19/2022,  1:13 PM

## 2022-04-19 NOTE — Progress Notes (Signed)
  Transition of Care [facility]) Screening Note   Patient Details  Name: Randy Frazier Date of Birth: 07-14-1927   Transition of [facility]) CM/SW Contact:    Tiburcio Bash, LCSW Phone Number: 04/19/2022, 9:40 AM    Transition of Care Department [facility]) has reviewed patient and no TOC needs have been identified at this time. We will continue to monitor patient advancement through interdisciplinary progression rounds. If new patient transition needs arise, please place a TOC consult.  Kelby Fam, [place], MSW, [place]

## 2022-04-20 ENCOUNTER — Inpatient Hospital Stay
Admission: EM | Admit: 2022-04-20 | Discharge: 2022-04-23 | DRG: 175 | Disposition: A | Discharge status: Hospice | Payer: Medicare Other | Attending: Internal Medicine | Admitting: Internal Medicine

## 2022-04-20 ENCOUNTER — Encounter: Payer: Self-pay | Admitting: Emergency Medicine

## 2022-04-20 ENCOUNTER — Other Ambulatory Visit: Payer: Self-pay

## 2022-04-20 ENCOUNTER — Emergency Department: Payer: Medicare Other

## 2022-04-20 ENCOUNTER — Inpatient Hospital Stay: Payer: Medicare Other

## 2022-04-20 DIAGNOSIS — R1013 Epigastric pain: Secondary | ICD-10-CM

## 2022-04-20 DIAGNOSIS — F32A Depression, unspecified: Secondary | ICD-10-CM | POA: Diagnosis present

## 2022-04-20 DIAGNOSIS — K219 Gastro-esophageal reflux disease without esophagitis: Secondary | ICD-10-CM | POA: Diagnosis present

## 2022-04-20 DIAGNOSIS — Z1152 Encounter for screening for COVID-19: Secondary | ICD-10-CM

## 2022-04-20 DIAGNOSIS — D6859 Other primary thrombophilia: Secondary | ICD-10-CM | POA: Diagnosis present

## 2022-04-20 DIAGNOSIS — R109 Unspecified abdominal pain: Secondary | ICD-10-CM | POA: Diagnosis present

## 2022-04-20 DIAGNOSIS — J9811 Atelectasis: Secondary | ICD-10-CM | POA: Diagnosis present

## 2022-04-20 DIAGNOSIS — M109 Gout, unspecified: Secondary | ICD-10-CM | POA: Diagnosis present

## 2022-04-20 DIAGNOSIS — I10 Essential (primary) hypertension: Secondary | ICD-10-CM | POA: Diagnosis not present

## 2022-04-20 DIAGNOSIS — J9601 Acute respiratory failure with hypoxia: Secondary | ICD-10-CM | POA: Diagnosis not present

## 2022-04-20 DIAGNOSIS — E119 Type 2 diabetes mellitus without complications: Secondary | ICD-10-CM | POA: Diagnosis not present

## 2022-04-20 DIAGNOSIS — Z66 Do not resuscitate: Secondary | ICD-10-CM | POA: Diagnosis present

## 2022-04-20 DIAGNOSIS — C220 Liver cell carcinoma: Secondary | ICD-10-CM | POA: Diagnosis present

## 2022-04-20 DIAGNOSIS — M8008XA Age-related osteoporosis with current pathological fracture, vertebra(e), initial encounter for fracture: Secondary | ICD-10-CM | POA: Diagnosis present

## 2022-04-20 DIAGNOSIS — K7460 Unspecified cirrhosis of liver: Secondary | ICD-10-CM | POA: Diagnosis present

## 2022-04-20 DIAGNOSIS — E871 Hypo-osmolality and hyponatremia: Secondary | ICD-10-CM | POA: Diagnosis present

## 2022-04-20 DIAGNOSIS — Z515 Encounter for palliative care: Secondary | ICD-10-CM

## 2022-04-20 DIAGNOSIS — I129 Hypertensive chronic kidney disease with stage 1 through stage 4 chronic kidney disease, or unspecified chronic kidney disease: Secondary | ICD-10-CM | POA: Diagnosis present

## 2022-04-20 DIAGNOSIS — C9110 Chronic lymphocytic leukemia of B-cell type not having achieved remission: Secondary | ICD-10-CM | POA: Diagnosis present

## 2022-04-20 DIAGNOSIS — Z7901 Long term (current) use of anticoagulants: Secondary | ICD-10-CM | POA: Diagnosis not present

## 2022-04-20 DIAGNOSIS — D696 Thrombocytopenia, unspecified: Secondary | ICD-10-CM | POA: Diagnosis present

## 2022-04-20 DIAGNOSIS — Z681 Body mass index (BMI) 19 or less, adult: Secondary | ICD-10-CM

## 2022-04-20 DIAGNOSIS — R7989 Other specified abnormal findings of blood chemistry: Secondary | ICD-10-CM | POA: Diagnosis present

## 2022-04-20 DIAGNOSIS — E1122 Type 2 diabetes mellitus with diabetic chronic kidney disease: Secondary | ICD-10-CM | POA: Diagnosis present

## 2022-04-20 DIAGNOSIS — E1142 Type 2 diabetes mellitus with diabetic polyneuropathy: Secondary | ICD-10-CM | POA: Diagnosis present

## 2022-04-20 DIAGNOSIS — I2510 Atherosclerotic heart disease of native coronary artery without angina pectoris: Secondary | ICD-10-CM | POA: Diagnosis present

## 2022-04-20 DIAGNOSIS — Z8673 Personal history of transient ischemic attack (TIA), and cerebral infarction without residual deficits: Secondary | ICD-10-CM | POA: Diagnosis not present

## 2022-04-20 DIAGNOSIS — I4891 Unspecified atrial fibrillation: Secondary | ICD-10-CM | POA: Diagnosis present

## 2022-04-20 DIAGNOSIS — F419 Anxiety disorder, unspecified: Secondary | ICD-10-CM | POA: Diagnosis present

## 2022-04-20 DIAGNOSIS — N1831 Chronic kidney disease, stage 3a: Secondary | ICD-10-CM | POA: Diagnosis present

## 2022-04-20 DIAGNOSIS — Z794 Long term (current) use of insulin: Secondary | ICD-10-CM | POA: Diagnosis not present

## 2022-04-20 DIAGNOSIS — Z79899 Other long term (current) drug therapy: Secondary | ICD-10-CM

## 2022-04-20 DIAGNOSIS — E440 Moderate protein-calorie malnutrition: Secondary | ICD-10-CM | POA: Diagnosis present

## 2022-04-20 DIAGNOSIS — Z886 Allergy status to analgesic agent status: Secondary | ICD-10-CM

## 2022-04-20 DIAGNOSIS — I2699 Other pulmonary embolism without acute cor pulmonale: Secondary | ICD-10-CM | POA: Diagnosis present

## 2022-04-20 DIAGNOSIS — Z7902 Long term (current) use of antithrombotics/antiplatelets: Secondary | ICD-10-CM

## 2022-04-20 DIAGNOSIS — I8290 Acute embolism and thrombosis of unspecified vein: Secondary | ICD-10-CM

## 2022-04-20 DIAGNOSIS — I25810 Atherosclerosis of coronary artery bypass graft(s) without angina pectoris: Secondary | ICD-10-CM | POA: Diagnosis present

## 2022-04-20 DIAGNOSIS — E1129 Type 2 diabetes mellitus with other diabetic kidney complication: Secondary | ICD-10-CM | POA: Diagnosis present

## 2022-04-20 DIAGNOSIS — Z86711 Personal history of pulmonary embolism: Secondary | ICD-10-CM

## 2022-04-20 DIAGNOSIS — R9431 Abnormal electrocardiogram [ECG] [EKG]: Secondary | ICD-10-CM | POA: Diagnosis present

## 2022-04-20 DIAGNOSIS — Z87442 Personal history of urinary calculi: Secondary | ICD-10-CM

## 2022-04-20 DIAGNOSIS — Z951 Presence of aortocoronary bypass graft: Secondary | ICD-10-CM

## 2022-04-20 DIAGNOSIS — F418 Other specified anxiety disorders: Secondary | ICD-10-CM | POA: Diagnosis present

## 2022-04-20 DIAGNOSIS — I160 Hypertensive urgency: Secondary | ICD-10-CM | POA: Diagnosis present

## 2022-04-20 DIAGNOSIS — K859 Acute pancreatitis without necrosis or infection, unspecified: Secondary | ICD-10-CM

## 2022-04-20 DIAGNOSIS — E785 Hyperlipidemia, unspecified: Secondary | ICD-10-CM | POA: Diagnosis present

## 2022-04-20 DIAGNOSIS — R188 Other ascites: Secondary | ICD-10-CM | POA: Diagnosis present

## 2022-04-20 DIAGNOSIS — Z7982 Long term (current) use of aspirin: Secondary | ICD-10-CM

## 2022-04-20 DIAGNOSIS — D72829 Elevated white blood cell count, unspecified: Secondary | ICD-10-CM | POA: Insufficient documentation

## 2022-04-20 DIAGNOSIS — Z7984 Long term (current) use of oral hypoglycemic drugs: Secondary | ICD-10-CM

## 2022-04-20 DIAGNOSIS — N400 Enlarged prostate without lower urinary tract symptoms: Secondary | ICD-10-CM | POA: Diagnosis present

## 2022-04-20 DIAGNOSIS — Z880 Allergy status to penicillin: Secondary | ICD-10-CM

## 2022-04-20 DIAGNOSIS — Z9049 Acquired absence of other specified parts of digestive tract: Secondary | ICD-10-CM

## 2022-04-20 DIAGNOSIS — Z85828 Personal history of other malignant neoplasm of skin: Secondary | ICD-10-CM

## 2022-04-20 LAB — CBC WITH DIFFERENTIAL/PLATELET
Abs Immature Granulocytes: 0.11 10*3/uL — ABNORMAL HIGH (ref 0.00–0.07)
Basophils Absolute: 0.1 10*3/uL (ref 0.0–0.1)
Basophils Relative: 1 %
Eosinophils Absolute: 0.1 10*3/uL (ref 0.0–0.5)
Eosinophils Relative: 1 %
HCT: 33.1 % — ABNORMAL LOW (ref 39.0–52.0)
Hemoglobin: 10.4 g/dL — ABNORMAL LOW (ref 13.0–17.0)
Immature Granulocytes: 1 %
Lymphocytes Relative: 38 %
Lymphs Abs: 5.9 10*3/uL — ABNORMAL HIGH (ref 0.7–4.0)
MCH: 25.9 pg — ABNORMAL LOW (ref 26.0–34.0)
MCHC: 31.4 g/dL (ref 30.0–36.0)
MCV: 82.5 fL (ref 80.0–100.0)
Monocytes Absolute: 3.1 10*3/uL — ABNORMAL HIGH (ref 0.1–1.0)
Monocytes Relative: 20 %
Neutro Abs: 6.4 10*3/uL (ref 1.7–7.7)
Neutrophils Relative %: 39 %
Platelets: 101 10*3/uL — ABNORMAL LOW (ref 150–400)
RBC: 4.01 MIL/uL — ABNORMAL LOW (ref 4.22–5.81)
RDW: 22.5 % — ABNORMAL HIGH (ref 11.5–15.5)
WBC: 15.7 10*3/uL — ABNORMAL HIGH (ref 4.0–10.5)
nRBC: 0.1 % (ref 0.0–0.2)

## 2022-04-20 LAB — URINALYSIS, W/ REFLEX TO CULTURE (INFECTION SUSPECTED)
Bacteria, UA: NONE SEEN
Bilirubin Urine: NEGATIVE
Glucose, UA: NEGATIVE mg/dL
Ketones, ur: NEGATIVE mg/dL
Leukocytes,Ua: NEGATIVE
Nitrite: NEGATIVE
Protein, ur: NEGATIVE mg/dL
Specific Gravity, Urine: 1.019 (ref 1.005–1.030)
Squamous Epithelial / HPF: NONE SEEN /HPF (ref 0–5)
pH: 7 (ref 5.0–8.0)

## 2022-04-20 LAB — BLOOD GAS, VENOUS
Acid-Base Excess: 0.5 mmol/L (ref 0.0–2.0)
Bicarbonate: 25.4 mmol/L (ref 20.0–28.0)
O2 Saturation: 49.1 %
Patient temperature: 37
pCO2, Ven: 41 mmHg — ABNORMAL LOW (ref 44–60)
pH, Ven: 7.4 (ref 7.25–7.43)
pO2, Ven: 34 mmHg (ref 32–45)

## 2022-04-20 LAB — COMPREHENSIVE METABOLIC PANEL
ALT: 98 U/L — ABNORMAL HIGH (ref 0–44)
AST: 149 U/L — ABNORMAL HIGH (ref 15–41)
Albumin: 3.1 g/dL — ABNORMAL LOW (ref 3.5–5.0)
Alkaline Phosphatase: 483 U/L — ABNORMAL HIGH (ref 38–126)
Anion gap: 12 (ref 5–15)
BUN: 23 mg/dL (ref 8–23)
CO2: 25 mmol/L (ref 22–32)
Calcium: 8.9 mg/dL (ref 8.9–10.3)
Chloride: 94 mmol/L — ABNORMAL LOW (ref 98–111)
Creatinine, Ser: 1.22 mg/dL (ref 0.61–1.24)
GFR, Estimated: 56 mL/min — ABNORMAL LOW (ref >60)
Glucose, Bld: 200 mg/dL — ABNORMAL HIGH (ref 70–99)
Potassium: 3.9 mmol/L (ref 3.5–5.1)
Sodium: 131 mmol/L — ABNORMAL LOW (ref 135–145)
Total Bilirubin: 4.2 mg/dL — ABNORMAL HIGH (ref 0.3–1.2)
Total Protein: 8.3 g/dL — ABNORMAL HIGH (ref 6.5–8.1)

## 2022-04-20 LAB — APTT
aPTT: 37 seconds — ABNORMAL HIGH (ref 24–36)
aPTT: 72 seconds — ABNORMAL HIGH (ref 24–36)

## 2022-04-20 LAB — TROPONIN I (HIGH SENSITIVITY)
Troponin I (High Sensitivity): 15 ng/L (ref <18)
Troponin I (High Sensitivity): 15 ng/L (ref <18)

## 2022-04-20 LAB — LACTIC ACID, PLASMA
Lactic Acid, Venous: 1.3 mmol/L (ref 0.5–1.9)
Lactic Acid, Venous: 1.2 mmol/L (ref 0.5–1.9)

## 2022-04-20 LAB — HEPARIN LEVEL (UNFRACTIONATED): Heparin Unfractionated: >1.1 IU/mL — ABNORMAL HIGH (ref 0.30–0.70)

## 2022-04-20 LAB — CBG MONITORING, ED
Glucose-Capillary: 220 mg/dL — ABNORMAL HIGH (ref 70–99)
Glucose-Capillary: 85 mg/dL (ref 70–99)

## 2022-04-20 LAB — LIPASE, BLOOD: Lipase: 29 U/L (ref 11–51)

## 2022-04-20 LAB — PROTIME-INR
INR: 1.7 — ABNORMAL HIGH (ref 0.8–1.2)
Prothrombin Time: 19.4 seconds — ABNORMAL HIGH (ref 11.4–15.2)

## 2022-04-20 MED ORDER — AMLODIPINE BESYLATE 10 MG PO TABS
10.0000 mg | ORAL_TABLET | Freq: Every day | ORAL | Status: DC
  Administered 2022-04-20 – 2022-04-23 (×4): 10 mg via ORAL
  Filled 2022-04-20 (×2): qty 1
  Filled 2022-04-20: qty 2
  Filled 2022-04-20: qty 1

## 2022-04-20 MED ORDER — HYDROMORPHONE HCL 1 MG/ML IJ SOLN
1.0000 mg | Freq: Once | INTRAMUSCULAR | Status: AC
  Administered 2022-04-20: 1 mg via INTRAVENOUS
  Filled 2022-04-20: qty 1

## 2022-04-20 MED ORDER — HEPARIN (PORCINE) 25000 UT/250ML-% IV SOLN
1400.0000 [IU]/h | INTRAVENOUS | Status: DC
  Administered 2022-04-20 – 2022-04-21 (×2): 1200 [IU]/h via INTRAVENOUS
  Filled 2022-04-20 (×2): qty 250

## 2022-04-20 MED ORDER — HYDROXYZINE HCL 10 MG PO TABS: 10.0000 mg | ORAL_TABLET | Freq: Every day | ORAL | Status: DC | PRN

## 2022-04-20 MED ORDER — GABAPENTIN 300 MG PO CAPS
300.0000 mg | ORAL_CAPSULE | Freq: Three times a day (TID) | ORAL | Status: DC
  Administered 2022-04-20 – 2022-04-23 (×9): 300 mg via ORAL
  Filled 2022-04-20 (×9): qty 1

## 2022-04-20 MED ORDER — IOHEXOL 300 MG/ML  SOLN
100.0000 mL | Freq: Once | INTRAMUSCULAR | Status: AC | PRN
  Administered 2022-04-20: 100 mL via INTRAVENOUS

## 2022-04-20 MED ORDER — ATORVASTATIN CALCIUM 20 MG PO TABS
10.0000 mg | ORAL_TABLET | ORAL | Status: DC
  Administered 2022-04-20 – 2022-04-22 (×2): 10 mg via ORAL
  Filled 2022-04-20 (×2): qty 1

## 2022-04-20 MED ORDER — MORPHINE SULFATE (PF) 2 MG/ML IV SOLN
0.5000 mg | INTRAVENOUS | Status: DC | PRN
  Administered 2022-04-20 – 2022-04-23 (×5): 0.5 mg via INTRAVENOUS
  Filled 2022-04-20 (×5): qty 1

## 2022-04-20 MED ORDER — INSULIN ASPART 100 UNIT/ML IJ SOLN
0.0000 [IU] | Freq: Every day | INTRAMUSCULAR | Status: DC
  Administered 2022-04-22: 2 [IU] via SUBCUTANEOUS
  Filled 2022-04-20: qty 1

## 2022-04-20 MED ORDER — OXYCODONE HCL 5 MG PO TABS
5.0000 mg | ORAL_TABLET | ORAL | Status: DC | PRN
  Administered 2022-04-20 – 2022-04-23 (×5): 5 mg via ORAL
  Filled 2022-04-20 (×5): qty 1

## 2022-04-20 MED ORDER — INSULIN ASPART 100 UNIT/ML IJ SOLN
0.0000 [IU] | Freq: Three times a day (TID) | INTRAMUSCULAR | Status: DC
  Administered 2022-04-20: 3 [IU] via SUBCUTANEOUS
  Administered 2022-04-21: 2 [IU] via SUBCUTANEOUS
  Administered 2022-04-21: 3 [IU] via SUBCUTANEOUS
  Administered 2022-04-21: 2 [IU] via SUBCUTANEOUS
  Administered 2022-04-22: 3 [IU] via SUBCUTANEOUS
  Administered 2022-04-22: 2 [IU] via SUBCUTANEOUS
  Administered 2022-04-22: 1 [IU] via SUBCUTANEOUS
  Administered 2022-04-23: 2 [IU] via SUBCUTANEOUS
  Filled 2022-04-20 (×8): qty 1

## 2022-04-20 MED ORDER — LISINOPRIL 20 MG PO TABS
20.0000 mg | ORAL_TABLET | Freq: Every day | ORAL | Status: DC
  Administered 2022-04-20 – 2022-04-23 (×4): 20 mg via ORAL
  Filled 2022-04-20 (×2): qty 1
  Filled 2022-04-20: qty 4
  Filled 2022-04-20: qty 2

## 2022-04-20 MED ORDER — METOPROLOL TARTRATE 25 MG PO TABS
25.0000 mg | ORAL_TABLET | Freq: Two times a day (BID) | ORAL | Status: DC
  Administered 2022-04-21 – 2022-04-23 (×6): 25 mg via ORAL
  Filled 2022-04-20 (×6): qty 1

## 2022-04-20 MED ORDER — ASPIRIN 81 MG PO TBEC
81.0000 mg | DELAYED_RELEASE_TABLET | Freq: Every day | ORAL | Status: DC
  Administered 2022-04-20 – 2022-04-23 (×4): 81 mg via ORAL
  Filled 2022-04-20 (×4): qty 1

## 2022-04-20 MED ORDER — TRAZODONE HCL 50 MG PO TABS: 50.0000 mg | ORAL_TABLET | Freq: Every day | ORAL | Status: DC

## 2022-04-20 MED ORDER — PANTOPRAZOLE SODIUM 40 MG PO TBEC
40.0000 mg | DELAYED_RELEASE_TABLET | Freq: Every day | ORAL | Status: DC
  Administered 2022-04-20 – 2022-04-23 (×4): 40 mg via ORAL
  Filled 2022-04-20 (×4): qty 1

## 2022-04-20 MED ORDER — DIPHENHYDRAMINE HCL 50 MG/ML IJ SOLN
12.5000 mg | Freq: Three times a day (TID) | INTRAMUSCULAR | Status: DC | PRN
  Administered 2022-04-20 (×2): 12.5 mg via INTRAVENOUS
  Filled 2022-04-20 (×2): qty 1

## 2022-04-20 MED ORDER — HYDRALAZINE HCL 20 MG/ML IJ SOLN
10.0000 mg | INTRAMUSCULAR | Status: DC | PRN
  Administered 2022-04-20: 10 mg via INTRAVENOUS
  Filled 2022-04-20: qty 1

## 2022-04-20 MED ORDER — INSULIN GLARGINE-YFGN 100 UNIT/ML ~~LOC~~ SOLN
16.0000 [IU] | Freq: Every day | SUBCUTANEOUS | Status: DC
  Administered 2022-04-21 – 2022-04-23 (×3): 16 [IU] via SUBCUTANEOUS
  Filled 2022-04-20 (×4): qty 0.16

## 2022-04-20 MED ORDER — MORPHINE SULFATE (PF) 4 MG/ML IV SOLN
4.0000 mg | Freq: Once | INTRAVENOUS | Status: AC
  Administered 2022-04-20: 4 mg via INTRAVENOUS
  Filled 2022-04-20: qty 1

## 2022-04-20 MED ORDER — SODIUM CHLORIDE 0.9 % IV BOLUS
1000.0000 mL | Freq: Once | INTRAVENOUS | Status: AC
  Administered 2022-04-20: 1000 mL via INTRAVENOUS

## 2022-04-20 MED ORDER — EZETIMIBE 10 MG PO TABS
10.0000 mg | ORAL_TABLET | Freq: Every day | ORAL | Status: DC
  Administered 2022-04-20 – 2022-04-23 (×4): 10 mg via ORAL
  Filled 2022-04-20 (×5): qty 1

## 2022-04-20 NOTE — ED Notes (Signed)
MD Tamala Julian at bedside speaking with pt. Advised to call respiratory for high flow [place]. Respiratory called and otw.

## 2022-04-20 NOTE — ED Provider Notes (Addendum)
[facility] Provider Note    Event Date/Time   First MD Initiated Contact with Patient 04/20/22 [phone]     (approximate)   History   Abdominal Pain and Emesis   HPI  Randy Frazier is a 89 y.o. male   Past medical history of hepatocellular carcinoma, recent PE diagnosed while hospitalized several days ago and was started on Eliquis, prior stroke, CAD, diabetes, presents to the emergency department with abdominal pain nausea and vomiting.  He felt well upon his discharge from the hospital yesterday but since being home he redeveloped right upper quadrant pain and had several episodes of emesis.  He denies fevers chills respiratory complaints GU complaints and has had no falls or trauma.  He has not yet started treatment for his liver cancer   External Medical Documents Reviewed: Discharge summary dated 04/19/2022 for PE discharged on Eliquis      Physical Exam   Triage Vital Signs: ED Triage Vitals  Enc Vitals Group     BP 04/20/22 0949 (!) 115/55     Pulse Rate 04/20/22 0949 67     Resp 04/20/22 0949 18     Temp 04/20/22 0949 97.7 F (36.5 C)     Temp Source 04/20/22 0949 Oral     SpO2 04/20/22 0949 96 %     Weight 04/20/22 0947 165 lb 5.5 oz (75 kg)     Height 04/20/22 0947 6' (1.829 m)     Head Circumference --      Peak Flow --      Pain Score 04/20/22 0947 10     Pain Loc --      Pain Edu? --      Excl. in [place]? --     Most recent vital signs: Vitals:   04/20/22 0949  BP: (!) 115/55  Pulse: 67  Resp: 18  Temp: 97.7 F (36.5 C)  SpO2: 96%    General: Awake, no distress.  CV:  Good peripheral perfusion.  Resp:  Normal effort.  Abd:  No distention.  Other:  Abdominal tenderness to the epigastrium right upper quadrant with some voluntary guarding, blood pressure is slightly low at 115/55 afebrile otherwise vital signs within normal limit.  Skin appears warm well-perfused mentation is normal alert oriented   ED Results /  Procedures / Treatments   Labs (all labs ordered are listed, but only abnormal results are displayed) Labs Reviewed  COMPREHENSIVE METABOLIC PANEL - Abnormal; Notable for the following components:      Result Value   Sodium 131 (*)    Chloride 94 (*)    Glucose, Bld 200 (*)    Total Protein 8.3 (*)    Albumin 3.1 (*)    AST 149 (*)    ALT 98 (*)    Alkaline Phosphatase 483 (*)    Total Bilirubin 4.2 (*)    GFR, Estimated 56 (*)    All other components within normal limits  CBC WITH DIFFERENTIAL/PLATELET - Abnormal; Notable for the following components:   WBC 15.7 (*)    RBC 4.01 (*)    Hemoglobin 10.4 (*)    HCT 33.1 (*)    MCH 25.9 (*)    RDW 22.5 (*)    Platelets 101 (*)    All other components within normal limits  LACTIC ACID, PLASMA  LIPASE, BLOOD  LACTIC ACID, PLASMA  URINALYSIS, W/ REFLEX TO CULTURE (INFECTION SUSPECTED)  TROPONIN I (HIGH SENSITIVITY)  TROPONIN I (HIGH SENSITIVITY)  I ordered and reviewed the above labs they are notable for his liver enzymes are elevated his AST is 140s from 50 and his ALT is 90s from 40  EKG  ED ECG REPORT I, Lucillie Garfinkel, the attending physician, personally viewed and interpreted this ECG.   Date: 04/20/2022  EKG Time: 1025  Rate: 67  Rhythm: nsr  Axis: nl  Intervals:rbbb and lafb, qtc 500  ST&T Change: no acute ischemic changes    RADIOLOGY I independently reviewed and interpreted CT of the abdomen pelvis and see a mass within the liver   PROCEDURES:  Critical Care performed: Yes, see critical care procedure note(s)  .Critical Care  Performed by: Lucillie Garfinkel, MD Authorized by: Lucillie Garfinkel, MD   Critical care provider statement:    Critical care time (minutes):  30   Critical care was time spent personally by me on the following activities:  Development of treatment plan with patient or surrogate, discussions with consultants, evaluation of patient's response to treatment, examination of patient, ordering  and review of laboratory studies, ordering and review of radiographic studies, ordering and performing treatments and interventions, pulse oximetry, re-evaluation of patient's condition and review of old charts    MEDICATIONS ORDERED IN ED: Medications  HYDROmorphone (DILAUDID) injection 1 mg (has no administration in time range)  sodium chloride 0.9 % bolus 1,000 mL (1,000 mLs Intravenous New Bag/Given 04/20/22 1031)  morphine (PF) 4 MG/ML injection 4 mg (4 mg Intravenous Given 04/20/22 1031)  iohexol (OMNIPAQUE) 300 MG/ML solution 100 mL (100 mLs Intravenous Contrast Given 04/20/22 1104)    External physician / consultants:  I spoke with hospitalist for admission and regarding care plan for this patient.   IMPRESSION / MDM / ASSESSMENT AND PLAN / ED COURSE  I reviewed the triage vital signs and the nursing notes.                                Patient's presentation is most consistent with acute presentation with potential threat to life or bodily function.  Differential diagnosis includes, but is not limited to, intra-abdominal infection, obstruction, perforated viscus, progression of malignancy, gastroenteritis, sepsis   The patient is on the cardiac monitor to evaluate for evidence of arrhythmia and/or significant heart rate changes.  MDM: This is a patient who started on blood thinners for PE and has hepatocellular carcinoma who was discharged recently comes back to the emergency department with severe abdominal pain nausea and vomiting we will get a CT scan of the abdomen and pelvis with IV contrast to assess for surgical pathologies accounting for his abdominal pain, give fluids, avoid QTc prolonging medications in the setting of QTc prolongation.  Not actively vomiting.  Will give IV morphine for pain control   CT scan shows increased tumor burden versus blood clot within the intrahepatic ducts, as well as inflammation around the pancreas.  Patient continues to have significant  amount of pain, will give IV Dilaudid.  He has been on Eliquis for PE since leaving the hospital 2 days ago but with suspected large blood clot in the intrahepatic ducts I will start him on IV heparin in place of his oral Eliquis, and admit him for pain control/pancreatitis/VTE.  I spoke with Dr. Doyne Keel of hematology/oncology to inform of these findings and our plan for keeping Randy Frazier here at [facility] for pain control and IV heparin, no indication for transfer at this time.  FINAL CLINICAL IMPRESSION(S) / ED DIAGNOSES   Final diagnoses:  Hepatocellular carcinoma ([place])  Epigastric pain  Acute pancreatitis, unspecified complication status, unspecified pancreatitis type  VTE (venous thromboembolism)     Rx / DC Orders   ED Discharge Orders     None        Note:  This document was prepared using Dragon voice recognition software and may include unintentional dictation errors.    Lucillie Garfinkel, MD 04/20/22 1229    Lucillie Garfinkel, MD 04/20/22 [phone]

## 2022-04-20 NOTE — H&P (Signed)
History and Physical    Randy Frazier [id number] DOB: 11/08/1927 DOA: 04/20/2022  Referring MD/NP/PA:   PCP: Pcp, No   Patient coming from:  The patient is coming from home.   Chief Complaint: abdominal pain  HPI: Randy Frazier is a 89 y.o. male with medical history significant of  CAD s/p CABG on DAPT, DM, HTN, HLD, stroke, gout, GERD, depression with anxiety, BPH, CKD-3a, recently diagnosed hepatocellular carcinoma and CLL, thrombocytopenia, recent admission due to acute PE on Eliquis, who presents with nausea, vomiting, abdominal pain.   Patient states that he has worsening abdominal pain in the past several days, which is diffuse, constant, severe, sharp, nonradiating.  Associated with nausea and multiple episodes of nonbilious nonbloody vomiting.  No diarrhea, no fever or chills.  Patient does not have chest pain, cough, shortness of breath.  No symptoms of UTI.  No fall or rectal bleeding.  No dark stool.  He took last dose of Eliquis last night.  Data reviewed independently and ED Course: pt was found to have WBC 15.7, stable renal function, temperature normal, blood pressure 115/55, heart rate 74, RR 18, oxygen saturation 98% on room air.  Patient is admitted to PCU as inpatient.  Dr. Darrall Dears of oncology is consulted.   CT-abd/pelvis: 1. Large burden of infiltrative tumor throughout the left lobe of the liver, as above, with interval prominence of the central intrahepatic bile ducts and the proximal common hepatic duct, the appearance of which may suggest direct tumor infiltration. However, this is new compared to prior study from only 2 days ago such that the possibility of clot within the intrahepatic biliary tree may be more likely. Surprisingly, this is not associated with proximal intrahepatic biliary ductal dilatation at this time. 2. Extensive lymphadenopathy in the upper abdomen and lower middle mediastinum, likely metastatic. 3. Extensive soft tissue  stranding adjacent to the head and body of the pancreas extending into the root of the small bowel mesentery with trace volume of ascites. Clinical correlation for signs and symptoms of pancreatitis is recommended. 4. Colonic diverticulosis without evidence of acute diverticulitis at this time. 5. Aortic atherosclerosis, in addition to left main and three-vessel coronary artery disease. 6. There are calcifications of the aortic valve. Echocardiographic correlation for evaluation of potential valvular dysfunction may be warranted if clinically indicated. 7. Additional incidental findings, as above.   EKG: I have personally reviewed.  Sinus rhythm, QTc 503, bifascicular block, poor R wave progression  Review of Systems:   General: no fevers, chills, no body weight gain, has poor appetite, has fatigue HEENT: no blurry vision, hearing changes or sore throat Respiratory: no dyspnea, coughing, wheezing CV: no chest pain, no palpitations GI: has nausea, vomiting, abdominal pain, no diarrhea, constipation GU: no dysuria, burning on urination, increased urinary frequency, hematuria  Ext: no leg edema Neuro: no unilateral weakness, numbness, or tingling, no vision change or hearing loss Skin: no rash, no skin tear. MSK: No muscle spasm, no deformity, no limitation of range of movement in spin Heme: No easy bruising.  Travel history: No recent long distant travel.   Allergy:  Allergies  Allergen Reactions   Ibuprofen Swelling   Penicillins Swelling and Hives    lips Other reaction(s): Other (See Comments) Edema (lips)    Past Medical History:  Diagnosis Date   Actinic keratosis    Arrhythmia    A-Fib   BPH (benign prostatic hyperplasia)    Coronary artery disease 02/11/2007   CABG X 3  @  [facility]   CVA (cerebral infarction) 02/11/2012   Diabetes mellitus without complication (HCC)    Enlarged prostate    GERD (gastroesophageal reflux disease)    Hyperlipidemia    Hypertension     Kidney stone    Renal disorder    Skin cancer    scalp, tx by Dr Sharlett Iles 2000s?    Past Surgical History:  Procedure Laterality Date   cataract surgery Bilateral    CORONARY ARTERY BYPASS GRAFT  2009   CABG X 3 @ [facility]   LIVER BIOPSY  01/06/2022   Procedure: LIVER BIOPSY;  Surgeon: Ronny Bacon, MD;  Location: [facility] ORS;  Service: General;;   TEAR DUCT PROBING Right 05/07/2016   Procedure: Evacuation of hematoma, right peri-orbital bleed.;  Surgeon: Clista Bernhardt, MD;  Location: [place];  Service: Ophthalmology;  Laterality: Right;    Social History:  reports that he has never smoked. He has never used smokeless tobacco. He reports that he does not drink alcohol and does not use drugs.  Family History: Patient does not know detailed information about his family medical history. Family History  Family history unknown: Yes     Prior to Admission medications   Medication Sig Start Date End Date Taking? Authorizing Provider  amLODipine (NORVASC) 10 MG tablet TAKE 1 TABLET BY MOUTH EVERY DAY 10/03/12   Minna Merritts, MD  apixaban (ELIQUIS) 5 MG TABS tablet Take 1 tablet (5 mg total) by mouth 2 (two) times daily. 05/19/22   Mercy Riding, MD  APIXABAN Arne [place]) VTE STARTER PACK ('10MG'$  AND '5MG'$ ) Take as directed on package: start with two-'5mg'$  tablets twice daily for 7 days. On day 8, switch to one-'5mg'$  tablet twice daily. 04/19/22   Mercy Riding, MD  aspirin EC 81 MG tablet Take 81 mg by mouth.    [provider]  atorvastatin (LIPITOR) 10 MG tablet Take 1 tablet (10 mg total) by mouth every other day. 12/05/19   Minna Merritts, MD  ezetimibe (ZETIA) 10 MG tablet Take 10 mg by mouth daily.    [provider]  FLUoxetine (PROZAC) 20 MG capsule Take 20 mg by mouth daily.    [provider]  gabapentin (NEURONTIN) 300 MG capsule Take 300 mg by mouth 3 (three) times daily.  01/24/12   [provider]  hydrOXYzine (ATARAX) 10 MG tablet Take 1 tablet  (10 mg total) by mouth daily as needed for itching. 03/24/22   Verlon Au, NP  insulin detemir (LEVEMIR) 100 UNIT/ML injection Inject 0.16 mLs (16 Units total) into the skin daily. Patient taking differently: Inject 26 Units into the skin daily. 01/09/22   Sharen Hones, MD  insulin lispro (HUMALOG) 100 UNIT/ML injection Inject 20 Units into the skin 2 (two) times daily. 05/12/16   [provider]  lidocaine-prilocaine (EMLA) cream Apply to affected area once 01/26/22   Sindy Guadeloupe, MD  lisinopril (PRINIVIL,ZESTRIL) 20 MG tablet Take 20 mg by mouth daily. 01/27/13   [provider]  Magnesium 500 MG TABS Take 500 mg by mouth daily.    [provider]  metFORMIN (GLUCOPHAGE-XR) 500 MG 24 hr tablet Take 500 mg by mouth 2 (two) times daily with a meal. 05/12/16   [provider]  metoprolol tartrate (LOPRESSOR) 25 MG tablet Take 25 mg by mouth 2 (two) times daily.    [provider]  Multiple Vitamin (MULTIVITAMIN WITH MINERALS) TABS tablet Take 1 tablet by mouth daily. 04/20/22   Cyndia Skeeters,  Charlesetta Ivory, MD  nystatin (MYCOSTATIN) 100000 UNIT/ML suspension Take 5 mLs by mouth 4 (four) times daily. 01/15/22   [provider]  ondansetron (ZOFRAN) 8 MG tablet Take 1 tablet (8 mg total) by mouth every 8 (eight) hours as needed for nausea or vomiting. 01/26/22   Sindy Guadeloupe, MD  oxyCODONE (OXY IR/ROXICODONE) 5 MG immediate release tablet Take 1 tablet (5 mg total) by mouth every 4 (four) hours as needed for moderate pain. 01/09/22   Sharen Hones, MD  pantoprazole (PROTONIX) 40 MG tablet Take 1 tablet (40 mg total) by mouth daily. 01/10/22 04/18/22  Sharen Hones, MD  prochlorperazine (COMPAZINE) 10 MG tablet Take 1 tablet (10 mg total) by mouth every 6 (six) hours as needed for nausea or vomiting. 01/26/22   Sindy Guadeloupe, MD  traZODone (DESYREL) 50 MG tablet Take 1 tablet (50 mg total) by mouth at bedtime. 04/08/22   Sindy Guadeloupe, MD    Physical Exam: Vitals:    04/20/22 1800 04/20/22 1816 04/20/22 1830 04/20/22 1900  BP: (!) 155/73 (!) 155/73 (!) 163/83 (!) 159/78  Pulse: (!) 104  (!) 105 100  Resp: (!) 29  (!) 27 (!) 24  Temp:      TempSrc:      SpO2: (!) 86%  93% 92%  Weight:      Height:       General: Not in acute distress HEENT:       Eyes: PERRL, EOMI, no scleral icterus.       ENT: No discharge from the ears and nose, no pharynx injection, no tonsillar enlargement.        Neck: No JVD, no bruit, no mass felt. Heme: No neck lymph node enlargement. Cardiac: S1/S2, RRR, No murmurs, No gallops or rubs. Respiratory: No rales, wheezing, rhonchi or rubs. GI: Mildly distended, diffusely tenderness, no rebound pain, no organomegaly, BS present. GU: No hematuria Ext: No pitting leg edema bilaterally. 1+DP/PT pulse bilaterally. Musculoskeletal: No joint deformities, No joint redness or warmth, no limitation of ROM in spin. Skin: No rashes.  Neuro: Alert, oriented X3, cranial nerves II-XII grossly intact, moves all extremities normally. Psych: Patient is not psychotic, no suicidal or hemocidal ideation.  Labs on Admission: I have personally reviewed following labs and imaging studies  CBC: Recent Labs  Lab 04/18/22 0254 04/19/22 0629 04/20/22 1005  WBC 17.7* 16.0* 15.7*  NEUTROABS  --   --  6.4  HGB 11.3* 10.1* 10.4*  HCT 36.3* 31.9* 33.1*  MCV 82.9 82.6 82.5  PLT 139* 133* [id number]*   Basic Metabolic Panel: Recent Labs  Lab 04/18/22 0254 04/19/22 0623 04/19/22 0629 04/20/22 1005  NA 131*  --  132* 131*  K 4.4  --  4.4 3.9  CL 98  --  98 94*  CO2 23  --  26 25  GLUCOSE 237*  --  119* 200*  BUN 23  --  20 23  CREATININE 1.21  --  1.07 1.22  CALCIUM 8.8*  --  9.1 8.9  MG  --  1.7  --   --    GFR: Estimated Creatinine Clearance: 41 mL/min (by C-G formula based on SCr of 1.22 mg/dL). Liver Function Tests: Recent Labs  Lab 04/18/22 0254 04/19/22 0623 04/20/22 1005  AST 91* 57* 149*  ALT 65* 46* 98*  ALKPHOS 329* 296*  483*  BILITOT 1.1 1.0 4.2*  PROT 8.0 7.6 8.3*  ALBUMIN 3.1* 3.1* 3.1*   Recent Labs  Lab 04/18/22  [id number] 04/20/22 1005  LIPASE 32 29   No results for input(s): "AMMONIA" in the last 168 hours. Coagulation Profile: Recent Labs  Lab 04/18/22 0337 04/20/22 1239  INR 1.2 1.7*   Cardiac Enzymes: No results for input(s): "CKTOTAL", "CKMB", "CKMBINDEX", "TROPONINI" in the last 168 hours. BNP (last 3 results) No results for input(s): "PROBNP" in the last 8760 hours. HbA1C: Recent Labs    04/18/22 0254  HGBA1C 7.6*   CBG: Recent Labs  Lab 04/18/22 1338 04/18/22 1716 04/18/22 2056 04/19/22 0734 04/20/22 1703  GLUCAP 217* 207* 183* 121* 220*   Lipid Profile: No results for input(s): "CHOL", "HDL", "LDLCALC", "TRIG", "CHOLHDL", "LDLDIRECT" in the last 72 hours. Thyroid Function Tests: No results for input(s): "TSH", "T4TOTAL", "FREET4", "T3FREE", "THYROIDAB" in the last 72 hours. Anemia Panel: No results for input(s): "VITAMINB12", "FOLATE", "FERRITIN", "TIBC", "IRON", "RETICCTPCT" in the last 72 hours. Urine analysis:    Component Value Date/Time   COLORURINE YELLOW (A) 04/20/2022 1214   APPEARANCEUR CLEAR (A) 04/20/2022 1214   APPEARANCEUR Clear 03/30/2013 0839   LABSPEC 1.019 04/20/2022 1214   LABSPEC 1.023 03/30/2013 0839   PHURINE 7.0 04/20/2022 1214   GLUCOSEU NEGATIVE 04/20/2022 1214   GLUCOSEU 150 mg/dL 03/30/2013 0839   HGBUR SMALL (A) 04/20/2022 1214   BILIRUBINUR NEGATIVE 04/20/2022 1214   BILIRUBINUR Negative 03/30/2013 0839   KETONESUR NEGATIVE 04/20/2022 1214   PROTEINUR NEGATIVE 04/20/2022 1214   NITRITE NEGATIVE 04/20/2022 1214   LEUKOCYTESUR NEGATIVE 04/20/2022 1214   LEUKOCYTESUR Negative 03/30/2013 0839   Sepsis Labs: '@LABRCNTIP'$ (procalcitonin:4,lacticidven:4) ) Recent Results (from the past 240 hour(s))  Resp panel by RT-PCR (RSV, Flu A&B, Covid) Anterior Nasal Swab     Status: None   Collection Time: 04/18/22  2:58 AM   Specimen: Anterior  Nasal Swab  Result Value Ref Range Status   SARS Coronavirus 2 by RT PCR NEGATIVE NEGATIVE Final    Comment: (NOTE) SARS-CoV-2 target nucleic acids are NOT DETECTED.  The SARS-CoV-2 RNA is generally detectable in upper respiratory specimens during the acute phase of infection. The lowest concentration of SARS-CoV-2 viral copies this assay can detect is 138 copies/mL. A negative result does not preclude SARS-Cov-2 infection and should not be used as the sole basis for treatment or other patient management decisions. A negative result may occur with  improper specimen collection/handling, submission of specimen other than nasopharyngeal swab, presence of viral mutation(s) within the areas targeted by this assay, and inadequate number of viral copies(<138 copies/mL). A negative result must be combined with clinical observations, patient history, and epidemiological information. The expected result is Negative.  Fact Sheet for Patients:  EntrepreneurPulse.com.au  Fact Sheet for Healthcare Providers:  IncredibleEmployment.be  This test is no t yet approved or cleared by the [place] FDA and  has been authorized for detection and/or diagnosis of SARS-CoV-2 by FDA under an Emergency Use Authorization (EUA). This EUA will remain  in effect (meaning this test can be used) for the duration of the COVID-19 declaration under Section 564(b)(1) of the Act, 21 U.S.C.section 360bbb-3(b)(1), unless the authorization is terminated  or revoked sooner.       Influenza A by PCR NEGATIVE NEGATIVE Final   Influenza B by PCR NEGATIVE NEGATIVE Final    Comment: (NOTE) The Xpert Xpress SARS-CoV-2/FLU/RSV plus assay is intended as an aid in the diagnosis of influenza from Nasopharyngeal swab specimens and should not be used as a sole basis for treatment. Nasal washings and aspirates are unacceptable for Xpert Xpress SARS-CoV-2/FLU/RSV testing.  Fact Sheet for  Patients: EntrepreneurPulse.com.au  Fact Sheet for Healthcare Providers: IncredibleEmployment.be  This test is not yet approved or cleared by the [place] FDA and has been authorized for detection and/or diagnosis of SARS-CoV-2 by FDA under an Emergency Use Authorization (EUA). This EUA will remain in effect (meaning this test can be used) for the duration of the COVID-19 declaration under Section 564(b)(1) of the Act, 21 U.S.C. section 360bbb-3(b)(1), unless the authorization is terminated or revoked.     Resp Syncytial Virus by PCR NEGATIVE NEGATIVE Final    Comment: (NOTE) Fact Sheet for Patients: EntrepreneurPulse.com.au  Fact Sheet for Healthcare Providers: IncredibleEmployment.be  This test is not yet approved or cleared by the [place] FDA and has been authorized for detection and/or diagnosis of SARS-CoV-2 by FDA under an Emergency Use Authorization (EUA). This EUA will remain in effect (meaning this test can be used) for the duration of the COVID-19 declaration under Section 564(b)(1) of the Act, 21 U.S.C. section 360bbb-3(b)(1), unless the authorization is terminated or revoked.  Performed at [facility], [address] [place], [place] [address]      Radiological Exams on Admission: CT Abdomen Pelvis W Contrast  Result Date: 04/20/2022 CLINICAL DATA:  89 year old male with presenting with abdominal pain, suspicious for bowel obstruction. EXAM: CT ABDOMEN AND PELVIS WITH CONTRAST TECHNIQUE: Multidetector CT imaging of the abdomen and pelvis was performed using the standard protocol following bolus administration of intravenous contrast. RADIATION DOSE REDUCTION: This exam was performed according to the departmental dose-optimization program which includes automated exposure control, adjustment of the mA and/or kV according to patient size and/or use of iterative reconstruction  technique. CONTRAST:  172m OMNIPAQUE IOHEXOL 300 MG/ML  SOLN COMPARISON:  CT of the abdomen and pelvis 04/18/2022. FINDINGS: Lower chest: Scattered areas of scarring and mild fibrosis in the lung bases bilaterally. Atherosclerotic calcifications in the descending thoracic aorta as well as the left main, left anterior descending, left circumflex and right coronary arteries. Calcifications of the aortic valve. Calcifications of the mitral prominent enhancing middle mediastinal lymph nodes adjacent to the distal esophagus measuring up to 1.3 cm at the level of the esophageal hiatus (axial image 18 of series 2), likely metastatic. Hepatobiliary: Nodular contour of the liver indicative of advanced cirrhosis. Numerous small poorly defined hypovascular lesions scattered throughout the hepatic parenchyma. In addition, in the left lobe of the liver involving portions of segments 2 and 3 there is a poorly defined infiltrative mass-like area measuring 9.3 x 7.9 cm, similar to the recent prior study. Along the superior margin of this there is a hypervascular component best appreciated on axial image 21 of series 2. There is also a wedge-shaped hypovascular component on axial image 19 of series 2, stable compared to the recent prior study. This entire region is suspicious for infiltrative tumor. In the hepatic hilum extending up into the central liver (axial images 28-32 of series 2 and coronal image 35 of series 5) there is intermediate to high attenuation material (67 HU) which appears to dilate the central hepatic ducts and the proximal aspect of the common hepatic duct (which measures up to 13 mm in diameter on coronal image 35 of series 5), concerning for infiltrative tumor, however, this appearance is new compared to the prior study from only 2 days ago, such that the possibility of clot within the bile ducts warrants consideration. Distal common bile duct tapers to normal caliber. Status post cholecystectomy. Pancreas:  No pancreatic mass. No pancreatic ductal  dilatation. Peripancreatic soft tissue stranding is noted adjacent to the head and proximal body of the pancreas extending into the root of the small bowel mesentery where there is also trace amount of fluid. No well organized peripancreatic fluid collections are noted. Spleen: Unremarkable. Adrenals/Urinary Tract: Low-attenuation lesions in both kidneys compatible with simple cysts (no imaging follow-up recommended), largest of which is exophytic extending off the anterior aspect of the lower pole of the right kidney measuring up to 8.9 cm in diameter. No aggressive appearing renal lesions. No hydroureteronephrosis. Urinary bladder is unremarkable in appearance. Right adrenal gland is normal in appearance. 2.0 x 2.2 cm left adrenal nodule, similar to the most recent prior examination, but enlarged compared to more remote prior studies, likely a metastatic lesion. Stomach/Bowel: The appearance of the stomach is normal. There is no pathologic dilatation of small bowel or colon. Numerous colonic diverticula are noted, particularly in the sigmoid colon, without definite focal surrounding inflammatory changes to indicate an acute diverticulitis at this time. Normal appendix. Vascular/Lymphatic: Aortic atherosclerosis, without evidence of aneurysm or dissection in the abdominal or pelvic vasculature. Portal vein is patent. Enlarged portacaval lymph node measuring 2 cm in short axis (axial image 37 of series 2). Enlarged hepatoduodenal ligament lymph nodes measuring up to 1.9 cm in short axis (axial image 33 of series 2). Reproductive: Prostate gland is enlarged and heterogeneous in appearance measuring 5.6 x 5.1 cm. Seminal vesicles are unremarkable in appearance. Other: Trace volume of ascites, decreased compared to the prior study. No pneumoperitoneum. Musculoskeletal: Median sternotomy wires. There are no aggressive appearing lytic or blastic lesions noted in the visualized  portions of the skeleton. IMPRESSION: 1. Large burden of infiltrative tumor throughout the left lobe of the liver, as above, with interval prominence of the central intrahepatic bile ducts and the proximal common hepatic duct, the appearance of which may suggest direct tumor infiltration. However, this is new compared to prior study from only 2 days ago such that the possibility of clot within the intrahepatic biliary tree may be more likely. Surprisingly, this is not associated with proximal intrahepatic biliary ductal dilatation at this time. 2. Extensive lymphadenopathy in the upper abdomen and lower middle mediastinum, likely metastatic. 3. Extensive soft tissue stranding adjacent to the head and body of the pancreas extending into the root of the small bowel mesentery with trace volume of ascites. Clinical correlation for signs and symptoms of pancreatitis is recommended. 4. Colonic diverticulosis without evidence of acute diverticulitis at this time. 5. Aortic atherosclerosis, in addition to left main and three-vessel coronary artery disease. 6. There are calcifications of the aortic valve. Echocardiographic correlation for evaluation of potential valvular dysfunction may be warranted if clinically indicated. 7. Additional incidental findings, as above. Electronically Signed   By: Vinnie Langton M.D.   On: 04/20/2022 11:53      Assessment/Plan Principal Problem:   Abdominal pain Active Problems:   Hepatocellular carcinoma (HCC)   Cirrhosis (HCC)   CAD, AUTOLOGOUS BYPASS GRAFT   Abnormal LFTs   Hypertensive urgency   CLL (chronic lymphocytic leukemia) (HCC)   Hypertension   History of CVA (cerebrovascular accident)   Thrombocytopenia ([place])   Hyperlipidemia   Type II diabetes mellitus with renal manifestations (HCC)   Chronic kidney disease, stage 3a ([place])   Pulmonary embolism (HCC)   Depression with anxiety   Assessment and Plan:   Abdominal pain: CT scan shows increased tumor  burden versus blood clot within the intrahepatic ducts, as well as inflammation around the  pancreas.  EDP consulted Dr. Dr. Doyne Keel of hematology/oncology, per Dr. Darrall Dears, it is OK to keep pt in [facility].   -Admitted to PCU as inpatient -Switch Eliquis to IV heparin -Pain control: As needed morphine, oxycodone  Pulmonary embolism: -On IV heparin  Hepatocellular carcinoma (HCC) -Follow-up oncologist recommendation  Cirrhosis ([place]) -Check INR and PTT -Check ammonia level  Hx of CAD, AUTOLOGOUS BYPASS GRAFT and myocardial injury:  -Aspirin, Lipitor   Hepatocellular carcinoma ([place]): Recently diagnosed with hepatocellular carcinoma Stage IIIa. Patient was referred to [facility], planning to start chemotherapy, but not yet. -f/u in [facility]    Abnormal LFTs: Secondary to [facility] -Avoid using Tylenol   CLL (chronic lymphocytic leukemia) ([place]): Recently diagnosed. WBC 15.7 today. Per Dr. Elroy Channel clinic note on 2/27, this can be treated conservatively. -Follow-up with oncology   Hypertension: -IV hydralazine as needed -Amlodipine, lisinopril, metoprolol   History of CVA (cerebrovascular accident):  -Aspirin, Lipitor   Thrombocytopenia ([place]): This is chronic issue.  Platelets are 101 today -Follow-up with CBC   Hyperlipidemia -Lipitor   Type II diabetes mellitus with renal manifestations [facility]): Recent A1c 7.6.  Patient is on metformin, Humalog. he is taking Levemir 26-40 units daily at bedtime -Sliding scale insulin -Levemir 16 units at bedtime daily   Chronic kidney disease, stage 3a ([place]): Stable.  -Follow-up with BMP   Depression with anxiety -Hold Prozac, trazodone due to QTc prolonged patient    DVT ppx: IV Heparin   Code Status: DNR per pt and his son  Family Communication:     Yes, patient's  son by phone  Disposition Plan:  Anticipate discharge back to previous environment  Consults called: Dr. Darrall Dears of oncology  Admission status and Level of care: Progressive:     as inpt        Dispo: The patient is from: Home              Anticipated d/c is to: Home              Anticipated d/c date is: 2 days              Patient currently is not medically stable to d/c.    Severity of Illness:  The appropriate patient status for this patient is INPATIENT. Inpatient status is judged to be reasonable and necessary in order to provide the required intensity of service to ensure the patient's safety. The patient's presenting symptoms, physical exam findings, and initial radiographic and laboratory data in the context of their chronic comorbidities is felt to place them at high risk for further clinical deterioration. Furthermore, it is not anticipated that the patient will be medically stable for discharge from the hospital within 2 midnights of admission.   * I certify that at the point of admission it is my clinical judgment that the patient will require inpatient hospital care spanning beyond 2 midnights from the point of admission due to high intensity of service, high risk for further deterioration and high frequency of surveillance required.*       Date of Service 04/20/2022    Ivor Costa [facility]   If 7PM-7AM, please contact night-coverage [email or web address] 04/20/2022, 7:50 PM

## 2022-04-20 NOTE — ED Notes (Signed)
RN present and put patient on O2

## 2022-04-20 NOTE — Progress Notes (Signed)
[place] for heparin infusion Indication: pulmonary embolus  Allergies  Allergen Reactions   Ibuprofen Swelling   Penicillins Swelling and Hives    lips Other reaction(s): Other (See Comments) Edema (lips)    Patient Measurements: Height: 6' (182.9 cm) Weight: 75 kg (165 lb 5.5 oz) IBW/kg (Calculated) : 77.6 Heparin Dosing Weight: 61.2 kg  Vital Signs: BP: 159/78 (03/10 1900) Pulse Rate: 100 (03/10 1900)  Labs: Recent Labs    04/18/22 0254 04/18/22 0337 04/18/22 1429 04/18/22 1429 04/18/22 2327 04/19/22 0629 04/19/22 0827 04/20/22 1005 04/20/22 1214 04/20/22 1239 04/20/22 2122  HGB 11.3*  --   --   --   --  10.1*  --  10.4*  --   --   --   HCT 36.3*  --   --   --   --  31.9*  --  33.1*  --   --   --   PLT 139*  --   --   --   --  133*  --  101*  --   --   --   APTT  --   --  40*  --   --   --   --   --   --  37* 72*  LABPROT  --  15.0  --   --   --   --   --   --   --  19.4*  --   INR  --  1.2  --   --   --   --   --   --   --  1.7*  --   HEPARINUNFRC  --   --  0.10*   < > 0.18*  --  0.29*  --   --  >1.10*  --   CREATININE 1.21  --   --   --   --  1.07  --  1.22  --   --   --   TROPONINIHS 27*  --   --   --   --   --   --  15 15  --   --    < > = values in this interval not displayed.    Estimated Creatinine Clearance: 41 mL/min (by C-G formula based on SCr of 1.22 mg/dL).  Medical History: Past Medical History:  Diagnosis Date   Actinic keratosis    Arrhythmia    A-Fib   BPH (benign prostatic hyperplasia)    Coronary artery disease 02/11/2007   CABG X 3  @ [facility]   CVA (cerebral infarction) 02/11/2012   Diabetes mellitus without complication (HCC)    Enlarged prostate    GERD (gastroesophageal reflux disease)    Hyperlipidemia    Hypertension    Kidney stone    Renal disorder    Skin cancer    scalp, tx by Dr Sharlett Iles 2000s?    Assessment: Pt is a 89 yo male presenting to ED c/o abdominal pain with n/v,  reporting receiving tx for liver cancer, found "positive for segmental right upper lobe pulmonary emboli." Patient was discharge yesterday afternoon after transition from heparin infusion to Eliquis and receiving 1st dose prior to discharge. Patient to presents with persistent abd pain and placed on NPO diet. Pharmacy consulted to resume heparin infusion for PE treatment.   Per patient hx his last Eliquis dose was yesterday 3/9 at 22:00  *Baseline aPTT and heparin ordered. Plt = 101, Hgb 10.4*  Date Time aPTT/HL Rate/Comment 3/10  1239 37/ >1.10 3/11 2122 72/ ---  Therapeutic x1 / 1200 u/hr   Goal of Therapy:  Heparin level 0.3-0.7 units/ml Monitor platelets by anticoagulation protocol: Yes    Plan: Continue heparin infusion at 1200 units/hr Check aPTT/Anti-Xa level in 6 hours and daily once consecutively therapeutic.  Titrate by aPTT's until lab correlation is noted, then titrate by anti-xa alone. Continue to monitor H&H and platelets daily while on heparin gtt.  [place] Pharmacist 04/20/2022 9:58 PM

## 2022-04-20 NOTE — ED Notes (Signed)
Floor coverage provider at bedside.

## 2022-04-20 NOTE — ED Notes (Addendum)
Discussed plan of care with floor coverage provider - xray and further assessment.

## 2022-04-20 NOTE — Progress Notes (Signed)
[place] for heparin infusion Indication: pulmonary embolus  Allergies  Allergen Reactions   Ibuprofen Swelling   Penicillins Swelling and Hives    lips Other reaction(s): Other (See Comments) Edema (lips)    Patient Measurements: Height: 6' (182.9 cm) Weight: 75 kg (165 lb 5.5 oz) IBW/kg (Calculated) : 77.6 Heparin Dosing Weight: 61.2 kg  Vital Signs: Temp: 97.7 F (36.5 C) (03/10 0949) Temp Source: Oral (03/10 0949) BP: 115/55 (03/10 0949) Pulse Rate: 67 (03/10 0949)  Labs: Recent Labs    04/18/22 0254 04/18/22 [id number] 04/18/22 1429 04/18/22 2327 04/19/22 0629 04/19/22 0827 04/20/22 1005  HGB 11.3*  --   --   --  10.1*  --  10.4*  HCT 36.3*  --   --   --  31.9*  --  33.1*  PLT 139*  --   --   --  133*  --  101*  APTT  --   --  40*  --   --   --   --   LABPROT  --  15.0  --   --   --   --   --   INR  --  1.2  --   --   --   --   --   HEPARINUNFRC  --   --  0.10* 0.18*  --  0.29*  --   CREATININE 1.21  --   --   --  1.07  --  1.22  TROPONINIHS 27*  --   --   --   --   --  15   Estimated Creatinine Clearance: 41 mL/min (by C-G formula based on SCr of 1.22 mg/dL).  Medical History: Past Medical History:  Diagnosis Date   Actinic keratosis    Arrhythmia    A-Fib   BPH (benign prostatic hyperplasia)    Coronary artery disease 02/11/2007   CABG X 3  @ [facility]   CVA (cerebral infarction) 02/11/2012   Diabetes mellitus without complication (HCC)    Enlarged prostate    GERD (gastroesophageal reflux disease)    Hyperlipidemia    Hypertension    Kidney stone    Renal disorder    Skin cancer    scalp, tx by Dr Sharlett Iles 2000s?    Assessment: Pt is a 89 yo male presenting to ED c/o abdominal pain with n/v, reporting receiving tx for liver cancer, found "positive for segmental right upper lobe pulmonary emboli." Patient was discharge yesterday afternoon after transition from heparin infusion to Eliquis and receiving 1st dose  prior to discharge. Patient to presents with persistent abd pain and placed on NPO diet. Pharmacy consulted to resume heparin infusion for PE treatment.   Per patient hx his last Eliquis dose was yesterday 3/9 at 22:00  *Baseline aPTT and heparin ordered. Plt = 101, Hgb 10.4*  Results 3/10 @ 1239 aPTT=37 HL=>1.10   Goal of Therapy:  Heparin level 0.3-0.7 units/ml Monitor platelets by anticoagulation protocol: Yes    Plan: NO initial bolus due to last DOAc dose <24hrs ago. Start heparin infusion to 1200 units/hr  Will check aptt 8hrs after initiating heparin infusion.  CBC daily while on heparin   Irie Fiorello Rodriguez-Guzman PharmD, BCPS 04/20/2022 12:45 PM

## 2022-04-20 NOTE — Progress Notes (Signed)
       CROSS COVER NOTE  NAME: LOKI WUTHRICH MRN: [id number] DOB : 02/14/1927 ATTENDING PHYSICIAN: Ivor Costa, MD    Date of Service   04/20/2022   HPI/Events of Note   Report ***/ On Review of chart *** VBG 1841, PE 04/18/22 Bedside eval*** HPI***  Interventions   Assessment/Plan:   Hypoxia  X X    *** professional thanks      To reach the provider On-Call:   7AM- 7PM see care teams to locate the attending and reach out to them via [email or web address] Password: [email or web address] 7PM-7AM contact night-coverage If you still have difficulty reaching the appropriate provider, please page the [facility] (Director on Call) for [facility] on amion for assistance  This document was prepared using Systems analyst and may include unintentional dictation errors.  Neomia Glass DNP, MBA, FNP-BC, PMHNP-BC Nurse Practitioner [facility] Pager [phone]

## 2022-04-20 NOTE — ED Triage Notes (Signed)
Pt via POV from home. Pt states that he was discharged yesterday from the hospital with a blood clot and was prescribed Eliquis. States since he left from the hospital he has been having generalized abd pain and NV since he was discharged. Denies any diarrhea. Pt states that he feel "too sick." Pt is A&Ox4 and NAD

## 2022-04-21 ENCOUNTER — Encounter: Payer: Self-pay | Admitting: Internal Medicine

## 2022-04-21 DIAGNOSIS — Z515 Encounter for palliative care: Secondary | ICD-10-CM

## 2022-04-21 DIAGNOSIS — I10 Essential (primary) hypertension: Secondary | ICD-10-CM | POA: Diagnosis not present

## 2022-04-21 DIAGNOSIS — E119 Type 2 diabetes mellitus without complications: Secondary | ICD-10-CM | POA: Diagnosis not present

## 2022-04-21 DIAGNOSIS — I2699 Other pulmonary embolism without acute cor pulmonale: Secondary | ICD-10-CM

## 2022-04-21 DIAGNOSIS — C220 Liver cell carcinoma: Secondary | ICD-10-CM | POA: Diagnosis not present

## 2022-04-21 DIAGNOSIS — C9110 Chronic lymphocytic leukemia of B-cell type not having achieved remission: Secondary | ICD-10-CM | POA: Diagnosis not present

## 2022-04-21 DIAGNOSIS — E871 Hypo-osmolality and hyponatremia: Secondary | ICD-10-CM

## 2022-04-21 DIAGNOSIS — Z7901 Long term (current) use of anticoagulants: Secondary | ICD-10-CM

## 2022-04-21 DIAGNOSIS — R7989 Other specified abnormal findings of blood chemistry: Secondary | ICD-10-CM | POA: Diagnosis not present

## 2022-04-21 LAB — CBG MONITORING, ED: Glucose-Capillary: 219 mg/dL — ABNORMAL HIGH (ref 70–99)

## 2022-04-21 LAB — GLUCOSE, CAPILLARY
Glucose-Capillary: 235 mg/dL — ABNORMAL HIGH (ref 70–99)
Glucose-Capillary: 217 mg/dL — ABNORMAL HIGH (ref 70–99)
Glucose-Capillary: 161 mg/dL — ABNORMAL HIGH (ref 70–99)
Glucose-Capillary: 190 mg/dL — ABNORMAL HIGH (ref 70–99)

## 2022-04-21 LAB — COMPREHENSIVE METABOLIC PANEL
ALT: 89 U/L — ABNORMAL HIGH (ref 0–44)
AST: 122 U/L — ABNORMAL HIGH (ref 15–41)
Albumin: 2.9 g/dL — ABNORMAL LOW (ref 3.5–5.0)
Alkaline Phosphatase: 422 U/L — ABNORMAL HIGH (ref 38–126)
Anion gap: 11 (ref 5–15)
BUN: 22 mg/dL (ref 8–23)
CO2: 22 mmol/L (ref 22–32)
Calcium: 8.7 mg/dL — ABNORMAL LOW (ref 8.9–10.3)
Chloride: 94 mmol/L — ABNORMAL LOW (ref 98–111)
Creatinine, Ser: 1.1 mg/dL (ref 0.61–1.24)
GFR, Estimated: >60 mL/min (ref >60)
Glucose, Bld: 233 mg/dL — ABNORMAL HIGH (ref 70–99)
Potassium: 4.3 mmol/L (ref 3.5–5.1)
Sodium: 127 mmol/L — ABNORMAL LOW (ref 135–145)
Total Bilirubin: 5.7 mg/dL — ABNORMAL HIGH (ref 0.3–1.2)
Total Protein: 7.9 g/dL (ref 6.5–8.1)

## 2022-04-21 LAB — CBC
HCT: 29.9 % — ABNORMAL LOW (ref 39.0–52.0)
Hemoglobin: 9.6 g/dL — ABNORMAL LOW (ref 13.0–17.0)
MCH: 26.2 pg (ref 26.0–34.0)
MCHC: 32.1 g/dL (ref 30.0–36.0)
MCV: 81.7 fL (ref 80.0–100.0)
Platelets: 122 10*3/uL — ABNORMAL LOW (ref 150–400)
RBC: 3.66 MIL/uL — ABNORMAL LOW (ref 4.22–5.81)
RDW: 22.4 % — ABNORMAL HIGH (ref 11.5–15.5)
WBC: 24.4 10*3/uL — ABNORMAL HIGH (ref 4.0–10.5)
nRBC: 0.1 % (ref 0.0–0.2)

## 2022-04-21 LAB — HEPARIN LEVEL (UNFRACTIONATED): Heparin Unfractionated: >1.1 IU/mL — ABNORMAL HIGH (ref 0.30–0.70)

## 2022-04-21 LAB — MRSA NEXT GEN BY PCR, NASAL: MRSA by PCR Next Gen: NOT DETECTED

## 2022-04-21 LAB — AMMONIA: Ammonia: 19 umol/L (ref 9–35)

## 2022-04-21 LAB — SODIUM: Sodium: 129 mmol/L — ABNORMAL LOW (ref 135–145)

## 2022-04-21 LAB — APTT: aPTT: 71 seconds — ABNORMAL HIGH (ref 24–36)

## 2022-04-21 LAB — PATHOLOGIST SMEAR REVIEW

## 2022-04-21 MED ORDER — ORAL CARE MOUTH RINSE
15.0000 mL | OROMUCOSAL | Status: DC
  Administered 2022-04-21 – 2022-04-23 (×8): 15 mL via OROMUCOSAL

## 2022-04-21 MED ORDER — ORAL CARE MOUTH RINSE: 15.0000 mL | OROMUCOSAL | Status: DC | PRN

## 2022-04-21 MED ORDER — CHLORHEXIDINE GLUCONATE CLOTH 2 % EX PADS
6.0000 | MEDICATED_PAD | Freq: Every day | CUTANEOUS | Status: DC
  Administered 2022-04-21: 6 via TOPICAL

## 2022-04-21 NOTE — Assessment & Plan Note (Signed)
Recently diagnosed. WBC 15.7 on admission.  Per Dr. Elroy Channel clinic note on 2/27, this can be treated conservatively. -Oncology consulted -Monitor CBC

## 2022-04-21 NOTE — Assessment & Plan Note (Signed)
Present on admission. Palliative care consult for [place] discussions in light of progressive metastatic HCC and also CLL.

## 2022-04-21 NOTE — Assessment & Plan Note (Signed)
-  Amlodipine, lisinopril, metoprolol -IV hydralazine as needed

## 2022-04-21 NOTE — ED Notes (Signed)
Report given to Kem Kays, RN

## 2022-04-21 NOTE — Progress Notes (Signed)
Patient arrived on floor, oriented to room and unit.  Patient alert, oriented x4, denies discomfort.

## 2022-04-21 NOTE — Assessment & Plan Note (Signed)
Stable -Follow-up with BMP 

## 2022-04-21 NOTE — Progress Notes (Signed)
[place] for heparin infusion Indication: pulmonary embolus  Allergies  Allergen Reactions   Ibuprofen Swelling   Penicillins Swelling and Hives    lips Other reaction(s): Other (See Comments) Edema (lips)    Patient Measurements: Height: 6' (182.9 cm) Weight: 75 kg (165 lb 5.5 oz) IBW/kg (Calculated) : 77.6 Heparin Dosing Weight: 61.2 kg  Vital Signs: BP: 142/68 (03/11 0600) Pulse Rate: 69 (03/11 0600)  Labs: Recent Labs    04/19/22 0629 04/19/22 0827 04/20/22 1005 04/20/22 1214 04/20/22 1239 04/20/22 2122 04/21/22 0538  HGB 10.1*  --  10.4*  --   --   --  9.6*  HCT 31.9*  --  33.1*  --   --   --  29.9*  PLT 133*  --  101*  --   --   --  122*  APTT  --   --   --   --  37* 72* 71*  LABPROT  --   --   --   --  19.4*  --   --   INR  --   --   --   --  1.7*  --   --   HEPARINUNFRC  --  0.29*  --   --  >1.10*  --  >1.10*  CREATININE 1.07  --  1.22  --   --   --  1.10  TROPONINIHS  --   --  15 15  --   --   --     Estimated Creatinine Clearance: 45.5 mL/min (by C-G formula based on SCr of 1.1 mg/dL).  Medical History: Past Medical History:  Diagnosis Date   Actinic keratosis    Arrhythmia    A-Fib   BPH (benign prostatic hyperplasia)    Coronary artery disease 02/11/2007   CABG X 3  @ [facility]   CVA (cerebral infarction) 02/11/2012   Diabetes mellitus without complication (HCC)    Enlarged prostate    GERD (gastroesophageal reflux disease)    Hyperlipidemia    Hypertension    Kidney stone    Renal disorder    Skin cancer    scalp, tx by Dr Sharlett Iles 2000s?    Assessment: Pt is a 89 yo male presenting to ED c/o abdominal pain with n/v, reporting receiving tx for liver cancer, found "positive for segmental right upper lobe pulmonary emboli." Patient was discharge yesterday afternoon after transition from heparin infusion to Eliquis and receiving 1st dose prior to discharge. Patient to presents with persistent abd pain and  placed on NPO diet. Pharmacy consulted to resume heparin infusion for PE treatment.   Per patient hx his last Eliquis dose was yesterday 3/9 at 22:00  *Baseline aPTT and heparin ordered. Plt = 101, Hgb 10.4*  Date Time aPTT/HL Rate/Comment 3/10 1239 37/ >1.10 3/11 2122 72/ ---  Therapeutic x1 / 1200 u/hr 3/11 0538 71 / >1.1 aPTT therapeutic x 2   Goal of Therapy:  Heparin level 0.3-0.7 units/ml Monitor platelets by anticoagulation protocol: Yes    Plan: Continue heparin infusion at 1200 units/hr Check aPTT/Anti-Xa level daily while therapeutic Titrate by aPTT's until lab correlation is noted, then titrate by anti-xa alone. Continue to monitor H&H and platelets daily while on heparin gtt.  Renda Rolls, PharmD, [facility] 04/21/2022 6:43 AM

## 2022-04-21 NOTE — Assessment & Plan Note (Signed)
-   Aspirin, Lipitor  

## 2022-04-21 NOTE — Assessment & Plan Note (Signed)
See Palliative Care consult note/s

## 2022-04-21 NOTE — Assessment & Plan Note (Signed)
Due to [facility].  Monitor. Avoid Tylenol.

## 2022-04-21 NOTE — Consult Note (Cosign Needed Addendum)
[place] at [facility] [address] [phone] [phone]   Name: Randy Frazier Date: 04/21/2022 MRN: [id number]  DOB: Dec 02, 1927  Patient Care Team: Pcp, No as PCP - General Rockey Situ Kathlene November, MD as PCP - Cardiology (Cardiology) Minna Merritts, MD as Consulting Physician (Cardiology) Clent Jacks, RN as Oncology Nurse Navigator Sindy Guadeloupe, MD as Consulting Physician (Oncology) Larri Brewton, Kirt Boys, NP as Nurse Practitioner (Hospice and Palliative Medicine)    REASON FOR CONSULTATION: Randy Frazier is a 89 y.o. male with multiple medical problems including including CAD status post CABG, diabetes, history of CVA, stage IIIa multifocal hepatocellular carcinoma.  Patient was found to have a hepatic mass on CT/MRI in October 2023 after evaluation for abdominal pain.  He was subsequently hospitalized in November 2023 with sepsis secondary to acute cholecystitis.  He is status post cholecystectomy and liver biopsy.  Patient incidentally found to have CLL.  He was referred to [facility] for consideration of chemotherapy but was hospitalized 04/18/2022 to 04/19/2022 with PE.  He was discharged home on Eliquis.  Patient now readmitted 04/20/2022 with abdominal pain.  CT of the abdomen and pelvis shows large tumor burden throughout the liver with possible clot within the intrahepatic biliary tree, extensive upper abdominal and lower mediastinal lymphadenopathy, extensive soft tissue stranding adjacent to the pancreas.  Overall, imaging concerning for interval cancer progression.  Palliative care was consulted to address goals.   SOCIAL HISTORY:     reports that he has never smoked. He has never used smokeless tobacco. He reports that he does not drink alcohol and does not use drugs.  Patient is a widower after being married for 65 years.  Patient has two sons, one of whom lives next-door and the other in the lawn.  Patient has lived in  the same house on [place] since he was born.  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  He is involved in his church and has a Sports coach.  ADVANCE DIRECTIVES:  Not on file  CODE STATUS: DNR/DNI  PAST MEDICAL HISTORY: Past Medical History:  Diagnosis Date   Actinic keratosis    Arrhythmia    A-Fib   BPH (benign prostatic hyperplasia)    Coronary artery disease 02/11/2007   CABG X 3  @ [facility]   CVA (cerebral infarction) 02/11/2012   Diabetes mellitus without complication (HCC)    Enlarged prostate    GERD (gastroesophageal reflux disease)    Hyperlipidemia    Hypertension    Kidney stone    Renal disorder    Skin cancer    scalp, tx by Dr Sharlett Iles 2000s?    PAST SURGICAL HISTORY:  Past Surgical History:  Procedure Laterality Date   cataract surgery Bilateral    CORONARY ARTERY BYPASS GRAFT  2009   CABG X 3 @ [facility]   LIVER BIOPSY  01/06/2022   Procedure: LIVER BIOPSY;  Surgeon: Ronny Bacon, MD;  Location: [facility] ORS;  Service: General;;   TEAR DUCT PROBING Right 05/07/2016   Procedure: Evacuation of hematoma, right peri-orbital bleed.;  Surgeon: Clista Bernhardt, MD;  Location: [place];  Service: Ophthalmology;  Laterality: Right;    HEMATOLOGY/ONCOLOGY HISTORY:  Oncology History  Hepatocellular carcinoma ([place])  01/25/2022 Initial Diagnosis   Hepatocellular carcinoma ([place])   01/25/2022 Cancer Staging   Staging form: Liver, AJCC 8th Edition - Clinical: Stage IIIA (cT3, cN0, cM0) - Signed by Sindy Guadeloupe, MD on 01/25/2022   01/29/2022 -  Chemotherapy   Patient is on Treatment Plan : [place] Atezolizumab q21d       ALLERGIES:  is allergic to ibuprofen and penicillins.  MEDICATIONS:  Current Facility-Administered Medications  Medication  Dose Route Frequency Provider Last Rate Last Admin   amLODipine (NORVASC) tablet 10 mg  10 mg Oral Daily Ivor Costa, MD   10 mg at 04/21/22 1127   aspirin EC tablet 81 mg  81 mg Oral Daily Ivor Costa, MD   81 mg at 04/21/22 0951   atorvastatin (LIPITOR) tablet 10 mg  10 mg Oral Henreitta Cea, MD   10 mg at 04/20/22 1818   Chlorhexidine Gluconate Cloth 2 % PADS 6 each  6 each Topical Daily Ottie Glazier, MD   6 each at 04/21/22 0951   diphenhydrAMINE (BENADRYL) injection 12.5 mg  12.5 mg Intravenous Q8H PRN Ivor Costa, MD   12.5 mg at 04/20/22 2114   ezetimibe (ZETIA) tablet 10 mg  10 mg Oral Daily Ivor Costa, MD   10 mg at 04/20/22 1818   gabapentin (NEURONTIN) capsule 300 mg  300 mg Oral TID Ivor Costa, MD   300 mg at 04/21/22 0951   heparin ADULT infusion 100 units/mL (25000 units/277m)  1,200 Units/hr Intravenous Continuous WLucillie Garfinkel MD 12 mL/hr at 04/21/22 1219 1,200 Units/hr at 04/21/22 1219   hydrALAZINE (APRESOLINE) injection 10 mg  10 mg Intravenous Q2H PRN NIvor Costa MD   10 mg at 04/20/22 1422   hydrOXYzine (ATARAX) tablet 10 mg  10 mg Oral Daily PRN NIvor Costa MD       insulin aspart (novoLOG) injection 0-5 Units  0-5 Units Subcutaneous QHS NIvor Costa MD       insulin aspart (novoLOG) injection 0-9 Units  0-9 Units Subcutaneous TID WC NIvor Costa MD   2 Units at 04/21/22 1217   insulin glargine-yfgn (SEMGLEE) injection 16 Units  16 Units Subcutaneous Daily NIvor Costa MD   16 Units at 04/21/22 0951   lisinopril (ZESTRIL) tablet 20 mg  20 mg Oral Daily NIvor Costa MD   20 mg at 04/21/22 1127   metoprolol tartrate (LOPRESSOR) tablet 25 mg  25 mg Oral BID NIvor Costa MD   25 mg at 04/21/22 1127   morphine (PF) 2 MG/ML injection 0.5 mg  0.5 mg Intravenous Q3H PRN NIvor Costa MD   0.5 mg at 04/21/22 0556   Oral care mouth rinse  15 mL Mouth Rinse 4 times per day AOttie Glazier MD   15 mL at 04/21/22 1130   Oral care mouth rinse  15 mL Mouth Rinse PRN AOttie Glazier MD        oxyCODONE (Oxy IR/ROXICODONE) immediate release tablet 5 mg  5 mg Oral Q4H PRN NIvor Costa MD   5 mg at 04/21/22 [id number]  pantoprazole (PROTONIX) EC tablet 40 mg  40 mg Oral Daily NIvor Costa MD   40 mg at 04/21/22 0951    VITAL SIGNS: BP 121/61   Pulse 76   Temp 98.1 F (36.7 C) (Oral)   Resp (!) 21   Ht 6' (1.829 m)   Wt 160 lb 0.9 oz (72.6 kg)   SpO2 98%   BMI 21.71 kg/m  Filed Weights   04/20/22 0947 04/21/22  [id number]  Weight: 165 lb 5.5 oz (75 kg) 160 lb 0.9 oz (72.6 kg)    Estimated body mass index is 21.71 kg/m as calculated from the following:   Height as of this encounter: 6' (1.829 m).   Weight as of this encounter: 160 lb 0.9 oz (72.6 kg).  LABS: CBC:    Component Value Date/Time   WBC 24.4 (H) 04/21/2022 0538   HGB 9.6 (L) 04/21/2022 0538   HGB 14.6 03/30/2013 0511   HCT 29.9 (L) 04/21/2022 0538   HCT 43.2 03/30/2013 0511   PLT 122 (L) 04/21/2022 0538   PLT 190 03/30/2013 0511   MCV 81.7 04/21/2022 0538   MCV 93 03/30/2013 0511   NEUTROABS 6.4 04/20/2022 1005   NEUTROABS 7.9 (H) 03/30/2013 0511   LYMPHSABS 5.9 (H) 04/20/2022 1005   LYMPHSABS 0.3 (L) 03/30/2013 0511   MONOABS 3.1 (H) 04/20/2022 1005   MONOABS 0.3 03/30/2013 0511   EOSABS 0.1 04/20/2022 1005   EOSABS 0.0 03/30/2013 0511   BASOSABS 0.1 04/20/2022 1005   BASOSABS 0.0 03/30/2013 0511   Comprehensive Metabolic Panel:    Component Value Date/Time   NA 127 (L) 04/21/2022 0538   NA 135 (L) 03/30/2013 0511   K 4.3 04/21/2022 0538   K 4.3 03/30/2013 0511   CL 94 (L) 04/21/2022 0538   CL 103 03/30/2013 0511   CO2 22 04/21/2022 0538   CO2 25 03/30/2013 0511   BUN 22 04/21/2022 0538   BUN 20 (H) 03/30/2013 0511   CREATININE 1.10 04/21/2022 0538   CREATININE 1.54 (H) 03/30/2013 0511   CREATININE 1.23 06/26/2010 0851   GLUCOSE 233 (H) 04/21/2022 0538   GLUCOSE 219 (H) 03/30/2013 0511   CALCIUM 8.7 (L) 04/21/2022 0538   CALCIUM 8.5 03/30/2013 0511   AST 122 (H) 04/21/2022 0538   AST 21  03/30/2013 0511   ALT 89 (H) 04/21/2022 0538   ALT 35 03/30/2013 0511   ALKPHOS 422 (H) 04/21/2022 0538   ALKPHOS 75 03/30/2013 0511   BILITOT 5.7 (H) 04/21/2022 0538   BILITOT 0.5 03/30/2013 0511   PROT 7.9 04/21/2022 0538   PROT 7.7 03/30/2013 0511   ALBUMIN 2.9 (L) 04/21/2022 0538   ALBUMIN 3.7 03/30/2013 0511    RADIOGRAPHIC STUDIES: DG Chest Port 1 View  Result Date: 04/21/2022 CLINICAL DATA:  [id number] Hypoxia [id number] EXAM: PORTABLE CHEST 1 VIEW COMPARISON:  Chest x-ray 01/01/2022, CT abdomen pelvis 04/20/2022, CT angio chest 04/18/2022 FINDINGS: The heart and mediastinal contours are within normal limits. Surgical changes overlie the mediastinum. Bibasilar atelectasis left lower lobe linear atelectasis versus scarring. No focal consolidation. No pulmonary edema. No pleural effusion. No pneumothorax. No acute osseous abnormality.  Intact sternotomy wires. IMPRESSION: No active disease with bibasilar atelectasis. Electronically Signed   By: Iven Finn M.D.   On: 04/21/2022 00:03   CT Abdomen Pelvis W Contrast  Result Date: 04/20/2022 CLINICAL DATA:  89 year old male with presenting with abdominal pain, suspicious for bowel obstruction. EXAM: CT ABDOMEN AND PELVIS WITH CONTRAST TECHNIQUE: Multidetector CT imaging of the abdomen and pelvis was performed using the standard protocol following bolus administration of intravenous contrast. RADIATION DOSE REDUCTION: This exam was performed according to the departmental dose-optimization program which includes automated exposure control, adjustment of the mA and/or kV according to patient size and/or use of iterative reconstruction technique. CONTRAST:  142m OMNIPAQUE IOHEXOL 300 MG/ML  SOLN COMPARISON:  CT of the abdomen and pelvis 04/18/2022. FINDINGS: Lower chest: Scattered areas of scarring and mild fibrosis  in the lung bases bilaterally. Atherosclerotic calcifications in the descending thoracic aorta as well as the left main, left anterior  descending, left circumflex and right coronary arteries. Calcifications of the aortic valve. Calcifications of the mitral prominent enhancing middle mediastinal lymph nodes adjacent to the distal esophagus measuring up to 1.3 cm at the level of the esophageal hiatus (axial image 18 of series 2), likely metastatic. Hepatobiliary: Nodular contour of the liver indicative of advanced cirrhosis. Numerous small poorly defined hypovascular lesions scattered throughout the hepatic parenchyma. In addition, in the left lobe of the liver involving portions of segments 2 and 3 there is a poorly defined infiltrative mass-like area measuring 9.3 x 7.9 cm, similar to the recent prior study. Along the superior margin of this there is a hypervascular component best appreciated on axial image 21 of series 2. There is also a wedge-shaped hypovascular component on axial image 19 of series 2, stable compared to the recent prior study. This entire region is suspicious for infiltrative tumor. In the hepatic hilum extending up into the central liver (axial images 28-32 of series 2 and coronal image 35 of series 5) there is intermediate to high attenuation material (67 HU) which appears to dilate the central hepatic ducts and the proximal aspect of the common hepatic duct (which measures up to 13 mm in diameter on coronal image 35 of series 5), concerning for infiltrative tumor, however, this appearance is new compared to the prior study from only 2 days ago, such that the possibility of clot within the bile ducts warrants consideration. Distal common bile duct tapers to normal caliber. Status post cholecystectomy. Pancreas: No pancreatic mass. No pancreatic ductal dilatation. Peripancreatic soft tissue stranding is noted adjacent to the head and proximal body of the pancreas extending into the root of the small bowel mesentery where there is also trace amount of fluid. No well organized peripancreatic fluid collections are noted. Spleen:  Unremarkable. Adrenals/Urinary Tract: Low-attenuation lesions in both kidneys compatible with simple cysts (no imaging follow-up recommended), largest of which is exophytic extending off the anterior aspect of the lower pole of the right kidney measuring up to 8.9 cm in diameter. No aggressive appearing renal lesions. No hydroureteronephrosis. Urinary bladder is unremarkable in appearance. Right adrenal gland is normal in appearance. 2.0 x 2.2 cm left adrenal nodule, similar to the most recent prior examination, but enlarged compared to more remote prior studies, likely a metastatic lesion. Stomach/Bowel: The appearance of the stomach is normal. There is no pathologic dilatation of small bowel or colon. Numerous colonic diverticula are noted, particularly in the sigmoid colon, without definite focal surrounding inflammatory changes to indicate an acute diverticulitis at this time. Normal appendix. Vascular/Lymphatic: Aortic atherosclerosis, without evidence of aneurysm or dissection in the abdominal or pelvic vasculature. Portal vein is patent. Enlarged portacaval lymph node measuring 2 cm in short axis (axial image 37 of series 2). Enlarged hepatoduodenal ligament lymph nodes measuring up to 1.9 cm in short axis (axial image 33 of series 2). Reproductive: Prostate gland is enlarged and heterogeneous in appearance measuring 5.6 x 5.1 cm. Seminal vesicles are unremarkable in appearance. Other: Trace volume of ascites, decreased compared to the prior study. No pneumoperitoneum. Musculoskeletal: Median sternotomy wires. There are no aggressive appearing lytic or blastic lesions noted in the visualized portions of the skeleton. IMPRESSION: 1. Large burden of infiltrative tumor throughout the left lobe of the liver, as above, with interval prominence of the central intrahepatic bile ducts and the proximal common hepatic duct,  the appearance of which may suggest direct tumor infiltration. However, this is new compared to  prior study from only 2 days ago such that the possibility of clot within the intrahepatic biliary tree may be more likely. Surprisingly, this is not associated with proximal intrahepatic biliary ductal dilatation at this time. 2. Extensive lymphadenopathy in the upper abdomen and lower middle mediastinum, likely metastatic. 3. Extensive soft tissue stranding adjacent to the head and body of the pancreas extending into the root of the small bowel mesentery with trace volume of ascites. Clinical correlation for signs and symptoms of pancreatitis is recommended. 4. Colonic diverticulosis without evidence of acute diverticulitis at this time. 5. Aortic atherosclerosis, in addition to left main and three-vessel coronary artery disease. 6. There are calcifications of the aortic valve. Echocardiographic correlation for evaluation of potential valvular dysfunction may be warranted if clinically indicated. 7. Additional incidental findings, as above. Electronically Signed   By: Vinnie Langton M.D.   On: 04/20/2022 11:53   US Venous Img Lower Bilateral (DVT)  Result Date: 04/18/2022 CLINICAL DATA:  89 year old male with stage III hepatocellular carcinoma. Shortness of breath and right upper lobe pulmonary embolus on CTA this morning. EXAM: BILATERAL LOWER EXTREMITY VENOUS DOPPLER ULTRASOUND TECHNIQUE: Gray-scale sonography with compression, as well as color and duplex ultrasound, were performed to evaluate the deep venous system(s) from the level of the common femoral vein through the popliteal and proximal calf veins. COMPARISON:  CTA Chest, CT Abdomen and Pelvis 0404 hours today. FINDINGS: VENOUS Normal compressibility of the bilateral common femoral, superficial femoral, and popliteal veins, as well as the visualized calf veins. Visualized portions of the bilateral profunda femoral vein and great saphenous vein unremarkable. No filling defects to suggest DVT on grayscale or color Doppler imaging. Doppler waveforms  show normal direction of venous flow, normal respiratory plasticity and response to augmentation. OTHER None. Limitations: none IMPRESSION: No evidence of bilateral lower extremity deep venous thrombosis. Electronically Signed   By: Genevie Ann M.D.   On: 04/18/2022 06:04   CT Angio Chest PE W and/or Wo Contrast  Addendum Date: 04/18/2022   ADDENDUM REPORT: 04/18/2022 04:35 ADDENDUM: Study discussed by telephone with Dr. Vladimir Crofts on 04/18/2022 at 0423 hours. Electronically Signed   By: Genevie Ann M.D.   On: 04/18/2022 04:35   Result Date: 04/18/2022 CLINICAL DATA:  89 year old male with stage III hepatocellular carcinoma. Shortness of breath and hypoxia. Abdominal pain, nausea vomiting. EXAM: CT ANGIOGRAPHY CHEST WITH CONTRAST TECHNIQUE: Multidetector CT imaging of the chest was performed using the standard protocol during bolus administration of intravenous contrast. Multiplanar CT image reconstructions and MIPs were obtained to evaluate the vascular anatomy. RADIATION DOSE REDUCTION: This exam was performed according to the departmental dose-optimization program which includes automated exposure control, adjustment of the mA and/or kV according to patient size and/or use of iterative reconstruction technique. CONTRAST:  7m OMNIPAQUE IOHEXOL 350 MG/ML SOLN COMPARISON:  CT Abdomen and Pelvis today reported separately. CTA chest 01/01/2022. FINDINGS: Cardiovascular: Excellent contrast bolus timing in the pulmonary arterial tree. Segmental right upper lobe pulmonary artery thrombus is confluent and tracks distally in the regional vessels series 5, image 153. No lobar or saddle embolus. No other significant pulmonary artery filling defect. Chronic CABG. Stable mild cardiomegaly. No pericardial effusion. Calcified aortic atherosclerosis. No contrast in the aorta. Mediastinum/Nodes: Multiple maximal mediastinal lymph nodes up to 10 mm short axis do appear mildly increased since November and are indeterminate.  Lungs/Pleura: Major airways remain patent. No right upper  lobe pulmonary infarct. No significant pleural effusion. Bilateral costophrenic angle sub solid and dependent opacity is probably increased atelectasis. Infection is felt less likely due to the degree of symmetry. No suspicious pulmonary nodule identified at this time. Upper Abdomen: CT Abdomen and Pelvis reported separately. Musculoskeletal: Chronic sternotomy. Stable visualized osseous structures. No acute or destructive osseous lesion identified. Review of the MIP images confirms the above findings. IMPRESSION: 1. Positive for Segmental right upper lobe Pulmonary Emboli. No lobar or saddle embolus. No right upper lobe pulmonary infarct or pleural effusion at this time. 2. Borderline to mildly enlarged mediastinal lymph nodes are indeterminate. But no other metastatic disease identified in the chest. 3. CT Abdomen and Pelvis today reported separately. Electronically Signed: By: Genevie Ann M.D. On: 04/18/2022 04:22   CT ABDOMEN PELVIS W CONTRAST  Result Date: 04/18/2022 CLINICAL DATA:  89 year old male with stage III hepatocellular carcinoma. Shortness of breath and hypoxia. Abdominal pain, nausea vomiting. Right upper lobe pulmonary emboli on CTA chest today. EXAM: CT ABDOMEN AND PELVIS WITH CONTRAST TECHNIQUE: Multidetector CT imaging of the abdomen and pelvis was performed using the standard protocol following bolus administration of intravenous contrast. RADIATION DOSE REDUCTION: This exam was performed according to the departmental dose-optimization program which includes automated exposure control, adjustment of the mA and/or kV according to patient size and/or use of iterative reconstruction technique. CONTRAST:  81m OMNIPAQUE IOHEXOL 350 MG/ML SOLN COMPARISON:  CTA chest today reported separately. Noncontrast CT Abdomen and Pelvis 01/27/2022. Abdomen MRI 02/11/2022. FINDINGS: Lower chest: Stable to that reported separately today. Hepatobiliary:  Heterogeneous mass of the left hepatic lobe in the midline, subxiphoid location appears increased in heterogeneity, with central cystic or necrotic change, since December. This distorts the liver capsule. Overall size and configuration probably not significantly changed. Underlying liver nodularity. Evidence of a new or increased 16 mm hypodense lesion in the inferior right hepatic lobe since the January MRI. New perihepatic ascites, small volume. Absent gallbladder. Stable extrahepatic bile ducts. Pancreas: Stable, negative. Spleen: Small volume new perisplenic ascites, otherwise stable. Adrenals/Urinary Tract: Suspicious left adrenal nodule is 1.7 cm now, increased by 3 mm since December. Contralateral right adrenal gland remains normal. Nonobstructed kidneys. Large exophytic but simple fluid density and benign appearing renal cysts (no follow-up imaging recommended). Symmetric renal contrast excretion on the delayed images. No hydroureter. Diminutive bladder. Incidental pelvic phleboliths. Stomach/Bowel: Small volume but largely new abdominal ascites since December with simple fluid density. Chronic diverticulosis of the descending and sigmoid colon. No definite active inflammation. Lesser diverticula of the transverse colon. No convincing acute large bowel inflammation. Appendix appears to remain normal on series 5, image 72. No dilated small bowel. No pneumoperitoneum. Proximal duodenum diverticulum is about 2 cm on series 5, image 44. No associated inflammation. Duodenum and stomach otherwise mostly decompressed. Edema at the root of the small bowel mesentery is similar to December. There is regional mesenteric lymphadenopathy which does not appear significantly changed. Vascular/Lymphatic: Diffuse Aortoiliac calcified atherosclerosis. Major arterial structures in the abdomen and pelvis remain patent. The main portal vein, splenic vein, SMV, and tributaries appear to remain patent. Intrahepatic portal vein  thrombosis is not excluded. Moderate lymphadenopathy in the upper abdomen centered at the gastrohepatic ligament and celiac axis is probably metastatic disease and appears increased since December. Reproductive: Prostatomegaly. Other: Pelvic ascites with simple fluid density is increased. Small to moderate volume. Musculoskeletal: Subtle L2 inferior endplate compression compared to December. No obvious lumbar metastases on the January MRI. And elsewhere spinal bone mineralization  appears stable since December. No other acute or suspicious osseous lesion. IMPRESSION: 1. Metastatic Hepatocellular Carcinoma, mildly progressive CT appearance since December. Small volume new abdominal and pelvic ascites since that time. Dominant left hepatic lobe mass. Questionable new or increased 16 mm right hepatic lobe lesion since the January MRI (series 5, image 44). Gastrohepatic ligament and other upper abdominal lymphadenopathy. 2. Subtle L2 inferior endplate compression fracture appears new since December, but is probably a benign osteoporotic fracture in light of no obvious lumbar metastases by MRI in January. Query new back pain. 3. See abnormal Chest CTA today reported separately. Aortic Atherosclerosis (ICD10-I70.0). Electronically Signed   By: Genevie Ann M.D.   On: 04/18/2022 04:35    PERFORMANCE STATUS (ECOG) : 2 - Symptomatic, <50% confined to bed  Review of Systems Unless otherwise noted, a complete review of systems is negative.  Physical Exam General: NAD Cardiovascular: regular rate and rhythm Pulmonary: clear ant fields Abdomen: soft, nontender, + bowel sounds GU: no suprapubic tenderness Extremities: no edema, no joint deformities Skin: no rashes Neurological: Weakness but otherwise nonfocal  IMPRESSION: Patient seen in the ICU.  Patient's son was at bedside.  Discussed results of imaging suggestive of interval cancer progression.  Patient now has rising transaminases and hyperbilirubinemia.   Patient has been unable to start chemotherapy in the past due to similar findings.  Discussed overall goals and patient stated that he was accepting of his end-of-life whenever that time may come.  He was okay with foregoing further workup or cancer treatment and instead focusing on comfort at home.  Both patient and son verbalized agreement with discharging home with hospice following when medically appropriate.  However, patient is awaiting oncology input prior to making any final decisions.  Symptomatically, patient has had upper abdominal pain likely secondary to tumor burden/metastatic lymphadenopathy.  Recommend continuing opioids as needed for comfort.  Could consider steroids for capsular pain.  PLAN: -Probable home with hospice when medically ready -Patient/family awaiting oncology input -Continue oxycodone as needed for pain -DNR/DNI  Case and plan discussed with Dr. Rogue Bussing  Time Total: 50 minutes  Visit consisted of counseling and education dealing with the complex and emotionally intense issues of symptom management and palliative care in the setting of serious and potentially life-threatening illness.Greater than 50%  of this time was spent counseling and coordinating care related to the above assessment and plan.  Signed by: Altha Harm, PhD, NP-C

## 2022-04-21 NOTE — Progress Notes (Signed)
Placed patient on high flow nasal cannula per NP. Patient was not able to tolerate bipap per NP. Bipap discontinued.

## 2022-04-21 NOTE — Assessment & Plan Note (Signed)
Seems stable without flare symptoms.

## 2022-04-21 NOTE — Assessment & Plan Note (Addendum)
Recent A1c 7.6.  On metformin, Humalog. and Levemir 26-40 units daily at bedtime -Sliding scale insulin -Levemir 16 units at bedtime daily -Gabapentin

## 2022-04-21 NOTE — Assessment & Plan Note (Signed)
Imaging on admission suggests progressive disease, as outlined. --Oncology and Palliative care consulted --Follow up recommendation --Pt has [facility] appt 04/23/22

## 2022-04-21 NOTE — Assessment & Plan Note (Signed)
On PPI 

## 2022-04-21 NOTE — Hospital Course (Signed)
Randy Frazier is a 89 y.o. male with medical history significant of  CAD s/p CABG on DAPT, DM, HTN, HLD, stroke, gout, GERD, depression with anxiety, BPH, CKD-3a, recently diagnosed hepatocellular carcinoma and CLL, thrombocytopenia, recent admission due to acute PE on Eliquis, who presented on 04/20/22 for evaluation of  with nausea, vomiting, severe abdominal pain.  He was referred by oncology to [facility], with initial appointment upcoming on 04/23/22.   ED evaluation notable for WBC 15.7k, afebrile and normal vitals, BP mildly soft 115/55.    CT abd/pelvis showed increased large tumor burden throughout the left lobe of the liver since study 2 days prior, with new prominence of the central intrahepatic bile ducts felt to represent tumor infiltration versus clot.  Extensive lymphadenopathy in upper abdomen, mediastinum likely metastatic.  Extensive soft tissue stranding about the head of the pancreas extending to root of small bowel mesentery with trace ascites.   Patient was admitted to the hospital.  Started on IV heparin in place of Eliquis pending further evaluation, in addition to supportive care for abdominal pain.  Plan is for d/c home with hospice once medically stable.  Consults -- Oncology and Palliative Care

## 2022-04-21 NOTE — Assessment & Plan Note (Signed)
-  Check INR and PTT -Check ammonia level

## 2022-04-21 NOTE — Assessment & Plan Note (Signed)
-  Holding Prozac, trazodone due to QTc prolonged patien

## 2022-04-21 NOTE — Assessment & Plan Note (Signed)
POA, due to severe pain. Resolved. See Hypertension.

## 2022-04-21 NOTE — ED Notes (Signed)
Pt BG=85, snack tray provided, pt declined. Pt's dinner tray is still in room, also untouched. Withholding insulin glargine until pt will eat.

## 2022-04-21 NOTE — Progress Notes (Addendum)
Progress Note   Patient: Randy Frazier [id number] DOB: 1927/02/12 DOA: 04/20/2022     2 DOS: the patient was seen and examined on 04/22/2022   Brief hospital course: SAVAGE ARTRIP is a 89 y.o. male with medical history significant of  CAD s/p CABG on DAPT, DM, HTN, HLD, stroke, gout, GERD, depression with anxiety, BPH, CKD-3a, recently diagnosed hepatocellular carcinoma and CLL, thrombocytopenia, recent admission due to acute PE on Eliquis, who presented on 04/20/22 for evaluation of  with nausea, vomiting, severe abdominal pain.  He was referred by oncology to [facility], with initial appointment upcoming on 04/23/22.   ED evaluation notable for WBC 15.7k, afebrile and normal vitals, BP mildly soft 115/55.    CT abd/pelvis showed increased large tumor burden throughout the left lobe of the liver since study 2 days prior, with new prominence of the central intrahepatic bile ducts felt to represent tumor infiltration versus clot.  Extensive lymphadenopathy in upper abdomen, mediastinum likely metastatic.  Extensive soft tissue stranding about the head of the pancreas extending to root of small bowel mesentery with trace ascites.   Patient was admitted to the hospital.  Started on IV heparin in place of Eliquis pending further evaluation, in addition to supportive care for abdominal pain.  Consults -- Oncology and Palliative Care  Assessment and Plan: * Abdominal pain Due to progressive hepatocellular carcinoma. --Oncology and Palliative Care consults  --Continue IV heparin for now --Hold Eliquis --IV antiemetics PRN --Pain control PRN per orders --Further evaluation pending goals of care discussions  Hypertensive urgency POA, due to severe pain. Resolved. See Hypertension.  Pulmonary embolism ([place]) Recent admission for PE.  On Eliquis. Due to malignancy & hypercoaguable state. --Continue IV heparin for now --Hold Eliquis  Hepatocellular carcinoma ([place]) Imaging on admission  suggests progressive disease, as outlined. --Oncology and Palliative care consulted --Follow up recommendation --Pt has [facility] appt 04/23/22  Hyponatremia Na on admission 131 >> 127 this AM. He got 1 L fluids in the ED yesterday, and has ascites, so suspect this is hypervolemic. --Hold further IV fluids --Monitor BMP --Diuresis PRN and as BP tolerates --Check serum osm, urine osm, urine Na  Acute respiratory failure with hypoxia (HCC) Evening of admission, pt became acutely hypoxic with increased dyspnea.  Bipap was ordered but either not tried or not tolerated (unclear).   Placed on heated HF O2 and admitted to stepdown for close monitoring. 3/11 AM -- pt on 4 L/min [place] O2.  He denies any respiratory symptoms except dyspnea when pain becomes severe.  CXR was negative, showed bibasilar atelectasis. --Supplment O2 PRN to keeps sats > 90%  Abnormal LFTs Due to [facility].  Monitor. Avoid Tylenol.  Chronic kidney disease, stage 3a (HCC) Stable.  -Follow-up with BMP  CLL (chronic lymphocytic leukemia) ([place]) Recently diagnosed. WBC 15.7 on admission.  Per Dr. Elroy Channel clinic note on 2/27, this can be treated conservatively. -Oncology consulted -Monitor CBC  Thrombocytopenia (HCC) Due to liver dysfunction. Monitor CBC  Cirrhosis (HCC) -Check INR and PTT -Check ammonia level  Hypertension -Amlodipine, lisinopril, metoprolol -IV hydralazine as needed  DM type 2 with diabetic peripheral neuropathy (HCC) Recent A1c 7.6.  On metformin, Humalog. and Levemir 26-40 units daily at bedtime -Sliding scale insulin -Levemir 16 units at bedtime daily -Gabapentin  Palliative care encounter See Palliative Care consult note/s  DNR (do not resuscitate) Present on admission. Palliative care consult for [place] discussions in light of progressive metastatic HCC and also CLL.  Depression with anxiety -Holding Prozac,  trazodone due to QTc prolonged patien   GERD (gastroesophageal reflux disease) On  PPI  History of CVA (cerebrovascular accident) -Aspirin, Lipitor   Gout Seems stable without flare symptoms.  CAD, AUTOLOGOUS BYPASS GRAFT Stable, no chest pain.   -Aspirin, Lipitor   Hyperlipidemia -Lipitor 10 mg every other day -Zetia        Subjective: Pt seen in stepdown this AM.  He reports being very thirsty, feels dehydrated.  Currently abdominal pain is controlled, but has been very severe, RUQ.  Abdominal distention he reports is increasing.  He feels sedated from pain medications.   Physical Exam: Vitals:   04/22/22 0002 04/22/22 0454 04/22/22 0455 04/22/22 0734  BP: 130/67 (!) 131/59 (!) 131/59 (!) 162/79  Pulse: 75 77 76 83  Resp: '18 18 18   '$ Temp: 98 F (36.7 C) 98.4 F (36.9 C) 98.4 F (36.9 C) 98.4 F (36.9 C)  TempSrc:    Oral  SpO2: 100% 91% 91% 100%  Weight:      Height:       General exam: awake, alert, no acute distress HEENT: moist mucus membranes, hearing grossly normal  Respiratory system: CTAB diminiished bases, no wheezes, rales or rhonchi, normal respiratory effort. On 4 L/min [place] o2 Cardiovascular system: normal S1/S2, RRR, no pedal edema.   Gastrointestinal system: distended abdomen, RUQ tenderness, +bowel sounds. No peritoneal signs Central nervous system: A&O x4. no gross focal neurologic deficits, normal speech Extremities: moves all, no edema, normal tone Skin: dry, intact, normal temperature Psychiatry: normal mood, congruent affect, judgement and insight appear normal   Data Reviewed:  Notable labs --- CMP with Na 127, Cl 94, glucose 133, Ca 8.7, albumin 2.9, alkphos 422, ast 122, alt 89, tbili 5.7, CBC with wbc 24.4 , platelets 122k, Hbg 9.6  CXR - bibasilar atelectasis but otherwise negative  Family Communication: None present. Will attempt to call. Family also in contact with palliative, oncology.  Disposition: Status is: Inpatient Remains inpatient appropriate because: Ongoing evaluation and severity of illness, on IV  therapies as above.   Planned Discharge Destination: Home, possibly with hospice    Time spent: 42 minutes  Author: Ezekiel Slocumb, DO 04/22/2022 7:54 AM  For on call review [email or web address].

## 2022-04-21 NOTE — Assessment & Plan Note (Addendum)
Evening of admission, pt became acutely hypoxic with increased dyspnea.  Bipap was ordered but either not tried or not tolerated (unclear).   Placed on heated HF O2 and admitted to stepdown for close monitoring. 3/11 AM -- pt on 4 L/min [place] O2.  He denies any respiratory symptoms except dyspnea when pain becomes severe.  CXR was negative, showed bibasilar atelectasis. --Supplment O2 PRN to keeps sats > 90%

## 2022-04-21 NOTE — Assessment & Plan Note (Addendum)
-  Lipitor 10 mg every other day -Zetia

## 2022-04-21 NOTE — Assessment & Plan Note (Signed)
Stable, no chest pain.   -Aspirin, Lipitor

## 2022-04-21 NOTE — Assessment & Plan Note (Signed)
Na on admission 131 >> 127 this AM. He got 1 L fluids in the ED yesterday, and has ascites, so suspect this is hypervolemic. --Hold further IV fluids --Monitor BMP --Diuresis PRN and as BP tolerates --Check serum osm, urine osm, urine Na

## 2022-04-21 NOTE — Assessment & Plan Note (Signed)
Recent admission for PE.  On Eliquis. Due to malignancy & hypercoaguable state. --Transition IV heparin >> Eliquis

## 2022-04-21 NOTE — Consult Note (Signed)
[place]  [phone] [phone]  ID: Randy Frazier OB: January 27, 1928  MR#: [id number]  [id number]  Patient Care Team: Pcp, No as PCP - General Rockey Situ Kathlene November, MD as PCP - Cardiology (Cardiology) Minna Merritts, MD as Consulting Physician (Cardiology) Clent Jacks, RN as Oncology Nurse Navigator Sindy Guadeloupe, MD as Consulting Physician (Oncology) Borders, Kirt Boys, NP as Nurse Practitioner [facility] and Palliative Medicine)  REFERRING PROVIDER: Dr. Jacelyn Grip  REASON FOR REFERRAL: [facility]  HPI: Randy Frazier is a 89 y.o. male with diagnosis of hepatocellular cancer stage III diagnosed in October 2023 when he presented with abdominal pain.  He was subsequently admitted in November 2023 with sepsis secondary to acute cholecystitis.  Patient was seen by Dr. Janese Banks and consideration was for Tecentriq and Bev however patient wanted to continue care at [facility] for which she has appointment on March 13.  He was readmitted on March 8 with PE and was discharged on Eliquis.  He was readmitted on March 10 with abdominal pain.  CT abdomen pelvis from March 8 showed progressive disease.  Repeat CT 2 days later showed interval prominence of central intrahepatic bile ducts and the proximal common hepatic duct the appearance of which may suggest direct tumor infiltration versus possibility of the clot.  Medical oncology was consulted for further management options.  Patient was seen today at bedside accompanied by son.  He is currently on heparin drip.  On pain control with morphine and oxycodone which is helping him a lot.    REVIEW OF SYSTEMS:   ROS  As per HPI. Otherwise, a complete review of systems is negative.  PAST MEDICAL HISTORY: Past Medical History:  Diagnosis Date   Actinic keratosis    Arrhythmia    A-Fib   BPH (benign prostatic hyperplasia)    Coronary artery disease 02/11/2007   CABG X 3  @ [facility]   CVA (cerebral infarction)  02/11/2012   Diabetes mellitus without complication (HCC)    Enlarged prostate    GERD (gastroesophageal reflux disease)    Hyperlipidemia    Hypertension    Kidney stone    Renal disorder    Skin cancer    scalp, tx by Dr Sharlett Iles 2000s?    PAST SURGICAL HISTORY: Past Surgical History:  Procedure Laterality Date   cataract surgery Bilateral    CORONARY ARTERY BYPASS GRAFT  2009   CABG X 3 @ [facility]   LIVER BIOPSY  01/06/2022   Procedure: LIVER BIOPSY;  Surgeon: Ronny Bacon, MD;  Location: [facility] ORS;  Service: General;;   TEAR DUCT PROBING Right 05/07/2016   Procedure: Evacuation of hematoma, right peri-orbital bleed.;  Surgeon: Clista Bernhardt, MD;  Location: [facility];  Service: Ophthalmology;  Laterality: Right;    FAMILY HISTORY: Family History  Family history unknown: Yes    HEALTH MAINTENANCE: Social History   Tobacco Use   Smoking status: Never   Smokeless tobacco: Never  Vaping Use   Vaping Use: Never used  Substance Use Topics   Alcohol use: No    Comment: occasional   Drug use: No     Allergies  Allergen Reactions   Ibuprofen Swelling   Penicillins Swelling and Hives    lips Other reaction(s): Other (See Comments) Edema (lips)    Current Facility-Administered Medications  Medication Dose Route Frequency Provider Last Rate Last Admin   amLODipine (NORVASC) tablet 10 mg  10 mg Oral Daily Ivor Costa, MD   10 mg  at 04/21/22 1127   aspirin EC tablet 81 mg  81 mg Oral Daily Ivor Costa, MD   81 mg at 04/21/22 0951   atorvastatin (LIPITOR) tablet 10 mg  10 mg Oral Henreitta Cea, MD   10 mg at 04/20/22 1818   Chlorhexidine Gluconate Cloth 2 % PADS 6 each  6 each Topical Daily Ottie Glazier, MD   6 each at 04/21/22 0951   diphenhydrAMINE (BENADRYL) injection 12.5 mg  12.5 mg Intravenous Q8H PRN Ivor Costa, MD   12.5 mg at 04/20/22 2114   ezetimibe (ZETIA) tablet 10 mg  10 mg Oral Daily Ivor Costa, MD   10 mg at 04/21/22 1544   gabapentin (NEURONTIN)  capsule 300 mg  300 mg Oral TID Ivor Costa, MD   300 mg at 04/21/22 1544   heparin ADULT infusion 100 units/mL (25000 units/234m)  1,200 Units/hr Intravenous Continuous WLucillie Garfinkel MD 12 mL/hr at 04/21/22 1219 1,200 Units/hr at 04/21/22 1219   hydrALAZINE (APRESOLINE) injection 10 mg  10 mg Intravenous Q2H PRN NIvor Costa MD   10 mg at 04/20/22 1422   hydrOXYzine (ATARAX) tablet 10 mg  10 mg Oral Daily PRN NIvor Costa MD       insulin aspart (novoLOG) injection 0-5 Units  0-5 Units Subcutaneous QHS NIvor Costa MD       insulin aspart (novoLOG) injection 0-9 Units  0-9 Units Subcutaneous TID WC NIvor Costa MD   2 Units at 04/21/22 1217   insulin glargine-yfgn (SEMGLEE) injection 16 Units  16 Units Subcutaneous Daily NIvor Costa MD   16 Units at 04/21/22 0951   lisinopril (ZESTRIL) tablet 20 mg  20 mg Oral Daily NIvor Costa MD   20 mg at 04/21/22 1127   metoprolol tartrate (LOPRESSOR) tablet 25 mg  25 mg Oral BID NIvor Costa MD   25 mg at 04/21/22 1127   morphine (PF) 2 MG/ML injection 0.5 mg  0.5 mg Intravenous Q3H PRN NIvor Costa MD   0.5 mg at 04/21/22 0556   Oral care mouth rinse  15 mL Mouth Rinse 4 times per day AOttie Glazier MD   15 mL at 04/21/22 1130   Oral care mouth rinse  15 mL Mouth Rinse PRN AOttie Glazier MD       oxyCODONE (Oxy IR/ROXICODONE) immediate release tablet 5 mg  5 mg Oral Q4H PRN NIvor Costa MD   5 mg at 04/21/22 0642   pantoprazole (PROTONIX) EC tablet 40 mg  40 mg Oral Daily NIvor Costa MD   40 mg at 04/21/22 0951    OBJECTIVE: Vitals:   04/21/22 1200 04/21/22 1300  BP: (!) 143/65 121/61  Pulse: 76   Resp: 20 (!) 21  Temp:  98.1 F (36.7 C)  SpO2: 100% 98%     Body mass index is 21.71 kg/m.      General: Well-developed, well-nourished, no acute distress. Eyes: Pink conjunctiva, anicteric sclera. HEENT: Normocephalic, moist mucous membranes, clear oropharnyx. Lungs: Clear to auscultation bilaterally. Heart: Regular rate and rhythm. No rubs, murmurs, or  gallops. Abdomen: Soft, nontender, nondistended. No organomegaly noted, normoactive bowel sounds. Musculoskeletal: No edema, cyanosis, or clubbing. Neuro: Alert, answering all questions appropriately. Cranial nerves grossly intact. Skin: No rashes or petechiae noted. Psych: Normal affect. Lymphatics: No cervical, calvicular, axillary or inguinal LAD.   LAB RESULTS:  Lab Results  Component Value Date   NA 127 (L) 04/21/2022   K 4.3 04/21/2022   CL 94 (L) 04/21/2022   CO2 22  04/21/2022   GLUCOSE 233 (H) 04/21/2022   BUN 22 04/21/2022   CREATININE 1.10 04/21/2022   CALCIUM 8.7 (L) 04/21/2022   PROT 7.9 04/21/2022   ALBUMIN 2.9 (L) 04/21/2022   AST 122 (H) 04/21/2022   ALT 89 (H) 04/21/2022   ALKPHOS 422 (H) 04/21/2022   BILITOT 5.7 (H) 04/21/2022   GFRNONAA >60 04/21/2022   GFRAA 50 (L) 05/10/2016    Lab Results  Component Value Date   WBC 24.4 (H) 04/21/2022   NEUTROABS 6.4 04/20/2022   HGB 9.6 (L) 04/21/2022   HCT 29.9 (L) 04/21/2022   MCV 81.7 04/21/2022   PLT 122 (L) 04/21/2022    No results found for: "TIBC", "FERRITIN", "IRONPCTSAT"   STUDIES: DG Chest Port 1 View  Result Date: 04/21/2022 CLINICAL DATA:  200808 Hypoxia [id number] EXAM: PORTABLE CHEST 1 VIEW COMPARISON:  Chest x-ray 01/01/2022, CT abdomen pelvis 04/20/2022, CT angio chest 04/18/2022 FINDINGS: The heart and mediastinal contours are within normal limits. Surgical changes overlie the mediastinum. Bibasilar atelectasis left lower lobe linear atelectasis versus scarring. No focal consolidation. No pulmonary edema. No pleural effusion. No pneumothorax. No acute osseous abnormality.  Intact sternotomy wires. IMPRESSION: No active disease with bibasilar atelectasis. Electronically Signed   By: Iven Finn M.D.   On: 04/21/2022 00:03   CT Abdomen Pelvis W Contrast  Result Date: 04/20/2022 CLINICAL DATA:  89 year old male with presenting with abdominal pain, suspicious for bowel obstruction. EXAM: CT  ABDOMEN AND PELVIS WITH CONTRAST TECHNIQUE: Multidetector CT imaging of the abdomen and pelvis was performed using the standard protocol following bolus administration of intravenous contrast. RADIATION DOSE REDUCTION: This exam was performed according to the departmental dose-optimization program which includes automated exposure control, adjustment of the mA and/or kV according to patient size and/or use of iterative reconstruction technique. CONTRAST:  117m OMNIPAQUE IOHEXOL 300 MG/ML  SOLN COMPARISON:  CT of the abdomen and pelvis 04/18/2022. FINDINGS: Lower chest: Scattered areas of scarring and mild fibrosis in the lung bases bilaterally. Atherosclerotic calcifications in the descending thoracic aorta as well as the left main, left anterior descending, left circumflex and right coronary arteries. Calcifications of the aortic valve. Calcifications of the mitral prominent enhancing middle mediastinal lymph nodes adjacent to the distal esophagus measuring up to 1.3 cm at the level of the esophageal hiatus (axial image 18 of series 2), likely metastatic. Hepatobiliary: Nodular contour of the liver indicative of advanced cirrhosis. Numerous small poorly defined hypovascular lesions scattered throughout the hepatic parenchyma. In addition, in the left lobe of the liver involving portions of segments 2 and 3 there is a poorly defined infiltrative mass-like area measuring 9.3 x 7.9 cm, similar to the recent prior study. Along the superior margin of this there is a hypervascular component best appreciated on axial image 21 of series 2. There is also a wedge-shaped hypovascular component on axial image 19 of series 2, stable compared to the recent prior study. This entire region is suspicious for infiltrative tumor. In the hepatic hilum extending up into the central liver (axial images 28-32 of series 2 and coronal image 35 of series 5) there is intermediate to high attenuation material (67 HU) which appears to dilate  the central hepatic ducts and the proximal aspect of the common hepatic duct (which measures up to 13 mm in diameter on coronal image 35 of series 5), concerning for infiltrative tumor, however, this appearance is new compared to the prior study from only 2 days ago, such that the possibility of clot  within the bile ducts warrants consideration. Distal common bile duct tapers to normal caliber. Status post cholecystectomy. Pancreas: No pancreatic mass. No pancreatic ductal dilatation. Peripancreatic soft tissue stranding is noted adjacent to the head and proximal body of the pancreas extending into the root of the small bowel mesentery where there is also trace amount of fluid. No well organized peripancreatic fluid collections are noted. Spleen: Unremarkable. Adrenals/Urinary Tract: Low-attenuation lesions in both kidneys compatible with simple cysts (no imaging follow-up recommended), largest of which is exophytic extending off the anterior aspect of the lower pole of the right kidney measuring up to 8.9 cm in diameter. No aggressive appearing renal lesions. No hydroureteronephrosis. Urinary bladder is unremarkable in appearance. Right adrenal gland is normal in appearance. 2.0 x 2.2 cm left adrenal nodule, similar to the most recent prior examination, but enlarged compared to more remote prior studies, likely a metastatic lesion. Stomach/Bowel: The appearance of the stomach is normal. There is no pathologic dilatation of small bowel or colon. Numerous colonic diverticula are noted, particularly in the sigmoid colon, without definite focal surrounding inflammatory changes to indicate an acute diverticulitis at this time. Normal appendix. Vascular/Lymphatic: Aortic atherosclerosis, without evidence of aneurysm or dissection in the abdominal or pelvic vasculature. Portal vein is patent. Enlarged portacaval lymph node measuring 2 cm in short axis (axial image 37 of series 2). Enlarged hepatoduodenal ligament lymph  nodes measuring up to 1.9 cm in short axis (axial image 33 of series 2). Reproductive: Prostate gland is enlarged and heterogeneous in appearance measuring 5.6 x 5.1 cm. Seminal vesicles are unremarkable in appearance. Other: Trace volume of ascites, decreased compared to the prior study. No pneumoperitoneum. Musculoskeletal: Median sternotomy wires. There are no aggressive appearing lytic or blastic lesions noted in the visualized portions of the skeleton. IMPRESSION: 1. Large burden of infiltrative tumor throughout the left lobe of the liver, as above, with interval prominence of the central intrahepatic bile ducts and the proximal common hepatic duct, the appearance of which may suggest direct tumor infiltration. However, this is new compared to prior study from only 2 days ago such that the possibility of clot within the intrahepatic biliary tree may be more likely. Surprisingly, this is not associated with proximal intrahepatic biliary ductal dilatation at this time. 2. Extensive lymphadenopathy in the upper abdomen and lower middle mediastinum, likely metastatic. 3. Extensive soft tissue stranding adjacent to the head and body of the pancreas extending into the root of the small bowel mesentery with trace volume of ascites. Clinical correlation for signs and symptoms of pancreatitis is recommended. 4. Colonic diverticulosis without evidence of acute diverticulitis at this time. 5. Aortic atherosclerosis, in addition to left main and three-vessel coronary artery disease. 6. There are calcifications of the aortic valve. Echocardiographic correlation for evaluation of potential valvular dysfunction may be warranted if clinically indicated. 7. Additional incidental findings, as above. Electronically Signed   By: Vinnie Langton M.D.   On: 04/20/2022 11:53   US Venous Img Lower Bilateral (DVT)  Result Date: 04/18/2022 CLINICAL DATA:  89 year old male with stage III hepatocellular carcinoma. Shortness of breath  and right upper lobe pulmonary embolus on CTA this morning. EXAM: BILATERAL LOWER EXTREMITY VENOUS DOPPLER ULTRASOUND TECHNIQUE: Gray-scale sonography with compression, as well as color and duplex ultrasound, were performed to evaluate the deep venous system(s) from the level of the common femoral vein through the popliteal and proximal calf veins. COMPARISON:  CTA Chest, CT Abdomen and Pelvis 0404 hours today. FINDINGS: VENOUS Normal compressibility of  the bilateral common femoral, superficial femoral, and popliteal veins, as well as the visualized calf veins. Visualized portions of the bilateral profunda femoral vein and great saphenous vein unremarkable. No filling defects to suggest DVT on grayscale or color Doppler imaging. Doppler waveforms show normal direction of venous flow, normal respiratory plasticity and response to augmentation. OTHER None. Limitations: none IMPRESSION: No evidence of bilateral lower extremity deep venous thrombosis. Electronically Signed   By: Genevie Ann M.D.   On: 04/18/2022 06:04   CT Angio Chest PE W and/or Wo Contrast  Addendum Date: 04/18/2022   ADDENDUM REPORT: 04/18/2022 04:35 ADDENDUM: Study discussed by telephone with Dr. Vladimir Crofts on 04/18/2022 at 0423 hours. Electronically Signed   By: Genevie Ann M.D.   On: 04/18/2022 04:35   Result Date: 04/18/2022 CLINICAL DATA:  89 year old male with stage III hepatocellular carcinoma. Shortness of breath and hypoxia. Abdominal pain, nausea vomiting. EXAM: CT ANGIOGRAPHY CHEST WITH CONTRAST TECHNIQUE: Multidetector CT imaging of the chest was performed using the standard protocol during bolus administration of intravenous contrast. Multiplanar CT image reconstructions and MIPs were obtained to evaluate the vascular anatomy. RADIATION DOSE REDUCTION: This exam was performed according to the departmental dose-optimization program which includes automated exposure control, adjustment of the mA and/or kV according to patient size and/or use of  iterative reconstruction technique. CONTRAST:  50m OMNIPAQUE IOHEXOL 350 MG/ML SOLN COMPARISON:  CT Abdomen and Pelvis today reported separately. CTA chest 01/01/2022. FINDINGS: Cardiovascular: Excellent contrast bolus timing in the pulmonary arterial tree. Segmental right upper lobe pulmonary artery thrombus is confluent and tracks distally in the regional vessels series 5, image 153. No lobar or saddle embolus. No other significant pulmonary artery filling defect. Chronic CABG. Stable mild cardiomegaly. No pericardial effusion. Calcified aortic atherosclerosis. No contrast in the aorta. Mediastinum/Nodes: Multiple maximal mediastinal lymph nodes up to 10 mm short axis do appear mildly increased since November and are indeterminate. Lungs/Pleura: Major airways remain patent. No right upper lobe pulmonary infarct. No significant pleural effusion. Bilateral costophrenic angle sub solid and dependent opacity is probably increased atelectasis. Infection is felt less likely due to the degree of symmetry. No suspicious pulmonary nodule identified at this time. Upper Abdomen: CT Abdomen and Pelvis reported separately. Musculoskeletal: Chronic sternotomy. Stable visualized osseous structures. No acute or destructive osseous lesion identified. Review of the MIP images confirms the above findings. IMPRESSION: 1. Positive for Segmental right upper lobe Pulmonary Emboli. No lobar or saddle embolus. No right upper lobe pulmonary infarct or pleural effusion at this time. 2. Borderline to mildly enlarged mediastinal lymph nodes are indeterminate. But no other metastatic disease identified in the chest. 3. CT Abdomen and Pelvis today reported separately. Electronically Signed: By: HGenevie AnnM.D. On: 04/18/2022 04:22   CT ABDOMEN PELVIS W CONTRAST  Result Date: 04/18/2022 CLINICAL DATA:  89year old male with stage III hepatocellular carcinoma. Shortness of breath and hypoxia. Abdominal pain, nausea vomiting. Right upper lobe  pulmonary emboli on CTA chest today. EXAM: CT ABDOMEN AND PELVIS WITH CONTRAST TECHNIQUE: Multidetector CT imaging of the abdomen and pelvis was performed using the standard protocol following bolus administration of intravenous contrast. RADIATION DOSE REDUCTION: This exam was performed according to the departmental dose-optimization program which includes automated exposure control, adjustment of the mA and/or kV according to patient size and/or use of iterative reconstruction technique. CONTRAST:  718mOMNIPAQUE IOHEXOL 350 MG/ML SOLN COMPARISON:  CTA chest today reported separately. Noncontrast CT Abdomen and Pelvis 01/27/2022. Abdomen MRI 02/11/2022. FINDINGS: Lower chest: Stable  to that reported separately today. Hepatobiliary: Heterogeneous mass of the left hepatic lobe in the midline, subxiphoid location appears increased in heterogeneity, with central cystic or necrotic change, since December. This distorts the liver capsule. Overall size and configuration probably not significantly changed. Underlying liver nodularity. Evidence of a new or increased 16 mm hypodense lesion in the inferior right hepatic lobe since the January MRI. New perihepatic ascites, small volume. Absent gallbladder. Stable extrahepatic bile ducts. Pancreas: Stable, negative. Spleen: Small volume new perisplenic ascites, otherwise stable. Adrenals/Urinary Tract: Suspicious left adrenal nodule is 1.7 cm now, increased by 3 mm since December. Contralateral right adrenal gland remains normal. Nonobstructed kidneys. Large exophytic but simple fluid density and benign appearing renal cysts (no follow-up imaging recommended). Symmetric renal contrast excretion on the delayed images. No hydroureter. Diminutive bladder. Incidental pelvic phleboliths. Stomach/Bowel: Small volume but largely new abdominal ascites since December with simple fluid density. Chronic diverticulosis of the descending and sigmoid colon. No definite active inflammation.  Lesser diverticula of the transverse colon. No convincing acute large bowel inflammation. Appendix appears to remain normal on series 5, image 72. No dilated small bowel. No pneumoperitoneum. Proximal duodenum diverticulum is about 2 cm on series 5, image 44. No associated inflammation. Duodenum and stomach otherwise mostly decompressed. Edema at the root of the small bowel mesentery is similar to December. There is regional mesenteric lymphadenopathy which does not appear significantly changed. Vascular/Lymphatic: Diffuse Aortoiliac calcified atherosclerosis. Major arterial structures in the abdomen and pelvis remain patent. The main portal vein, splenic vein, SMV, and tributaries appear to remain patent. Intrahepatic portal vein thrombosis is not excluded. Moderate lymphadenopathy in the upper abdomen centered at the gastrohepatic ligament and celiac axis is probably metastatic disease and appears increased since December. Reproductive: Prostatomegaly. Other: Pelvic ascites with simple fluid density is increased. Small to moderate volume. Musculoskeletal: Subtle L2 inferior endplate compression compared to December. No obvious lumbar metastases on the January MRI. And elsewhere spinal bone mineralization appears stable since December. No other acute or suspicious osseous lesion. IMPRESSION: 1. Metastatic Hepatocellular Carcinoma, mildly progressive CT appearance since December. Small volume new abdominal and pelvic ascites since that time. Dominant left hepatic lobe mass. Questionable new or increased 16 mm right hepatic lobe lesion since the January MRI (series 5, image 44). Gastrohepatic ligament and other upper abdominal lymphadenopathy. 2. Subtle L2 inferior endplate compression fracture appears new since December, but is probably a benign osteoporotic fracture in light of no obvious lumbar metastases by MRI in January. Query new back pain. 3. See abnormal Chest CTA today reported separately. Aortic  Atherosclerosis (ICD10-I70.0). Electronically Signed   By: Genevie Ann M.D.   On: 04/18/2022 04:35    ASSESSMENT AND PLAN:   Randy Frazier is a 89 y.o. male with pmh of stage IIIb hepatocellular cancer not started treatment readmitted on 04/20/2022 for severe abdominal pain.  CT abdomen pelvis concerning for progressive disease.  He was recently admitted on 04/18/2022 for PE and was discharged on Eliquis.  Considering advanced age, other comorbidities, functional status, multiple hospitalizations and now with progressive disease, he is not a good candidate for systemic treatment.  Patient was evaluated by palliative care and after discussion with the patient and with his son decision was made to consider hospice.  Patient's pain is under control on current pain regimen.    Patient expressed understanding and was in agreement with this plan. He also understands that He can call clinic at any time with any questions, concerns, or complaints.  I spent a total of 60 minutes reviewing chart data, face-to-face evaluation with the patient, counseling and coordination of care as detailed above.  Jane Canary, MD   04/21/2022 3:53 PM

## 2022-04-21 NOTE — Assessment & Plan Note (Signed)
Due to progressive hepatocellular carcinoma. --Oncology and Palliative Care consults  --Transition heparin >> Eliquis 10 mg BID today --IV antiemetics PRN --Pain control PRN per orders

## 2022-04-21 NOTE — Assessment & Plan Note (Signed)
Due to liver dysfunction. Monitor CBC

## 2022-04-22 ENCOUNTER — Other Ambulatory Visit (HOSPITAL_COMMUNITY): Payer: Self-pay

## 2022-04-22 DIAGNOSIS — J9601 Acute respiratory failure with hypoxia: Secondary | ICD-10-CM | POA: Diagnosis not present

## 2022-04-22 DIAGNOSIS — E871 Hypo-osmolality and hyponatremia: Secondary | ICD-10-CM | POA: Diagnosis not present

## 2022-04-22 DIAGNOSIS — C220 Liver cell carcinoma: Secondary | ICD-10-CM | POA: Diagnosis not present

## 2022-04-22 LAB — APTT: aPTT: 55 seconds — ABNORMAL HIGH (ref 24–36)

## 2022-04-22 LAB — GLUCOSE, CAPILLARY
Glucose-Capillary: 168 mg/dL — ABNORMAL HIGH (ref 70–99)
Glucose-Capillary: 210 mg/dL — ABNORMAL HIGH (ref 70–99)
Glucose-Capillary: 143 mg/dL — ABNORMAL HIGH (ref 70–99)
Glucose-Capillary: 211 mg/dL — ABNORMAL HIGH (ref 70–99)

## 2022-04-22 LAB — BASIC METABOLIC PANEL
Anion gap: 9 (ref 5–15)
BUN: 34 mg/dL — ABNORMAL HIGH (ref 8–23)
CO2: 25 mmol/L (ref 22–32)
Calcium: 8.8 mg/dL — ABNORMAL LOW (ref 8.9–10.3)
Chloride: 96 mmol/L — ABNORMAL LOW (ref 98–111)
Creatinine, Ser: 1.13 mg/dL (ref 0.61–1.24)
GFR, Estimated: >60 mL/min (ref >60)
Glucose, Bld: 175 mg/dL — ABNORMAL HIGH (ref 70–99)
Potassium: 4.1 mmol/L (ref 3.5–5.1)
Sodium: 130 mmol/L — ABNORMAL LOW (ref 135–145)

## 2022-04-22 LAB — CBC
HCT: 28.4 % — ABNORMAL LOW (ref 39.0–52.0)
Hemoglobin: 9.5 g/dL — ABNORMAL LOW (ref 13.0–17.0)
MCH: 27 pg (ref 26.0–34.0)
MCHC: 33.5 g/dL (ref 30.0–36.0)
MCV: 80.7 fL (ref 80.0–100.0)
Platelets: 142 10*3/uL — ABNORMAL LOW (ref 150–400)
RBC: 3.52 MIL/uL — ABNORMAL LOW (ref 4.22–5.81)
RDW: 21.8 % — ABNORMAL HIGH (ref 11.5–15.5)
WBC: 15.2 10*3/uL — ABNORMAL HIGH (ref 4.0–10.5)
nRBC: 0 % (ref 0.0–0.2)

## 2022-04-22 LAB — HEPARIN LEVEL (UNFRACTIONATED): Heparin Unfractionated: 0.86 IU/mL — ABNORMAL HIGH (ref 0.30–0.70)

## 2022-04-22 MED ORDER — APIXABAN 5 MG PO TABS: 5.0000 mg | ORAL_TABLET | Freq: Two times a day (BID) | ORAL | Status: DC

## 2022-04-22 MED ORDER — OXYMETAZOLINE HCL 0.05 % NA SOLN
1.0000 | Freq: Two times a day (BID) | NASAL | Status: DC
  Administered 2022-04-22 – 2022-04-23 (×2): 1 via NASAL
  Filled 2022-04-22: qty 15

## 2022-04-22 MED ORDER — APIXABAN 5 MG PO TABS
10.0000 mg | ORAL_TABLET | Freq: Two times a day (BID) | ORAL | Status: DC
  Administered 2022-04-22 – 2022-04-23 (×3): 10 mg via ORAL
  Filled 2022-04-22 (×3): qty 2

## 2022-04-22 MED ORDER — HEPARIN BOLUS VIA INFUSION
1800.0000 [IU] | Freq: Once | INTRAVENOUS | Status: AC
  Administered 2022-04-22: 1800 [IU] via INTRAVENOUS
  Filled 2022-04-22: qty 1800

## 2022-04-22 MED ORDER — APIXABAN 5 MG PO TABS: 10.0000 mg | ORAL_TABLET | Freq: Two times a day (BID) | ORAL | Status: DC

## 2022-04-22 NOTE — Evaluation (Addendum)
Physical Therapy Evaluation Patient Details Name: Randy Frazier MRN: [id number] DOB: [birth year] Today's Date: 04/22/2022  History of Present Illness  89 y.o. male with medical history significant of  CAD s/p CABG on DAPT, DM, HTN, HLD, stroke, gout, GERD, depression with anxiety, BPH, CKD-3a, recently diagnosed hepatocellular carcinoma and CLL, thrombocytopenia, recent admission due to acute PE on Eliquis, who presented on 04/20/22 for evaluation of  with nausea, vomiting, severe abdominal pain.  Clinical Impression  Pt pleasant and eager to walking, stating that he does not, has not and will not need AD.  However on initial attempt at standing up he could not fully extend L knee and displayed decreased stance time/tolerance on L.  He was determined to show that he could do a prolonged walk and did so, however after ~100-125 ft he started having increased buckling and limp on the L; PT forced him to use HHA as he was intent on getting back to his room (not sit).  We ultimately barely made it back to his room with increased limp and buckling with each step down the homestretch.  Despite this seemingly obvious unsafe conclusion to out bout of ambulation pt remains steadfast on his refusal to consider using an AD - despite protestations from this PT that, for now at least, he is unsafe to ambulate w/o it for more than a few feet at at time.  Pt would benefit from continued PT to address safety issues/awareness.     Recommendations for follow up therapy are one component of a multi-disciplinary discharge planning process, led by the attending physician.  Recommendations may be updated based on patient status, additional functional criteria and insurance authorization.  Follow Up Recommendations Home health PT      Assistance Recommended at Discharge    Patient can return home with the following  A little help with walking and/or transfers;A little help with bathing/dressing/bathroom;Help with  stairs or ramp for entrance;Assist for transportation;Assistance with cooking/housework    Equipment Recommendations  (reports he has walker and cane, he genuinely needs to start using them despite his protestations)  Recommendations for Other Services       Functional Status Assessment Patient has had a recent decline in their functional status and demonstrates the ability to make significant improvements in function in a reasonable and predictable amount of time.     Precautions / Restrictions Precautions Precautions: Fall Restrictions Weight Bearing Restrictions: No      Mobility  Bed Mobility Overal bed mobility: Modified Independent                  Transfers Overall transfer level: Needs assistance Equipment used: None Transfers: Sit to/from Stand Sit to Stand: Min guard           General transfer comment: Initially struggled to rise, heavy UE use and need for back of LEs to leverage on bed.  Did not need phyiscal assist and refused AD but lacked the confidence he portrayed on initial suggestion to try getting to standing    Ambulation/Gait Ambulation/Gait assistance: Min assist Gait Distance (Feet): 200 Feet Assistive device: None, 1 person hand held assist         General Gait Details: Pt maintains flexed L hip/knee with decreased WBing/stance on L.  Pt able to maintain limping but consistent and confident cadence ~125 ft, however decreased tolerance with L standing with increased distance and ultimately having consistent buckling and need for PT HHA to struggle back to his room.  Pt  was adamant that he neither needed/wanted to sit or to use an AD.  Pt ultimately very unsafe for the final ~25 ft with progressively decreased ability to keep L LE (knee and hip) from buckling with stance phase.  PT repeated attempt to educate and encouarge walker use at this time yet he insists "Just from laying on it all day" though (spoke with NT who also reports L LE buckling  earlier today)  Stairs            Wheelchair Mobility    Modified Rankin (Stroke Patients Only)       Balance Overall balance assessment: Needs assistance Sitting-balance support: No upper extremity supported Sitting balance-Leahy Scale: Normal     Standing balance support: No upper extremity supported Standing balance-Leahy Scale: Poor Standing balance comment: Pt initially able to maintain static and dynmaic balance in standing, with incerased stance time (and increased pain/fatigue in L LE) he had progressively worse buckling/limp on L needing direct assist to maintain upright on homestretch of walking around the nurses' station                             Pertinent Vitals/Pain Pain Assessment Pain Assessment: Faces Faces Pain Scale: Hurts even more Pain Location: L groin with [place] expects to be discharged to:: Private residence Living Arrangements: Alone Available Help at Discharge: Family;Available PRN/intermittently Type of Home: House Home Access: Stairs to enter Entrance Stairs-Rails: Right;Left Entrance Stairs-Number of Steps: 3-4   Home Layout: One level Home Equipment: Conservation officer, nature (2 wheels);Cane - single point (pt does not use, and is adamant about that)      Prior Function Prior Level of Function : Independent/Modified Independent;Driving             Mobility Comments: reports he still drives, out of home a few times/week ADLs Comments: reports independence, son lives close and can assist PRN     Hand Dominance        Extremity/Trunk Assessment   Upper Extremity Assessment Upper Extremity Assessment: Generalized weakness    Lower Extremity Assessment Lower Extremity Assessment: Generalized weakness (L hip and knee extensions elicit pain, lacks L ankle DF to neutral)       Communication   Communication: No difficulties  Cognition Arousal/Alertness: Awake/alert Behavior During Therapy:  WFL for tasks assessed/performed Overall Cognitive Status: Within Functional Limits for tasks assessed                                          General Comments General comments (skin integrity, edema, etc.): Pt more confident in his standing/gait tolerance than his performance suggested.  Pt adamanat that he does not want to use an AD, however for safety PT strongly recommending    Exercises     Assessment/Plan    PT Assessment Patient needs continued PT services  PT Problem List Decreased strength;Decreased range of motion;Decreased activity tolerance;Decreased balance;Decreased mobility;Decreased safety awareness;Decreased knowledge of use of DME;Pain       PT Treatment Interventions DME instruction;Gait training;Stair training;Functional mobility training;Therapeutic activities;Therapeutic exercise;Balance training;Neuromuscular re-education;Patient/family education    PT Goals (Current goals can be found in the Care Plan section)  Acute Rehab PT Goals Patient Stated Goal: go home PT Goal Formulation: With patient Time For Goal Achievement: 05/05/22 Potential to Achieve Goals: Fair  Frequency Min 2X/week     Co-evaluation               AM-PAC PT "6 Clicks" Mobility  Outcome Measure Help needed turning from your back to your side while in a flat bed without using bedrails?: None Help needed moving from lying on your back to sitting on the side of a flat bed without using bedrails?: None Help needed moving to and from a bed to a chair (including a wheelchair)?: A Little Help needed standing up from a chair using your arms (e.g., wheelchair or bedside chair)?: A Little Help needed to walk in hospital room?: A Lot Help needed climbing 3-5 steps with a railing? : A Lot 6 Click Score: 18    End of Session Equipment Utilized During Treatment: Gait belt Activity Tolerance: Patient limited by pain;Patient limited by fatigue Patient left: with bed  alarm set;with call bell/phone within reach Nurse Communication: Mobility status PT Visit Diagnosis: Other abnormalities of gait and mobility (R26.89);Muscle weakness (generalized) (M62.81);Difficulty in walking, not elsewhere classified (R26.2);Pain Pain - Right/Left: Left Pain - part of body: Hip    Time: [id number] PT Time Calculation (min) (ACUTE ONLY): 17 min   Charges:   PT Evaluation $PT Eval Low Complexity: 1 Low PT Treatments $Gait Training: 8-22 mins        Kreg Shropshire, DPT 04/22/2022, 6:01 PM

## 2022-04-22 NOTE — Progress Notes (Signed)
Progress Note   Patient: Randy Frazier [id number] DOB: [birth year] DOA: 04/20/2022     2 DOS: the patient was seen and examined on 04/22/2022   Brief hospital course: HONORIO TRUNDY is a 89 y.o. male with medical history significant of  CAD s/p CABG on DAPT, DM, HTN, HLD, stroke, gout, GERD, depression with anxiety, BPH, CKD-3a, recently diagnosed hepatocellular carcinoma and CLL, thrombocytopenia, recent admission due to acute PE on Eliquis, who presented on 04/20/22 for evaluation of  with nausea, vomiting, severe abdominal pain.  He was referred by oncology to [facility], with initial appointment upcoming on 04/23/22.   ED evaluation notable for WBC 15.7k, afebrile and normal vitals, BP mildly soft 115/55.    CT abd/pelvis showed increased large tumor burden throughout the left lobe of the liver since study 2 days prior, with new prominence of the central intrahepatic bile ducts felt to represent tumor infiltration versus clot.  Extensive lymphadenopathy in upper abdomen, mediastinum likely metastatic.  Extensive soft tissue stranding about the head of the pancreas extending to root of small bowel mesentery with trace ascites.   Patient was admitted to the hospital.  Started on IV heparin in place of Eliquis pending further evaluation, in addition to supportive care for abdominal pain.  Plan is for d/c home with hospice once medically stable.  Consults -- Oncology and Palliative Care  Assessment and Plan: * Abdominal pain Due to progressive hepatocellular carcinoma. --Oncology and Palliative Care consults  --Transition heparin >> Eliquis 10 mg BID today --IV antiemetics PRN --Pain control PRN per orders   Hypertensive urgency POA, due to severe pain. Resolved. See Hypertension.  Pulmonary embolism ([place]) Recent admission for PE.  On Eliquis. Due to malignancy & hypercoaguable state. --Transition IV heparin >> Eliquis  Hepatocellular carcinoma (HCC) Imaging on admission  suggests progressive disease, as outlined. --Oncology and Palliative care consulted --Follow up recommendation --Pt has [facility] appt 04/23/22  Hyponatremia Na on admission 131 >> 127 this AM. He got 1 L fluids in the ED yesterday, and has ascites, so suspect this is hypervolemic.   3/10 - Na 127, IV fluids held  3/11 - Na improved to 130 --Hold further IV fluids --Monitor BMP --Diuresis PRN and as BP tolerates  Acute respiratory failure with hypoxia (HCC) Evening of admission, pt became acutely hypoxic with increased dyspnea.  Bipap was ordered but either not tried or not tolerated (unclear).   Placed on heated HF O2 and admitted to stepdown for close monitoring. 3/11 AM -- pt on 4 L/min [place] O2.  He denies any respiratory symptoms except dyspnea when pain becomes severe.  CXR was negative, showed bibasilar atelectasis. --Supplment O2 PRN to keeps sats > 90%  Abnormal LFTs Due to [facility].  Monitor. Avoid Tylenol.  Chronic kidney disease, stage 3a (HCC) Stable.  -Follow-up with BMP  CLL (chronic lymphocytic leukemia) ([place]) Recently diagnosed. WBC 15.7 on admission.  Per Dr. Elroy Channel clinic note on 2/27, this can be treated conservatively. -Oncology consulted -Monitor CBC  Thrombocytopenia (HCC) Due to liver dysfunction. Monitor CBC  Cirrhosis (HCC) -Check INR and PTT -Check ammonia level  Hypertension -Amlodipine, lisinopril, metoprolol -IV hydralazine as needed  DM type 2 with diabetic peripheral neuropathy (HCC) Recent A1c 7.6.  On metformin, Humalog. and Levemir 26-40 units daily at bedtime -Sliding scale insulin -Levemir 16 units at bedtime daily -Gabapentin  Palliative care encounter See Palliative Care consult note/s  DNR (do not resuscitate) Present on admission. Palliative care consult for [place] discussions in light  of progressive metastatic HCC and also CLL.  Depression with anxiety -Holding Prozac, trazodone due to QTc prolonged patien   GERD (gastroesophageal  reflux disease) On PPI  History of CVA (cerebrovascular accident) -Aspirin, Lipitor   Gout Seems stable without flare symptoms.  CAD, AUTOLOGOUS BYPASS GRAFT Stable, no chest pain.   -Aspirin, Lipitor   Hyperlipidemia -Lipitor 10 mg every other day -Zetia        Subjective: Pt was sleeping soundly this AM, with hands laying on RUQ of his abdomen, possibly medicated for pain recently.  He woke briefly, denied complaints.     Physical Exam: Vitals:   04/22/22 0002 04/22/22 0454 04/22/22 0455 04/22/22 0734  BP: 130/67 (!) 131/59 (!) 131/59 (!) 162/79  Pulse: 75 77 76 83  Resp: '18 18 18   '$ Temp: 98 F (36.7 C) 98.4 F (36.9 C) 98.4 F (36.9 C) 98.4 F (36.9 C)  TempSrc:    Oral  SpO2: 100% 91% 91% 100%  Weight:      Height:       General exam: somnolent, no acute distress HEENT: moist mucus membranes, hearing grossly normal  Respiratory system: CTAB diminished bases, no wheezes, normal respiratory effort. On 4 L/min [place] o2 Cardiovascular system: normal S1/S2, RRR, no pedal edema.   Gastrointestinal system: distended abdomen, RUQ tenderness, +bowel sounds. No peritoneal signs Central nervous system: exam limited by somnolence Extremities: moves all, no edema, normal tone Skin: dry, intact, normal temperature Psychiatry: exam limited by somnolence    Data Reviewed:  Notable labs --- Na 127>>129>>130, Cl 96, glucose 175, Ca 8.8, CBC with wbc 24.4 >> 15.2 , platelets 122 >> 142k, Hbg 9.6  >> 9.5     Family Communication: updated son by phone this afternoon.  Anticipate medically stable to d/c home with hospice in 1-2 days if labs improved and pain controlled on PO meds. He is speaking with hospice later today.    Disposition: Status is: Inpatient Remains inpatient appropriate because: severity of illness with persistent electrolyte derangements, pain uncontrolled, remains on IV therapies as above.   Planned Discharge Destination: Home, possibly with  hospice    Time spent: 38 minutes  Author: Ezekiel Slocumb, DO 04/22/2022 12:15 PM  For on call review [email or web address].

## 2022-04-22 NOTE — Progress Notes (Signed)
[place] for heparin infusion Indication: pulmonary embolus  Allergies  Allergen Reactions   Ibuprofen Swelling   Penicillins Swelling and Hives    lips Other reaction(s): Other (See Comments) Edema (lips)    Patient Measurements: Height: 6' (182.9 cm) Weight: 72.6 kg (160 lb 0.9 oz) IBW/kg (Calculated) : 77.6 Heparin Dosing Weight: 61.2 kg  Vital Signs: Temp: 98.4 F (36.9 C) (03/12 0455) BP: 131/59 (03/12 0455) Pulse Rate: 76 (03/12 0455)  Labs: Recent Labs    04/19/22 0629 04/19/22 0827 04/20/22 1005 04/20/22 1214 04/20/22 1239 04/20/22 2122 04/21/22 0538 04/22/22 0418  HGB 10.1*  --  10.4*  --   --   --  9.6*  --   HCT 31.9*  --  33.1*  --   --   --  29.9*  --   PLT 133*  --  101*  --   --   --  122*  --   APTT  --    < >  --   --  37* 72* 71* 55*  LABPROT  --   --   --   --  19.4*  --   --   --   INR  --   --   --   --  1.7*  --   --   --   HEPARINUNFRC  --    < >  --   --  >1.10*  --  >1.10* 0.86*  CREATININE 1.07  --  1.22  --   --   --  1.10  --   TROPONINIHS  --   --  15 15  --   --   --   --    < > = values in this interval not displayed.    Estimated Creatinine Clearance: 44 mL/min (by C-G formula based on SCr of 1.1 mg/dL).  Medical History: Past Medical History:  Diagnosis Date   Actinic keratosis    Arrhythmia    A-Fib   BPH (benign prostatic hyperplasia)    Coronary artery disease 02/11/2007   CABG X 3  @ [facility]   CVA (cerebral infarction) 02/11/2012   Diabetes mellitus without complication (HCC)    Enlarged prostate    GERD (gastroesophageal reflux disease)    Hyperlipidemia    Hypertension    Kidney stone    Renal disorder    Skin cancer    scalp, tx by Dr Sharlett Iles 2000s?    Assessment: Pt is a 89 yo male presenting to ED c/o abdominal pain with n/v, reporting receiving tx for liver cancer, found "positive for segmental right upper lobe pulmonary emboli." Patient was discharge yesterday  afternoon after transition from heparin infusion to Eliquis and receiving 1st dose prior to discharge. Patient to presents with persistent abd pain and placed on NPO diet. Pharmacy consulted to resume heparin infusion for PE treatment.   Per patient hx his last Eliquis dose was yesterday 3/9 at 22:00  *Baseline aPTT and heparin ordered. Plt = 101, Hgb 10.4*  Date Time aPTT/HL Rate/Comment 3/10 1239 37/ >1.10 3/11 2122 72/ ---  Therapeutic x1 / 1200 u/hr 3/11 0538 71 / >1.1 aPTT therapeutic x 2  3/12     0418   55 / 0.86          aPTT SUBtherapeutic   Goal of Therapy:  Heparin level 0.3-0.7 units/ml Monitor platelets by anticoagulation protocol: Yes    Plan: 3/12 @ 0418:  aPTT = 55,  HL = 0.86 - aPTT is SUBtherapeutic but HL still elevated from Eliquis PTA - Will order heparin 1800 units IV X 1 bolus and increase drip rate to 1400 units/hr. - Will recheck aPTT 8 hrs after rate change - Will recheck HL on 3/13 with AM labs.   Titrate by aPTT's until lab correlation is noted, then titrate by anti-xa alone. Continue to monitor H&H and platelets daily while on heparin gtt.  Felicha Frayne D 04/22/2022 5:42 AM

## 2022-04-22 NOTE — Progress Notes (Signed)
Manufacturing engineer (ACC)  MSW received referral from PMT provider/Joshua B., NP for hospice services at home after discharge. MSW spoke with son/Randy Frazier to initiate education related to hospice philosophy, services, and team approach to care.  Randy Frazier requests to speak with MSW to coordinate services once he is able to speak with patient privately.   Randy Frazier provided with MSW contact information, TOC/[place] & PMT provider aware.    Please call with any questions/concerns.    Thank you for the opportunity to participate in this patient's care.   Phillis Haggis, MSW [facility] Liaison  [phone]

## 2022-04-23 ENCOUNTER — Encounter: Payer: Self-pay | Admitting: Internal Medicine

## 2022-04-23 DIAGNOSIS — K7460 Unspecified cirrhosis of liver: Secondary | ICD-10-CM

## 2022-04-23 LAB — CBC
HCT: 28 % — ABNORMAL LOW (ref 39.0–52.0)
Hemoglobin: 9.1 g/dL — ABNORMAL LOW (ref 13.0–17.0)
MCH: 26.2 pg (ref 26.0–34.0)
MCHC: 32.5 g/dL (ref 30.0–36.0)
MCV: 80.7 fL (ref 80.0–100.0)
Platelets: 133 10*3/uL — ABNORMAL LOW (ref 150–400)
RBC: 3.47 MIL/uL — ABNORMAL LOW (ref 4.22–5.81)
RDW: 22.3 % — ABNORMAL HIGH (ref 11.5–15.5)
WBC: 13.7 10*3/uL — ABNORMAL HIGH (ref 4.0–10.5)
nRBC: 0 % (ref 0.0–0.2)

## 2022-04-23 LAB — BASIC METABOLIC PANEL
Anion gap: 9 (ref 5–15)
BUN: 36 mg/dL — ABNORMAL HIGH (ref 8–23)
CO2: 23 mmol/L (ref 22–32)
Calcium: 8.5 mg/dL — ABNORMAL LOW (ref 8.9–10.3)
Chloride: 97 mmol/L — ABNORMAL LOW (ref 98–111)
Creatinine, Ser: 1.25 mg/dL — ABNORMAL HIGH (ref 0.61–1.24)
GFR, Estimated: 54 mL/min — ABNORMAL LOW (ref >60)
Glucose, Bld: 170 mg/dL — ABNORMAL HIGH (ref 70–99)
Potassium: 4.1 mmol/L (ref 3.5–5.1)
Sodium: 129 mmol/L — ABNORMAL LOW (ref 135–145)

## 2022-04-23 LAB — GLUCOSE, CAPILLARY: Glucose-Capillary: 175 mg/dL — ABNORMAL HIGH (ref 70–99)

## 2022-04-23 LAB — MAGNESIUM: Magnesium: 1.9 mg/dL (ref 1.7–2.4)

## 2022-04-23 MED ORDER — INSULIN DETEMIR 100 UNIT/ML ~~LOC~~ SOLN: 26.0000 [IU] | Freq: Every day | SUBCUTANEOUS | Status: DC

## 2022-04-23 NOTE — TOC Progression Note (Signed)
Transition of Care [facility]) - Progression Note    Patient Details  Name: Randy Frazier MRN: [id number] Date of Birth: 11/12/1927  [facility]) CM/SW Contact  Gerilyn Pilgrim, LCSW Phone Number: 04/23/2022, 9:15 AM  Clinical Narrative:   CSW spoke with son to explain that his fathers Elliquis is 100% covered under his hospice benefit. Son reports he is in agreement for his father to discharge home with home hospice through [place].          Expected Discharge Plan and Services                                               Social Determinants of Health (SDOH) Interventions SDOH Screenings   Food Insecurity: No Food Insecurity (04/21/2022)  Housing: Low Risk  (04/21/2022)  Transportation Needs: No Transportation Needs (04/21/2022)  Utilities: Not At Risk (04/21/2022)  Tobacco Use: Low Risk  (04/23/2022)    Readmission Risk Interventions    01/03/2022    9:38 AM  Readmission Risk Prevention Plan  Transportation Screening Complete  PCP or Specialist Appt within 3-5 Days Complete  HRI or [place] Complete  Social Work Consult for [place] Planning/Counseling Complete  Palliative Care Screening Not Applicable  Medication Review Press photographer) Complete

## 2022-04-23 NOTE — TOC Transition Note (Signed)
Transition of Care [facility]) - CM/SW Discharge Note   Patient Details  Name: Randy Frazier MRN: [id number] Date of Birth: July 26, 1927  Transition of [facility]) CM/SW Contact:  Gerilyn Pilgrim, LCSW Phone Number: 04/23/2022, 11:30 AM   Clinical Narrative:   Pt discharging home with home hospice services through [facility]. Pt given letter from hospice stating his Elliquis would be paid for. No additional needs at this time. CSW signing off.           Patient Goals and CMS Choice      Discharge Placement                         Discharge Plan and Services Additional resources added to the After Visit Summary for                                       Social Determinants of Health (SDOH) Interventions SDOH Screenings   Food Insecurity: No Food Insecurity (04/21/2022)  Housing: Low Risk  (04/21/2022)  Transportation Needs: No Transportation Needs (04/21/2022)  Utilities: Not At Risk (04/21/2022)  Tobacco Use: Low Risk  (04/23/2022)     Readmission Risk Interventions    01/03/2022    9:38 AM  Readmission Risk Prevention Plan  Transportation Screening Complete  PCP or Specialist Appt within 3-5 Days Complete  HRI or [place] Complete  Social Work Consult for [place] Planning/Counseling Complete  Palliative Care Screening Not Applicable  Medication Review Press photographer) Complete

## 2022-04-23 NOTE — Discharge Summary (Signed)
Physician Discharge Summary   Patient: Randy Frazier MRN: [id number] DOB: [birth year]  Admit date:     04/20/2022  Discharge date: 04/23/22  Discharge Physician: Jennye Boroughs   PCP: Pcp, No   Recommendations at discharge:    Follow up with PCP in 1 to 2 weeks  Follow-up with hospice team within 24 hours of discharge  Discharge Diagnoses: Principal Problem:   Abdominal pain Active Problems:   Acute respiratory failure with hypoxia ([place])   Hyponatremia   Hepatocellular carcinoma (HCC)   Pulmonary embolism (HCC)   Hypertensive urgency   DM type 2 with diabetic peripheral neuropathy (HCC)   Hypertension   Cirrhosis (HCC)   Thrombocytopenia (HCC)   CLL (chronic lymphocytic leukemia) (HCC)   Chronic kidney disease, stage 3a (HCC)   Abnormal LFTs   Hyperlipidemia   CAD, AUTOLOGOUS BYPASS GRAFT   Gout   History of CVA (cerebrovascular accident)   GERD (gastroesophageal reflux disease)   Depression with anxiety   DNR (do not resuscitate)   Palliative care encounter  Resolved Problems:   * No resolved hospital problems. [facility] Course:  Randy Frazier is a 89 y.o. male with medical history significant of  CAD s/p CABG, history of stroke, DM, HTN, HLD, stroke, gout, GERD, depression with anxiety, BPH, CKD-3a, left cirrhosis, recently diagnosed hepatocellular carcinoma and CLL, thrombocytopenia, recent admission due to acute PE on Eliquis.  He presented to the hospital because of nausea, vomiting and abdominal pain.  CT abdomen and pelvis showed increased large tumor burden throughout the left lobe of the liver compared to yesterday 2 days prior.  There was also evidence of extensive lymphadenopathy in the upper abdomen, mediastinal that was suspected to be metastatic.  Abdominal pain was attributed to progressive hepatocellular carcinoma.  He was evaluated by the oncologist.  He was deemed not to be a candidate for chemotherapy because of progressive disease,  advanced age, poor functional status, multiple comorbidities and multiple hospitalizations.   Hospital course was complicated by acute hypoxic respiratory failure with oxygen saturation down to 82%.  He was transferred to stepdown unit for close monitoring.  He was treated with oxygen via heated humidified high flow nasal cannula.  Acute respiratory failure has resolved and he is tolerating room air.  Etiology of respiratory failure was not clear.   She was evaluated by the palliative care team and hospice team.  He decided to enroll in hospice at home.  He feels better and wants to be discharged home today. Discharge plan discussed with the patient and his son at the bedside.  We went over all medications.  We completed discharge med rec together. He told me that the medicines that he was still taking and the ones that he no longer takes.  He also wanted me to confirm with hospice that his Eliquis prescription will be covered by hospice at discharge. Lorayne Bender Junious confirmed via secure chat that Eliquis will be covered by hospice.         Consultants: Oncologist Procedures performed: None  Disposition:  Home with hospice Diet recommendation:  Discharge Diet Orders (From admission, onward)     Start     Ordered   04/23/22 0000  Diet - low sodium heart healthy        04/23/22 1111           Cardiac diet DISCHARGE MEDICATION: Allergies as of 04/23/2022       Reactions   Ibuprofen Swelling   Penicillins  Swelling, Hives   lips Other reaction(s): Other (See Comments) Edema (lips)        Medication List     STOP taking these medications    FLUoxetine 20 MG capsule Commonly known as: PROZAC   lidocaine-prilocaine cream Commonly known as: EMLA   metFORMIN 500 MG 24 hr tablet Commonly known as: GLUCOPHAGE-XR   nystatin 100000 UNIT/ML suspension Commonly known as: MYCOSTATIN   ondansetron 8 MG tablet Commonly known as: Zofran   oxyCODONE 5 MG immediate release  tablet Commonly known as: Oxy IR/ROXICODONE   traZODone 50 MG tablet Commonly known as: DESYREL       TAKE these medications    amLODipine 10 MG tablet Commonly known as: NORVASC TAKE 1 TABLET BY MOUTH EVERY DAY   Apixaban Starter Pack ('10mg'$  and '5mg'$ ) Commonly known as: ELIQUIS STARTER PACK Take as directed on package: start with two-'5mg'$  tablets twice daily for 7 days. On day 8, switch to one-'5mg'$  tablet twice daily.   apixaban 5 MG Tabs tablet Commonly known as: ELIQUIS Take 1 tablet (5 mg total) by mouth 2 (two) times daily. Start taking on: May 19, 2022   aspirin EC 81 MG tablet Take 81 mg by mouth.   atorvastatin 10 MG tablet Commonly known as: LIPITOR Take 1 tablet (10 mg total) by mouth every other day.   ezetimibe 10 MG tablet Commonly known as: ZETIA Take 10 mg by mouth daily.   gabapentin 300 MG capsule Commonly known as: NEURONTIN Take 300 mg by mouth 3 (three) times daily.   HumaLOG 100 UNIT/ML injection Generic drug: insulin lispro Inject 14 Units into the skin 2 (two) times daily.   hydrOXYzine 10 MG tablet Commonly known as: ATARAX Take 1 tablet (10 mg total) by mouth daily as needed for itching.   insulin detemir 100 UNIT/ML injection Commonly known as: LEVEMIR Inject 0.26 mLs (26 Units total) into the skin daily.   lisinopril 20 MG tablet Commonly known as: ZESTRIL Take 20 mg by mouth daily.   Magnesium 500 MG Tabs Take 500 mg by mouth daily.   metoprolol tartrate 25 MG tablet Commonly known as: LOPRESSOR Take 25 mg by mouth 2 (two) times daily.   multivitamin with minerals Tabs tablet Take 1 tablet by mouth daily.   pantoprazole 40 MG tablet Commonly known as: PROTONIX Take 1 tablet (40 mg total) by mouth daily.   prochlorperazine 10 MG tablet Commonly known as: COMPAZINE Take 1 tablet (10 mg total) by mouth every 6 (six) hours as needed for nausea or vomiting.        Discharge Exam: Filed Weights   04/20/22 0947 04/21/22  0847  Weight: 75 kg 72.6 kg   GEN: NAD SKIN: No rash EYES: EOMI ENT: MMM CV: RRR PULM: CTA B ABD: soft, distended, NT, +BS CNS: AAO x 3, non focal EXT: No edema or tenderness   Condition at discharge: stable  The results of significant diagnostics from this hospitalization (including imaging, microbiology, ancillary and laboratory) are listed below for reference.   Imaging Studies: DG Chest Port 1 View  Result Date: 04/21/2022 CLINICAL DATA:  [id number] Hypoxia [id number] EXAM: PORTABLE CHEST 1 VIEW COMPARISON:  Chest x-ray 01/01/2022, CT abdomen pelvis 04/20/2022, CT angio chest 04/18/2022 FINDINGS: The heart and mediastinal contours are within normal limits. Surgical changes overlie the mediastinum. Bibasilar atelectasis left lower lobe linear atelectasis versus scarring. No focal consolidation. No pulmonary edema. No pleural effusion. No pneumothorax. No acute osseous abnormality.  Intact sternotomy wires. IMPRESSION:  No active disease with bibasilar atelectasis. Electronically Signed   By: Iven Finn M.D.   On: 04/21/2022 00:03   CT Abdomen Pelvis W Contrast  Result Date: 04/20/2022 CLINICAL DATA:  89 year old male with presenting with abdominal pain, suspicious for bowel obstruction. EXAM: CT ABDOMEN AND PELVIS WITH CONTRAST TECHNIQUE: Multidetector CT imaging of the abdomen and pelvis was performed using the standard protocol following bolus administration of intravenous contrast. RADIATION DOSE REDUCTION: This exam was performed according to the departmental dose-optimization program which includes automated exposure control, adjustment of the mA and/or kV according to patient size and/or use of iterative reconstruction technique. CONTRAST:  191m OMNIPAQUE IOHEXOL 300 MG/ML  SOLN COMPARISON:  CT of the abdomen and pelvis 04/18/2022. FINDINGS: Lower chest: Scattered areas of scarring and mild fibrosis in the lung bases bilaterally. Atherosclerotic calcifications in the descending  thoracic aorta as well as the left main, left anterior descending, left circumflex and right coronary arteries. Calcifications of the aortic valve. Calcifications of the mitral prominent enhancing middle mediastinal lymph nodes adjacent to the distal esophagus measuring up to 1.3 cm at the level of the esophageal hiatus (axial image 18 of series 2), likely metastatic. Hepatobiliary: Nodular contour of the liver indicative of advanced cirrhosis. Numerous small poorly defined hypovascular lesions scattered throughout the hepatic parenchyma. In addition, in the left lobe of the liver involving portions of segments 2 and 3 there is a poorly defined infiltrative mass-like area measuring 9.3 x 7.9 cm, similar to the recent prior study. Along the superior margin of this there is a hypervascular component best appreciated on axial image 21 of series 2. There is also a wedge-shaped hypovascular component on axial image 19 of series 2, stable compared to the recent prior study. This entire region is suspicious for infiltrative tumor. In the hepatic hilum extending up into the central liver (axial images 28-32 of series 2 and coronal image 35 of series 5) there is intermediate to high attenuation material (67 HU) which appears to dilate the central hepatic ducts and the proximal aspect of the common hepatic duct (which measures up to 13 mm in diameter on coronal image 35 of series 5), concerning for infiltrative tumor, however, this appearance is new compared to the prior study from only 2 days ago, such that the possibility of clot within the bile ducts warrants consideration. Distal common bile duct tapers to normal caliber. Status post cholecystectomy. Pancreas: No pancreatic mass. No pancreatic ductal dilatation. Peripancreatic soft tissue stranding is noted adjacent to the head and proximal body of the pancreas extending into the root of the small bowel mesentery where there is also trace amount of fluid. No well  organized peripancreatic fluid collections are noted. Spleen: Unremarkable. Adrenals/Urinary Tract: Low-attenuation lesions in both kidneys compatible with simple cysts (no imaging follow-up recommended), largest of which is exophytic extending off the anterior aspect of the lower pole of the right kidney measuring up to 8.9 cm in diameter. No aggressive appearing renal lesions. No hydroureteronephrosis. Urinary bladder is unremarkable in appearance. Right adrenal gland is normal in appearance. 2.0 x 2.2 cm left adrenal nodule, similar to the most recent prior examination, but enlarged compared to more remote prior studies, likely a metastatic lesion. Stomach/Bowel: The appearance of the stomach is normal. There is no pathologic dilatation of small bowel or colon. Numerous colonic diverticula are noted, particularly in the sigmoid colon, without definite focal surrounding inflammatory changes to indicate an acute diverticulitis at this time. Normal appendix. Vascular/Lymphatic: Aortic atherosclerosis, without  evidence of aneurysm or dissection in the abdominal or pelvic vasculature. Portal vein is patent. Enlarged portacaval lymph node measuring 2 cm in short axis (axial image 37 of series 2). Enlarged hepatoduodenal ligament lymph nodes measuring up to 1.9 cm in short axis (axial image 33 of series 2). Reproductive: Prostate gland is enlarged and heterogeneous in appearance measuring 5.6 x 5.1 cm. Seminal vesicles are unremarkable in appearance. Other: Trace volume of ascites, decreased compared to the prior study. No pneumoperitoneum. Musculoskeletal: Median sternotomy wires. There are no aggressive appearing lytic or blastic lesions noted in the visualized portions of the skeleton. IMPRESSION: 1. Large burden of infiltrative tumor throughout the left lobe of the liver, as above, with interval prominence of the central intrahepatic bile ducts and the proximal common hepatic duct, the appearance of which may  suggest direct tumor infiltration. However, this is new compared to prior study from only 2 days ago such that the possibility of clot within the intrahepatic biliary tree may be more likely. Surprisingly, this is not associated with proximal intrahepatic biliary ductal dilatation at this time. 2. Extensive lymphadenopathy in the upper abdomen and lower middle mediastinum, likely metastatic. 3. Extensive soft tissue stranding adjacent to the head and body of the pancreas extending into the root of the small bowel mesentery with trace volume of ascites. Clinical correlation for signs and symptoms of pancreatitis is recommended. 4. Colonic diverticulosis without evidence of acute diverticulitis at this time. 5. Aortic atherosclerosis, in addition to left main and three-vessel coronary artery disease. 6. There are calcifications of the aortic valve. Echocardiographic correlation for evaluation of potential valvular dysfunction may be warranted if clinically indicated. 7. Additional incidental findings, as above. Electronically Signed   By: Vinnie Langton M.D.   On: 04/20/2022 11:53   US Venous Img Lower Bilateral (DVT)  Result Date: 04/18/2022 CLINICAL DATA:  89 year old male with stage III hepatocellular carcinoma. Shortness of breath and right upper lobe pulmonary embolus on CTA this morning. EXAM: BILATERAL LOWER EXTREMITY VENOUS DOPPLER ULTRASOUND TECHNIQUE: Gray-scale sonography with compression, as well as color and duplex ultrasound, were performed to evaluate the deep venous system(s) from the level of the common femoral vein through the popliteal and proximal calf veins. COMPARISON:  CTA Chest, CT Abdomen and Pelvis 0404 hours today. FINDINGS: VENOUS Normal compressibility of the bilateral common femoral, superficial femoral, and popliteal veins, as well as the visualized calf veins. Visualized portions of the bilateral profunda femoral vein and great saphenous vein unremarkable. No filling defects to  suggest DVT on grayscale or color Doppler imaging. Doppler waveforms show normal direction of venous flow, normal respiratory plasticity and response to augmentation. OTHER None. Limitations: none IMPRESSION: No evidence of bilateral lower extremity deep venous thrombosis. Electronically Signed   By: Genevie Ann M.D.   On: 04/18/2022 06:04   CT Angio Chest PE W and/or Wo Contrast  Addendum Date: 04/18/2022   ADDENDUM REPORT: 04/18/2022 04:35 ADDENDUM: Study discussed by telephone with Dr. Vladimir Crofts on 04/18/2022 at 0423 hours. Electronically Signed   By: Genevie Ann M.D.   On: 04/18/2022 04:35   Result Date: 04/18/2022 CLINICAL DATA:  89 year old male with stage III hepatocellular carcinoma. Shortness of breath and hypoxia. Abdominal pain, nausea vomiting. EXAM: CT ANGIOGRAPHY CHEST WITH CONTRAST TECHNIQUE: Multidetector CT imaging of the chest was performed using the standard protocol during bolus administration of intravenous contrast. Multiplanar CT image reconstructions and MIPs were obtained to evaluate the vascular anatomy. RADIATION DOSE REDUCTION: This exam was performed according  to the departmental dose-optimization program which includes automated exposure control, adjustment of the mA and/or kV according to patient size and/or use of iterative reconstruction technique. CONTRAST:  65m OMNIPAQUE IOHEXOL 350 MG/ML SOLN COMPARISON:  CT Abdomen and Pelvis today reported separately. CTA chest 01/01/2022. FINDINGS: Cardiovascular: Excellent contrast bolus timing in the pulmonary arterial tree. Segmental right upper lobe pulmonary artery thrombus is confluent and tracks distally in the regional vessels series 5, image 153. No lobar or saddle embolus. No other significant pulmonary artery filling defect. Chronic CABG. Stable mild cardiomegaly. No pericardial effusion. Calcified aortic atherosclerosis. No contrast in the aorta. Mediastinum/Nodes: Multiple maximal mediastinal lymph nodes up to 10 mm short axis do  appear mildly increased since November and are indeterminate. Lungs/Pleura: Major airways remain patent. No right upper lobe pulmonary infarct. No significant pleural effusion. Bilateral costophrenic angle sub solid and dependent opacity is probably increased atelectasis. Infection is felt less likely due to the degree of symmetry. No suspicious pulmonary nodule identified at this time. Upper Abdomen: CT Abdomen and Pelvis reported separately. Musculoskeletal: Chronic sternotomy. Stable visualized osseous structures. No acute or destructive osseous lesion identified. Review of the MIP images confirms the above findings. IMPRESSION: 1. Positive for Segmental right upper lobe Pulmonary Emboli. No lobar or saddle embolus. No right upper lobe pulmonary infarct or pleural effusion at this time. 2. Borderline to mildly enlarged mediastinal lymph nodes are indeterminate. But no other metastatic disease identified in the chest. 3. CT Abdomen and Pelvis today reported separately. Electronically Signed: By: HGenevie AnnM.D. On: 04/18/2022 04:22   CT ABDOMEN PELVIS W CONTRAST  Result Date: 04/18/2022 CLINICAL DATA:  89year old male with stage III hepatocellular carcinoma. Shortness of breath and hypoxia. Abdominal pain, nausea vomiting. Right upper lobe pulmonary emboli on CTA chest today. EXAM: CT ABDOMEN AND PELVIS WITH CONTRAST TECHNIQUE: Multidetector CT imaging of the abdomen and pelvis was performed using the standard protocol following bolus administration of intravenous contrast. RADIATION DOSE REDUCTION: This exam was performed according to the departmental dose-optimization program which includes automated exposure control, adjustment of the mA and/or kV according to patient size and/or use of iterative reconstruction technique. CONTRAST:  763mOMNIPAQUE IOHEXOL 350 MG/ML SOLN COMPARISON:  CTA chest today reported separately. Noncontrast CT Abdomen and Pelvis 01/27/2022. Abdomen MRI 02/11/2022. FINDINGS: Lower chest:  Stable to that reported separately today. Hepatobiliary: Heterogeneous mass of the left hepatic lobe in the midline, subxiphoid location appears increased in heterogeneity, with central cystic or necrotic change, since December. This distorts the liver capsule. Overall size and configuration probably not significantly changed. Underlying liver nodularity. Evidence of a new or increased 16 mm hypodense lesion in the inferior right hepatic lobe since the January MRI. New perihepatic ascites, small volume. Absent gallbladder. Stable extrahepatic bile ducts. Pancreas: Stable, negative. Spleen: Small volume new perisplenic ascites, otherwise stable. Adrenals/Urinary Tract: Suspicious left adrenal nodule is 1.7 cm now, increased by 3 mm since December. Contralateral right adrenal gland remains normal. Nonobstructed kidneys. Large exophytic but simple fluid density and benign appearing renal cysts (no follow-up imaging recommended). Symmetric renal contrast excretion on the delayed images. No hydroureter. Diminutive bladder. Incidental pelvic phleboliths. Stomach/Bowel: Small volume but largely new abdominal ascites since December with simple fluid density. Chronic diverticulosis of the descending and sigmoid colon. No definite active inflammation. Lesser diverticula of the transverse colon. No convincing acute large bowel inflammation. Appendix appears to remain normal on series 5, image 72. No dilated small bowel. No pneumoperitoneum. Proximal duodenum diverticulum is about 2  cm on series 5, image 44. No associated inflammation. Duodenum and stomach otherwise mostly decompressed. Edema at the root of the small bowel mesentery is similar to December. There is regional mesenteric lymphadenopathy which does not appear significantly changed. Vascular/Lymphatic: Diffuse Aortoiliac calcified atherosclerosis. Major arterial structures in the abdomen and pelvis remain patent. The main portal vein, splenic vein, SMV, and  tributaries appear to remain patent. Intrahepatic portal vein thrombosis is not excluded. Moderate lymphadenopathy in the upper abdomen centered at the gastrohepatic ligament and celiac axis is probably metastatic disease and appears increased since December. Reproductive: Prostatomegaly. Other: Pelvic ascites with simple fluid density is increased. Small to moderate volume. Musculoskeletal: Subtle L2 inferior endplate compression compared to December. No obvious lumbar metastases on the January MRI. And elsewhere spinal bone mineralization appears stable since December. No other acute or suspicious osseous lesion. IMPRESSION: 1. Metastatic Hepatocellular Carcinoma, mildly progressive CT appearance since December. Small volume new abdominal and pelvic ascites since that time. Dominant left hepatic lobe mass. Questionable new or increased 16 mm right hepatic lobe lesion since the January MRI (series 5, image 44). Gastrohepatic ligament and other upper abdominal lymphadenopathy. 2. Subtle L2 inferior endplate compression fracture appears new since December, but is probably a benign osteoporotic fracture in light of no obvious lumbar metastases by MRI in January. Query new back pain. 3. See abnormal Chest CTA today reported separately. Aortic Atherosclerosis (ICD10-I70.0). Electronically Signed   By: Genevie Ann M.D.   On: 04/18/2022 04:35    Microbiology: Results for orders placed or performed during the hospital encounter of 04/20/22  MRSA Next Gen by PCR, Nasal     Status: None   Collection Time: 04/21/22  8:46 AM   Specimen: Nasal Mucosa; Nasal Swab  Result Value Ref Range Status   MRSA by PCR Next Gen NOT DETECTED NOT DETECTED Final    Comment: (NOTE) The GeneXpert MRSA Assay (FDA approved for NASAL specimens only), is one component of a comprehensive MRSA colonization surveillance program. It is not intended to diagnose MRSA infection nor to guide or monitor treatment for MRSA infections. Test  performance is not FDA approved in patients less than 33 years old. Performed at [facility], [address] [place], [place] [address]     Labs: CBC: Recent Labs  Lab 04/19/22 [phone] 04/20/22 1005 04/21/22 0538 04/22/22 0418 04/23/22 0439  WBC 16.0* 15.7* 24.4* 15.2* 13.7*  NEUTROABS  --  6.4  --   --   --   HGB 10.1* 10.4* 9.6* 9.5* 9.1*  HCT 31.9* 33.1* 29.9* 28.4* 28.0*  MCV 82.6 82.5 81.7 80.7 80.7  PLT 133* 101* 122* 142* [id number]*   Basic Metabolic Panel: Recent Labs  Lab 04/19/22 0623 04/19/22 0629 04/20/22 1005 04/21/22 0538 04/21/22 1623 04/22/22 0847 04/23/22 0439  NA  --  132* 131* 127* 129* 130* 129*  K  --  4.4 3.9 4.3  --  4.1 4.1  CL  --  98 94* 94*  --  96* 97*  CO2  --  '26 25 22  '$ --  25 23  GLUCOSE  --  119* 200* 233*  --  175* 170*  BUN  --  '20 23 22  '$ --  34* 36*  CREATININE  --  1.07 1.22 1.10  --  1.13 1.25*  CALCIUM  --  9.1 8.9 8.7*  --  8.8* 8.5*  MG 1.7  --   --   --   --   --  1.9  Liver Function Tests: Recent Labs  Lab 04/18/22 0254 04/19/22 0623 04/20/22 1005 04/21/22 0538  AST 91* 57* 149* 122*  ALT 65* 46* 98* 89*  ALKPHOS 329* 296* 483* 422*  BILITOT 1.1 1.0 4.2* 5.7*  PROT 8.0 7.6 8.3* 7.9  ALBUMIN 3.1* 3.1* 3.1* 2.9*   CBG: Recent Labs  Lab 04/22/22 0740 04/22/22 1205 04/22/22 1627 04/22/22 2113 04/23/22 0823  GLUCAP 168* 210* 143* 211* 175*    Discharge time spent: greater than 30 minutes.  Signed: Jennye Boroughs, MD [facility] 04/23/2022

## 2022-04-23 NOTE — Progress Notes (Signed)
Manufacturing engineer [facility]) Hospital Liaison Note  Received request from [facility] [place] [place], for hospice services at home after discharge on 3.13 & family requested additional time before proceeding.  MSW touched base with son/Keigen. Family inquired of medication coverage & MSW confirmed with [facility] that Eliquis will be covered. Family would like to proceed. Chart and patient information under review by [facility] physician.    DME needs discussed. Patient has the following equipment in the home: Walker Patient requests the following equipment for delivery: N/A  Address verified and is correct in the chart.   Please send signed and completed DNR home with patient/family. Please provide prescriptions at discharge as needed to ensure ongoing symptom management.    [facility] information and contact numbers given to family & above information shared with TOC.   Please call with any questions/concerns.    Thank you for the opportunity to participate in this patient's care.   Phillis Haggis, MSW [facility] Liaison  [phone]

## 2022-05-12 DEATH — deceased
